# Patient Record
Sex: Female | Born: 1950
Health system: Southern US, Community
[De-identification: ages and names within clinical notes are randomized; demographics above are authoritative.]

## PROBLEM LIST (undated history)

## (undated) DIAGNOSIS — T4145XA Adverse effect of unspecified anesthetic, initial encounter: Secondary | ICD-10-CM

## (undated) DIAGNOSIS — F32A Depression, unspecified: Secondary | ICD-10-CM

## (undated) DIAGNOSIS — B3323 Viral pericarditis: Secondary | ICD-10-CM

## (undated) DIAGNOSIS — I509 Heart failure, unspecified: Secondary | ICD-10-CM

## (undated) DIAGNOSIS — K469 Unspecified abdominal hernia without obstruction or gangrene: Secondary | ICD-10-CM

## (undated) DIAGNOSIS — T8859XA Other complications of anesthesia, initial encounter: Secondary | ICD-10-CM

## (undated) DIAGNOSIS — I10 Essential (primary) hypertension: Secondary | ICD-10-CM

## (undated) DIAGNOSIS — M1990 Unspecified osteoarthritis, unspecified site: Secondary | ICD-10-CM

## (undated) DIAGNOSIS — F329 Major depressive disorder, single episode, unspecified: Secondary | ICD-10-CM

## (undated) DIAGNOSIS — G4730 Sleep apnea, unspecified: Secondary | ICD-10-CM

## (undated) DIAGNOSIS — E039 Hypothyroidism, unspecified: Secondary | ICD-10-CM

## (undated) DIAGNOSIS — F319 Bipolar disorder, unspecified: Secondary | ICD-10-CM

## (undated) DIAGNOSIS — E785 Hyperlipidemia, unspecified: Secondary | ICD-10-CM

## (undated) DIAGNOSIS — F419 Anxiety disorder, unspecified: Secondary | ICD-10-CM

## (undated) HISTORY — DX: Anxiety disorder, unspecified: F41.9

## (undated) HISTORY — DX: Depression, unspecified: F32.A

## (undated) HISTORY — DX: Heart failure, unspecified: I50.9

## (undated) HISTORY — PX: APPENDECTOMY: SHX54

## (undated) HISTORY — DX: Hyperlipidemia, unspecified: E78.5

## (undated) HISTORY — DX: Major depressive disorder, single episode, unspecified: F32.9

## (undated) HISTORY — DX: Unspecified abdominal hernia without obstruction or gangrene: K46.9

## (undated) HISTORY — PX: ABDOMINAL HYSTERECTOMY: SHX81

## (undated) HISTORY — DX: Essential (primary) hypertension: I10

## (undated) HISTORY — DX: Unspecified osteoarthritis, unspecified site: M19.90

## (undated) HISTORY — PX: FOOT SURGERY: SHX648

## (undated) HISTORY — DX: Bipolar disorder, unspecified: F31.9

## (undated) HISTORY — DX: Viral pericarditis: B33.23

---

## 1982-08-22 HISTORY — PX: CHOLECYSTECTOMY: SHX55

## 2003-07-24 ENCOUNTER — Emergency Department (HOSPITAL_COMMUNITY): Admission: EM | Admit: 2003-07-24 | Discharge: 2003-07-24 | Payer: Self-pay

## 2003-07-29 ENCOUNTER — Inpatient Hospital Stay (HOSPITAL_COMMUNITY): Admission: EM | Admit: 2003-07-29 | Discharge: 2003-08-01 | Payer: Self-pay | Admitting: Psychiatry

## 2003-08-06 ENCOUNTER — Other Ambulatory Visit (HOSPITAL_COMMUNITY): Admission: RE | Admit: 2003-08-06 | Discharge: 2003-08-12 | Payer: Self-pay | Admitting: Psychiatry

## 2003-08-25 ENCOUNTER — Emergency Department (HOSPITAL_COMMUNITY): Admission: EM | Admit: 2003-08-25 | Discharge: 2003-08-25 | Payer: Self-pay | Admitting: Emergency Medicine

## 2003-09-08 ENCOUNTER — Encounter: Admission: RE | Admit: 2003-09-08 | Discharge: 2003-09-08 | Payer: Self-pay | Admitting: Family Medicine

## 2003-09-15 ENCOUNTER — Ambulatory Visit (HOSPITAL_BASED_OUTPATIENT_CLINIC_OR_DEPARTMENT_OTHER): Admission: RE | Admit: 2003-09-15 | Discharge: 2003-09-15 | Payer: Self-pay | Admitting: Family Medicine

## 2004-10-21 ENCOUNTER — Encounter: Admission: RE | Admit: 2004-10-21 | Discharge: 2004-10-21 | Payer: Self-pay | Admitting: Family Medicine

## 2005-11-04 ENCOUNTER — Inpatient Hospital Stay (HOSPITAL_COMMUNITY): Admission: EM | Admit: 2005-11-04 | Discharge: 2005-11-05 | Payer: Self-pay | Admitting: Emergency Medicine

## 2005-11-05 ENCOUNTER — Inpatient Hospital Stay (HOSPITAL_COMMUNITY): Admission: AD | Admit: 2005-11-05 | Discharge: 2005-11-08 | Payer: Self-pay | Admitting: Psychiatry

## 2005-11-06 ENCOUNTER — Ambulatory Visit: Payer: Self-pay | Admitting: Psychiatry

## 2006-05-17 ENCOUNTER — Ambulatory Visit: Payer: Self-pay | Admitting: Internal Medicine

## 2006-05-17 ENCOUNTER — Inpatient Hospital Stay (HOSPITAL_COMMUNITY): Admission: EM | Admit: 2006-05-17 | Discharge: 2006-05-21 | Payer: Self-pay | Admitting: Emergency Medicine

## 2006-05-17 ENCOUNTER — Encounter: Payer: Self-pay | Admitting: Cardiology

## 2006-05-22 ENCOUNTER — Encounter: Payer: Self-pay | Admitting: Internal Medicine

## 2006-05-22 ENCOUNTER — Ambulatory Visit: Payer: Self-pay

## 2006-05-23 ENCOUNTER — Ambulatory Visit: Payer: Self-pay | Admitting: *Deleted

## 2006-05-25 ENCOUNTER — Inpatient Hospital Stay (HOSPITAL_COMMUNITY): Admission: EM | Admit: 2006-05-25 | Discharge: 2006-05-30 | Payer: Self-pay | Admitting: Emergency Medicine

## 2006-05-25 ENCOUNTER — Ambulatory Visit: Payer: Self-pay | Admitting: Cardiology

## 2006-05-26 ENCOUNTER — Encounter: Payer: Self-pay | Admitting: Cardiology

## 2006-05-29 ENCOUNTER — Encounter: Payer: Self-pay | Admitting: Cardiovascular Disease

## 2006-06-03 ENCOUNTER — Ambulatory Visit: Payer: Self-pay | Admitting: Internal Medicine

## 2006-06-03 ENCOUNTER — Inpatient Hospital Stay (HOSPITAL_COMMUNITY): Admission: EM | Admit: 2006-06-03 | Discharge: 2006-06-14 | Payer: Self-pay | Admitting: *Deleted

## 2006-06-04 ENCOUNTER — Encounter (INDEPENDENT_AMBULATORY_CARE_PROVIDER_SITE_OTHER): Payer: Self-pay | Admitting: *Deleted

## 2006-06-06 ENCOUNTER — Encounter (INDEPENDENT_AMBULATORY_CARE_PROVIDER_SITE_OTHER): Payer: Self-pay | Admitting: *Deleted

## 2006-06-27 ENCOUNTER — Ambulatory Visit: Payer: Self-pay | Admitting: Internal Medicine

## 2006-07-05 ENCOUNTER — Ambulatory Visit: Payer: Self-pay | Admitting: Internal Medicine

## 2006-07-05 ENCOUNTER — Encounter: Payer: Self-pay | Admitting: Internal Medicine

## 2006-07-05 ENCOUNTER — Ambulatory Visit: Payer: Self-pay

## 2006-07-12 ENCOUNTER — Ambulatory Visit: Payer: Self-pay | Admitting: Internal Medicine

## 2006-07-20 ENCOUNTER — Ambulatory Visit: Payer: Self-pay | Admitting: Internal Medicine

## 2006-07-27 ENCOUNTER — Ambulatory Visit: Payer: Self-pay | Admitting: Internal Medicine

## 2006-09-01 ENCOUNTER — Ambulatory Visit: Payer: Self-pay | Admitting: Internal Medicine

## 2006-09-01 LAB — CONVERTED CEMR LAB
ALT: 16 units/L (ref 0–40)
AST: 18 units/L (ref 0–37)
Albumin: 3.9 g/dL (ref 3.5–5.2)
Alkaline Phosphatase: 92 units/L (ref 39–117)
BUN: 16 mg/dL (ref 6–23)
Basophils Relative: 1.3 % — ABNORMAL HIGH (ref 0.0–1.0)
CO2: 30 meq/L (ref 19–32)
Calcium: 9.6 mg/dL (ref 8.4–10.5)
GFR calc non Af Amer: 70 mL/min
Glomerular Filtration Rate, Af Am: 84 mL/min/{1.73_m2}
Hemoglobin: 13.5 g/dL (ref 12.0–15.0)
MCHC: 33.3 g/dL (ref 30.0–36.0)
Monocytes Absolute: 0.3 10*3/uL (ref 0.2–0.7)
Monocytes Relative: 5.2 % (ref 3.0–11.0)
Platelets: 316 10*3/uL (ref 150–400)
Potassium: 4.5 meq/L (ref 3.5–5.1)
RBC: 5 M/uL (ref 3.87–5.11)
RDW: 17.4 % — ABNORMAL HIGH (ref 11.5–14.6)
Sed Rate: 29 mm/hr — ABNORMAL HIGH (ref 0–25)
Total Bilirubin: 0.7 mg/dL (ref 0.3–1.2)
Total Protein: 7.2 g/dL (ref 6.0–8.3)

## 2007-01-01 ENCOUNTER — Ambulatory Visit: Payer: Self-pay | Admitting: Internal Medicine

## 2007-02-05 ENCOUNTER — Ambulatory Visit: Payer: Self-pay | Admitting: Internal Medicine

## 2007-02-05 ENCOUNTER — Ambulatory Visit: Payer: Self-pay

## 2007-08-21 ENCOUNTER — Ambulatory Visit: Payer: Self-pay | Admitting: Cardiology

## 2007-09-04 ENCOUNTER — Encounter: Payer: Self-pay | Admitting: Internal Medicine

## 2007-09-04 ENCOUNTER — Ambulatory Visit: Payer: Self-pay

## 2007-09-06 ENCOUNTER — Ambulatory Visit: Payer: Self-pay | Admitting: Internal Medicine

## 2007-09-21 ENCOUNTER — Ambulatory Visit: Payer: Self-pay | Admitting: Internal Medicine

## 2008-12-01 ENCOUNTER — Telehealth: Payer: Self-pay | Admitting: Internal Medicine

## 2009-02-13 ENCOUNTER — Telehealth: Payer: Self-pay | Admitting: Internal Medicine

## 2009-02-13 ENCOUNTER — Ambulatory Visit: Payer: Self-pay | Admitting: Cardiology

## 2009-02-13 ENCOUNTER — Encounter: Payer: Self-pay | Admitting: Cardiology

## 2009-02-13 ENCOUNTER — Inpatient Hospital Stay (HOSPITAL_COMMUNITY): Admission: EM | Admit: 2009-02-13 | Discharge: 2009-02-15 | Payer: Self-pay | Admitting: Emergency Medicine

## 2009-02-13 ENCOUNTER — Ambulatory Visit: Payer: Self-pay | Admitting: Internal Medicine

## 2009-02-13 ENCOUNTER — Encounter: Payer: Self-pay | Admitting: Internal Medicine

## 2009-02-14 ENCOUNTER — Encounter: Payer: Self-pay | Admitting: Cardiovascular Disease

## 2009-02-16 ENCOUNTER — Encounter: Payer: Self-pay | Admitting: Cardiology

## 2009-03-02 ENCOUNTER — Telehealth: Payer: Self-pay | Admitting: Internal Medicine

## 2009-05-22 DIAGNOSIS — B3323 Viral pericarditis: Secondary | ICD-10-CM

## 2009-05-22 HISTORY — DX: Viral pericarditis: B33.23

## 2009-06-09 ENCOUNTER — Encounter: Admission: RE | Admit: 2009-06-09 | Discharge: 2009-06-09 | Payer: Self-pay | Admitting: Family Medicine

## 2009-09-29 ENCOUNTER — Telehealth: Payer: Self-pay | Admitting: Internal Medicine

## 2010-09-21 NOTE — Progress Notes (Signed)
Summary: refill  Phone Note Refill Request Message from:  Patient on September 29, 2009 8:50 AM  Refills Requested: Medication #1:  KLOR-CON M20 20 MEQ CR-TABS 1 once daily.   Supply Requested: 3 months [facility]   Method Requested: Fax to [facility] Initial call taken by: Migdalia Dk,  September 29, 2009 8:50 AM  Follow-up for Phone Call        pt will call PCP to get this refilled, has not seen [place] since 08/2007 and states she is not having problems so she agrees to call them. Follow-up by: Hardin Negus, RMA,  September 29, 2009 1:59 PM

## 2010-10-22 DIAGNOSIS — I4891 Unspecified atrial fibrillation: Secondary | ICD-10-CM | POA: Insufficient documentation

## 2010-10-22 DIAGNOSIS — I480 Paroxysmal atrial fibrillation: Secondary | ICD-10-CM | POA: Insufficient documentation

## 2010-10-22 DIAGNOSIS — E782 Mixed hyperlipidemia: Secondary | ICD-10-CM | POA: Insufficient documentation

## 2010-10-22 DIAGNOSIS — I10 Essential (primary) hypertension: Secondary | ICD-10-CM | POA: Insufficient documentation

## 2010-10-22 DIAGNOSIS — E785 Hyperlipidemia, unspecified: Secondary | ICD-10-CM | POA: Insufficient documentation

## 2010-10-22 DIAGNOSIS — R079 Chest pain, unspecified: Secondary | ICD-10-CM | POA: Insufficient documentation

## 2010-10-25 ENCOUNTER — Ambulatory Visit (INDEPENDENT_AMBULATORY_CARE_PROVIDER_SITE_OTHER): Payer: BC Managed Care – PPO | Admitting: Internal Medicine

## 2010-10-25 ENCOUNTER — Encounter: Payer: Self-pay | Admitting: Internal Medicine

## 2010-10-25 DIAGNOSIS — R0602 Shortness of breath: Secondary | ICD-10-CM

## 2010-11-02 ENCOUNTER — Other Ambulatory Visit (HOSPITAL_COMMUNITY): Payer: BC Managed Care – PPO

## 2010-11-02 NOTE — Assessment & Plan Note (Signed)
Summary: dyspena/ dr ehinger [phone]/bcbs/pt [phone] or [phone]/[id number]   Visit Type:  Follow-up  CC:  cardiology consult.  History of Present Illness: Ms. Lal is a 60 y/o woman with PMHx notable for:  1. Diabetes. 2. Hypertension. 3. Morbid obesity. 4. History of pleuropericarditis in 2007, status post VATS and     pericardial window. 5. Negative left heart catheterization in 2007. 6. Bipolar disorder with disability. 7. History of paroxysmal atrial fib on previous admissions. 8. Asthma. 9. Sleep apnea with CPAP. 10.Dyslipidemia. 11.Anxiety disorder. 12.History of irritable bowel syndrome.  We saw her last in January 2009. She is now referred back by Dr. Manus Gunning for possible stress test dypnea. Says after we saw her previously she felt well but over past few months has been getting more SOB.  She says this correlates with weight gain of about 30 pounds. Says she can walk about 1/2 mile before she has to stop. Denies CP. Does have LE edema and is now on scheduled diuretic. Very compliant with CPAP. No cough, fevers or chills. Not exercising regularly. BP well controlled at home.   Current Medications (verified): 1)  Furosemide 20 Mg Tabs (Furosemide) .... As Needed 2)  Klor-Con M20 20 Meq Cr-Tabs (Potassium Chloride Crys Cr) .[name].. 1 Once Daily 3)  Lisinopril 20 Mg Tabs (Lisinopril) .... Take One Tablet By Mouth Daily 4)  Cozaar 100 Mg Tabs (Losartan Potassium) .... Take 1 Tablet By Mouth Once A Day 5)  Simvastatin 40 Mg Tabs (Simvastatin) .... Take One Tablet By Mouth Daily At Bedtime 6)  Etodolac 400 Mg Tabs (Etodolac) .... Two Times A Day As Needed 7)  Ambien 10 Mg Tabs (Zolpidem Tartrate) .... As Needed 8)  Xanax 1 Mg Tabs (Alprazolam) .... As Needed 9)  Xanax Xr 1 Mg Xr24h-Tab (Alprazolam) .... Two Times A Day 10)  Aspirin 81 Mg Tbec (Aspirin) .... Take One Tablet By Mouth Daily 11)  Singulair 10 Mg Tabs (Montelukast Sodium) .... Take 1 Tablet By Mouth Once A Day 12)   Risperdal 2 Mg Tabs (Risperidone) .[name].. 1 Am and 2 Pm 13)  Seroquel 200 Mg Tabs (Quetiapine Fumarate) .... Take 1 Tab By Mouth At Bedtime 14)  Lamictal 150 Mg Tabs (Lamotrigine) .[name].. 1 1/2 Tablet Two Times A Day  Allergies (verified): No Known Drug Allergies  Past History:  Past Medical History: Last updated: 10/22/2010 1. Pleuropericarditis 2. Diabetes Type 2 3. Hypertension 4. Morbid obesity 5. Bipolar disorder 6. Atrial Fibrillation 7. Chest Pain 8. Asthma 9. Sleep apnea with CPAP 10. Anxiety 11.Dyslipidemia. 12.History of irritable bowel syndrome  Past Surgical History: Last updated: 10/22/2010 Cardiac Catheterization  Family History: Last updated: 10/22/2010 adopted  Social History: Last updated: 10/22/2010 Disabled  Married  Tobacco Use - No.  Alcohol Use - no  Risk Factors: Smoking Status: never (10/22/2010)  Family History: Reviewed history from 10/22/2010 and no changes required. adopted  Social History: Reviewed history from 10/22/2010 and no changes required. Disabled  Married  Tobacco Use - No.  Alcohol Use - no  Review of Systems       As per HPI and past medical history; otherwise all systems negative.   Vital Signs:  Patient profile:   60 year old female Height:      65 inches Weight:      286.75 pounds BMI:     47.89 Pulse rate:   84 / minute Pulse rhythm:   regular Resp:     20 per minute BP sitting:   160 /  98  (left arm) Cuff size:   large  Vitals Entered By: Vikki Ports (October 25, 2010 2:59 PM)  Physical Exam  General:  Morbidly obese no resp difficulty HEENT: normal thick supple. no JVD. Carotids 2+ bilat; no bruits. No lymphadenopathy or thryomegaly appreciated. Cor: PMI nondisplaced. Regular rate & rhythm. No rubs, gallops, murmur. Lungs: clear Abdomen: obese soft, nontender, nondistended. No hepatosplenomegaly. No bruits or masses. Good bowel sounds. Extremities: no cyanosis, clubbing, rash, tr edema L>R Neuro:  alert & orientedx3, cranial nerves grossly intact. moves all 4 extremities w/o difficulty. affect pleasant    Impression & Recommendations:  Problem # 1:  DYSPNEA (ICD-786.05) I suspect this is mostly due to weight gain but she has a strong family history of CAD. Will get Lexiscan Myvoview (unable to walk well on treadmill). Also get echo to rule out recurrent effusion and PAH. Encouraged to lose weight.   Orders: EKG w/ Interpretation (93000) Nuclear Stress Test (Nuc Stress Test) Echocardiogram (Echo)  Patient Instructions: 1)  Your physician has requested that you have an echocardiogram.  Echocardiography is a painless test that uses sound waves to create images of your heart. It provides your doctor with information about the size and shape of your heart and how well your heart's chambers and valves are working.  This procedure takes approximately one hour. There are no restrictions for this procedure. 2)  Your physician has requested that you have a Lexiscan myoview.  For further information please visit [email or web address]  Please follow instruction sheet, as given. 3)  Your physician wants you to follow-up in: 6 months.  You will receive a reminder letter in the mail two months in advance. If you don't receive a letter, please call our office to schedule the follow-up appointment.

## 2010-11-03 ENCOUNTER — Telehealth (INDEPENDENT_AMBULATORY_CARE_PROVIDER_SITE_OTHER): Payer: Self-pay | Admitting: *Deleted

## 2010-11-04 ENCOUNTER — Encounter: Payer: Self-pay | Admitting: Cardiovascular Disease

## 2010-11-04 ENCOUNTER — Ambulatory Visit (HOSPITAL_BASED_OUTPATIENT_CLINIC_OR_DEPARTMENT_OTHER): Payer: BC Managed Care – PPO

## 2010-11-04 ENCOUNTER — Ambulatory Visit (HOSPITAL_COMMUNITY): Payer: BC Managed Care – PPO | Attending: Internal Medicine

## 2010-11-04 ENCOUNTER — Encounter (INDEPENDENT_AMBULATORY_CARE_PROVIDER_SITE_OTHER): Payer: Self-pay

## 2010-11-04 DIAGNOSIS — R0609 Other forms of dyspnea: Secondary | ICD-10-CM

## 2010-11-04 DIAGNOSIS — R0989 Other specified symptoms and signs involving the circulatory and respiratory systems: Secondary | ICD-10-CM

## 2010-11-04 DIAGNOSIS — R609 Edema, unspecified: Secondary | ICD-10-CM

## 2010-11-04 DIAGNOSIS — R0602 Shortness of breath: Secondary | ICD-10-CM | POA: Insufficient documentation

## 2010-11-04 DIAGNOSIS — I4891 Unspecified atrial fibrillation: Secondary | ICD-10-CM

## 2010-11-09 ENCOUNTER — Ambulatory Visit (HOSPITAL_COMMUNITY): Payer: BC Managed Care – PPO | Attending: Internal Medicine | Admitting: Radiology

## 2010-11-09 ENCOUNTER — Encounter (HOSPITAL_COMMUNITY): Payer: BC Managed Care – PPO

## 2010-11-09 DIAGNOSIS — R0789 Other chest pain: Secondary | ICD-10-CM

## 2010-11-09 DIAGNOSIS — R0602 Shortness of breath: Secondary | ICD-10-CM

## 2010-11-09 NOTE — Progress Notes (Signed)
Summary: Nuclear Pre-Procedure  Phone Note Outgoing Call Call back at [facility] Phone [phone]   Call placed by: Stanton Kidney, EMT-P,  November 03, 2010 11:42 AM Call placed to: Patient Action Taken: Phone Call Completed Summary of Call: Reviewed information on Myoview Information Sheet (see scanned document for further details).  Spoke with the patient. Stanton Kidney, EMT-P  November 03, 2010 11:42 AM      Nuclear Med Background Indications for Stress Test: Evaluation for Ischemia   History: Asthma, Echo, Heart Catheterization  History Comments: '07 Heart Cath: (-) 6/10 Echo: EF=60-65%  Symptoms: Chest Pain, DOE, SOB    Nuclear Pre-Procedure Cardiac Risk Factors: Hypertension, Lipids, NIDDM, Obesity Height (in): 65

## 2010-11-09 NOTE — Miscellaneous (Signed)
Summary: Twitching bilateral jaw area after Definity.  Clinical Lists Changes  Allergies: Added new allergy or adverse reaction of * DEFINITY Observations: Added new observation of ALLERGY REV: Done (11/04/2010 12:23) Added new observation of NKA: F (11/04/2010 12:23)    The patient had an Echo with Definity 1.5 ml vial (Lot # [id number], Exp. 01-21-11)mixed with preservative-free NACL Infusion started at 8:20am with 36 ml infused, and finished at 8:25 am per Philomena Course, Echo, RDMS.The patient had lexiscan myoview study about one hour later. Then, after the lexiscan the patient told the Stress Tech that she had a twitching sensation of the jaw area bilateral which the patient said started after the definity but the patient did not mention it to the Echo sonographer.Of note,the patient complained of jaw, facial pain, and headache after the lexiscan was given.Vitals post myoview test were 140/90 P76 O2 Sat 97% RA and the twitching was resolving and only intermitent per patient.No twitching visually noted.Dr. Tommie Sams notified of patients symptoms and reviewed the patient myoview images.Notified [facility] safetydepartment at [phone]. The patient was discharged after symptoms subsided. Clementine Soulliere,RN.

## 2010-11-11 ENCOUNTER — Other Ambulatory Visit (HOSPITAL_COMMUNITY): Payer: BC Managed Care – PPO

## 2010-11-18 NOTE — Assessment & Plan Note (Signed)
Summary: Cardiology Nuclear Testing  Nuclear Med Background Indications for Stress Test: Evaluation for Ischemia   History: Asthma, Echo, Heart Catheterization  History Comments: '07 Cath:normal; '10 Echo:EF=60-65%; h/o PAF  Symptoms: DOE, Palpitations, Rapid HR, SOB    Nuclear Pre-Procedure Cardiac Risk Factors: Family History - CAD, Hypertension, Lipids, NIDDM, Obesity Caffeine/Decaff Intake: none NPO After: 7:00 PM Lungs: Clear.  O2 Sat 99% on RA. IV 0.9% NS with Angio Cath: 22g     IV Site: L Forearm IV Started by: Cathlyn Parsons, RN Chest Size (in) 48     Cup Size DD     Height (in): 65 Weight (lb): 290 BMI: 48.43  Nuclear Med Study 1 or 2 day study:  2 day     Stress Test Type:  Eugenie Birks Reading MD:  Charlton Haws, MD     Referring MD:  Arvilla Meres, MD Resting Radionuclide:  Technetium 39m Tetrofosmin     Resting Radionuclide Dose:  33 mCi  Stress Radionuclide:  Technetium 95m Tetrofosmin     Stress Radionuclide Dose:  33 mCi   Stress Protocol   Lexiscan: 0.4 mg   Stress Test Technologist:  Rea College, CMA-N     Nuclear Technologist:  Doyne Keel, CNMT  Rest Procedure  Myocardial perfusion imaging was performed at rest 45 minutes following the intravenous administration of Technetium 14m Tetrofosmin.  Stress Procedure  The patient received IV Lexiscan 0.4 mg over 15-seconds.  Technetium 10m Tetrofosmin injected at 30-seconds.  There were no significant changes with infusion.  Quantitative spect images were obtained after a 45 minute delay.  QPS Raw Data Images:  Normal; no motion artifact; normal heart/lung ratio. Stress Images:  Normal homogeneous uptake in all areas of the myocardium. Rest Images:  Normal homogeneous uptake in all areas of the myocardium. Subtraction (SDS):  Normal Transient Ischemic Dilatation:  0.98  (Normal <1.22)  Lung/Heart Ratio:  0.33  (Normal <0.45)  Quantitative Gated Spect Images QGS EDV:  65 ml QGS ESV:  16 ml QGS  EF:  75 % QGS cine images:  normal  Findings Normal nuclear study      Overall Impression  Exercise Capacity: Lexiscan with no exercise. BP Response: Normal blood pressure response. Clinical Symptoms: Headache ECG Impression: No significant ST segment change suggestive of ischemia. Overall Impression: Normal stress nuclear study.  Appended Document: Cardiology Nuclear Testing pt aware  Appended Document: Cardiology Nuclear Testing normal

## 2010-11-29 LAB — LIPID PANEL
Cholesterol: 159 mg/dL (ref 0–200)
LDL Cholesterol: 93 mg/dL (ref 0–99)

## 2010-11-29 LAB — COMPREHENSIVE METABOLIC PANEL
Alkaline Phosphatase: 86 U/L (ref 39–117)
BUN: 10 mg/dL (ref 6–23)
Chloride: 107 mEq/L (ref 96–112)
Creatinine, Ser: 0.96 mg/dL (ref 0.4–1.2)
Glucose, Bld: 125 mg/dL — ABNORMAL HIGH (ref 70–99)
Potassium: 4.1 mEq/L (ref 3.5–5.1)
Total Bilirubin: 0.7 mg/dL (ref 0.3–1.2)
Total Protein: 6.6 g/dL (ref 6.0–8.3)

## 2010-11-29 LAB — DIFFERENTIAL
Basophils Absolute: 0.1 10*3/uL (ref 0.0–0.1)
Basophils Relative: 1 % (ref 0–1)
Lymphocytes Relative: 25 % (ref 12–46)
Neutro Abs: 3.8 10*3/uL (ref 1.7–7.7)
Neutrophils Relative %: 67 % (ref 43–77)

## 2010-11-29 LAB — GLUCOSE, CAPILLARY
Glucose-Capillary: 124 mg/dL — ABNORMAL HIGH (ref 70–99)
Glucose-Capillary: 154 mg/dL — ABNORMAL HIGH (ref 70–99)
Glucose-Capillary: 169 mg/dL — ABNORMAL HIGH (ref 70–99)
Glucose-Capillary: 90 mg/dL (ref 70–99)

## 2010-11-29 LAB — D-DIMER, QUANTITATIVE
D-Dimer, Quant: 0.49 ug/mL-FEU — ABNORMAL HIGH (ref 0.00–0.48)
D-Dimer, Quant: 0.67 ug/mL-FEU — ABNORMAL HIGH (ref 0.00–0.48)

## 2010-11-29 LAB — CARDIAC PANEL(CRET KIN+CKTOT+MB+TROPI)
CK, MB: 1.2 ng/mL (ref 0.3–4.0)
Relative Index: INVALID (ref 0.0–2.5)
Total CK: 85 U/L (ref 7–177)
Troponin I: 0.02 ng/mL (ref 0.00–0.06)

## 2010-11-29 LAB — TROPONIN I: Troponin I: 0.01 ng/mL (ref 0.00–0.06)

## 2010-11-29 LAB — CBC
Hemoglobin: 12.5 g/dL (ref 12.0–15.0)
MCHC: 34 g/dL (ref 30.0–36.0)
Platelets: 241 10*3/uL (ref 150–400)
RDW: 14.8 % (ref 11.5–15.5)

## 2010-11-29 LAB — BASIC METABOLIC PANEL
Calcium: 9.1 mg/dL (ref 8.4–10.5)
Creatinine, Ser: 0.9 mg/dL (ref 0.4–1.2)
GFR calc Af Amer: >60 mL/min (ref >60)
GFR calc non Af Amer: >60 mL/min (ref >60)
Sodium: 142 mEq/L (ref 135–145)

## 2010-11-29 LAB — CK TOTAL AND CKMB (NOT AT ARMC)
CK, MB: 1.5 ng/mL (ref 0.3–4.0)
Total CK: 96 U/L (ref 7–177)

## 2010-11-29 LAB — TSH: TSH: 8.025 u[IU]/mL — ABNORMAL HIGH (ref 0.350–4.500)

## 2010-11-29 LAB — BRAIN NATRIURETIC PEPTIDE: Pro B Natriuretic peptide (BNP): 50 pg/mL (ref 0.0–100.0)

## 2011-01-04 NOTE — Assessment & Plan Note (Signed)
[facility]                            CARDIOLOGY OFFICE NOTE   [name], [name]                      MRN:          [id number]  DATE:08/21/2007                            DOB:          1950-11-08    The primary is Bryan Lemma. Manus Gunning, M.D.   REASON FOR PRESENTATION:  Evaluate patient with shortness of breath and  chest pain.   The patient was an add-on today for evaluation of the above complaints.  She typically sees Dr. Gala Romney.  She had pleural pericarditis  requiring therapy as described below in 2007.  Her last echocardiogram  in June 2008 demonstrated an EF of 55-60% with no evidence of recurrent  effusion.  She apparently has some chronic chest pain issues.  She had a  cardiac catheterization in 2007 that demonstrated no coronary disease.   She returns today because she has had some chest discomfort.  This is  happening sporadically.  It is not happening daily.  It seems to be at  rest.  She cannot bring it on.  Her most exerting activity is climbing  the driveway at her home.  This does not bring on any chest discomfort;  however, she has had progressive shortness of breath.  She says this is  slowly increasing.  She is not having any PND or orthopnea.  She has had  some increased lower extremity swelling.   Her chest discomfort has been substernal and left arm pain.  It has been  dull.  It goes away with Aleve.  However, it does seem to be similar to  previous complaints that she has had, though it may be slightly more  intense.   The patient is not having any fevers, chills or cough.  She has been  eating quite a bit and has gained weight.  She has been under a lot of  stress as her husband has a threat of losing her job.   PAST MEDICAL HISTORY:  1. Pleural pericarditis requiring VATS with drainage of the pleural      effusion and a pericardial window.  2. Diabetes.  3. Hypertension.  4. Sleep apnea with CPAP.  5. Bipolar  affective disorder.  6. Hyperlipidemia.  7. Asthma.  8. Morbid obesity.  9. Urinary tract infections.  10.Anxiety disorder.  11.Irritable bowel syndrome.  12.Previous paroxysmal atrial fibrillation.  13.Status post thoracentesis.  14.Status post subxiphoid pericardial window and left VATS procedure.   ALLERGIES:  None.   CURRENT MEDICATIONS:  1. Xanax.  2. Aspirin 81 mg daily.  3. Lamictal.  4. Seroquel 400 mg q.a.m. and 600 mg q.p.m.  5. Cozaar 100 mg daily.  6. Alprazolam.  7. Zolpidem.  8. K-Dur 20 mEq daily.  9. Zocor 40 mg daily.  10.Singulair 10 mg daily.   REVIEW OF SYSTEMS:  As stated in the HPI and otherwise negative for  other systems.   PHYSICAL EXAMINATION:  The patient is in no acute distress.  Blood pressure 146/87, heart rate 116 and regular, weight 310 pounds,  body mass index 54.  HEENT:  Eyelids unremarkable.  Pupils equal,  round, and reactive to  light.  Fundi not visualized.  Oral mucosa unremarkable.  NECK:  No jugular venous distention at 45 degrees.  Carotid upstroke  brisk and symmetric.  No bruits, no thyromegaly.  LYMPHATIC:  No adenopathy.  LUNGS:  Clear to auscultation bilaterally.  BACK:  No costovertebral angle tenderness.  CHEST:  Unremarkable.  HEART:  PMI not displaced or sustained, S1 and S2 within normal limits,  no S3, no S4, no clicks, no rubs, no murmurs.  ABDOMEN:  Morbidly obese, positive bowel sounds, normal in frequency and  pitch.  No bruits, no rebound, no guarding, no midline pulsatile mass,  no organomegaly.  SKIN:  No rashes, no nodules.  EXTREMITIES:  2+ pulses, no edema, no cyanosis, no clubbing.  NEUROLOGIC:  Oriented to person, place and time.  Cranial nerves II-XII  grossly intact.  Motor grossly intact.   EKG:  Sinus tachycardia, rate 16, right axis deviation, borderline low  voltage in the limb leads, low voltage in the precordial leads, the rate  is increased compared to the previous.  The voltage is slightly  less.   ASSESSMENT AND PLAN:  1. Dyspnea.  This is probably the most predominant complaint when you      begin to try to get at the reasoning for this presentation.  She      does have some tachycardia and slight change to the voltage on her      EKG.  I do not appreciate any rub or pulsus paradox.  However, I do      believe a repeat echocardiogram is in order to make sure she has      not had any recurrent pericardial effusion.  2. Chest discomfort.  This is atypical and similar to her previous      pattern.  She had no coronary disease in 2007 on catheterization.      I think it is unlikely that this is an anginal equivalent.  She      will have the evaluation of her pericardium as above.  Otherwise      she can follow up with Dr. Manus Gunning for nonanginal or noncardiac      discomfort if this continues.  3. Morbid obesity.  She is going to try to go back to losing weight      with diet as she has been off of her diet.  4. Anxiety and depression.  She has been under quite a bit of stress      and will continue medications as listed and follow with her primary      care Tashika Goodin.   FOLLOW-UP:  We will get her back to see Dr. Gala Romney the next available  appointment after the echocardiogram.     Rollene Rotunda, MD, [facility]  Electronically Signed    JH/MedQ  DD: 08/21/2007  DT: 08/22/2007  Job #: [id number]   cc:   Bryan Lemma. Manus Gunning, M.D.

## 2011-01-04 NOTE — H&P (Signed)
NAMELORAYNE, GETCHELL NO.:  [id number]   MEDICAL RECORD NO.:  [id number]          PATIENT TYPE:  INP   LOCATION:  2013                         FACILITY:  [facility]   PHYSICIAN:  Marca Ancona, MD      DATE OF BIRTH:  [birth year]   DATE OF ADMISSION:  02/13/2009  DATE OF DISCHARGE:                              HISTORY & PHYSICAL   PRIMARY CARDIOLOGIST:  Bevelyn Buckles. Bensimhon, MD   HISTORY OF PRESENT ILLNESS:  This is a 60 year old with history of  diabetes, hypertension, and morbid obesity who presented with chest pain  today.  She had an episode of pleuropericarditis in 2007 with VATS and  pericardial window.  She had a negative left heart catheterization in  2007 as well.  Other than last month, she has had occasional  nonexertional, nonpleuritic episodes of left-sided deep aching chest  pain radiating to the left shoulder.  She cannot think of anything in  particular that brings on these chest pain episodes.  Today, she had an  episode that was severe and did not resolve.  It was not associated with  exertion.  It is not pleuritic.  It lasted several hours and finally  subsided when she took two aspirin tablets.  She came to the emergency  department at that point.   PAST MEDICAL HISTORY:  1. Diabetes.  2. Hypertension.  3. Morbid obesity.  4. Bipolar disorder.  5. Pleuropericarditis.  This was found in 2007, was associated with      elevated sed rate.  She had a VATS with drainage of a large      recurrent left pleural effusion, as well as a pericardial window.  6. Left heart catheterization in 2007 with no angiographic coronary      artery disease.   SOCIAL HISTORY:  The patient lives with her husband.  She is disabled.  She does not smoke or drink any alcohol.   FAMILY HISTORY:  The patient is adopted.  We do not know her biological  family history.   REVIEW OF SYSTEMS:  All systems were reviewed and were negative except  as noted in history of present  illness.   MEDICATIONS:  Seroquel XR 800 mg a day, Lamictal, Xanax, lisinopril 40  mg daily, aspirin 81 mg daily, Zocor 40 mg daily.   EKG shows low voltage, normal sinus rhythm, is actually no change from  prior.  Echocardiogram done today in the emergency department shows no  pericardial effusion.  There is no significant valvular abnormality.  LV  systolic function appears normal.  This is a technically difficult study  due to the patient's body habitus.   LABORATORY DATA:  White count 5.7, hematocrit 36.8, potassium 4.1,  creatinine 0.96.  First set of cardiac enzymes is negative.   PHYSICAL EXAMINATION:  GENERAL:  The patient is afebrile.  This is an  obese female in no apparent distress.  VITAL SIGNS:  Pulse is 83, blood pressure 130/66, oxygen saturation 95%  on room air.  NEUROLOGIC:  Alert and oriented x3, normal affect.  HEENT:  Normal exam.  NECK:  There is no JVD.  There is no thyromegaly or thyroid nodule.  CARDIOVASCULAR:  Heart regular S1 and S2.  No S3, no S4.  There is no  murmur.  EXTREMITIES:  There is no peripheral edema.  There are 2+ posterior  tibial pulses bilaterally.  LUNGS:  Clear to auscultation bilaterally with normal respiratory  effort.  ABDOMEN:  Soft, obese, nontender.  No hepatosplenomegaly.  EXTREMITIES:  No clubbing or cyanosis.  MUSCULOSKELETAL:  Normal exam.  SKIN:  Normal exam.   ASSESSMENT/PLAN:  This is a 60 year old with history of  pleuropericarditis and a negative left heart catheterization in 2007,  who presents with atypical chest pain.  The pain is nonpleuritic and is  nonexertional.  She had negative left heart catheterization in 2007.  The differential diagnosis is recurrent pleuropericarditis or acute  coronary syndrome; however, given the atypical pain and the negative  cath in 2007, this is unlikely, possibility of pneumonia, possibility of  pulmonary embolus.  She is obese and is not very active, so this will  increase the  risk of venous thromboembolism, although she has had no  recent surgery, etc.  Echocardiogram did show normal left ventricular  function and did not show a pericardial effusion making  pleuropericarditis less likely.  A pulmonary embolism is possible as  mentioned above, so therefore we will check a D-dimer and we will rule  her out from myocardial infarction with cardiac enzymes and was started  on aspirin.  We will check a sed rate and a CRP and finally we will do a  chest x-ray to assess for any evidence of pneumonia.      Marca Ancona, MD  Electronically Signed     DM/MEDQ  D:  02/13/2009  T:  02/14/2009  Job:  [id number]

## 2011-01-04 NOTE — Assessment & Plan Note (Signed)
[facility]                            CARDIOLOGY OFFICE NOTE   [name], [name]                      MRN:          [id number]  DATE:09/06/2007                            DOB:          Jan 09, 1951    PRIMARY CARE PHYSICIAN:  Bryan Lemma. Manus Gunning, MD   PULMONOLOGIST:  [name]. [name], MD, FCCP   INTERVAL HISTORY:  Ms. Scalzo is a 60 year old woman with multiple  medical problems including diabetes, hypertension, morbid obesity, and  bipolar disorder.  In 2007, she underwent VATS drainage of a large  recurrent, left pleural effusion, as well as pericardial window for  pericardial effusion thought to be secondary to viral pericarditis.  A  cardiac catheterization preoperatively showed normal coronary arteries.   She returns to day for routine followup.  She says she continues to have  pain with deep breathing at the site of her previous chest tube, but  this really has not changed.  She recently was seen in our office about  2 weeks ago for an unscheduled visit for chest pain and dyspnea.  She  now says that she relates this to a lot of stress, as her husband may be  losing his job.  She got an echocardiogram that showed normal LV  function, just trivial pericardial effusion.  Given her normal coronary  arteries, it was not thought that her chest pain was ischemic.  She saw  her psychiatrist and her Seroquel was increased and now she feels much  better.  She has been taking her blood pressure at home and this has  been well controlled.   CURRENT MEDICATIONS:  1. Xanax, long-acting 1 mg t.i.d.  2. Aspirin 81.  3. Lamictal 225 in the morning, 150 at night.  4. Seroquel 400 in the morning, 600 at night.  5. Cozaar 100 a day.  6. Ambien 1 tablet at night.  7. K-Dur 20 a day.  8. Lasix 20 a day.  9. Flovent 110 mcg p.r.n.  10.Zocor 40 a day.  11.Singulair 10 a day.   PHYSICAL EXAMINATION:  She is an obese woman in no acute distress.  Ambulates  around the clinic without any respiratory difficulty.   Blood pressure is 120/70.  Heart rate is 95.  Rate is 312.  HEENT:  Normal.  NECK:  Thick and supple.  No obvious JVD.  Carotids are 2+ bilaterally  without any bruits. There is no lymphadenopathy or thyromegaly.  CARDIAC:  PMI is not palpable.  She has a regular rate and rhythm with a  very soft systolic ejection murmur at the left sternal border.  LUNGS:  Clear.  ABDOMEN:  Obese, nontender, nondistended.  There is no  hepatosplenomegaly.  No bruits, no masses.  Good bowel sounds.  EXTREMITIES:  Warm with no cyanosis, clubbing, or edema.  No rash.  NEURO:  She is alert and oriented x3.  Cranial nerves 2-12 are intact.  Moves all 4 extremities without difficulty.  Affect is pleasant.   EKG shows normal sinus rhythm with a rate of 95 with a left posterior  fascicular block.  There  is low voltage QRS, which is likely related to  her body habitus.   ASSESSMENT AND PLAN:  1. History of pleural and pericardial effusions, status post drainage.      These are stable.  There is no evidence of recurrence.  2. Pain at previous chest tube site.  I think this is nonspecific.      Will get a chest x-ray just to make sure there is no evidence of      residual pneumothorax or any other pathology.  3. Hypertension, that is well controlled.   DISPOSITION:  She can return to [place] on a p.r.n. basis.     Bevelyn Buckles. Bensimhon, MD  Electronically Signed    DRB/MedQ  DD: 09/06/2007  DT: 09/06/2007  Job #: [id number]   cc:   Bryan Lemma. Manus Gunning, M.D.  [name]. [name], MD, FCCP

## 2011-01-04 NOTE — Assessment & Plan Note (Signed)
[facility]                            CARDIOLOGY OFFICE NOTE   Yolanda, [name]                      MRN:          [id number]  DATE:01/01/2007                            DOB:          Nov 24, 1950    PRIMARY CARE PHYSICIAN:  Bryan Lemma. Manus Gunning, M.D.   PULMONOLOGIST:  [name]. [name], MD, FCCP.   INTERVAL HISTORY:  [name] is a 60 year old woman with multiple  medical problems including diabetes, hypertension, morbid obesity, and  bipolar disorder.  At the end of last year she underwent VATS drainage  of a large, recurrent, left pleural effusion as well as a pericardial  window for a pericardial effusion thought secondary to a viral  pericarditis.  Cardiac catheterization preoperatively showed normal  coronaries.   She returns today for routine followup.  She says she has been under a  lot of stress due to problems with her 42 year old daughter.  She denies  any shortness of breath or lower extremity edema.  She does have  occasional pain at the site of her previous chest tube.   CURRENT MEDICATIONS:  1. Aspirin 325 a day.  2. Lamictal 150 b.i.d.  3. Seroquel 400 b.i.d.  4. Xanax 0.5 t.i.d.  5. Singulair 10 a day.  6. Zocor 40 a day.  7. Potassium 20 a day.  8. Lasix p.r.n.   PHYSICAL EXAMINATION:  GENERAL:  She is in no acute distress, ambulates  around the clinic without any respiratory difficulty.  VITAL SIGNS:  Blood pressure is 128/90, heart rate is 85, weight is 286.  HEENT:  Eyes, normal.  NECK:  Supple and thick.  No obvious JVD.  Carotids are 2+ bilaterally  without any bruits.  There is no lymphadenopathy or thyromegaly.  CARDIAC:  Regular rate and rhythm.  No murmurs, rubs, or gallops.  LUNGS:  Clear with minimally decreased breath sounds at the right base.  ABDOMEN:  Obese, nontender, nondistended.  No hepatosplenomegaly.  No  bruits.  No masses.  Good bowel sounds.  EXTREMITIES:  Warm with no cyanosis, clubbing, or edema.   No rash.  NEUROLOGIC:  Alert and oriented x3.  Cranial nerves II-XII are intact.  Moves all four extremities without difficulty.  Affect is mildly  flattened.   EKG shows normal sinus rhythm with low voltage at a rate of 85 beats per  minute.  There is no electrical alternans.   ASSESSMENT/PLAN:  History of pleuropericarditis, status post VATS  drainage of the pleural effusion and a pericardial window.  She seems to  be doing quite well.  We will get a followup echocardiogram and chest x-  ray to make sure that these have resolved.  She will just follow back up  with [place] on an as needed basis.     Bevelyn Buckles. Bensimhon, MD     DRB/MedQ  DD: 01/01/2007  DT: 01/01/2007  Job #: [id number]   cc:   Bryan Lemma. Manus Gunning, M.D.

## 2011-01-07 NOTE — Cardiovascular Report (Signed)
Yolanda, [name] NO.:  [id number]   MEDICAL RECORD NO.:  [id number]          PATIENT TYPE:  INP   LOCATION:  2313                         FACILITY:  [facility]   PHYSICIAN:  Veverly Fells. Excell Seltzer, MD  DATE OF BIRTH:  1951/04/15   DATE OF PROCEDURE:  DATE OF DISCHARGE:                              CARDIAC CATHETERIZATION   PROCEDURE:  1. Left heart catheterization.  2. Right heart catheterization.  3. Simultaneous left ventricular and right heart pressure measurement.  4. Thermodilution and Fick cardiac outputs.   INDICATION:  [name] is a 60 year old woman who presented with chest  pain.  She underwent a diagnostic cardiac catheterization that demonstrated  no obstructive coronary artery disease and hyperdynamic left ventricular  function.  This morning, she developed chest pain with associated  hypotension and a finding of pulsus paradoxus.  An echocardiogram  demonstrated a small pericardial effusion.  She was subsequently referred  for a right and left heart catheterization to assessment for cardiac  tamponade hemodynamics.  Procedural details, risks and indications were  explained in detail to the patient.  Informed consent was obtained.   DESCRIPTION OF PROCEDURE:  The right groin was prepped and draped in normal  sterile fashion, anesthetized with 1% lidocaine.  Using a modified Seldinger  technique, a 7-French venous sheath was placed in the right common femoral  vein and a 6-French arterial sheath was placed in the right common femoral  artery.  A 6-French angled pigtail catheter was placed into the left  ventricle and ventricular pressures were recorded.  The pigtail catheter was  left in the left ventricle while a complete right heart catheterization was  performed for simultaneous pressure measurement.  Oxygen saturations were  drawn in the superior vena cava, pulmonary artery and left ventricle.  Thermodilution cardiac output was performed.  At the  conclusion of the case,  the patient was transferred to Recovery, where the arterial and venous  sheaths will be pulled and manual pressure used for hemostasis.   FINDINGS:  Right atrial pressure:  A wave 23, V wave 30, mean pressure 18.  Right ventricular pressure 43/13 with an end-diastolic pressure of 18.  Pulmonary artery pressure 37/23 with a mean pressure of 30.  Pulmonary capillary wedge pressure 25; V wave is 24; mean is 21.  Left ventricular pressure is 103/17 with an end-diastolic pressure of 20.  Aortic pressure is 103/65 with a mean of 82.   Oxygen saturations:  SVC SAT is 60, PA SAT 32, LV SAT is 94.   Thermodilution cardiac output is 6.5 L/min with a cardiac index of 2.5.  Fick cardiac output is 5 L/min with a cardiac index of 1.9.   ASSESSMENT:  Hemodynamics do show some features of cardiac tamponade  physiology.  However, they are not clearly diagnostic.  Assessment is made  difficult by the patient's severe obesity, as this can give some features of  cardiac tamponade.  Her diastolic pressure are clearly elevated, but there  is some variability, as her pulmonary artery diastolic pressure is higher  than her diastolic pressure in the other cardiac  chambers.  There is some  blunting of the  Y descents in both the left and right ventricles.  In reviewing her clinical  situation and hemodynamics, I think that cardiac tamponade is unlikely.  However, I certainly cannot rule it out.  We will continue to monitor the  patient, hold diuretics and follow her clinical status closely.      Veverly Fells. Excell Seltzer, MD  Electronically Signed     MDC/MEDQ  D:  05/18/2006  T:  05/20/2006  Job:  [id number]   cc:   Bevelyn Buckles. Bensimhon, MD  Bryan Lemma. Manus Gunning, M.D.

## 2011-01-07 NOTE — Assessment & Plan Note (Signed)
[facility]                             PULMONARY OFFICE NOTE   [name], [name]                      MRN:          [id number]  DATE:07/27/2006                            DOB:          [birth year]    PULMONARY/FINAL FOLLOW UP OFFICE VISIT:   HISTORY OF PRESENT ILLNESS:  A 60 year old white female with recurrent  left pleuritic pain on November 21 in a setting of having undergone a  pericardial window for fibrinous pleural pericarditis by Dr. Donata Clay  in October 2007.  She had responded to Indomethacin but stopped it.  I  recommended that she try Mobic 7.5 mg b.i.d., but she did not take it  because she did not want to cover up the pain.  The pain, in the  meantime, has improved, and she denies any significant dyspnea over  baseline.  Her rheumatologic workup suggests elevation of sed rate with  nonspecific markers, and she plans to see Dr. Corliss Skains in followup.   PHYSICAL EXAMINATION:  GENERAL:  She is a pleasant, ambulatory, white  female in no acute distress.  VITAL SIGNS:  Stable.  HEENT:  Unremarkable.  Pharynx is clear.  LUNGS:  Lung fields revealed clear bilaterally to auscultation and  percussion.  HEART:  Regular rhythm without murmurs, gallops, rubs.  ABDOMEN:  Soft, benign without palpable organomegaly, masses or  tenderness.  EXTREMITIES:  Warm without calf tenderness, cyanosis, clubbing or edema.   IMPRESSION:  Nonspecific pleuritis that appears to be inflammatory in  nature and waxes and wanes with no definite pattern in terms of response  to nonsteroidals.  I did remind her that the Mobic was not intended for  pain but rather as an antiinflammatory, so she does have recurrent  symptoms of either dyspnea or pain.  I would like her to go ahead and  start the Mobic back.   I do agree with having her see Dr. Corliss Skains for rheumatologic  evaluation to see if there is an underlying systemic illness that might  explain her  fibrinous pleuritis.  Unless she has symptoms that fail to  respond to Mobic, there is nothing else that we need to see her for here  in the Pulmonary Clinic, and I will defer all of her followup to Dr.  Manus [name] and Dr. Corliss Skains.     [name]. Yolanda Sires, MD, [facility]  Electronically Signed    MBW/MedQ  DD: 07/27/2006  DT: 07/28/2006  Job #: [id number]   cc:   [name]. [name], M.D.  [name], M.D.

## 2011-01-07 NOTE — Assessment & Plan Note (Signed)
[name]                             [name]   [name], [name]                      MRN:          [id number]  DATE:09/01/2006                            DOB:          [name]    HISTORY:  A 60 year old white female with morbid obesity and unexplained  pleuropericarditis with elevated sed rate for which she was referred to  rheumatology but has not yet made the appointment. She continues to be  bothered by pleuritic pain that seems quite Mobic dependent in that  when she remembers to take Mobic it goes away, when she forgets to take  a dose it comes back. This has been a recurrent pattern for several  months since the onset of her symptoms in October. She is status post  pericardial window with no specific diagnosis rendered.   She also complains of dyspnea just walking to her mailbox and back.  She says she uses p.r.n. Flovent for this and it helps some. I  suggested before that she use we reverse of a therapeutic trial to try  to minimize the number of medicines she takes and she found by trial and  error that every time she fails to takes Singulair, her breathing is  immediately much worse.   PHYSICAL EXAMINATION:  GENERAL:  She is an anxious and depressed  appearing, ambulatory, obese, white female that has actually a difficult  time answering any of the questions I ask her in a straight forward  manner.  VITAL SIGNS:  She is afebrile, stable vital signs.  HEENT:  Unremarkable. Oropharynx is clear.  LUNGS:  Lung fields reveal minimal decreased breath sounds at the left  base, air movement is adequate but no rub.  HEART:  There is a regular rate and rhythm with no rub appreciated.  ABDOMEN:  Obese, soft and benign.  EXTREMITIES:  Warm without calf tenderness, cyanosis, clubbing or edema.   We walked the patient around the office. Her peak heart rate was 124  with no desaturation.   Chest x-ray shows minimal pleural  thickening at the left base with  cardiomegaly but no infiltrates or effusions.   IMPRESSION:  1. Unexplained pleural pericarditis associated with elevated sed rate      and responsive to Mobic. In this setting, I believe it is      appropriate to continue the Mobic on a p.r.n. basis.  2. Her dyspnea seems disproportionate to objective findings and the      unusual history that she gets response immediately to Flovent      makes me think she has a functional component to the problem. I      also noted for the record that she is lisinopril and very well      could be having upper airway dysfunction mimicking lower airways      disease and recommended strongly that it be switched to an ARB on a      trial basis for the next 6-8 weeks. However, because I do not plan      on following the patient regularly here,  I am not going to make the      change today but rather deferred this back to Dr. Manus [name]. If she      continues to have shortness of breath after switched off of ACE      inhibitors, I would like to see her back for a CPST (cardiac      Yolanda      stress test). I suspect however that most of her problems at this      point is related to obesity deconditioning/upper airway dysfunction      and not a true cardiac or Yolanda limitations.     [name]. Yolanda Sires, MD, [facility]  Electronically Signed    MBW/MedQ  DD: 09/01/2006  DT: 09/01/2006  Job #: [id number]   cc:   [name]. [name], M.D.  Dr. Corliss [name]

## 2011-01-07 NOTE — H&P (Signed)
NAMEKAYRON, KALMAR NO.:  [id number]   MEDICAL RECORD NO.:  [id number]          PATIENT TYPE:  INP   LOCATION:  2313                         FACILITY:  [facility]   PHYSICIAN:  Bevelyn Buckles. Bensimhon, MDDATE OF BIRTH:  Jan 05, 1951   DATE OF ADMISSION:  05/17/2006  DATE OF DISCHARGE:                                HISTORY & PHYSICAL   PRIMARY CARE PHYSICIAN:  Bryan Lemma. Manus Gunning, M.D.   HISTORY OF PRESENT ILLNESS:  Ms. Heishman is a 60 year old, white female with  a past medical history significant for diabetes, hypertension and  hyperlipidemia who presents with a 1-day history of chest pain which started  at around 7 a.m. today described as 10/10 in intensity radiating towards the  neck, worsened by activity and changing position.  She took some ibuprofen  and Tylenol without significant relief.  She denies the issue of nausea,  vomiting and sweating.  There was associated shortness of breath with  exertion.  No history of recent travel, cough, fever or hemoptysis.  At 4  p.m. today, the pain became worse and the patient came to the ED.  She also  complains of throat pain.   ALLERGIES:  No known drug allergies.   PAST MEDICAL HISTORY:  1. Diabetes, diet-controlled.  2. Hypertension.  3. Obesity.  4. Asthma.  5. Sleep apnea on CPAP.  6. Bipolar disorder.  7. Hyperlipidemia.   MEDICATIONS:  1. Seroquel 100 mg in the a.m. and 500 mg p.r.n.  2. Diltiazem 240 mg p.o. b.i.d.  3. Zolpidem 10 mg p.o. once daily, limit 200 mg in a.m. and 13 mg p.m.  4. Singulair 10 mg p.o. once daily.  5. Simvastatin 40 mg p.o. once daily.  6. Furosemide 40 mg b.i.d.  7. Aspirin one tablet p.o. once daily.  8. Alprazolam 0.5 mg p.r.n.   FAMILY HISTORY:  Sister has heart disease.   SOCIAL HISTORY:  Nonsmoker, nonalcoholic, no history of drug abuse.  Lives  with husband.  She has [employer] out of [employer].   REVIEW OF SYSTEMS:  GASTROINTESTINAL:  Negative for  nausea, vomiting or  diarrhea.  CARDIOPULMONARY:  Negative for cough, fever or hemoptysis.  HEENT:  Complains of sore throat.  Denies any history of fever.   PHYSICAL EXAMINATION:  VITAL SIGNS:  Blood pressure 146/82, heart rate 88,  saturations 98% on room air.  GENERAL:  Obese lady in mild to moderate distress.  HEENT:  Oropharynx is clear.  Pupils equal round and reactive.  Extraocular  movements intact.  Atraumatic, normocephalic.  NECK:  No JVD.  Neck is supple.  CARDIAC:  S1, S2 present, regular rate and rhythm.  No murmurs, rubs or  bruits.  LUNGS:  Clear to auscultation bilaterally.  No wheeze or crackles.  ABDOMEN:  Bowel sounds present, nontender, nondistended.  NEUROLOGIC:  Grossly nonfocal examination.  EXTREMITIES:  No pedal edema, cyanosis or clubbing.   LABORATORY DATA AND X-RAY FINDINGS:  Labs pending.   EKG shows ST elevation in II, III and aVF compared to previous EKG.   ASSESSMENT/PLAN:  A 60 year old female  with a history of diabetes,  hypertension and hyperlipidemia who presents with chest pain.  1.Chest pain.The differential diagnoses are acute coronary syndrome,  pulmonary embolism, pneumothorax.  Initial EKG showed ST elevation in II,  III and aVF suggestive of an Acute inferior wall MI.  The patient received  aspirin and nitroglycerin in the emergency department.  She was emergently  taken to the catheterization lab for cardiac catheterization with or without  PCI. Will also check cardiac enzymes,d-dimer and do a chest xray.  She will  also need aggressive risk factor modification mainly control of her  diabetes, hypertension and hyperlipidemia.  She has been started on aspirin,  Zocor, beta-blockers and angiotensin-converting enzyme inhibitors.  1. Diabetes.  Will check her HBAiC and will start her on sliding scale      insulin. Will hold metformin  2. Hypertension.  Will add beta-blockers and angiotensin-converting enzyme      inhibitors.  3. Bipolar.   We will continue the Lamictal, Seroquel and Alprazolam.  4. Asthma.  Will continue with Singulair.  5. Sleep apnea.  Will continue her CPAP per home  settings.  6. Prophylaxis.  Will start on Protonix 40 mg once daily.      Ronda Fairly, M.D.  Electronically Signed      Bevelyn Buckles. Bensimhon, MD  Electronically Signed    YB/MEDQ  D:  05/17/2006  T:  05/19/2006  Job:  [id number]

## 2011-01-07 NOTE — Discharge Summary (Signed)
NAMEKAWENA, LYDAY NO.:  [id number]   MEDICAL RECORD NO.:  [id number]          PATIENT TYPE:  INP   LOCATION:  2016                         FACILITY:  [facility]   PHYSICIAN:  [name], M.D.  DATE OF BIRTH:  February 24, 1951   DATE OF ADMISSION:  06/03/2006  DATE OF DISCHARGE:  06/14/2006                                 DISCHARGE SUMMARY   PRIMARY/ADMISSION DIAGNOSIS:  Short of breath.   ADDITIONAL/DISCHARGE DIAGNOSES:  1. Fibrinous pericarditis.  2. Pleuritis.  3. Recurrent left pleural effusion.  4. Pericardial effusion.  5. History of paroxysmal atrial fibrillation on a previous admission, now      maintaining sinus rhythm.  6. Diet-controlled diabetes mellitus.  7. Hypertension.  8. Obesity.  9. Asthma.  10.Sleep apnea on continuous positive airway pressure.  11.Bipolar disorder.  12.Hyperlipidemia.  13.Urinary tract infection.  14.Anxiety disorder.  15.History of irritable bowel syndrome.   PROCEDURES PERFORMED:  1. Left thoracenteses x2.  2. Subxyphoid pericardial window.  3. Left VATS with drainage of loculated left pleural effusion and pleural      biopsy.   HISTORY:  The patient is a 60 year old female who was recently admitted with  progressive dyspnea.  She was found, at that time, to have pericardial  fusion without definite tamponade and was treated with nonsteroidal.  Her  hospital course was complicated by atrial fibrillation and hypertension.  However, she improved after diuresis after the reduction and her treatment  of her hypertension.  She was discharged home and, over the course of the  past several weeks, she has had progressively worsening dyspnea and  orthopnea.  She ultimately presented to the emergency department on the date  of this admission where she was seen by [facility].  She was  tachypneic and hypertensive and her chest x-ray showed worsening airspace  disease, which prompted admission for further workup.    HOSPITAL COURSE:  She was admitted to [facility] on June 03, 2006.  She was seen in consultation by Dr. [name] for pulmonary critical care  medicine.  She was found to have a large left pleural effusion on chest x-  ray and she underwent thoracentesis at the bedside, yielding approximately  50 mL of thick, yellow fluid, which was felt to be partially loculated.  Because she continued to remain symptomatic, she underwent a repeat  thoracentesis in radiology and approximately 380 mL of bloody,  serosanguineous fluid was removed.  The initial cytology and pathology on  the fluid showed an exudative inflammatory-type effusion of unclear  etiology.  She continued to remain symptomatic despite aggressive therapy.  Her chest x-ray continued to show evidence of loculated effusion.  Dr. Donata Clay was subsequently evaluated for cardiothoracic surgery evaluation.  He  felt that the patient's best course of action, at this point, would be to  proceed with left VATS for drainage of her effusion.  She was also noted to  have a small pericardial effusion and he felt that a concomitant subxyphoid  pericardial window would also be required.  He explained the risks,  benefits,  and alternatives of the procedures to the patient and she agreed  to proceed with surgery.  She was taken to the operating room on June 06, 2006 and underwent a left VATS and a subxyphoid pericardial window with  drainage of both her pleural and pericardial effusion.  She tolerated the  procedures well and was transferred to the SICU in stable condition.   Postoperatively, she was started on a Lasix drip as well as a steroid taper.  Pathology of the pericardial biopsy showed organizing fibrinous pericarditis  with no evidence of malignancy.  Pleural biopsy was positive for chronic  mild pleuritis with no evidence of malignancy.  The results showed no  evidence of malignancy in the pleural fluid or pericardial fluid  which was  obtained.  She was continued on IV Maxipime postoperatively to cover both  her pulmonary and for a preoperative E. Coli urinary tract infection.  Her  antibiotic coverage was expanded to cover mycoplasma and doxycycline was  subsequently added.  She remained stable and was able to be transferred to  the stepdown unit on postoperative day #4.  A PT consult was obtained and  she has been slowly increasing her activity.  Her blood sugars have been  mildly elevated postoperative secondary to her previous diet-controlled  diabetes mellitus as well as her steroid taper and she has been covered  during this admission with sliding-scale insulin and a low-dose basal  insulin.  At the time of discharge, her low sugars have been stable and it  was felt she would not require additional insulin at home.  She has  continued to remain very volume overloaded and was aggressively diuresed  with both IV and p.o. Lasix.  She is tolerating a regular diet and is having  normal bowel and bladder function.  Her incisions are all healing well and  all of her chest tubes have been removed.  She has presently been  transferred to the floor and is progressing well.  Her most recent chest x-  ray shows significant improvement.  Her most recent labs show a sodium of  140, potassium 4.4, BUN 16, creatinine 0.6.  Her most recent hematologic  studies show a hemoglobin of 10.3, hematocrit 30.9, white count 7.8,  platelets 479,000.  She will continue steroids, antibiotics, and her insulin  through postoperative day #7.  Most of these doses are completed, these  medications will be discontinued and it is felt she will not require any  further treatment at home.  She will be observed for the next 24 hours and a  followup PA and lateral chest x-ray will be obtained.  If she has remained  stable and no acute changes occur, she will be ready for discharge home on  June 14, 2006.  DISCHARGE MEDICATIONS:  1.  Enteric-coated aspirin 325 mg daily.  2. Lasix 80 mg daily x5 days.  3. Lisinopril 10 mg b.i.d.  4. K-Dur 20 mEq daily x5 days.  5. Albuterol as directed at home.  6. Lamictal 100 mg b.i.d.  7. Seroquel 400 mg b.i.d.  8. Xanax 1 mg t.i.d.  9. Singular 10 mg daily.  10.Zocor 40 mg daily.  11.Flovent 110 mcg daily.  12.Phenergan and Compazine p.r.n.  13.Darvocet-N 100 1 to 2 q.4 to 6 hours p.r.n. pain.   DISCHARGE INSTRUCTIONS:  She is asked to refrain from driving, heavy  lifting, or strenuous activity.  She may continue ambulating daily and using  her incentive spirometer.  She may  shower daily and clean her incisions with  soap and water.  She will continue a low-fat, low-sodium diet with diabetic  restrictions.   DISCHARGE FOLLOWUP:  She will see Dr. Gala Romney in the office in 2 weeks and  will have chest x-ray at this visit.  She will then see Dr. Donata Clay on  July 07, 2006 at 1:15 p.m.  She is asked to bring her chest x-ray to  this appointment for Dr. Donata Clay to review.  She will follow up with Dr.  [name] as directed.  She will call in the interim if she experiences any  problems or has questions.      Coral Ceo, P.A.      [name], M.D.  Electronically Signed    GC/MEDQ  D:  06/13/2006  T:  06/13/2006  Job:  [id number]   cc:   Bevelyn Buckles. Bensimhon, MD  [name]. [name], MD, FCCP  Bryan Lemma. Manus Gunning, M.D.

## 2011-01-07 NOTE — Consult Note (Signed)
NAMEGRAVIELA, [name] NO.:  [id number]   MEDICAL RECORD NO.:  [id number]          [name] TYPE:  INP   LOCATION:  3731                         FACILITY:  [facility]   PHYSICIAN:  Antonietta Breach, M.D.  DATE OF BIRTH:  01-Dec-1950   DATE OF CONSULTATION:  05/29/2006  DATE OF DISCHARGE:                                   CONSULTATION   REQUESTING PHYSICIAN:  Atglen Bing.   REASON FOR CONSULTATION:  Anxiety and mood condition.   HISTORY OF PRESENT ILLNESS:  [name] is a 60 year old female, admitted to  [facility], on Yolanda 4th of October, 2007, with atrial  fibrillation.  [name] has had severe feeling of on edge, progressing to  Yolanda point of panic attacks.  She has a number of stresses, including having  had a pericardial effusion recently and a diagnosis of pericarditis.  She  has been utilizing Xanax XR standing in order to control Yolanda anxiety.  It  has been increased to 1 mg t.i.d.   [name] has been having some slurred speech today, but Yolanda anxiety is  controlled.  She is still having some insomnia.  She does not have any  thoughts of harming herself or others.  She has no hallucinations or  delusions.   [name] has 2 months of decreased energy and decreased concentration.  She has had some increased thought speed, as well as mild depressed mood.  She also has excess worry.  She notes that her husband has had back surgery  recently as well.   PAST PSYCHIATRIC HISTORY:  [name] has had psychiatric admission in Yolanda  past.  Most recently, she was admitted to [facility]  in March of 2007.  She also was admitted there in November of 2006.  She  does not have any history of delirium.  She does have a history of depressed  mood; however, she has no history of suicide attempts.  She does have a  history of mania.  She has a longterm history of general anxiety and panic  symptoms.   Her previous psychotropic  trials have included Depakote, which was not  effective.  She has not been on lithium and she has not been on Zyprexa.   Most recently, she has been treated with Lamictal 100 mg daily and Seroquel  400 mg b.i.d.  Her Xanax has been increased to 1 mg XR t.i.d. for  controlling her acute anxiety.  She is also on Ambien 5 mg q.h.s. p.r.n.   FAMILY PSYCHIATRIC HISTORY:  [name]'s daughter has bipolar disorder and  is seen by Yolanda same outpatient psychiatrist.  [name] has much  depression and anxiety in Yolanda family.   SOCIAL HISTORY:  [name] has been married for 35 years.  She has 2  children.  Her education level is high school.  Occupation:  Medically  disabled.  She lives by herself.   GENERAL MEDICAL PROBLEMS:  1. Pericarditis.  2. Atrial fibrillation.  3. Diabetes mellitus.  4. Obstructive sleep apnea, treated with CPAP.  5.  Hypertension.  6. Hyperlipidemia.   MEDICATIONS:  Yolanda MAR is reviewed.  [name] is on psychotropics:  1. Lamictal 100 mg daily.  2. Seroquel 400 mg b.i.d.  3. Ambien 5 mg q.h.s. p.r.n.  4. Xanax 0.5 mg q.8 hours p.r.n.  5. Xanax XR 1 mg t.i.d.   ALLERGIES:  HER ALLERGIES INCLUDE RISPERDAL.   LABORATORY DATA:  Yolanda hemoglobin is 10.4, WBC 12.4.  INR is 1.3.  Her liver  function tests are within normal limits.  Glucose is 234, calcium is 8.2.  Her TSH is increased at 12.6.   REVIEW OF SYSTEMS:  CONSTITUTIONAL:  Afebrile.  HEAD:  No trauma.  EYES:  No  visual changes.  EARS:  No hearing impairment.  NOSE:  No rhinorrhea.  MOUTH/THROAT:  No sore throat.  NEUROLOGIC:  Unremarkable.  PSYCHIATRIC:  As  above.  CARDIOVASCULAR:  [name] has mild chest discomfort.  RESPIRATORY:  No coughing.  She does have mild shortness of breath.  GASTROINTESTINAL:  She is experiencing some nausea, no vomiting.  GENITOURINARY:  No dysuria.  SKIN:  Unremarkable.  MUSCULOSKELETAL:  No  deformities.  ENDOCRINE/METABOLIC:  As above.  HEMATOLOGIC/LYMPHATIC:   Unremarkable.   EXAMINATION:  VITAL SIGNS:  Temperature 98.5.  Pulse 89.  Respirations 21.  Blood pressure 125/81.  [name]'s CBGs have been running 241 and 218.  Her O2 saturation on 2 liters is 96%.   MENTAL STATUS EXAM:  [name] is alert.  She is oriented to person, place,  year, month, Yolanda date, as well as Yolanda circumstances.  Her psychomotor tone  is within normal limits.  She exhibits no agitation.  Her eye contact is  poor.  Her speech is slightly slurred.  She is well-groomed in appearance.  Her mood is mildly depressed.  Her affect is mildly constricted at baseline,  but there is a broad appropriate response.  Her thought content involves no  hopelessness.  She has no thoughts of harming herself, no thoughts of  harming others.  No delusions.  No hallucinations.  She does have intact  interests; however, her anxiety symptoms, her insomnia and her general  medical stresses have an associated mild depressed mood.  Her thought  process is logical, coherent and goal directed.  No looseness of  associations.  While [name] reports some racing thoughts, she does not  have any increased thought speed.  Her memory shows 3/3 immediate, 2/3 on  recall.  Her remote recall is intact.  Her insight is good.  Her judgment is  intact.  Her fun of knowledge and intelligence are within normal limits.   ASSESSMENT:  AXIS I:  293.83, mood disorder not otherwise specified  (functional in general medical factors), depressed.  Bipolar I disorder,  depressed in partial remission.  Anxiety disordered, not otherwise  specified, 293.84.  AXIS II:  Deferred.  AXIS III:  See general medical problems.  AXIS IV:  General medical.  AXIS V:  55.   [name] is not at risk to harm herself or others.  She agrees to use  emergency services for any thoughts of harming herself, thoughts of harming  others or to stress.  Informed consent, Yolanda indications, alternatives and adverse effects of her   psychotropic medications were discussed.  She wants to continue Yolanda Lamictal  and Seroquel.  She wants to adjust Yolanda Xanax as described below, as  redistribute Yolanda Seroquel as described below and she wants to continue Yolanda  Ambien 5 mg  q.h.s. p.r.n.   RECOMMENDATIONS:  1. I would change Yolanda Seroquel to 100 mg p.o. b.i.d. at 8 a.m. and 12 p.m.      and then 600 mg q.h.s. for antimanic psychosis, as well as antianxiety      acutely.  2. Would decrease [name]'s regular Xanax to 0.75 mg t.i.d. in order      to eliminate side effects while still controlling acute anxiety.  3. Would continue Yolanda Lamictal at 100 mg daily as [name]'s primary      mood stabilizer.  4. Would continue Ambien 5 mg q.h.s. p.r.n. insomnia, as well as Yolanda      [name]'s CPAP.  5. Ego supportive therapy and education  6. Regarding [name]'s mild depressed mood, will avoid a primary      antidepressant anticipating that as [name]'s sleep is stabilized      and her acute stressors are decreased that her mild depressive symptoms      will resolve.  However, her mood needs to be closely followed.  7. Preliminary discharge planning.  At this point, we will follow while      [name] is in Yol[facility] hospital and anticipate [name] returning to      her outpatient psychiatrist.      Antonietta Breach, M.D.  Electronically Signed     JW/MEDQ  D:  05/29/2006  T:  05/30/2006  Job:  [id number]

## 2011-01-07 NOTE — Discharge Summary (Signed)
NAMEBEAUTY, PLESS NO.:  [id number]   MEDICAL RECORD NO.:  [id number]          PATIENT TYPE:  INP   LOCATION:  4709                         FACILITY:  [facility]   PHYSICIAN:  Luis Abed, MD, FACCDATE OF BIRTH:  November 23, 1950   DATE OF ADMISSION:  DATE OF DISCHARGE:  05/21/2006                                 DISCHARGE SUMMARY   DISCHARGING DIAGNOSES:  1. Chest pain, probable pericarditis.  Status post cardiac catheterization      on May 17, 2006, by Dr. Riley Kill.  Patient found to have negative      cardiac catheterization, however, noted to have inferior ST elevation.      CT scan showing small to moderate pericardial effusion.  Right and left      cardiac catheterization done on May 18, 2006, by Dr. Excell Seltzer to      evaluate hemodynamic status/rule out cardiac tamponade:  Dr. Excell Seltzer      felt cardiac tamponade was unlikely.  Patient's thermodilution cardiac      output was 6.5 L per minute with a cardiac index of 2.5, cardiac output      of 5 L/minute with a cardiac index of 1.9.  2. Hypotension which has resolved.  3. Diabetes.  4. Hypertension.  5. Sleep apnea requiring continuous positive airway pressure.  6. Bipolar.  7. Hypercholesterolemia.  8. Asthma.  9. Nausea, questionable etiology, possibly nonsteroidal antiinflammatory      drug use, resolved.  10.Morbid obesity.   HOSPITAL COURSE:  Miss Groom is a 60 year old Caucasian female with medical  history as stated above who presented to [facility] complaining of chest  discomfort.  She underwent a diagnostic cardiac catheterization that  demonstrated no obstructive disease and hyperdynamic left ventricular  function.  Echocardiogram showed mild pericardial effusion.  The day,  postcardiac catheterization, the patient developed chest pain with  associated hypotension and finding of pulsus paradoxus.  The patient was  subsequently referred for a right and left heart catheterization to  assess  for cardiac tamponade, hemodynamic.  Results as stated.  The patient  tolerated the procedure without complications.  The chest discomfort  etiology was felt to be pericarditis.  The patient treated with Indocin.  Developed some nausea and mild wheezing after placed on NSAID.  The patient  started on inhalers/__________  with resolution of wheezing.  Nausea  resolved with Zofran and patient stable to be discharged home at this time.  Patient's blood cultures negative.  Urine culture negative.  Cultures done  as a result of fever.  However, at time of discharge, patient afebrile.  Blood pressure stable at 115/68.  Last blood work done on the 28th showed an  H&H of 12.1 and 35.8.  WBCs of 8.5, platelet count of 199,000.  Sodium 140,  potassium 4.2, BUN 19, creatinine 1.6, with a glucose of 158.  Patient's  cath site stable.  Seen by Dr. Myrtis Ser prior to discharge.   At time of discharge, patient has been given the postcardiac catheterization  discharge instructions.  She is to continue a heart healthy diabetic diet.  Followup  with Dr. Gala Romney on October 8 at 2:30.  Follow up with Dr.  Manus Gunning.  Patient to call for appointment.  Schedule her for an  echocardiogram October 1 at 7:30.  She also needs a BMET done at that time.   MEDICATIONS AT DISCHARGE INCLUDE:  1. Aspirin 81 mg daily.  2. Zocor, Singulair, Seroquel, Lamictal, Ambien, and Xanax as previously      prescribed.  3. New medication:  Lisinopril 10 mg daily.  4. Patient instructed to continue her diltiazem 240 mg b.i.d.  5. Indocin 25 mg p.o. b.i.d. with food x6 more days.  6. Lasix.  Patient instructed to resume her previous dose of 40 mg b.i.d.  7. I have also given patient prescriptions for her albuterol and Flovent      inhalers.   Duration of discharge encounter:  30 minutes.     ______________________________  Dorian Pod, ACNP    ______________________________  Luis Abed, MD, [facility]    MB/MEDQ   D:  05/21/2006  T:  05/21/2006  Job:  [id number]

## 2011-01-07 NOTE — Assessment & Plan Note (Signed)
[facility]                             PULMONARY OFFICE NOTE   ZAHARA, REMBERT                      MRN:          [id number]  DATE:07/20/2006                            DOB:          06-07-1951    HISTORY OF PRESENT ILLNESS:  The patient is a 60 year old white female  patient of Dr. Thurston Hole who was admitted to the hospital on October 13-24  for evaluation of progressive dyspnea felt to be due to fibrinous  pleural pericarditis requiring and recurrent pleural effusion requiring  left arthrodesis times two, left VATS with drainage of a loculated left  pleural effusion and pleural biopsy.  Biopsies showed benign fibrinous  pleuritis with no evidence of granulomatous change or effusion.  She  presents today for one week follow up.  Last visit, the patient's chest  x-ray showed a small loculated process in the left base which was not  significantly different from her postop x-ray.  Lab work revealed a  significantly elevated sed rate at 70.  CBC was essentially  unremarkable, and C-met was unremarkable except for a glucose of 178.  The patient had been taken off of Singulair last visit.  However,  reports that she had some wheezing when she stopped Singulair and has  now restarted this.  She did change her Lasix over to as needed  medicine and has not noticed any increased lower extremity edema.   PAST MEDICAL HISTORY:  Reviewed.   CURRENT MEDICATIONS:  Reviewed.   PHYSICAL EXAMINATION:  GENERAL:  The patient is an obese female in no  acute distress.  VITAL SIGNS:  She is afebrile with stable vital signs.  HEENT:  Unremarkable.  NECK:  Supple without adenopathy.  No JVD.  LUNGS:  Sounds are clear.  CARDIAC:  Regular rate.  ABDOMEN:  Obese and soft.  EXTREMITIES:  Warm without any edema.   IMPRESSION/PLAN:  1. Recurrent pleural effusion and recent hospitalization for fibrinous      pleural pericarditis with an elevated sed rate.  The patient  will      be referred to rheumatology for further evaluation and treatment      options.  2. Left pleuritic pain.  Improved with Mobic.  3. Complex medication regimen.  The patient, unfortunately, did not      bring her medication in      today for review.  I have asked that she remember to bring her      medications in on her next visit.  She will follow back up with Dr.      [name] after Rheumatology has consult in 6-8 weeks.      Rubye Oaks, NP  Electronically Signed      [name]. [name], MD, [facility]  Electronically Signed   TP/MedQ  DD: 07/20/2006  DT: 07/21/2006  Job #: [id number]

## 2011-01-07 NOTE — Assessment & Plan Note (Signed)
[facility]                              CARDIOLOGY OFFICE NOTE   [name], [name]                      MRN:          [id number]  DATE:05/23/2006                            DOB:          [birth year]    This is a 60 year old white female patient who presented to [facility]  recently and was found to have pericardial effusion but was not felt to have  cardiac tamponade. Her hypotension resolved and she was treated with Indocin  and discharged home. She had a followup echo yesterday in our office that  showed moderate size circumferential pericardial effusion. There appeared to  be some compression of the right atrium and early diastole. No RV diastolic  collapse and mild respirophasic changes in the mitral valve flow. Overall EF  was 60%. She called in this morning complaining of palpitations and is here  saying when she woke up and put her feet on the floor her heart started  racing and she became diaphoretic. This was followed by an anxiety attack  over having to bring herself here to the office. Once she came in, she is  actually feeling better. Her heart rate has slowed down. Dr. Gala Romney saw  her with me as well.   CURRENT MEDICATIONS:  1. Simvastatin 40 mg daily.  2. Lisinopril 10 mg daily.  3. Furosemide 40 mg daily.  4. Singulair 10 mg daily.  5. Lamictal 100 mg.  6. Zolpidem 10 mg daily.  7. Diltiazem 240 mg b.i.d.  8. Seroquel 100 mg in the morning, 500 mg p.r.n.  9. Alprazolam 0.5 mg p.r.n.  10.Indomethacin q.i.d.  11.Alprazolam 1 mg p.r.n.  12.Zolpidem 10 mg daily.  13.Flovent and albuterol inhalers.   PHYSICAL EXAMINATION:  GENERAL:  This is an anxious, 60 year old, white  female in no acute distress.  VITAL SIGNS:  Blood pressure 119/81, pulse 94, weight 301.  NECK:  Without JVD, __________ , bruit, or thyroid enlargement.  LUNGS:  Clear anterior, posterior and lateral.  HEART:  Regular rate and rhythm at 90 beats per  minute. Normal S1 and S2. No  rub appreciated. There was no pulseless paradox on blood pressure. No murmur  heard.  ABDOMEN:  Obese, normal active bowel sounds are heard throughout.  EXTREMITIES:  Without cyanosis, clubbing or edema. She has good distal  pulses.   EKG normal sinus rhythm, low voltage.   IMPRESSION:  1. Probably viral pericarditis with moderate pericardial effusion. Will      continue to monitor closely.  2. Hypotension, resolved.  3. Diabetes mellitus.  4. Hypertension.  5. Sleep apnea.  6. Bipolar.  7. Hypercholesterolemia.  8. History of asthma.  9. Morbid obesity.   PLAN:  Dr. Gala Romney recommended stopping her lisinopril and Lasix for now,  increasing her fluids at home. She will have a repeat 2-D echo and followup  with him on Monday.      ______________________________  Jacolyn Reedy, PA-C    ______________________________  Bevelyn Buckles. Bensimhon, MD    ML/MedQ  DD:  05/23/2006  DT:  05/24/2006  Job #:  [id number]  cc:   Bryan Lemma. Manus Gunning, M.D.

## 2011-01-07 NOTE — H&P (Signed)
[name], [name] NO.:  [id number]   MEDICAL RECORD NO.:  [id number]          PATIENT TYPE:  INP   LOCATION:  3731                         FACILITY:  [facility]   PHYSICIAN:  Gerrit Friends. Dietrich Pates, MD, FACCDATE OF BIRTH:  [birth year]   DATE OF ADMISSION:  05/25/2006  DATE OF DISCHARGE:                                HISTORY & PHYSICAL   PRIMARY CARE PHYSICIAN:  Dr. Manus Gunning.   PRIMARY CARE PHYSICIAN:  Dr. Gala Romney.   HISTORY OF PRESENT ILLNESS:  A 60 year old woman with pericardial effusion  and recently diagnosed pericarditis now returns with atrial fibrillation.  [name] was admitted approximately 1 week ago with progressive dyspnea  over a number of months and chest pain.  Echocardiography revealed and no  significant valvular abnormalities, normal left ventricular size and  function, and a moderate pericardial effusion.  A CT scan of the chest was  obtained, which verified to the presence of pericardial effusion.  There was  also mild pulmonary edema.  No specific comment was made concerning the  pericardial tissue itself.  Right and left cardiac catheterization was  equivocal for tamponade physiology.  The patient was treated with  nonsteroidals, diuretics, and ACE inhibitors and discharged.  She was  subsequently seen in the office and found to be relatively hypotensive  prompting discontinuation of furosemide and ACE inhibitor.  She noted  palpitations earlier this morning as well as increasing dyspnea with minimal  exertion and orthopnea.  She was evaluated by her primary care physician who  sent the patient to the Emergency Department where atrial fibrillation with  a rapid ventricular response was noted.  Intravenous diltiazem was started.  Subsequently, the patient has converted to sinus rhythm and feels  substantially better.   The patient is said to have diabetes but does not receive pharmacologic  therapy for this.  She has had hypertension that  has been generally well  managed as well as hyperlipidemia.  She has longstanding obesity complicated  by obstructive sleep apnea treated with a positive pressure device.  She has  asthma, which has been severe at times, and manic depressive illness.   The patient has no known allergies.  Recent medications include aspirin 81  mg daily, indomethacin, simvastatin, Singulair, Seroquel, Lamictal, Ambien,  Xanax, and diltiazem 240 mg b.i.d.  She also uses albuterol and Flovent.   SOCIAL HISTORY:  Lives alone in [place]; disabled; no tobacco nor  alcohol.   FAMILY HISTORY:  Adopted.   REVIEW OF SYSTEMS:  Generalized weakness of late, intermittent cough, near  syncope.  All other systems reviewed and are negative.   PHYSICAL EXAMINATION:  GENERAL:  On exam, pleasant overweight woman in no  acute distress.  VITAL SIGNS:  The temperature is 96.6.  Heart rate 100 and regular.  Respirations 26.  Blood pressure 140/80 with 40 mm of paradox.  The weight  is approximately 300 pounds.  O2 saturation 97% on 2 liters.  HEENT:  Anicteric sclerae; normal lids and conjunctivae; normal oral mucosa;  pupils are equal, round, react to light; EOMs full.  NECK:  Mild jugular  venous distension; normal carotid upstrokes without  bruits.  ENDOCRINE:  No thyromegaly.  HEMATOPOIETIC:  No adenopathy.  LUNGS:  Clear.  CARDIAC:  Normal first and second heart sounds; fourth heart sound present;  no rub.  ABDOMEN:  Soft and nontender; no masses; no organomegaly.  EXTREMITIES:  Mild edema.  NEUROMUSCULAR:  Symmetric strength and tone; normal cranial nerves.   Chest x-ray:  Substantial cardiomegaly; mild pulmonary edema.   EKG:  Atrial fibrillation with a rapid ventricular response; left posterior  fascicular block; borderline low voltage; slightly delayed R-wave  progression; nonspecific ST-T wave abnormality.   Other laboratory notable for normal CBC, normal chemistry profile, glucose  of 217, normal  troponin, and normal clotting studies.   IMPRESSION:  [name] returns with paroxysmal atrial fibrillation that is  resulting in substantial exacerbation of her symptoms.  It is surprising  that she is achieving rates of 180 beats per minute if she is in fact taking  diltiazem as ordered.  Intravenous diltiazem has been started.  Digoxin will  be added to her medical regime.  Beta blockers will be withheld due to her  history of significant asthma.  She has significant risk factors in terms of  hypertension and congestive heart failure for thromboembolism, but  anticoagulants will be held due to the brief duration of her arrhythmia and  the possibility of transforming her pericarditis into a hemorrhagic process.   She continues to have findings that suggest a component of pericardial  tamponade.  It would be worthwhile evacuating  her pericardium either  percutaneously or via a pericardial window to determine if hemodynamics and  symptoms will improve.   She had congestive heart failure during her last admission and appears to  have it now.  This is unusual in the setting of tamponade and may reflect an  additional process such as diastolic dysfunction.  Her echocardiogram will  be repeated both to reassess chambers involved as well as to reassess her  pericardial fluid to determine the feasibility of pericardiocentesis.  Intravenous furosemide will be given tonight, but excessive diuresis will be  avoided, as this may exacerbate tamponade.      Gerrit Friends. Dietrich Pates, MD, [facility]  Electronically Signed     RMR/MEDQ  D:  05/25/2006  T:  05/27/2006  Job:  [id number]

## 2011-01-07 NOTE — Discharge Summary (Signed)
NAMENIZHONI, PARLOW NO.:  [id number]   MEDICAL RECORD NO.:  [id number]          PATIENT TYPE:  INP   LOCATION:  2013                         FACILITY:  [facility]   PHYSICIAN:  Hillis Range, MD       DATE OF BIRTH:  11/14/1950   DATE OF ADMISSION:  02/13/2009  DATE OF DISCHARGE:  02/15/2009                               DISCHARGE SUMMARY   PRIMARY CARDIOLOGIST:  Bevelyn Buckles. Bensimhon, MD   PRIMARY CARE PHYSICIAN:  Bryan Lemma. Manus Gunning, M.D.   PULMONOLOGY:  [name]. [name], MD, [facility]   DISCHARGING DIAGNOSIS:  Chest pain.  The patient ruled out for  myocardial infarction by serial cardiac enzyme markers and 12-lead EKG.  She underwent a CT angiogram this admission that ruled out pulmonary  emboli.   Her past medical history includes:  1. Diabetes.  2. Hypertension.  3. Morbid obesity.  4. History of pleuropericarditis in 2007, status post VATS and      pericardial window.  5. Negative left heart catheterization in 2007.  6. Bipolar disorder with disability.  7. History of paroxysmal atrial fib on previous admissions.  8. Asthma.  9. Sleep apnea with CPAP.  10.Dyslipidemia.  11.Anxiety disorder.  12.History of irritable bowel syndrome.   HOSPITAL COURSE:  Ms. [name] is a 60 year old Caucasian female with past  medical history as stated above with normal cardiac catheterization in  2007, who presented this admission complaining of dyspnea with chest  discomfort.  An echocardiogram done in the emergency room showed no  pericardial effusion; however, it was a technically difficult study due  to the patient's body habitus.  Initial lab work within normal limits  including WBC, hematocrit, potassium, creatinine.  First set of cardiac  markers were negative.  It was decided to admit the patient for further  evaluation.  The patient with mildly elevated D-dimer of 0.49 with  further negative cardiac enzyme markers being within normal limits.  Following day, the patient  underwent CT angiogram, negative for  pulmonary emboli.  The patient with resolution of symptoms, stable  condition, and seen by Dr. Johney Frame on day on discharge with instructions  to continue previous medications including Singulair 10 mg daily, Xanax  1 mg at bedtime, Seroquel 800 mg at bedtime, Lamictal 150 mg at bedtime,  Lamictal 225 mg in the morning, Cozaar 100 mg daily, simvastatin 40  daily, lisinopril 20 daily, aspirin 81 daily, Klor-Con 20 mEq daily,  Ambien 10 mg p.r.n., Xanax 0.5 mg p.r.n., furosemide  20 mg p.r.n.  The patient instructed to follow up with her primary care  physician for further evaluation and with Dr. Gala Romney as previously  arranged.   Duration of discharge encounter greater than 30 minutes.      Dorian Pod, ACNP      Hillis Range, MD  Electronically Signed    MB/MEDQ  D:  02/17/2009  T:  02/18/2009  Job:  [id number]

## 2011-01-07 NOTE — H&P (Signed)
[name], Yolanda NO.:  [id number]   MEDICAL RECORD NO.:  [id number]          PATIENT TYPE:  INP   LOCATION:  2011                         FACILITY:  [facility]   PHYSICIAN:  Duke Salvia, MD, FACCDATE OF BIRTH:  Jan 11, 1951   DATE OF ADMISSION:  06/03/2006  DATE OF DISCHARGE:                                HISTORY & PHYSICAL   HISTORY:  [name] is a 60 year old obese white female who comes back to  the emergency room with progressive dyspnea.  She states that this and been  ongoing for quite a while; however, since his discharge on October 9, it has  gotten worse and worse and worse to the point that she can not even walk.  It is noted the patient is a very poor historian. In the emergency room, she  is  tachycardic and hypertensive. Chest x-ray showed worsening air space  disease and elevated D-dimer, thus her admission.   PAST MEDICAL HISTORY:  No known drug allergies.   MEDICATIONS PRIOR TO ADMISSION:  1. Aspirin 325 daily.  2. Lasix 20 daily.  3. K-Dur 20 mEq daily.  4. Digoxin 0.25 daily.  5. Zocor 40 mg nightly.  6. Singulair 10 mg daily.  7. Hectorol 100 mg daily.  8. Klonopin 10 mg daily.  9. Seroquel 100 mg daily.  10.Alprazolam p.r.n.  11.Ibuprofen 600 mg 3 times a day.   Please refer to her recent admission History and Physical examination on  May 25, 2006, for her Social, Family and Past Medical History.  Her Past  Medical History in addition to her recent HPI is notable for paroxysmal  atrial fibrillation for which she converted to sinus rhythm in the emergency  room at the time of her last admission. She is not placed on Coumadin;  however, does take an aspirin every day.   PHYSICAL EXAMINATION:  GENERAL:  Obese white female.  Poor historian, slight  respiratory distress.  VITAL SIGNS: Initial blood pressure is 181/102 now at 101/89. Pulse  initially was of 114, now 105.  NECK:  Physical exam per Dr. Graciela Husbands shows JVP below the  jaw.  LUNGS: Diminished breath sounds, particularly on the left with egophony.  HEART: Shows regular rate and rhythm without evidence of rub.  ABDOMEN: Is obese. Bowel sounds present, soft, nontender.  EXTREMITIES: She has 2-3+ edema.  Peripheral pulses are intact.   LABORATORY DATA:  Chest x-ray shows large left pleural effusion, left lung  consolidation, atelectasis, increased vascular congestion and edema,  particularly in the right lung base.  EKG shows sinus tachycardia with rate of 111, normal axis, normal intervals.  She has nonspecific ST-T wave changes, delayed R change from her admission  EKG 2004.   Hemoglobin 11.0, hematocrit 32.5, platelets 724, WBC 13.  Sodium 140,  potassium 3.9, BUN 10, creatinine 28, glucose 214. Normal LFTs. Calcium 8.4.  D-dimer was elevated 5.86.  BNP 157   IMPRESSION AND PLAN:  Dr. Graciela Husbands felt that she has multifactorial problems  with effusion and shortness of breath of uncertain etiology, elevated D-  dimer with possible pulmonary embolism.  The patient is certainly at risk.  Obesity, chronic diastolic congestive heart failure although BNP in 140  which is not significantly elevated, hypertension   DISPOSITION:  Dr. Graciela Husbands has spoken with Dr. [name].  Dr. [name] will see the  patient and make a disposition for possible thoracentesis as well as  arranging spiral CT scan for pulmonary embolism and possible heparinization.  We will admit the patient with probable transfer of service to pulmonology.  Dr. Graciela Husbands feels that she may need repeat right heart catheterization. There  is continued question of pericardial effusion contributing to her symptoms.  We will admit her to telemetry unit, continue her home medications, although  I will increase her Seroquel to 100 mg b.i.d. per Psychiatry recommendations  during her recent admission. Respiratory therapy will assist with CPAP.  Diabetes management will assist with sugar control.  Dr. Juanda Chance is to see  the  patient.     ______________________________  Joellyn Rued, PA-C    ______________________________  Duke Salvia, MD, [facility]    EW/MEDQ  D:  06/03/2006  T:  06/04/2006  Job:  [id number]   cc:   Bevelyn Buckles. Bensimhon, MD  [name]. [name], MD, FCCP  Bryan Lemma. Manus Gunning, M.D.

## 2011-01-07 NOTE — Assessment & Plan Note (Signed)
[facility]                              CARDIOLOGY OFFICE NOTE   Yolanda, [name]                      MRN:          [id number]  DATE:06/27/2006                            DOB:          20-Jul-1951    PATIENT IDENTIFICATION:  [name] is a 60 year old woman with multiple  medical problems.  She presents for post hospital followup.   PROBLEM LIST:  1. Large pericardial effusion secondary to probable viral pericarditis:      Status post pericardial window.  2. Large left pleural effusion status post chest tube drainage.  3. History of paroxysmal atrial fibrillation, single episode, now      maintaining sinus rhythm.  4. Diabetes.  5. Hypertension.  6. Morbid obesity.  7. Asthma.  8. Sleep apnea, on CPAP.  9. Bipolar disorder.  10.Hyperlipidemia.  11.Chest pain:  Cardiac catheterization showed no significant coronary      artery disease.   CURRENT MEDICATIONS:  1. Aspirin 325.  2. Lisinopril 10 b.i.d.  3. Lamictal 100 b.i.d.  4. Seroquel 400 b.i.d.  5. Xanax 1 mg t.i.d.  6. Singulair 10 a day.  7. Zocor 40 a day.  8. Flovent 110 mcg a day.   INTERVAL HISTORY:  [name] returns today for post hospital followup.  While she is in the hospital she underwent pericardial window and left VATS  for drainage of recurrent pleural effusion.  Cultures and biopsies were  negative.  We assume this is secondary to viral pericarditis.  She also  underwent aggressive diuresis and lost nearly 25 pounds of fluid.  She  returns today saying her breathing is much better.  Her weight has been  stable.  She denies any lower extremity edema.  She has not had any chest  pain, no fevers or chills.  Denies any orthopnea or PND.   PHYSICAL EXAMINATION:  GENERAL:  She is able to walk around the clinic  without any respiratory difficulty.  VITAL SIGNS:  Blood pressure is 122/84, heart rate is 112, weight is 273.  HEENT:  Sclerae anicteric, EOMI.  There are  no xanthelasmas.  Mucous  membranes are moist.  NECK:  Supple.  There is no JVD appreciated.  Carotids are 2+ bilaterally  without any bruits.  There is no lymphadenopathy or thyromegaly.  CARDIAC:  She is tachycardic and regular with no murmurs, rubs or gallops.  LUNGS:  Clear except for decreased breath sounds at the right base.  ABDOMEN:  Morbidly obese, nontender, nondistended, no hepatosplenomegaly  appreciated, no bruits, no masses.  EXTREMITIES:  Warm with no cyanosis, clubbing or edema.  No rashes, no  arthropathies.  NEUROLOGIC:  Alert and oriented x3.  Cranial nerves II-XII are intact.  She  moves all four extremities without difficulty.   EKG shows sinus tachycardia at a rate of 112 with low voltage.  Chest x-ray  shows possible mild CHF with a small left pleural effusion.   ASSESSMENT AND PLAN:  1. Pleuropericarditis secondary to probable viral pericarditis.  She is      much improved after drainage.  However, she  does remain a bit      tachycardic.  We will go ahead and check a 2-D echocardiogram.  I have      also restarted her on a little bit of Lasix for her apparent CHF on x-      ray.  2. Tachycardia.  I am unclear of the etiology of this.  We are checking      another echocardiogram.  I think it would also be reasonable to check      labs to make sure she is not anemic or has some other problem.  We will      also check her thyroid.     Bevelyn Buckles. Bensimhon, MD  Electronically Signed    DRB/MedQ  DD: 06/27/2006  DT: 06/27/2006  Job #: [id number]   cc:   [name], M.D.

## 2011-01-07 NOTE — Discharge Summary (Signed)
NAME:  Yolanda, [name]                         ACCOUNT NO.:  [id number]   MEDICAL RECORD NO.:  [id number]                   PATIENT TYPE:  IPS   LOCATION:  0508                                 FACILITY:  [facility]   PHYSICIAN:  [name] Lim, M.D.              DATE OF BIRTH:  [birth year]   DATE OF ADMISSION:  07/29/2003  DATE OF DISCHARGE:  08/01/2003                                 DISCHARGE SUMMARY   IDENTIFYING DATA:  This is a 60 year old Caucasian female, married,  voluntarily admitted with a history of bipolar disorder.  Went to the ER  with anxiety, felt like she was having a MI.  Feeling suicidal with thoughts  of overdosing on medications.  Endorsing 1-2 weeks of irritability with poor  impulse control and thought agitation.  Mood swings, feeling angry.  Referred by outpatient psychiatrist.   MEDICATIONS:  Lamictal 100 mg daily, Xanax XR 1 mg b.i.d., Seroquel 100 mg  q.h.s., Lipitor and Lasix (unclear doses), diltiazem ER 240 mg b.i.d. and  Bextra 10 mg daily.   ALLERGIES:  RISPERDAL (makes patient's legs swell up).   PHYSICAL EXAMINATION:  Essentially within normal limits.  Neurologically  nonfocal.   LABORATORY DATA:  Routine admission labs within normal limits.   MENTAL STATUS EXAM:  Fully alert, disheveled, but polite and pleasant.  Stable affect.  Speech within normal limits.  Mood depressed.  Thought  process with no flight of ideas or ideas of reference.  Positive suicidal  ideation with plan to overdose.  No agitation now but was clearly agitated  on admission.  Cognitively intact.  Questionable paranoia versus severe  anxiety and fear.   ADMISSION DIAGNOSES:   AXIS I:  1. Bipolar disorder not otherwise specified.  2. Rule out rapid cycling, acute exacerbation.   AXIS II:  None.   AXIS III:  1. Hypertension.  2. Arthritis.  3. Impaired glucose tolerance.   AXIS IV:  Moderate (problems with primary support group and other  psychosocial issues).   AXIS  V:  28/65.   HOSPITAL COURSE:  The patient was admitted and ordered p.r.n. medications  and underwent further monitoring.  Was encouraged to participate in  individual, group and milieu therapy.  The patient was optimized on Lamictal  and Seroquel to further stabilize mood and to control thought agitation.  The patient reported a decrease in mood lability and agitation.  Had no  psychotic symptoms.  Felt more calm.  Tolerated medication changes and  benefited from crisis intervention.  The patient developed healthier coping  skills and participated in developing a comprehensive aftercare plan, which  she was comfortable with.   CONDITION ON DISCHARGE:  Markedly improved.  Mood more euthymic.  Affect  brighter.  Thought processes negative for dangerous ideation or psychotic  symptoms.   DISCHARGE MEDICATIONS:  1. Lamictal 100 mg, 1-1/2 q.a.m.  2. Lexapro 20 mg q.a.m.  3. Bextra 10 mg  q.a.m.  4. Xanax XR 1 mg q.a.m. and 1 q.h.s.  5. Diltiazem 240 mg b.i.d.  6. Lipitor 10 mg at 6 p.m.  7. Lasix 40 mg q.a.m.  8. Seroquel 250 mg q.h.s. and 50-100 mg b.i.d. as needed.  9. Ambien 10 mg q.h.s. p.r.n. for insomnia.  10.      Albuterol inhaler 2 puffs q.i.d.   FOLLOW UP:  The patient was discharged to follow up on August 07, 2003  with Dr. Kathrynn Running and Patty __________  August 05, 2003 and to participate  in [facility] on August 04, 2003.   DISCHARGE DIAGNOSES:   AXIS I:  1. Bipolar disorder not otherwise specified.  2. Rule out rapid cycling, acute exacerbation.   AXIS II:  None.   AXIS III:  1. Hypertension.  2. Arthritis.  3. Impaired glucose tolerance.   AXIS IV:  Moderate (problems with primary support group and other  psychosocial issues).   AXIS V:  Global Assessment of Functioning on discharge 55.                                               [name] Lim, M.D.    JEM/MEDQ  D:  08/31/2003  T:  08/31/2003  Job:  [id number]

## 2011-01-07 NOTE — Assessment & Plan Note (Signed)
[facility]                               PULMONARY OFFICE NOTE   [name]                      MRN:          [id number]  DATE:07/12/2006                            DOB:          11/05/1950    PULMONARY/FOLLOWUP OFFICE VISIT   HISTORY:  This is a 60 year old white female, never smoker, with morbid  obesity admitted from October 13 through October 24 for evaluation of  progressive dyspnea felt to be due to fibrinous pleural pericarditis  requiring a left thoracentesis x2, left VAS with drainage of loculated left  pleural effusion and pleural biopsy. I reviewed the biopsies with her today  and only show benign fibrinous pleuritis with no evidence of granulomatous  change or effusion. I do not have the rheumatologic results of any  rheumatologic screening and apparently she has not seen a rheumatologist.  Note that she was discharged on indomethacin and states that she felt good  initially with progressive improvement in dyspnea, but comes back now with  progressively worsening pleuritic pain over the last week or so with no  associated dyspnea, cough, fever, chill, sweats, or leg swelling.   I attempted to review her medications with her, but I am not convinced that  she actually takes the medications as they are listed. They are inventoried,  however, in the column dated July 12, 2006. Note, she is using no  inhalers, but does maintain on Singulair once daily.   PHYSICAL EXAMINATION:  She is a depressed-appearing, ambulatory, obese white  female in no acute distress. She has stable vital signs.  HEENT: Is unremarkable. Oropharynx is clear.  LUNGS: Lung fields reveal minimum decrease in breath sounds at the left base  with definite pleural rub on the left.  HEART: There is a regular rate and rhythm without murmur, gallop or rub.  ABDOMEN: Soft, benign.  EXTREMITIES: Warm without calf tenderness, cyanosis, clubbing or edema.   Chest x-ray shows a small loculated process in the left base, which is not  significantly different from her post-op x-ray.   Echocardiogram was reviewed from November 14 and is essentially normal, with  no evidence of recurrent effusion and normal left ventricular function.  Chemistry profile was also normal with a normal TSH on November 14.   IMPRESSION:  The only new problem encountered today is one of recurrent left  pleuritic pain that I think has evolved since she stopped indomethacin,  which was previously effective and did not appear to have any adverse  effect. She says she cannot remember why it was stopped and did state that  she had trouble taking drugs like Advil because they bothered stomach and I  suspect this is why indomethacin was stopped as well. I am therefore going  to recommend initiating Mobic 7.5 mg b.i.d. and will do a baseline CMET, CBC  and sed rate. If there is significant elevation of sed rate, I believe,  formal rheumatology evaluation should be considered. However, in the absence  of new fever, purulent sputum, or a new localization for pain, it is very  unlikely that we  are dealing with pneumonia and even less likely that this  is related to pulmonary embolism, which would not of course go to the same  exact area where she had the previous pleuritic pain from benign fibrinous  pleuritis. At this point, she has normal left ventricular function and I am  going to also ask her stop Lasix and stop furosemide and use it p.r.n.  swelling.   I see no indication for Singulair, which I have asked her to stop and use  albuterol p.r.n. and use the amount of albuterol she needs as a barometer  as to whether or not she needs to be back on the Singulair using the  strategy the less medicine she takes, the better.     [name]. [name], MD, [facility]  Electronically Signed    MBW/MedQ  DD: 07/12/2006  DT: 07/12/2006  Job #: [id number]   cc:   [name], M.D.

## 2011-01-07 NOTE — Discharge Summary (Signed)
NAMETIEARA, FLITTON NO.:  [id number]   MEDICAL RECORD NO.:  [id number]          PATIENT TYPE:  INP   LOCATION:  5705                         FACILITY:  [facility]   PHYSICIAN:  Sherin Quarry, MD      DATE OF BIRTH:  01/01/1951   DATE OF ADMISSION:  11/04/2005  DATE OF DISCHARGE:                                 DISCHARGE SUMMARY   [name] is a 60 year old lady who was admitted to [facility]  on March 16.  At that time, she presented with profound lethargy, weakness,  and a history of recurrent falling.  She and her husband indicated that  lethargy had worsened when she was switched from Topamax and Lexapro to  Depakote by Dr. Jennelle Human in an effort to better control her symptoms of  bipolar disorder.  Her husband indicated that over the last several weeks  she had been sleeping most of the time, sometimes remaining in bed for an  entire weekend.  She had been having symptoms of fatigue and listlessness.  There was no associated headache, breathing difficulty, chest pain, nausea,  or vomiting.   PHYSICAL EXAMINATION:  GENERAL:  She was a lethargic, obese lady who was  alert and oriented but would fall asleep when not stimulated.  HEENT:  Within normal limits.  CHEST:  Clear.  CARDIOVASCULAR:  Normal S1 and S2 without murmurs, rubs, or gallops.  ABDOMEN:  Benign.  There were normal bowel sounds without masses or  tenderness.  NEUROLOGIC:  Cranial nerves, motor, sensory, and cerebellar testing were  normal.  EXTREMITIES:  No evidence of cyanosis or edema.   PSYCHIATRIC MEDICATIONS ON ADMISSION:  1.  Seroquel 400 mg b.i.d.  2.  Xanax XR 1 mg b.i.d.  3.  Xanax 0.5 mg every six hours p.r.n.  4.  Lamictal 200 mg b.i.d.  5.  Ambien p.r.n. for sleep.  6.  Depakote (dose of which is uncertain).   LABORATORY STUDIES:  On admission, laboratory studies obtained included a  valproic acid level, which was 46.6.  Her A1c hemoglobin was 6.7.  Glucose  was 140.   TSH was 3.7.  Lipid profile showed a cholesterol of 173.  The  remainder of the CMET was notable for normal renal function, electrolytes,  and liver profile.  A CT scan of the brain was performed in the emergency  room and was negative.   On the day of admission, consultation was obtained from Dr. Antonietta Breach.  Dr. Jeanie Sewer recommended admission to behavioral health for further  evaluation and treatment.  He indicated the patient was not at risk to harm  herself or others and that no sitter was required.  The patient requested  voluntary admission.  Therefore, on March 17, arrangements were made for  discharge to behavioral health.   DISCHARGE DIAGNOSES:  1.  Bipolar disorder with chronic anxiety and depression.  2.  Lethargy, confusion, and frequent falling secondary to medications.  3.  Hypertension.  4.  Morbid obesity.  5.  History of asthma.  6.  Status post cholecystectomy.   DISCHARGE MEDICATIONS:  1.  Cardizem 240 mg b.i.d.  2.  Lasix 40 mg daily.  3.  Xanax XR 1 mg t.i.d.  4.  Zocor 40 mg daily.  5.  Seroquel, the dose of which I have decreased to 100 mg in the morning      and 200 mg at bedtime.  6.  Singulair 10 mg daily.  7.  Albuterol metered-dose inhaler p.r.n. for coughing and wheezing.   Her subsequent medications will be as per psychiatric physicians.   CONDITION AT THE TIME OF DISCHARGE:  Good.           ______________________________  Sherin Quarry, MD     SY/MEDQ  D:  11/05/2005  T:  11/07/2005  Job:  [id number]   cc:   Jetty Duhamel., M.D.  Fax: [phone]   Antonietta Breach, M.D.   Bryan Lemma. Manus Gunning, M.D.  Fax: [phone]

## 2011-01-07 NOTE — Assessment & Plan Note (Signed)
[facility]                                   ON-CALL NOTE   LUETTA, PIAZZA                      MRN:          [id number]  DATE:06/04/2006                            DOB:          [birth year]    PRIMARY CARDIOLOGIST:  Dr. Gala Romney.   SUMMARY/HISTORY:  Ms. Steffek has been admitted to the hospital 2 times in  the last month for shortness of breath.  Previous evaluations have not  specifically determined etiology for her dyspnea.   Since her discharge from the hospital a few days ago, she states she  continues to be short of breath.  It has gotten so bad, to the point that  she cannot walk across the room.  She had a very difficult time speaking on  the phone while I was trying to ask her questions in regards to her symptoms  and medications.  I suggested that she call 911 to be transported to the  hospital for further evaluation of her obvious shortness of breath and  audible wheezing.   I called the emergency room to advise the ER of her pending arrival and to  please notify us if she had any active cardiac issues.   Ms. Lisa was amenable to this plan.      ______________________________  Joellyn Rued, PA-C    ______________________________  Duke Salvia, MD, [facility]     EW/MedQ  DD:  06/04/2006  DT:  06/05/2006  Job #:  [id number]

## 2011-01-07 NOTE — Discharge Summary (Signed)
[name], [name] NO.:  [id number]   MEDICAL RECORD NO.:  [id number]          PATIENT TYPE:  IPS   LOCATION:  0500                          FACILITY:  [facility]   PHYSICIAN:  [name] Lim, M.D. DATE OF BIRTH:  [birth year]   DATE OF ADMISSION:  11/05/2005  DATE OF DISCHARGE:  11/08/2005                                 DISCHARGE SUMMARY   IDENTIFYING INFORMATION:  This is a voluntarily admission for this 60-year-  old Caucasian female to the service of Dr. Kathrynn Running.  Presented to the  emergency room on March 16th complaining of being shaky and weak, stating  feeling very shaky on the inside.  Was hypertensive according to EMS, blood  glucose was elevated at 212 and known to be a non-insulin-dependent  diabetic.  Had an increase in weakness over the past several days.  Was  fearful.  Had falls.  Some confusion and felt her mood was quite unstable  and felt unsafe.  Dr. Jeanie Sewer saw her at the medical hospital and there  was a recent change in medications from Topamax and Lexapro to Depakote and  felt that she was having some kind of reaction to her medications.  Acknowledged two week history for a decrease in mood, energy, anhedonia,  hopelessness and suicidal ideation.   PAST PSYCHIATRIC HISTORY:  Admitted here in [facility] in  [facility].  Also in November of 2004.  Had been followed by Dr. Kathrynn Running and  transferred to Dr. Jennelle Human recently.   ALCOHOL/DRUG HISTORY:  No alcohol or drug history.   PRIMARY CARE PHYSICIAN:  Dr. Suzzette Righter and Dr. Jennelle Human, again, was  outpatient psychiatrist.   MEDICAL HISTORY:  Significant for obesity, non-insulin-dependent diabetes  mellitus, sleep apnea, asthma, hypertension and obesity, qualified as  morbid.   MEDICATIONS:  Diltiazem, Lasix, Xanax, Zocor, Seroquel, Singulair and  albuterol.   ALLERGIES:  No known drug allergies.   PHYSICAL EXAMINATION:  Physical and neurologic exam essentially within  normal  limits except for obesity, height 5 feet 5 inches, weight 302 pounds.   REVIEW OF SYSTEMS:  Noncontributory.   LABORATORY DATA:  Routine admission labs positive urinary tract infection  with trace amount of leukocytes.  Cipro started along with Pyridium.  Glucose 140.   MENTAL STATUS EXAM:  The patient was arousable, became alert.  Thought  processes intact.  Specifically no delusions or paranoia.  Reported  concentration and memory were intact.  Denied auditory or visual  hallucinations.  Judgment and insight were intact.  Intelligence is at least  within average range.  Speech within normal limits.  No pressure.  No  suicidal or homicidal ideation.  Again, no overt psychotic symptoms.  Cognition intact.  Judgment and insight were fair.   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  History of bipolar disorder, type 1, currently  depressed.  AXIS II:  Deferred.  AXIS III: Non-insulin-dependent diabetes mellitus, sleep apnea, using C-PAP,  asthma, hypertension, obesity.  AXIS IV:  Moderate (stressors with multiple medical issues, some agoraphobia  and isolation  and limited support system).  AXIS V:  30/55.   HOSPITAL COURSE:  The patient was admitted and ordered routine p.r.n.  medications and underwent further monitoring.  Was encouraged to participate  in individual, group and milieu therapy.  Was monitored regarding  medications.  Medications optimized.  Risk/benefit ratio and alternative  treatments regarding medications were reviewed.  Depakote was discussed  regarding being a good choice of medication.  However, it may be that other  medications need to be adjusted to be able to start this and avoid the side  effects that she had including delirium and gait instability.  The patient  rapidly improved and Dr. Alwyn Ren office was contacted regarding treatment  plan as well as family.  Husband did not have concerns regarding  discharge  plan and aftercare plan and patient was recommended to follow up very  closely in the outpatient setting and intensive outpatient to be seen daily  for close follow-up until behavioral changes could be made that would help  her sleep at night and stabilize her mood further.  The patient was educated  regarding healthy behavioral changes that need to be made with her condition  in order for her to do best and be on the least amount of medications as  possible so that she does not have side effects and is able to work in  therapy on agoraphobia, anxiety and coping skills.   CONDITION ON DISCHARGE:  The patient was discharged in improved condition  with no safety issues.  Medications education given at the time of discharge  to continue as previously prescribed.   DISCHARGE MEDICATIONS:  1.  Diltiazem.  2.  Lasix.  3.  Singulair.  4.  Nasonex.  5.  Lamictal 100 mg q.a.m. and 50 mg at 8 p.m.  6.  Xanax XR 1 mg t.i.d.  7.  Xanax 1 mg short-acting 1/2-1 every 12 hours p.r.n. panic.  8.  Ambien 10 mg q.h.s.  9.  Seroquel 100 mg, 1 q.a.m. and 500 mg at 8 p.m.   The patient was advised to minimized doses during the day since she sleeps  up to 20 hours of 24 and then she must get up at the same time and keep  herself up and reduce medications to allow her to do this and then  medications can be optimized by increasing nighttime dose if sleep becomes  an issue.  Intensive outpatient was prior authorized and patient was  discharged in improved condition with no safety issues.   DISCHARGE DIAGNOSES:  AXIS I:  Mood disorder secondary to psychotropic  changes and side effects.  History of bipolar disorder, type 1, currently  depressed.  AXIS II:  Deferred.  AXIS III: Non-insulin-dependent diabetes mellitus, sleep apnea, using C-PAP,  asthma, hypertension, obesity. AXIS IV:  Moderate (stressors with multiple medical issues, some agoraphobia  and isolation and limited support   system).  AXIS V:  GAF on discharge 55.   FOLLOW UP:  The patient is to follow up with Dr. Jennelle Human on November 23, 2005 at  1:15 p.m.      [name] Lim, M.D.  Electronically Signed     JEM/MEDQ  D:  11/22/2005  T:  11/24/2005  Job:  [id number]

## 2011-01-07 NOTE — H&P (Signed)
NAMECLODAGH, ODENTHAL NO.:  [id number]   MEDICAL RECORD NO.:  [id number]          PATIENT TYPE:  INP   LOCATION:  5705                         FACILITY:  [facility]   PHYSICIAN:  Sherin Quarry, MD      DATE OF BIRTH:  23-Oct-1950   DATE OF ADMISSION:  11/04/2005  DATE OF DISCHARGE:  11/05/2005                                HISTORY & PHYSICAL   [name] is a 60 year old lady who indicates that she was first  diagnosed with bipolar disorder when she was admitted [facility]  about 2 years ago.  However, she says that she has a lifelong history of  depression and anxiety.  She is currently under the care of Dr. Jennelle Human of  the psychiatry department.  The patient unfortunately is rather lethargic  and it is difficult to obtain a coherent history from her.  Her husband  attempts to supply details, but he is not certain about some of the details  himself.  Apparently, on Friday, i.e. one week ago, Dr. Jennelle Human made a change  in the patient's medicine, in that he advised her to stop Topamax and  Lexapro, and take Depakote.  He supplied her with samples of the Depakote to  begin this medication.  According to the patient, he told to take 1 tablet a  day for 5 days, then increase it to 2 tablets a day.  I asked to [name]  if it was possible that she was taking the Depakote in some other way and  she says that she does not remember.  For the last 3 days, her husband has  noticed that she has been slurring her speech, has been very unsteady on her  feet, and has been falling frequently.  She has bruises on her arms from  previous falls.  Apparently, today she fell and it was not possible to get  her back on her feet.  She was, therefore, brought into the emergency room  at about 10.  Since arriving in the emergency room, she has had a CT scan of  the brain which was normal.  A blood pressure obtained, on admission, was  135/76.  The patient's husband says that she  is always very sleepy and  lethargic which he attributes to her medications.  He says that it is not  uncommon for her to remain in bed for an entire weekend.  He says that she  has been told in the past by psychiatrist that she has the choice of either  sleeping all the time or else having severe anxiety.  She has not had any  recent problems with headache.  She says that her vision seems somewhat  blurry.  Her husband has noticed that she seems to have poor recall of  recent events.  She has not had any breathing trouble, coughing, chest pain,  nausea, vomiting, or abdominal pain.   PAST MEDICAL HISTORY:   MEDICATIONS:  These apparently consist of:  1.  Diltiazem 240 mg b.i.d.  2.  Singulair 10 mg daily.  3.  Seroquel 400 mg  b.i.d.  4.  Lamictal 200 mg b.i.d.  5.  Xanax XR 1 mg t.i.d. as well as Xanax 0.5 mg b.i.d. p.r.n.  6.  Ambien 5-10 mg at bedtime p.r.n. for sleep.  7.  Lasix 40 mg b.i.d.  8.  Zocor 40 mg daily.  9.  Lasix 40 mg b.i.d.   OPERATIONS:  She has had a:  1.  Previous cholecystectomy.  2.  Hysterectomy.  3.  A foot operation.   MEDICAL ILLNESSES:  1.  Hypertension.  The patient states that she has a 3-year history of      hypertension.  She denies any previous history of heart disease or      stroke.  2.  Asthma.  The patient states that when she gets a cold, she will develop      coughing and wheezing and will use an albuterol nebulizer under those      circumstances.  3.  Diabetes.  She was advised by Dr. Manus Gunning that she had evidence of      diabetes.  She does not apparently take any medicine for this problem.   FAMILY HISTORY:  The patient states that she is adopted.  She says that she  knows a little that about her family history and knows that several of her  blood relatives have diabetes.   SOCIAL HISTORY:  She indicates that she does not smoke or abuse alcohol.  She lives with her husband who expresses a lot of concern about her chronic   sleepiness.   REVIEW OF SYSTEMS:  Otherwise negative.   PHYSICAL EXAMINATION:  HEENT:  Within normal limits.  CHEST:  Clear.  BACK:  Reveals no CVA or point tenderness.  CARDIOVASCULAR:  Reveals normal S1-S2.  There are no rubs, murmurs, or  gallops.  ABDOMEN:  Is very obese.  There are normal bowel sounds.  There are no  masses or tenderness.  No guarding or rebound.  NEUROLOGIC:  Testing, the patient is a very lethargic woman who has slurred  speech.  She will fall asleep if not stimulated.  When she answers  questions, she will initially seem appropriate but then will tend to either  drift off or drift on to a tangential subject.  Cranial nerves, motor,  sensory, and cerebellar testing are normal.   LABORATORY STUDIES:  Sodium 142, potassium 3.6, glucose 195.  Hemoglobin  14.3.  As mentioned, a CT scan of the brain was normal.   IMPRESSION:  I suspect the patient's symptoms are probably related to her  psychiatric medications.   1.  We will admit her for observation.  2.  Administer intravenous fluids.  3.  Monitor her blood sugar and blood pressure to minimize sedative      medications.  4.  Then follow her and see how she does.  5.  I have asked Dr. Jeanie Sewer to see the patient.           ______________________________  Sherin Quarry, MD     SY/MEDQ  D:  11/04/2005  T:  11/05/2005  Job:  [id number]   cc:   Bryan Lemma. Manus Gunning, M.D.  Fax: [phone]   Jetty Duhamel., M.D.  Fax: [phone]

## 2011-01-07 NOTE — Discharge Summary (Signed)
[name], BIESER NO.:  [id number]   MEDICAL RECORD NO.:  [id number]          PATIENT TYPE:  INP   LOCATION:  3731                         FACILITY:  [facility]   PHYSICIAN:  Joellyn Rued, PA-C     DATE OF BIRTH:  [birth year]   DATE OF ADMISSION:  05/25/2006  DATE OF DISCHARGE:  05/30/2006                           DISCHARGE SUMMARY - REFERRING   DATE OF ADMISSION:  05/25/2006, [name]. Yolanda Pates, MD, [facility]   DATE OF DISCHARGE:  05/30/2006, [name]. Bensimhon, MD   BRIEF HISTORY:  [name] is a 60 year old white female who was discharged  home after being hospitalized for chest discomfort, underwent a cardiac  catheterization, was diagnosed with pericarditis.  This past Monday she  developed weakness, shortness of breath and chest discomfort associated  lightheadedness and dizziness and has felt bad all week.  On the day of  presentation she felt much worse, particularly short of breath and  orthopneic.  She went to her primary care physician who checked her blood  pressure and sent her to the emergency room.  In the emergency room she is  found to be in atrial fibrillation and received a Cardizem bolus.   PAST MEDICAL HISTORY:  Notable for:  1. Diabetes.  2. Hypertension.  3. Obesity.  4. Hyperlipidemia.  5. Recent diagnosis of pericarditis.  6. Obstructive sleep apnea for which she uses CPAP.  7. Bipolar disorder.  8. Asthma.   LABORATORY DATA:  Admission TSH was 5.63, free T4 was 0.98 with a free T3 of  2.0.  BNP on admission was 129, on 05/27/2006 was 132, on 05/29/2006 was 94.  Admission sodium was 135, potassium 8, BUN 14, creatinine 1.3, glucose 217.  Subsequent chemistry did show potassium at 3.3 on 05/26/2006, however prior  to discharge on 05/29/2006, it was 4.0.  Normal LFTs on 05/26/2006.  Admission  PT was 16.6, PTT 31.  Admission H&H was 12.4 and 37.2, normal indices,  platelets 549,000.  WBC 14.5.  Prior to discharge H&H was 10.4 and 30.8,  normal indices, platelets 479,000, WBC 12.4.  Admission chest x-ray showed  cardiomegaly, vascular congestion with left lower lobe atelectasis and  consolidation, right density in the left lower lobe may have slightly  increased in degree.  Initial EKG was suspicious for atrial fibrillation  with a ventricular rate of 152.  Subsequent EKGs continued to show atrial  fibrillation, however prior to discharge on 05/27/2006, she was in normal  sinus rhythm, right axis deviation, delayed R-wave.   HOSPITAL COURSE:  [name] admitted to [facility] with new  onset atrial fibrillation, pericardial effusion with significant paradox and  equivocal cath findings.  Dr. Dietrich [name] felt that she needed a drainage  procedure.  Overnight she continued to have some shortness of breath and  wheezing but her lungs were clear. Echocardiogram was obtained, this  revealed an EF of 65%, mild LVH, left atrial enlargement, moderate  pericardial effusion in the posterior lateral direction, poor quality  Doppler and image quality.  RV was small but could not assess for tamponade,  suggest right  heart cath.  Tamponade is serious clinical question,  appearance of similar echo.  She did receive some Lasix for additional  diuresis and continued on her home medications.  With ambulation, nursing  noted that her sats would drop to the 80s.  They also noted respiratory  therapy noted that patient refused CPAP secondary to panic attacks.  Dr.  Gala [name] on 05/29/2006, noted that he felt that much of her dyspnea was  chronic, no overt signs of tamponade.  Psych was asked to see in regards to  her panic attacks.  Dr. Gala [name] felt that she may need home O2.  Diabetes  team assisted with management.  Evaluation with psych was performed on  05/29/2006.  Psychiatry made some recommendations.  By 05/30/2006, Dr.  Gala [name] felt that she could be discharged home with outpatient follow up.   DISCHARGE DIAGNOSES:  1.  Moderate pericardial effusion, no clear evidence of tamponade.  2. Dyspnea.  3. Bipolar disorder/anxiety.  4. Hyperlipidemia.  5. Hyperglycemia with a history of diabetes.  6. Morbid obesity.  7. Paroxysmal atrial fibrillation converting to normal sinus rhythm, does      not need anticoagulation at this time.  History is noted below.   DISPOSITION:  The patient was discharged home to maintain a low salt, fat,  cholesterol ADA diet.  Activities were not restricted.  The patient is asked  to bring all medications to all appointments.   DISCHARGE MEDICATIONS:  1. Aspirin 325 mg daily.  2. Lasix 20 mg daily.  3. K-Dur 20 mEq daily.  4. Digoxin 0.125 daily.  5. Simvastatin 40 mg each bedtime.  6. Singular 10 mg daily.  7. Lamictal 100 mg daily.  8. Zolpidem 10 mg each bedtime.  9. Seroquel 100 mg daily.  10.Alprenolol 1 mg p.r.n.  11.Ibuprofen 600 mg three times daily.   FOLLOWUP:  She was asked to follow up with Dr. Gala [name] on 06/12/2006, at  10:45, and to arrange outpatient follow up with her primary care physician.  She was advised not to take lisinopril.  She will have an echocardiogram on  06/06/2006, at 7:30.  She was not to continue taking her Cortisone CD 240.           ______________________________  Joellyn Rued, PA-C     EW/MEDQ  D:  06/03/2006  T:  06/03/2006  Job:  [id number]   cc:   [name]. Bensimhon, MD  Bryan Lemma. Manus Gunning, M.D.

## 2011-01-07 NOTE — H&P (Signed)
NAMEMADISON, [name] NO.:  [id number]   MEDICAL RECORD NO.:  [id number]          PATIENT TYPE:  IPS   LOCATION:  0500                          FACILITY:  [facility]   PHYSICIAN:  [name] Lim, M.D. DATE OF BIRTH:  May 08, 1951   DATE OF ADMISSION:  11/05/2005  DATE OF DISCHARGE:                         PSYCHIATRIC ADMISSION ASSESSMENT   IDENTIFYING INFORMATION:  This is a voluntary admission to the services of  Dr. Aleatha Borer.  This is a 60 year old married white female.  Apparently,  she presented to the emergency room on November 04, 2005 around 10:00 in the  morning complaining that she was shaky and weak.  Specifically stating that  she was shaky inside.  She was hypertensive according to the EMS.  Her blood  glucose was elevated at 212.  She is known to be a non-insulin-dependent  diabetic.  She stated that she has been having an increase in weakness over  the past several days.  She has had falls at home.  She denies any trauma  from the falls.  She was ambulatory to the bathroom.  Dr. Jeanie Sewer saw her  in consult and apparently she is known to have bipolar illness.  She  reported a recent change in her medications from Topamax and Lexapro to  Depakote and she feels that she is having some kind of reaction to her  medications.  She acknowledged a two-week history for a decrease in mood,  energy, anhedonia, hopelessness and suicidal ideation.   PAST PSYCHIATRIC HISTORY:  The patient reports that she was admitted here to  the [facility] in 1984, also in November of 2004.  She is  currently followed on an outpatient basis by Dr. Jennelle Human.   SOCIAL HISTORY:  She graduated high school in 1972.  She has been on  disability since August of 2006 for her bipolar illness.  She has been  married once.  She has a 9 year old son, a 33 year old daughter.   FAMILY HISTORY:  She states everybody is bipolar.  The patient was adopted  but was able to find her  biological family in 76.   ALCOHOL/DRUG HISTORY:  She denies alcohol, drugs or smoking.   PRIMARY CARE PHYSICIAN:  Dr. Suzzette Righter and Dr. Jennelle Human is her outpatient  physician.   MEDICAL HISTORY:  She has non-insulin-dependent diabetes mellitus, sleep  apnea, asthma, hypertension, and morbid obesity.   MEDICATIONS:  Her currently prescribed medications are:  1.  Diltiazem 240 mg p.o. b.i.d.  2.  Lasix 40 mg p.o. q.d.  3.  Xanax 1 mg t.i.d.  4.  Zocor 40 mg p.o. at 1800.  5.  Seroquel 100 mg q.a.m. and 200 mg q.h.s.  6.  Singulair 10 mg p.o. q.d.  7.  Albuterol inhaler q.i.d. p.r.n.   ALLERGIES:  She has no known drug allergies.   POSITIVE PHYSICAL FINDINGS:  She is morbidly obese.  She had a C-PAP machine  that she was using at the time I saw her.  Her admission vital signs show  her to be 5 feet 5 inches, weight 302 pounds, temperature 98.6,  blood  pressure 164/76 to 106/58, pulse ranged from 66-84, respirations are 20.   LABORATORY DATA:  She has a UTI.  She had a trace amount of leukocytes.  We  will go ahead and start some Cipro along with Pyridium.  She was reporting  burning.  Her glucose was 140.  The patient states that she is starting to  gain weight again due to the Seroquel and is very interested in having that  medication changed.   MENTAL STATUS EXAM:  She was arousable.  She became alert.  Her thought  processes were intact.  Specifically, there was no delusions, no paranoia.  Her concentration and memory are intact.  She denies auditory or visual  hallucinations.  Judgment and insight were intact.  Intelligence was at  least average.  Her speech was normal rate and rhythm.  There was no  pressure.   DIAGNOSES:  AXIS I:  Mood disorder secondary to psychotropic changes with  side effects.  Historically, she has been diagnosed as bipolar, currently  depressed.  AXIS II:  Deferred.  AXIS III:  Non-insulin-dependent diabetes, sleep apnea for which she uses C-   PAP, asthma, hypertension and obesity.  AXIS IV:  General medical issues and primary support.  AXIS V:  30.   PLAN:  As the patient has had several days for decrease in motor,  specifically being neurovegetative and being depressed and having passive  suicidal ideation, not responding to outpatient treatment and adverse  effects from her latest medication changes, it was felt that admission was  indicated.  She will be admitted for safety and stabilization.  We will  treat her UTI.  We will get her current medication history from Dr. Jennelle Human  and make appropriate changes.      Mickie Leonarda Salon, P.A.-C.      [name] Lim, M.D.  Electronically Signed    MD/MEDQ  D:  11/06/2005  T:  11/07/2005  Job:  [id number]

## 2011-01-07 NOTE — Op Note (Signed)
Yolanda, [name] NO.:  [id number]   MEDICAL RECORD NO.:  [id number]          PATIENT TYPE:  INP   LOCATION:  2303                         FACILITY:  [facility]   PHYSICIAN:  [name], M.D.  DATE OF BIRTH:  [birth year]   DATE OF PROCEDURE:  06/06/2006  DATE OF DISCHARGE:                                 OPERATIVE REPORT   OPERATION:  1. Subxiphoid pericardial window.  2. Left video-assisted thoracoscopy with drainage of loculated effusion      and pleural biopsy.   PREOPERATIVE DIAGNOSIS:  Pericarditis, pleuritis with recurrent effusion.   POSTOPERATIVE DIAGNOSIS:  Pericarditis, pleuritis with recurrent effusion.   SURGEON:  [name], M.D.   ASSISTANT:  Lujean Rave, P.A.-C.   ANESTHESIA:  General.   INDICATIONS:  The patient is a 60 year old obese diabetic female who  presented originally with chest pain and underwent a normal left and right  heart catheterization.  A 2-D echo showed a small pericardial effusion.  She  returned with increasing discomfort and shortness of breath and was found to  have a moderate to large left pleural effusion and a moderate pericardial  effusion.  Thoracentesis of the left pleural effusion was performed twice  with recurrence.  The pericardial effusion was measured at over 2 cm in  width.  She was felt to be candidate for pericardial window with drainage of  the effusion and biopsy the pericardium and left VATS and drainage of the  pleural effusion with pleural biopsy.   Prior to surgery, I discussed the procedure of subxiphoid pericardial window  and left VATS including the use of general anesthesia, the location of the  incisions, the plan to use postoperative chest tube drainage systems for  both the pericardial and pleural spaces, and the expected postoperative  recovery.  I reviewed the risks to her of this procedure including the risks  of MI, CVA, bleeding, infection, pulmonary problems due to her  obesity and  ventilator dependence.  After reviewing these issues, she demonstrated her  understanding and agreed to proceed with operation as planned under what I  felt was an informed consent.   OPERATIVE FINDINGS:  The pericardial window demonstrated evidence of acute  pericarditis with a fibrinous exudate over the epicardium of the heart and a  thickened inflamed pericardium.  Two hundred and fifty mL of fluid was  drained.  The left pleural space was drained at 1.1 liters.  Specimens were  sent of both the pericardium, pleura, pericardial fluid and pleural fluid  for culture and pathology.   PROCEDURE:  The patient was brought to operative room and placed supine on  the operating table.  General anesthesia was induced via a double-lumen  endotracheal tube which was positioned and confirmed by the  anesthesiologist.  The chest and abdomen were prepped and draped as a  sterile field.  A small incision was made and centered on the xiphoid  process and taken down to the deep abdominal wall fat layer.  The xiphoid  was excised.  The lower part of the sternum was removed with a rongeur.  The  sternum was elevated with the Rultract retractor.  The subxiphoid space was  dissected, and the anterior pericardium identified.  The pericardium was  incised with a 15 scalpel, and bloody fluid was drained.  A 2-cm window was  created in the anterior lower pericardium, and this tissue was removed after  excision and submitted for pathology.  The fluid was submitted for culture.  A 32-French Blake drain was passed through a separate stab wound into the  posterior pericardial space and secured to the skin.  The subxiphoid  incision was then closed in layers after hemostasis was confirmed.  A  sterile dressing was applied.   The patient was then turned in the left lateral position.  The left chest  was prepped and draped as a sterile field.  A left VATS procedure was then  performed.  A small  incision was made in the mid scapular line with a trocar  insertion into the pleural space.  Immediately, 1 liter of fluid was  drained.  The camera was inserted and the pleural space examined.  There was  no evidence of pleural nodularity or masses.  The lung appeared to be  inflamed, and the parietal pleura appeared to be mildly inflamed.  A second  VATS portal incision was made in the anterior axillary line at approximately  the sixth interspace.  Through this, a suction port was placed to open all  the loculated collections of fluid for complete drainage of the pleural  space.  Through this space, a 36-French chest tube was then placed  posteriorly along the paraspinal gutter to the apex of the left chest.  The  chest tube was secured to the skin with silk sutures.  The lung was then  reinflated under direct vision.  The original VATS portal incision was  closed in layers using Vicryl.  Sterile dressings were applied.  The patient  was then turned, and because of persistent sedation and a high pCO2, she was  not extubated, but rather the double-lumen tube was exchanged for single-  lumen tube, and the patient was transported to the recovery room in stable  condition.      [name], M.D.  Electronically Signed     PV/MEDQ  D:  06/06/2006  T:  06/07/2006  Job:  [id number]   cc:   CVTS Office  Casimiro Needle B. [name], MD, [facility]  [facility]

## 2011-01-07 NOTE — Consult Note (Signed)
[name], [name] NO.:  [id number]   MEDICAL RECORD NO.:  [id number]          PATIENT TYPE:  INP   LOCATION:  2011                         FACILITY:  [facility]   PHYSICIAN:  [name]. [name], MD, FCCPDATE OF BIRTH:  11-Oct-1950   DATE OF CONSULTATION:  06/03/2006  DATE OF DISCHARGE:                                   CONSULTATION   REFERRING PHYSICIAN:  Duke Salvia, MD, [facility].   CHIEF COMPLAINT:  Dyspnea and chest pain.   HISTORY:  This is a 60 year old white female with a history of bipolar  disorder.  She was seen initially on September 26th-27th by [facility]  [facility] for evaluation of new onset chest pain.  She was found to have a  pericardial effusion.  Workup did not show definite tamponade.  She was  treated with nonsteroidals.  Her course was complicated by atrial  fibrillation and hypotension while undergoing diuresis, afterload reduction,  and treatment of hypertension with ACE inhibitors.  She comes back to the  hospital now, acutely worse over the last several days with orthopnea and  dyspnea with minimal activity and was found have a massive left pleural  effusion and I was asked to see her.  She denies any new pleuritic pain,  significant sputum production, fevers, chills, sweats.  She does have mild  leg swelling.   PAST MEDICAL HISTORY:  1. Obesity.  2. Bipolar disorder.  3. Hypertension.  4. Anxiety.  5. Chronic rhinitis.  6. Hyperlipidemia.   MEDICATIONS:  Aspirin, indomethacin Zocor, Singulair, Seroquel, Lamictal,  Ambien, Xanax, diltiazem, albuterol and Flovent.   SOCIAL HISTORY:  She is disabled.  She denies any significant alcohol or  tobacco history.   FAMILY HISTORY:  She is adopted and does not know anything about her  biologic parents.   REVIEW OF SYSTEMS:  Taken in detail and essentially negative, except as  outlined above.   PHYSICAL EXAMINATION:  GENERAL:  This is an obese white female, who failed  to answer a single  question I asked her in a straightforward fashion.  VITAL SIGNS:  She is afebrile with normal vital signs.  HEENT:  Unremarkable.  Oropharynx clear.  Dentition intact.  NECK:  Supple without cervical adenopathy or tenderness.  LUNGS:  There are decreased breath sounds on the left versus the right.  There were no bronchial changes.  HEART:  There is a regular rhythm without murmur, gallop, or rub.  S1 S2  were diminished.  ABDOMEN:  Obese, soft, benign.  EXTREMITIES:  Warm without calf tenderness, cyanosis, clubbing.  There was  trace edema.   LABORATORY DATA:  Chest x-ray showed a massive left pleural effusion.  D-  dimer 5.86.  A C-MET showing an albumin level of 2.5.  A BNP of 157.  A CBC  revealing no significant eosinophilia.  The most recent thyroid function  tests were normal on October 7.   IMPRESSION:  Massive left pleural effusion that has occurred in the setting  of an undiagnosed pericardial effusion while taking nonsteroidals.  The  differential diagnoses include occult rheumatologic disease which would  cause both pericardial and pleural processes such as rheumatoid arthritis or  lupus and also malignancy.  Idiopathic pericarditis may cause enough  adjacent inflammation to cause pleural fluid secondary to the pericardial  process but I would be surprised if this were the case having it reported  that she has been on indomethacin since the onset of the problem.   At this point, I recommend that she undergo a left thoracentesis and tap  this dry and send this for the usual studies plus cytology and cell count  differential, and after the thoracentesis obtain a CT scan to take a better  look at the underlying lung parenchyma and rule out pulmonary embolism (I  think it is very unlikely that the D-dimer represents pulmonary embolism.  However, given the fact that the original CT scan, dated September 26,  showed no evidence of PE and was a good study then. Note, also the  absence  of adenopathy on that study).  Ruling against malignancy.           ______________________________  [name] [name], MD, [facility]     MBW/MEDQ  D:  06/03/2006  T:  06/04/2006  Job:  [id number]   cc:   Bryan Lemma. Manus Gunning, M.D.

## 2011-01-07 NOTE — Cardiovascular Report (Signed)
[name], [name] NO.:  [id number]   MEDICAL RECORD NO.:  [id number]          PATIENT TYPE:  INP   LOCATION:  4709                         FACILITY:  [facility]   PHYSICIAN:  Arturo Morton. Riley Kill, MD, FACCDATE OF BIRTH:  [birth year]   DATE OF PROCEDURE:  05/17/2006  DATE OF DISCHARGE:                              CARDIAC CATHETERIZATION   INDICATIONS:  The patient is a 60 year old woman who presents with chest  pain and an abnormal EKG.  There is ST elevation in the inferior leads with  upward concave ST segments.  She was brought to the lab emergently following  a code STEMI in the emergency room and direction by the emergency room  physician to proceed to the cath lab.  She was also seen by Dr. Gala Romney.  We did an I stat at the beginning of the case with a creatinine 1.0,  hematocrit 42.   PROCEDURE:  1. Left heart catheterization.  2. Selective coronary arteriography.  3. Selective left ventriculography.  4. Aortic root aortography in biplane.   DESCRIPTION OF PROCEDURE:  The patient was brought to the catheterization  laboratory and prepped and draped in usual fashion.  Through an anterior  puncture the femoral artery was easily entered.  A 6-French sheath was  placed.  Views of the left and right coronaries were then obtained in  multiple angiographic projections.  Ventriculography was also performed as  was aortography as was aortography.  The aortogram was done in biplane to  rule out a dissection.  The patient continued to have some inspiratory chest  pain.  ACT was 207 at the completion of the case and she was taken to the  holding area in satisfactory clinical condition. The case was difficult in  large part due to the patient's large abdominal pannus which overhung the  femoral area.  The sheath was nearly exited at the time of removal.  We held  for greater than 30 minutes with what appeared to be excellent hemostasis.   HEMODYNAMIC DATA.:  1. The  initial central aortic pressure 127/82, mean 102.  2. Left ventricular pressure 131/88.  3. No definite gradient on pullback across aortic valve.   ANGIOGRAPHIC DATA:  1. The left main coronary artery appears free of critical disease.  2. Left anterior descending artery courses to the apex.  There is minor      luminal irregularity.  There are three major diagonal branches all of      which have a little bit of pruning at the distal end but the appearance      of diabetic arteries.  No areas of critical stenoses are noted.  The      circumflex provides what appears to be an AV circumflex on a marginal      branch which bifurcates.  These course posteriorly, and no definite      abnormality is seen.  3. The right coronary artery has an anterior takeoff.  With the anterior      takeoff no definite stenosis is visualized.  There is a posterior  descending and posterolateral system.  Ventriculography demonstrates      what appears to be vigorous global systolic function with an ejection      fraction in excess of 70%.  There is hyperdynamic function and no      definite wall motion abnormality.  4. Proximal aortic root in the LAO projection shows no definite evidence      of dissection.  There is no significant aortic regurgitation.  5. In the RAO projection, likewise, the double density anteriorly seen on      ventriculography appears to be related to the anterior take off of the      right coronary artery.   CONCLUSION:  1. Hyperdynamic LV function.  2. No obvious evidence of aortic dissection.  3. No evidence of definite coronary obstruction.   The patient continues to have chest pain of uncertain etiology.  We will try  to proceed with a chest x-ray as well as the 2-D echo at the bedside.  Dr.  Gala Romney will see the patient and continue to follow her.      Arturo Morton. Riley Kill, MD, [facility]  Electronically Signed     TDS/MEDQ  D:  05/17/2006  T:  05/19/2006  Job:  [id number]    cc:   Bevelyn Buckles. Bensimhon, MD

## 2011-07-19 ENCOUNTER — Encounter (INDEPENDENT_AMBULATORY_CARE_PROVIDER_SITE_OTHER): Payer: Self-pay

## 2011-07-19 ENCOUNTER — Encounter (INDEPENDENT_AMBULATORY_CARE_PROVIDER_SITE_OTHER): Payer: Self-pay | Admitting: General Surgery

## 2011-07-19 ENCOUNTER — Ambulatory Visit (INDEPENDENT_AMBULATORY_CARE_PROVIDER_SITE_OTHER): Payer: BC Managed Care – PPO | Admitting: General Surgery

## 2011-07-19 VITALS — BP 122/78 | HR 64 | Temp 97.3°F | Resp 18 | Ht 65.0 in | Wt 238.2 lb

## 2011-07-19 DIAGNOSIS — K432 Incisional hernia without obstruction or gangrene: Secondary | ICD-10-CM

## 2011-07-19 DIAGNOSIS — K469 Unspecified abdominal hernia without obstruction or gangrene: Secondary | ICD-10-CM

## 2011-07-19 LAB — COMPREHENSIVE METABOLIC PANEL
ALT: 10 U/L (ref 0–35)
AST: 16 U/L (ref 0–37)
Albumin: 4.2 g/dL (ref 3.5–5.2)
Alkaline Phosphatase: 78 U/L (ref 39–117)
BUN: 24 mg/dL — ABNORMAL HIGH (ref 6–23)
Potassium: 4.3 mEq/L (ref 3.5–5.3)
Sodium: 143 mEq/L (ref 135–145)

## 2011-07-19 NOTE — Progress Notes (Signed)
Patient ID: [name], female   DOB: 1951-06-18, 60 y.o.   MRN: [id number]  Chief Complaint  Patient presents with  . New Evaluation    eval of hernia and discuss sx    HPI [name] is a 60 y.o. female.   HPI 18 yof previously seen by Dr. Zachery Dakins in our practice for incisional hernia at site of open chole in remote past.  He asked her to lose weight which she has lost quite a bit and is now below 240 pounds.  She has some vague abdominal pain that may be related to her hernia.  She reports that over the last two years this hernia has gotten much bigger and is causing her some more discomfort over that time.  It is always relieved by lying down.  She comes in today to discuss repair as she thinks she has lost enough weight and it is becoming more symptomatic.   She does have a signficant medical history including CHF (I do not have any other information on this today), asthma, HTN, DM and what sounds like a prior sternotomy for drainage of pericardial and pleural effusion in October of 2010.  She states that she is doing well overall now.  She does use CPAP nightly for her OSA.  Past Medical History  Diagnosis Date  . Arthritis   . Asthma   . Diabetes mellitus   . CHF (congestive heart failure)   . Hypertension   . Hyperlipidemia   . Pericarditis, viral 2010 October  . Depression   . Anxiety   . Bipolar 1 disorder     Past Surgical History  Procedure Date  . Abdominal hysterectomy   . Foot surgery     bone spurs   . Cholecystectomy 1984    open    History reviewed. No pertinent family history.  Social History History  Substance Use Topics  . Smoking status: Never Smoker   . Smokeless tobacco: Never Used  . Alcohol Use: No    Allergies  Allergen Reactions  . Lithium     Shakes and delusional pt started seeing things      Current Outpatient Prescriptions  Medication Sig Dispense Refill  . ALPRAZolam (XANAX XR) 1 MG 24 hr tablet Take 1 mg by mouth  every morning.        [name] ALPRAZolam (XANAX) 0.5 MG tablet Take 0.5 mg by mouth as needed.        [name] aspirin 81 MG tablet Take 81 mg by mouth daily.        [name] etodolac (LODINE) 400 MG tablet Take 400 mg by mouth daily as needed.        . furosemide (LASIX) 20 MG tablet Take 20 mg by mouth daily as needed.        . lamoTRIgine (LAMICTAL) 150 MG tablet Take 150 mg by mouth 2 (two) times daily. 1 and 1/2 tablet BID       . lisinopril (PRINIVIL,ZESTRIL) 20 MG tablet Take 20 mg by mouth daily.        [name] losartan (COZAAR) 100 MG tablet Take 100 mg by mouth daily.        . potassium chloride (KLOR-CON) 20 MEQ packet Take 20 mEq by mouth 2 (two) times daily.        . QUEtiapine (SEROQUEL XR) 300 MG 24 hr tablet Take 300 mg by mouth at bedtime.        . risperiDONE (RISPERDAL) 2 MG tablet  Take 2 mg by mouth daily.        . simvastatin (ZOCOR) 40 MG tablet Take 40 mg by mouth at bedtime.        [name] zolpidem (AMBIEN) 10 MG tablet Take 10 mg by mouth at bedtime as needed.          Review of Systems Review of Systems  Constitutional: Negative for fever, chills and unexpected weight change.  HENT: Positive for congestion. Negative for hearing loss, sore throat, trouble swallowing and voice change.   Eyes: Negative for visual disturbance.  Respiratory: Negative for cough and wheezing.   Cardiovascular: Positive for chest pain and leg swelling. Negative for palpitations.  Gastrointestinal: Negative for nausea, vomiting, abdominal pain, diarrhea, constipation, blood in stool, abdominal distention and anal bleeding.  Genitourinary: Negative for hematuria, vaginal bleeding and difficulty urinating.  Musculoskeletal: Negative for arthralgias.  Skin: Negative for rash and wound.  Neurological: Negative for seizures, syncope and headaches.  Hematological: Negative for adenopathy. Does not bruise/bleed easily.  Psychiatric/Behavioral: Negative for confusion.    Blood pressure 122/78, pulse 64, temperature 97.3 F  (36.3 C), temperature source Temporal, resp. rate 18, height 5\' 5"  (1.651 m), weight 238 lb 4 oz (108.069 kg).  Physical Exam Physical Exam  Constitutional: She appears well-developed and well-nourished.  Eyes: No scleral icterus.  Neck: Neck supple.  Cardiovascular: Normal rate, regular rhythm and normal heart sounds.   Pulmonary/Chest: Effort normal and breath sounds normal. She has no wheezes. She has no rales.  Abdominal: Soft. Bowel sounds are normal. She exhibits no distension. There is no hepatosplenomegaly. There is no tenderness. There is no rebound and no CVA tenderness. A hernia is present. Hernia confirmed positive in the ventral area.    Lymphadenopathy:    She has no cervical adenopathy.    Data Reviewed CT from 2010 reviewed as well as Dr. [name] prior notes  Assessment    Incisional hernia    Plan   This hernia is clearly bigger and more symptomatic right now.  She has a number of cormorbidities that need to be fully elucidated before proceeding with a hernia repair. We discussed laparoscopic and open hernia repair both of which may have some significant risk given her general medical state.  I will obtain preoperative evaluation from cardiology and primary care prior to beginning.  I will also do another ct scan as I think this is considerably bigger now so she and I can discuss how to approach this hernia and what will be involved both perioperatively and postop. I will see her back after this.       Avyon Herendeen 07/19/2011, 3:34 PM

## 2011-07-20 ENCOUNTER — Telehealth: Payer: Self-pay

## 2011-07-20 NOTE — Telephone Encounter (Signed)
[name] needs to be scheduled for an abdominal wall hernia repair, possible laprascopic under general anesthetic.  Dr Emelia Loron, is requesting a cardiac clearance be faxed to him at [phone] if she is cleared for this surgery.  Surgery date has not yet been set pending cardiac clearance.

## 2011-07-21 NOTE — Telephone Encounter (Signed)
She had a negative stress test in 3/12. Ok to proceed. Felt to be low risk for peri-op cardiac complications.

## 2011-07-21 NOTE — Telephone Encounter (Signed)
Thanks Dan

## 2011-07-22 ENCOUNTER — Ambulatory Visit
Admission: RE | Admit: 2011-07-22 | Discharge: 2011-07-22 | Disposition: A | Discharge status: Home / Self Care | Payer: Medicare Other | Source: Ambulatory Visit | Attending: General Surgery | Admitting: General Surgery

## 2011-07-22 DIAGNOSIS — K469 Unspecified abdominal hernia without obstruction or gangrene: Secondary | ICD-10-CM

## 2011-07-22 MED ORDER — IOHEXOL 300 MG/ML  SOLN
125.0000 mL | Freq: Once | INTRAMUSCULAR | Status: AC | PRN
  Administered 2011-07-22: 125 mL via INTRAVENOUS

## 2011-08-04 NOTE — Telephone Encounter (Signed)
I don't remember if i sent this to you already

## 2011-08-09 ENCOUNTER — Ambulatory Visit (INDEPENDENT_AMBULATORY_CARE_PROVIDER_SITE_OTHER): Payer: BC Managed Care – PPO | Admitting: General Surgery

## 2011-08-09 ENCOUNTER — Encounter (INDEPENDENT_AMBULATORY_CARE_PROVIDER_SITE_OTHER): Payer: Self-pay | Admitting: General Surgery

## 2011-08-09 VITALS — BP 138/88 | HR 72 | Temp 98.1°F | Resp 18 | Ht 65.0 in | Wt 232.4 lb

## 2011-08-09 DIAGNOSIS — K432 Incisional hernia without obstruction or gangrene: Secondary | ICD-10-CM

## 2011-08-09 NOTE — Progress Notes (Signed)
Patient ID: [name], female   DOB: 1951-07-20, 60 y.o.   MRN: [id number]  Chief Complaint  Patient presents with  . Routine Post Op    Recheck hernia    HPI [name] is a 60 y.o. female.   HPI 79 yof I saw recently for RUQ incisional hernia.  CT scan has now been done with no significant changes.  I have heard from cardiology and primary care about perioperative recommendations.  Her symptoms have not changed.  She comes in today to discuss findings as well as possible surgery.  Past Medical History  Diagnosis Date  . Arthritis   . Asthma   . Diabetes mellitus   . CHF (congestive heart failure)   . Hypertension   . Hyperlipidemia   . Pericarditis, viral 2010 October  . Depression   . Anxiety   . Bipolar 1 disorder   . Hernia     Past Surgical History  Procedure Date  . Abdominal hysterectomy   . Foot surgery     bone spurs   . Cholecystectomy 1984    open    History reviewed. No pertinent family history.  Social History History  Substance Use Topics  . Smoking status: Never Smoker   . Smokeless tobacco: Never Used  . Alcohol Use: No    Allergies  Allergen Reactions  . Lithium     Shakes and delusional pt started seeing things      Current Outpatient Prescriptions  Medication Sig Dispense Refill  . ALPRAZolam (XANAX XR) 1 MG 24 hr tablet Take 1 mg by mouth as needed.       . ALPRAZolam (XANAX) 0.5 MG tablet Take 0.5 mg by mouth daily.       [name] aspirin 81 MG tablet Take 81 mg by mouth daily.        [name] etodolac (LODINE) 400 MG tablet Take 400 mg by mouth daily.       . furosemide (LASIX) 20 MG tablet Take 20 mg by mouth as needed.       . lamoTRIgine (LAMICTAL) 150 MG tablet Take 150 mg by mouth 2 (two) times daily. 1 and 1/2 tablet BID       . lisinopril (PRINIVIL,ZESTRIL) 20 MG tablet Take 20 mg by mouth daily.        [name] losartan (COZAAR) 100 MG tablet Take 100 mg by mouth daily.        . potassium chloride (KLOR-CON) 20 MEQ packet Take 20 mEq  by mouth 2 (two) times daily.        . QUEtiapine (SEROQUEL XR) 300 MG 24 hr tablet Take 300 mg by mouth at bedtime.        . risperiDONE (RISPERDAL) 2 MG tablet Take 2 mg by mouth daily.       . simvastatin (ZOCOR) 40 MG tablet Take 40 mg by mouth at bedtime.        [name] zolpidem (AMBIEN) 10 MG tablet Take 10 mg by mouth at bedtime as needed.          Review of Systems Review of Systems  Blood pressure 138/88, pulse 72, temperature 98.1 F (36.7 C), temperature source Temporal, resp. rate 18, height 5\' 5"  (1.651 m), weight 232 lb 6.4 oz (105.416 kg).  Physical Exam Physical Exam  Abdominal: Soft.      Data Reviewed CT ABDOMEN AND PELVIS WITH CONTRAST  Technique: Multidetector CT imaging of the abdomen and pelvis was  performed following the  standard protocol during bolus  administration of intravenous contrast.  Contrast: OMNIPAQUE IOHEXOL 300 MG/ML IV SOLN  Comparison: 06/09/2009.  Findings: Lung bases show minimal dependent atelectasis. Heart is  mildly enlarged. No pericardial or pleural effusion. There are  small juxta diaphragmatic lymph nodes.  Liver is unremarkable. Cholecystectomy. Adrenal glands, kidneys,  spleen, pancreas, stomach and small bowel are unremarkable. A fair  amount of stool is seen in the colon.  Hysterectomy. Ovaries are visualized. There is a fat-containing  right lateral ventral abdominal wall hernia (image 45), as on  06/09/2009. No pathologically enlarged lymph nodes. No free  fluid. Retroaortic left renal vein.  IMPRESSION:  1. Right lateral ventral abdominal wall hernia, stable.  2. Constipation.    Assessment    Incisional hernia     Plan    We discussed for about thirty minutes her findings on CT scan.  We discussed laparoscopic possible open hernia repair with mesh.  We went over at length possible risks including, but not limited to, bleeding, infection, recurrence especially given her weight although she has lost over 60  pounds, inability to repair hernia if there is bowel injury, as well as DVT/PE, pna, prolonged ventilation, MI, arrythmia etc.  We discussed hospital [name] as well as convalescence.  I told her this would be months before she was back to baseline.  I am concerned over her expectations and understanding of this.  I have asked her to bring her husband in with her so we can discuss this again with him present. We also discussed losing more weight prior to surgery.        Denni France 08/09/2011, 11:48 AM

## 2011-08-10 ENCOUNTER — Encounter (INDEPENDENT_AMBULATORY_CARE_PROVIDER_SITE_OTHER): Payer: Self-pay | Admitting: General Surgery

## 2011-09-08 ENCOUNTER — Ambulatory Visit (INDEPENDENT_AMBULATORY_CARE_PROVIDER_SITE_OTHER): Payer: BC Managed Care – PPO | Admitting: General Surgery

## 2011-09-08 ENCOUNTER — Encounter (INDEPENDENT_AMBULATORY_CARE_PROVIDER_SITE_OTHER): Payer: Self-pay | Admitting: General Surgery

## 2011-09-08 VITALS — BP 144/82 | HR 82 | Resp 16 | Ht 65.0 in | Wt 241.0 lb

## 2011-09-08 DIAGNOSIS — K432 Incisional hernia without obstruction or gangrene: Secondary | ICD-10-CM | POA: Diagnosis not present

## 2011-09-08 NOTE — Progress Notes (Signed)
Subjective:     Patient ID: [name], female   DOB: [birth year], 61 y.o.   MRN: [id number]  HPI 61 yof I have seen a couple of times for ventral hernia.  She would like this repaired but she has recently been gaining weight.  I also asked her to bring her husband today to discuss what hernia repair would entail.  Review of Systems     Objective:   Physical Exam     Assessment:     Incisional hernia    Plan:     This visit was a 30 minute visit discussing with she and her husband again indications for surgery as well as risks involved. She has been gaining weight and I told her that ideally she should lose more weight.  We discussed laparoscopic, possible open, ventral hernia repair with mesh.  Risks would include, but not limited to, bleeding, infection, mesh infection, recurrence which would be high given her habitus, ileus, bowel injury, dvt, pe, pna, MI, arrythmia etc.  They both understand this. They are going to think about this and I think we should wait until she loses some more weight.  I gave them some warning signs but otherwise will see again in 6 months.

## 2011-09-21 ENCOUNTER — Encounter (INDEPENDENT_AMBULATORY_CARE_PROVIDER_SITE_OTHER): Payer: Self-pay

## 2011-10-24 DIAGNOSIS — F319 Bipolar disorder, unspecified: Secondary | ICD-10-CM | POA: Diagnosis not present

## 2012-01-17 DIAGNOSIS — F319 Bipolar disorder, unspecified: Secondary | ICD-10-CM | POA: Diagnosis not present

## 2012-03-06 DIAGNOSIS — E119 Type 2 diabetes mellitus without complications: Secondary | ICD-10-CM | POA: Diagnosis not present

## 2012-03-06 DIAGNOSIS — R0609 Other forms of dyspnea: Secondary | ICD-10-CM | POA: Diagnosis not present

## 2012-03-06 DIAGNOSIS — R635 Abnormal weight gain: Secondary | ICD-10-CM | POA: Diagnosis not present

## 2012-03-07 DIAGNOSIS — F319 Bipolar disorder, unspecified: Secondary | ICD-10-CM | POA: Diagnosis not present

## 2012-04-04 DIAGNOSIS — F319 Bipolar disorder, unspecified: Secondary | ICD-10-CM | POA: Diagnosis not present

## 2012-04-18 DIAGNOSIS — F319 Bipolar disorder, unspecified: Secondary | ICD-10-CM | POA: Diagnosis not present

## 2012-05-08 DIAGNOSIS — F319 Bipolar disorder, unspecified: Secondary | ICD-10-CM | POA: Diagnosis not present

## 2012-05-24 DIAGNOSIS — F319 Bipolar disorder, unspecified: Secondary | ICD-10-CM | POA: Diagnosis not present

## 2012-06-27 DIAGNOSIS — T148XXA Other injury of unspecified body region, initial encounter: Secondary | ICD-10-CM | POA: Diagnosis not present

## 2012-06-27 DIAGNOSIS — Z23 Encounter for immunization: Secondary | ICD-10-CM | POA: Diagnosis not present

## 2012-07-05 DIAGNOSIS — F319 Bipolar disorder, unspecified: Secondary | ICD-10-CM | POA: Diagnosis not present

## 2012-07-05 DIAGNOSIS — M79609 Pain in unspecified limb: Secondary | ICD-10-CM | POA: Diagnosis not present

## 2012-07-13 DIAGNOSIS — M25569 Pain in unspecified knee: Secondary | ICD-10-CM | POA: Diagnosis not present

## 2012-07-31 DIAGNOSIS — M25569 Pain in unspecified knee: Secondary | ICD-10-CM | POA: Diagnosis not present

## 2012-08-03 DIAGNOSIS — M1710 Unilateral primary osteoarthritis, unspecified knee: Secondary | ICD-10-CM | POA: Diagnosis not present

## 2012-08-03 DIAGNOSIS — M23329 Other meniscus derangements, posterior horn of medial meniscus, unspecified knee: Secondary | ICD-10-CM | POA: Diagnosis not present

## 2012-08-09 ENCOUNTER — Other Ambulatory Visit: Payer: Self-pay | Admitting: Orthopedic Surgery

## 2012-08-09 DIAGNOSIS — R609 Edema, unspecified: Secondary | ICD-10-CM

## 2012-08-09 DIAGNOSIS — M25562 Pain in left knee: Secondary | ICD-10-CM

## 2012-08-10 DIAGNOSIS — M1710 Unilateral primary osteoarthritis, unspecified knee: Secondary | ICD-10-CM | POA: Diagnosis not present

## 2012-08-10 DIAGNOSIS — M23329 Other meniscus derangements, posterior horn of medial meniscus, unspecified knee: Secondary | ICD-10-CM | POA: Diagnosis not present

## 2012-08-18 ENCOUNTER — Ambulatory Visit
Admission: RE | Admit: 2012-08-18 | Discharge: 2012-08-18 | Disposition: A | Discharge status: Home / Self Care | Payer: BC Managed Care – PPO | Source: Ambulatory Visit | Attending: Orthopedic Surgery | Admitting: Orthopedic Surgery

## 2012-08-18 DIAGNOSIS — M25562 Pain in left knee: Secondary | ICD-10-CM

## 2012-08-18 DIAGNOSIS — R609 Edema, unspecified: Secondary | ICD-10-CM

## 2012-08-20 ENCOUNTER — Encounter (INDEPENDENT_AMBULATORY_CARE_PROVIDER_SITE_OTHER): Payer: Self-pay | Admitting: General Surgery

## 2012-08-28 DIAGNOSIS — F319 Bipolar disorder, unspecified: Secondary | ICD-10-CM | POA: Diagnosis not present

## 2012-09-04 ENCOUNTER — Other Ambulatory Visit (HOSPITAL_COMMUNITY): Payer: Self-pay | Admitting: Orthopedic Surgery

## 2012-09-06 ENCOUNTER — Encounter (HOSPITAL_COMMUNITY)
Admission: RE | Admit: 2012-09-06 | Discharge: 2012-09-06 | Disposition: A | Discharge status: Home / Self Care | Payer: BC Managed Care – PPO | Source: Ambulatory Visit | Attending: Orthopedic Surgery | Admitting: Orthopedic Surgery

## 2012-09-06 ENCOUNTER — Encounter (HOSPITAL_COMMUNITY): Payer: Self-pay

## 2012-09-06 ENCOUNTER — Encounter (HOSPITAL_COMMUNITY): Payer: Self-pay | Admitting: Pharmacy Technician

## 2012-09-06 DIAGNOSIS — I517 Cardiomegaly: Secondary | ICD-10-CM | POA: Diagnosis not present

## 2012-09-06 DIAGNOSIS — Z01818 Encounter for other preprocedural examination: Secondary | ICD-10-CM | POA: Diagnosis not present

## 2012-09-06 HISTORY — DX: Sleep apnea, unspecified: G47.30

## 2012-09-06 LAB — CBC
Hemoglobin: 14 g/dL (ref 12.0–15.0)
MCH: 31.3 pg (ref 26.0–34.0)
RBC: 4.47 MIL/uL (ref 3.87–5.11)

## 2012-09-06 LAB — COMPREHENSIVE METABOLIC PANEL
ALT: 31 U/L (ref 0–35)
Alkaline Phosphatase: 116 U/L (ref 39–117)
CO2: 25 mEq/L (ref 19–32)
Calcium: 9.9 mg/dL (ref 8.4–10.5)
GFR calc Af Amer: 68 mL/min — ABNORMAL LOW (ref >90)
GFR calc non Af Amer: 58 mL/min — ABNORMAL LOW (ref >90)
Glucose, Bld: 143 mg/dL — ABNORMAL HIGH (ref 70–99)
Sodium: 139 mEq/L (ref 135–145)
Total Bilirubin: 0.5 mg/dL (ref 0.3–1.2)

## 2012-09-06 MED ORDER — DEXTROSE 5 % IV SOLN
3.0000 g | INTRAVENOUS | Status: AC
  Administered 2012-09-07: 3 g via INTRAVENOUS
  Filled 2012-09-06: qty 3000

## 2012-09-06 NOTE — Pre-Procedure Instructions (Signed)
[name]  09/06/2012   Your procedure is scheduled on:  Friday September 08, 2011  Report to [facility] at 1130 AM.  Call this number if you have problems the morning of surgery: [phone]   Remember:   Do not eat food or drink liquids after midnight.   Take these medicines the morning of surgery with A SIP OF WATER: Xanax XR, and  Levothyroxine   Do not wear jewelry, make-up or nail polish.  Do not wear lotions, powders, or perfumes. You may wear deodorant.  Do not shave 48 hours prior to surgery.   Do not bring valuables to the hospital.  Contacts, dentures or bridgework may not be worn into surgery.  Leave suitcase in the car. After surgery it may be brought to your room.  For patients admitted to the hospital, checkout time is 11:00 AM the day of  discharge.   Patients discharged the day of surgery will not be allowed to drive  home.    Special Instructions: Incentive Spirometry - Practice and bring it with you on the day of surgery. Shower using CHG 2 nights before surgery and the night before surgery.  If you shower the day of surgery use CHG.  Use special wash - you have one bottle of CHG for all showers.  You should use approximately 1/3 of the bottle for each shower.   Please read over the following fact sheets that you were given: Pain Booklet, Coughing and Deep Breathing, Blood Transfusion Information, MRSA Information and Surgical Site Infection Prevention

## 2012-09-06 NOTE — Pre-Procedure Instructions (Signed)
[name]  09/06/2012   Your procedure is scheduled on:  Friday, January 17th  Report to [facility] at 1130 AM.  Call this number if you have problems the morning of surgery: [phone]   Remember:   Do not eat food or drink liquids after midnight.    Take these medicines the morning of surgery with A SIP OF WATER: xanax, risperdal   Do not wear jewelry, make-up or nail polish.  Do not wear lotions, powders, or perfumes. You may not wear deodorant.  Do not shave 48 hours prior to surgery. Men may shave face and neck.  Do not bring valuables to the hospital.  Contacts, dentures or bridgework may not be worn into surgery.  Leave suitcase in the car. After surgery it may be brought to your room.  For patients admitted to the hospital, checkout time is 11:00 AM the day of discharge.   Patients discharged the day of surgery will not be allowed to drive home.   Special Instructions: Shower using CHG 2 nights before surgery and the night before surgery.  If you shower the day of surgery use CHG.  Use special wash - you have one bottle of CHG for all showers.  You should use approximately 1/3 of the bottle for each shower.   Please read over the following fact sheets that you were given: Pain Booklet, Coughing and Deep Breathing, MRSA Information and Surgical Site Infection Prevention

## 2012-09-07 ENCOUNTER — Encounter (HOSPITAL_COMMUNITY): Payer: Self-pay | Admitting: General Practice

## 2012-09-07 ENCOUNTER — Ambulatory Visit (HOSPITAL_COMMUNITY)
Admission: RE | Admit: 2012-09-07 | Discharge: 2012-09-08 | Disposition: A | Discharge status: Home / Self Care | Payer: BC Managed Care – PPO | Source: Ambulatory Visit | Attending: Orthopedic Surgery | Admitting: Orthopedic Surgery

## 2012-09-07 ENCOUNTER — Encounter (HOSPITAL_COMMUNITY)
Admission: RE | Disposition: A | Discharge status: Home / Self Care | Payer: Self-pay | Source: Ambulatory Visit | Attending: Orthopedic Surgery

## 2012-09-07 ENCOUNTER — Encounter (HOSPITAL_COMMUNITY): Payer: Self-pay | Admitting: Anesthesiology

## 2012-09-07 ENCOUNTER — Encounter (HOSPITAL_COMMUNITY): Payer: Self-pay | Admitting: Surgery

## 2012-09-07 ENCOUNTER — Ambulatory Visit (HOSPITAL_COMMUNITY): Payer: BC Managed Care – PPO | Admitting: Anesthesiology

## 2012-09-07 DIAGNOSIS — M23302 Other meniscus derangements, unspecified lateral meniscus, unspecified knee: Secondary | ICD-10-CM | POA: Insufficient documentation

## 2012-09-07 DIAGNOSIS — M23329 Other meniscus derangements, posterior horn of medial meniscus, unspecified knee: Secondary | ICD-10-CM | POA: Diagnosis not present

## 2012-09-07 DIAGNOSIS — I509 Heart failure, unspecified: Secondary | ICD-10-CM | POA: Diagnosis not present

## 2012-09-07 DIAGNOSIS — Z01812 Encounter for preprocedural laboratory examination: Secondary | ICD-10-CM | POA: Insufficient documentation

## 2012-09-07 DIAGNOSIS — M23305 Other meniscus derangements, unspecified medial meniscus, unspecified knee: Secondary | ICD-10-CM | POA: Insufficient documentation

## 2012-09-07 DIAGNOSIS — Z01810 Encounter for preprocedural cardiovascular examination: Secondary | ICD-10-CM | POA: Insufficient documentation

## 2012-09-07 DIAGNOSIS — I10 Essential (primary) hypertension: Secondary | ICD-10-CM | POA: Insufficient documentation

## 2012-09-07 DIAGNOSIS — M1712 Unilateral primary osteoarthritis, left knee: Secondary | ICD-10-CM

## 2012-09-07 DIAGNOSIS — M1710 Unilateral primary osteoarthritis, unspecified knee: Secondary | ICD-10-CM | POA: Diagnosis not present

## 2012-09-07 DIAGNOSIS — M23359 Other meniscus derangements, posterior horn of lateral meniscus, unspecified knee: Secondary | ICD-10-CM | POA: Diagnosis not present

## 2012-09-07 DIAGNOSIS — E119 Type 2 diabetes mellitus without complications: Secondary | ICD-10-CM | POA: Diagnosis not present

## 2012-09-07 DIAGNOSIS — M959 Acquired deformity of musculoskeletal system, unspecified: Secondary | ICD-10-CM | POA: Insufficient documentation

## 2012-09-07 DIAGNOSIS — Z01818 Encounter for other preprocedural examination: Secondary | ICD-10-CM | POA: Insufficient documentation

## 2012-09-07 DIAGNOSIS — I517 Cardiomegaly: Secondary | ICD-10-CM | POA: Insufficient documentation

## 2012-09-07 DIAGNOSIS — G4730 Sleep apnea, unspecified: Secondary | ICD-10-CM | POA: Insufficient documentation

## 2012-09-07 HISTORY — PX: KNEE ARTHROSCOPY: SHX127

## 2012-09-07 LAB — CBC
MCH: 30.4 pg (ref 26.0–34.0)
Platelets: 203 10*3/uL (ref 150–400)
RBC: 4.04 MIL/uL (ref 3.87–5.11)
RDW: 14.3 % (ref 11.5–15.5)
WBC: 6.3 10*3/uL (ref 4.0–10.5)

## 2012-09-07 LAB — GLUCOSE, CAPILLARY
Glucose-Capillary: 173 mg/dL — ABNORMAL HIGH (ref 70–99)
Glucose-Capillary: 162 mg/dL — ABNORMAL HIGH (ref 70–99)

## 2012-09-07 LAB — CREATININE, SERUM
Creatinine, Ser: 0.98 mg/dL (ref 0.50–1.10)
GFR calc Af Amer: 72 mL/min — ABNORMAL LOW (ref >90)

## 2012-09-07 SURGERY — ARTHROSCOPY, KNEE
Anesthesia: General | Site: Knee | Laterality: Left | Wound class: Clean

## 2012-09-07 MED ORDER — CEFAZOLIN SODIUM-DEXTROSE 2-3 GM-% IV SOLR
2.0000 g | Freq: Four times a day (QID) | INTRAVENOUS | Status: AC
  Administered 2012-09-07 – 2012-09-08 (×3): 2 g via INTRAVENOUS
  Filled 2012-09-07 (×3): qty 50

## 2012-09-07 MED ORDER — BUPIVACAINE-EPINEPHRINE PF 0.25-1:200000 % IJ SOLN
INTRAMUSCULAR | Status: AC
  Filled 2012-09-07: qty 30

## 2012-09-07 MED ORDER — ARTIFICIAL TEARS OP OINT
TOPICAL_OINTMENT | OPHTHALMIC | Status: DC | PRN
  Administered 2012-09-07: 1 via OPHTHALMIC

## 2012-09-07 MED ORDER — HYDROMORPHONE HCL PF 1 MG/ML IJ SOLN: 0.2500 mg | INTRAMUSCULAR | Status: DC | PRN

## 2012-09-07 MED ORDER — HYDROMORPHONE HCL PF 1 MG/ML IJ SOLN: 0.5000 mg | INTRAMUSCULAR | Status: DC | PRN

## 2012-09-07 MED ORDER — OXYCODONE-ACETAMINOPHEN 5-325 MG PO TABS: 1.0000 | ORAL_TABLET | ORAL | Status: DC | PRN

## 2012-09-07 MED ORDER — PROPOFOL 10 MG/ML IV BOLUS
INTRAVENOUS | Status: DC | PRN
  Administered 2012-09-07: 250 mg via INTRAVENOUS
  Administered 2012-09-07: 50 mg via INTRAVENOUS

## 2012-09-07 MED ORDER — LACTATED RINGERS IV SOLN
INTRAVENOUS | Status: DC
  Administered 2012-09-07: 13:00:00 via INTRAVENOUS

## 2012-09-07 MED ORDER — HYDROCODONE-ACETAMINOPHEN 5-325 MG PO TABS: 1.0000 | ORAL_TABLET | ORAL | Status: DC | PRN

## 2012-09-07 MED ORDER — LEVOCETIRIZINE DIHYDROCHLORIDE 5 MG PO TABS: 5.0000 mg | ORAL_TABLET | Freq: Every evening | ORAL | Status: DC

## 2012-09-07 MED ORDER — LORATADINE 10 MG PO TABS
10.0000 mg | ORAL_TABLET | Freq: Every day | ORAL | Status: DC
  Administered 2012-09-07: 10 mg via ORAL
  Filled 2012-09-07 (×2): qty 1

## 2012-09-07 MED ORDER — ASPIRIN EC 81 MG PO TBEC
81.0000 mg | DELAYED_RELEASE_TABLET | Freq: Every day | ORAL | Status: DC
  Filled 2012-09-07: qty 1

## 2012-09-07 MED ORDER — ONDANSETRON HCL 4 MG/2ML IJ SOLN: 4.0000 mg | Freq: Four times a day (QID) | INTRAMUSCULAR | Status: DC | PRN

## 2012-09-07 MED ORDER — BUPIVACAINE HCL (PF) 0.5 % IJ SOLN
INTRAMUSCULAR | Status: DC | PRN
  Administered 2012-09-07: 30 mL

## 2012-09-07 MED ORDER — ASPIRIN 81 MG PO TABS: 81.0000 mg | ORAL_TABLET | Freq: Every day | ORAL | Status: DC

## 2012-09-07 MED ORDER — METOCLOPRAMIDE HCL 5 MG/ML IJ SOLN
5.0000 mg | Freq: Three times a day (TID) | INTRAMUSCULAR | Status: DC | PRN
Start: 2012-09-07 — End: 2012-09-07

## 2012-09-07 MED ORDER — LACTATED RINGERS IV SOLN
INTRAVENOUS | Status: DC | PRN
  Administered 2012-09-07: 13:00:00 via INTRAVENOUS

## 2012-09-07 MED ORDER — PAROXETINE HCL 30 MG PO TABS
30.0000 mg | ORAL_TABLET | Freq: Every day | ORAL | Status: DC
  Administered 2012-09-07: 30 mg via ORAL
  Filled 2012-09-07 (×2): qty 1

## 2012-09-07 MED ORDER — LOSARTAN POTASSIUM 50 MG PO TABS
100.0000 mg | ORAL_TABLET | Freq: Every day | ORAL | Status: DC
  Filled 2012-09-07 (×2): qty 2

## 2012-09-07 MED ORDER — SODIUM CHLORIDE 0.9 % IV SOLN
INTRAVENOUS | Status: DC
  Administered 2012-09-07: 15:00:00 via INTRAVENOUS

## 2012-09-07 MED ORDER — METOCLOPRAMIDE HCL 10 MG PO TABS: 5.0000 mg | ORAL_TABLET | Freq: Three times a day (TID) | ORAL | Status: DC | PRN

## 2012-09-07 MED ORDER — ALPRAZOLAM ER 1 MG PO TB24: 1.0000 mg | ORAL_TABLET | Freq: Two times a day (BID) | ORAL | Status: DC

## 2012-09-07 MED ORDER — DEXTROSE 5 % IV SOLN
INTRAVENOUS | Status: DC | PRN
  Administered 2012-09-07: 13:00:00 via INTRAVENOUS

## 2012-09-07 MED ORDER — ETODOLAC 400 MG PO TABS
400.0000 mg | ORAL_TABLET | Freq: Every day | ORAL | Status: DC
  Administered 2012-09-07: 400 mg via ORAL
  Filled 2012-09-07 (×2): qty 1

## 2012-09-07 MED ORDER — LOSARTAN POTASSIUM 50 MG PO TABS: 100.0000 mg | ORAL_TABLET | Freq: Every day | ORAL | Status: DC

## 2012-09-07 MED ORDER — FUROSEMIDE 20 MG PO TABS
20.0000 mg | ORAL_TABLET | Freq: Every day | ORAL | Status: DC
  Filled 2012-09-07 (×2): qty 1

## 2012-09-07 MED ORDER — HYDROCODONE-ACETAMINOPHEN 7.5-325 MG PO TABS: 1.0000 | ORAL_TABLET | ORAL | Status: DC | PRN

## 2012-09-07 MED ORDER — SIMVASTATIN 40 MG PO TABS
40.0000 mg | ORAL_TABLET | Freq: Every day | ORAL | Status: DC
  Administered 2012-09-07: 40 mg via ORAL
  Filled 2012-09-07 (×2): qty 1

## 2012-09-07 MED ORDER — LIDOCAINE HCL (CARDIAC) 20 MG/ML IV SOLN
INTRAVENOUS | Status: DC | PRN
  Administered 2012-09-07: 100 mg via INTRAVENOUS

## 2012-09-07 MED ORDER — SODIUM CHLORIDE 0.9 % IR SOLN
Status: DC | PRN
  Administered 2012-09-07: 6000 mL

## 2012-09-07 MED ORDER — MIDAZOLAM HCL 5 MG/5ML IJ SOLN
INTRAMUSCULAR | Status: DC | PRN
  Administered 2012-09-07: 1.5 mg via INTRAVENOUS

## 2012-09-07 MED ORDER — POTASSIUM CHLORIDE CRYS ER 20 MEQ PO TBCR
20.0000 meq | EXTENDED_RELEASE_TABLET | Freq: Every day | ORAL | Status: DC
  Filled 2012-09-07: qty 1

## 2012-09-07 MED ORDER — PROPOFOL 10 MG/ML IV BOLUS: INTRAVENOUS | Status: DC | PRN

## 2012-09-07 MED ORDER — ONDANSETRON HCL 4 MG/2ML IJ SOLN
INTRAMUSCULAR | Status: DC | PRN
  Administered 2012-09-07: 4 mg via INTRAVENOUS

## 2012-09-07 MED ORDER — ALPRAZOLAM ER 1 MG PO TB24: 1.0000 mg | ORAL_TABLET | Freq: Three times a day (TID) | ORAL | Status: DC | PRN

## 2012-09-07 MED ORDER — ALPRAZOLAM 0.5 MG PO TABS
0.5000 mg | ORAL_TABLET | Freq: Every day | ORAL | Status: DC
  Administered 2012-09-07: 0.5 mg via ORAL
  Filled 2012-09-07: qty 1

## 2012-09-07 MED ORDER — LAMOTRIGINE 25 MG PO TABS
225.0000 mg | ORAL_TABLET | Freq: Two times a day (BID) | ORAL | Status: DC
  Administered 2012-09-07: 225 mg via ORAL
  Filled 2012-09-07 (×3): qty 1

## 2012-09-07 MED ORDER — BUPIVACAINE HCL (PF) 0.5 % IJ SOLN
INTRAMUSCULAR | Status: AC
  Filled 2012-09-07: qty 30

## 2012-09-07 MED ORDER — ONDANSETRON HCL 4 MG PO TABS: 4.0000 mg | ORAL_TABLET | Freq: Four times a day (QID) | ORAL | Status: DC | PRN

## 2012-09-07 MED ORDER — LISINOPRIL 20 MG PO TABS
20.0000 mg | ORAL_TABLET | Freq: Every day | ORAL | Status: DC
  Filled 2012-09-07 (×2): qty 1

## 2012-09-07 MED ORDER — LEVOTHYROXINE SODIUM 75 MCG PO TABS
75.0000 ug | ORAL_TABLET | Freq: Every day | ORAL | Status: DC
  Administered 2012-09-08: 75 ug via ORAL
  Filled 2012-09-07 (×2): qty 1

## 2012-09-07 MED ORDER — ENOXAPARIN SODIUM 40 MG/0.4ML ~~LOC~~ SOLN
40.0000 mg | SUBCUTANEOUS | Status: DC
  Administered 2012-09-08: 40 mg via SUBCUTANEOUS
  Filled 2012-09-07 (×2): qty 0.4

## 2012-09-07 MED ORDER — QUETIAPINE FUMARATE ER 400 MG PO TB24
800.0000 mg | ORAL_TABLET | Freq: Every day | ORAL | Status: DC
  Administered 2012-09-07: 800 mg via ORAL
  Filled 2012-09-07 (×2): qty 2

## 2012-09-07 MED ORDER — ZOLPIDEM TARTRATE 5 MG PO TABS
10.0000 mg | ORAL_TABLET | Freq: Every evening | ORAL | Status: DC | PRN
  Administered 2012-09-08: 10 mg via ORAL
  Filled 2012-09-07: qty 2

## 2012-09-07 SURGICAL SUPPLY — 36 items
BLADE CUDA 5.5 (BLADE) IMPLANT
BLADE GREAT WHITE 4.2 (BLADE) ×1 IMPLANT
BNDG COHESIVE 6X5 TAN STRL LF (GAUZE/BANDAGES/DRESSINGS) ×2 IMPLANT
BUR OVAL 6.0 (BURR) IMPLANT
CLOTH BEACON ORANGE TIMEOUT ST (SAFETY) ×2 IMPLANT
COVER SURGICAL LIGHT HANDLE (MISCELLANEOUS) ×2 IMPLANT
CUFF TOURNIQUET SINGLE 34IN LL (TOURNIQUET CUFF) IMPLANT
CUFF TOURNIQUET SINGLE 44IN (TOURNIQUET CUFF) IMPLANT
DRAPE ARTHROSCOPY W/POUCH 114 (DRAPES) ×2 IMPLANT
DRAPE U-SHAPE 47X51 STRL (DRAPES) ×2 IMPLANT
DRSG EMULSION OIL 3X3 NADH (GAUZE/BANDAGES/DRESSINGS) ×2 IMPLANT
DRSG PAD ABDOMINAL 8X10 ST (GAUZE/BANDAGES/DRESSINGS) ×2 IMPLANT
DURAPREP 26ML APPLICATOR (WOUND CARE) ×2 IMPLANT
GLOVE SURG ORTHO 9.0 STRL STRW (GLOVE) ×2 IMPLANT
GOWN PREVENTION PLUS XLARGE (GOWN DISPOSABLE) ×1 IMPLANT
GOWN SRG XL XLNG 56XLVL 4 (GOWN DISPOSABLE) ×2 IMPLANT
GOWN STRL NON-REIN XL XLG LVL4 (GOWN DISPOSABLE) ×4
KIT BASIN OR (CUSTOM PROCEDURE TRAY) ×2 IMPLANT
KIT ROOM TURNOVER OR (KITS) ×2 IMPLANT
MANIFOLD NEPTUNE II (INSTRUMENTS) ×2 IMPLANT
NDL 18GX1X1/2 (RX/OR ONLY) (NEEDLE) ×1 IMPLANT
NEEDLE 18GX1X1/2 (RX/OR ONLY) (NEEDLE) ×2 IMPLANT
PACK ARTHROSCOPY DSU (CUSTOM PROCEDURE TRAY) ×2 IMPLANT
PAD ARMBOARD 7.5X6 YLW CONV (MISCELLANEOUS) ×4 IMPLANT
PAD CAST 4YDX4 CTTN HI CHSV (CAST SUPPLIES) IMPLANT
PADDING CAST COTTON 4X4 STRL (CAST SUPPLIES) ×2
PADDING CAST COTTON 6X4 STRL (CAST SUPPLIES) ×2 IMPLANT
SET ARTHROSCOPY TUBING (MISCELLANEOUS) ×2
SET ARTHROSCOPY TUBING LN (MISCELLANEOUS) ×1 IMPLANT
SPONGE GAUZE 4X4 12PLY (GAUZE/BANDAGES/DRESSINGS) ×2 IMPLANT
SUT ETHILON 4 0 PS 2 18 (SUTURE) ×2 IMPLANT
SYR 20CC LL (SYRINGE) ×2 IMPLANT
TOWEL OR 17X24 6PK STRL BLUE (TOWEL DISPOSABLE) ×3 IMPLANT
TUBE CONNECTING 12X1/4 (SUCTIONS) ×2 IMPLANT
WAND 90 DEG TURBOVAC W/CORD (SURGICAL WAND) IMPLANT
WATER STERILE IRR 1000ML POUR (IV SOLUTION) ×2 IMPLANT

## 2012-09-07 NOTE — Anesthesia Preprocedure Evaluation (Signed)
Anesthesia Evaluation  Patient identified by MRN, date of birth, ID band Patient awake    Reviewed: Allergy & Precautions, H&P , NPO status , Patient's Chart, lab work & pertinent test results  Airway Mallampati: II      Dental   Pulmonary shortness of breath, asthma , sleep apnea ,  breath sounds clear to auscultation        Cardiovascular hypertension, +CHF Rhythm:Regular Rate:Normal     Neuro/Psych    GI/Hepatic   Endo/Other  diabetes  Renal/GU      Musculoskeletal   Abdominal   Peds  Hematology   Anesthesia Other Findings   Reproductive/Obstetrics                           Anesthesia Physical Anesthesia Plan  ASA: IV  Anesthesia Plan: General   Post-op Pain Management:    Induction: Intravenous  Airway Management Planned: LMA  Additional Equipment:   Intra-op Plan:   Post-operative Plan: Extubation in OR  Informed Consent:   Dental advisory given  Plan Discussed with: CRNA  Anesthesia Plan Comments:         Anesthesia Quick Evaluation

## 2012-09-07 NOTE — Transfer of Care (Signed)
Immediate Anesthesia Transfer of Care Note  Patient: [name]  Procedure(s) Performed: Procedure(s) (LRB) with comments: ARTHROSCOPY KNEE (Left) - Left Knee Arthroscopy  Patient Location: PACU  Anesthesia Type:General  Level of Consciousness: oriented, sedated, patient cooperative and responds to stimulation  Airway & Oxygen Therapy: Patient Spontanous Breathing and Patient connected to face mask oxygen  Post-op Assessment: Report given to PACU RN, Post -op Vital signs reviewed and stable, Patient moving all extremities and Patient moving all extremities X 4  Post vital signs: Reviewed and stable  Complications: No apparent anesthesia complications

## 2012-09-07 NOTE — Anesthesia Procedure Notes (Signed)
Procedure Name: LMA Insertion Date/Time: 09/07/2012 1:39 PM Performed by: Wray Kearns A Pre-anesthesia Checklist: Patient identified, Timeout performed, Emergency Drugs available, Suction available and Patient being monitored Patient Re-evaluated:Patient Re-evaluated prior to inductionOxygen Delivery Method: Circle system utilized Preoxygenation: Pre-oxygenation with 100% oxygen Intubation Type: IV induction Ventilation: Mask ventilation without difficulty LMA: LMA inserted and LMA with gastric port inserted LMA Size: 4.0 Tube type: Oral Number of attempts: 1 Airway Equipment and Method: Patient positioned with wedge pillow Tube secured with: Tape Dental Injury: Teeth and Oropharynx as per pre-operative assessment

## 2012-09-07 NOTE — Progress Notes (Signed)
Plan for discharge to home Saturday morning. Patient kept overnight for her CPAP machine. Prescriptions on the chart for Vicodin. Discharge orders completed.

## 2012-09-07 NOTE — Anesthesia Postprocedure Evaluation (Signed)
  Anesthesia Post-op Note  Patient: [name]  Procedure(s) Performed: Procedure(s) (LRB) with comments: ARTHROSCOPY KNEE (Left) - Left Knee Arthroscopy  Patient Location: PACU  Anesthesia Type:General  Level of Consciousness: awake  Airway and Oxygen Therapy: Patient Spontanous Breathing  Post-op Pain: mild  Post-op Assessment: Post-op Vital signs reviewed  Post-op Vital Signs: Reviewed  Complications: No apparent anesthesia complications

## 2012-09-07 NOTE — H&P (Signed)
[name] is an 62 y.o. female.   Chief Complaint: Osteomyelitis left knee with meniscal pathology mechanical symptoms. HPI: Patient is a 62 year old woman who presents complaining of mechanical catching locking giving way of the left knee. She has failed conservative treatment and presents at this time for arthroscopic debridement.  Past Medical History  Diagnosis Date  . Arthritis   . Asthma   . Diabetes mellitus   . CHF (congestive heart failure)   . Hypertension   . Hyperlipidemia   . Pericarditis, viral 2010 October  . Depression   . Anxiety   . Bipolar 1 disorder   . Hernia   . Sleep apnea     Past Surgical History  Procedure Date  . Abdominal hysterectomy   . Foot surgery     bone spurs   . Cholecystectomy 1984    open  . Appendectomy     No family history on file. Social History:  reports that she has never smoked. She has never used smokeless tobacco. She reports that she does not drink alcohol or use illicit drugs.  Allergies:  Allergies  Allergen Reactions  . Celexa (Citalopram Hydrobromide) Nausea Only  . Lithium     Shakes and delusional pt started seeing things    . Maxzide (Triamterene-Hctz)     Hurt patients kidneys  . Reglan (Metoclopramide) Nausea Only  . Risperdal (Risperidone) Nausea Only    Also causes hallucinations  . Trazodone And Nefazodone Nausea Only  . Zestril (Lisinopril) Nausea Only    No prescriptions prior to admission    Results for orders placed during the hospital encounter of 09/06/12 (from the past 48 hour(s))  SURGICAL PCR SCREEN     Status: Normal   Collection Time   09/06/12  1:58 PM      Component Value Range Comment   MRSA, PCR NEGATIVE  NEGATIVE    Staphylococcus aureus NEGATIVE  NEGATIVE   APTT     Status: Normal   Collection Time   09/06/12  2:00 PM      Component Value Range Comment   aPTT 32  24 - 37 seconds   CBC     Status: Normal   Collection Time   09/06/12  2:00 PM      Component Value Range  Comment   WBC 9.4  4.0 - 10.5 K/uL    RBC 4.47  3.87 - 5.11 MIL/uL    Hemoglobin 14.0  12.0 - 15.0 g/dL    HCT 09.8  11.9 - 14.7 %    MCV 91.1  78.0 - 100.0 fL    MCH 31.3  26.0 - 34.0 pg    MCHC 34.4  30.0 - 36.0 g/dL    RDW 82.9  56.2 - 13.0 %    Platelets 252  150 - 400 K/uL   COMPREHENSIVE METABOLIC PANEL     Status: Abnormal   Collection Time   09/06/12  2:00 PM      Component Value Range Comment   Sodium 139  135 - 145 mEq/L    Potassium 4.6  3.5 - 5.1 mEq/L    Chloride 100  96 - 112 mEq/L    CO2 25  19 - 32 mEq/L    Glucose, Bld 143 (*) 70 - 99 mg/dL    BUN 27 (*) 6 - 23 mg/dL    Creatinine, Ser 8.65  0.50 - 1.10 mg/dL    Calcium 9.9  8.4 - 78.4 mg/dL    Total  Protein 7.6  6.0 - 8.3 g/dL    Albumin 3.7  3.5 - 5.2 g/dL    AST 33  0 - 37 U/L    ALT 31  0 - 35 U/L    Alkaline Phosphatase 116  39 - 117 U/L    Total Bilirubin 0.5  0.3 - 1.2 mg/dL    GFR calc non Af Amer 58 (*) >90 mL/min    GFR calc Af Amer 68 (*) >90 mL/min   PROTIME-INR     Status: Normal   Collection Time   09/06/12  2:00 PM      Component Value Range Comment   Prothrombin Time 13.2  11.6 - 15.2 seconds    INR 1.01  0.00 - 1.49   TYPE AND SCREEN     Status: Normal   Collection Time   09/06/12  2:20 PM      Component Value Range Comment   ABO/RH(D) O POS      Antibody Screen NEG      Sample Expiration 09/20/2012      Dg Chest 2 View  09/06/2012  *RADIOLOGY REPORT*  Clinical Data: Preop for left knee surgery.  Sleep apnea.  Right abdominal hernia.  Nonsmoker.  CHEST - 2 VIEW  Comparison: 02/14/2009 CT.  02/13/2009 plain film.  Findings: Motion degraded lateral view.  Also degraded by patient arm position.  The patient is mildly rotated left on the frontal view.  Mild cardiomegaly, accentuated by low lung volumes on the frontal. No pleural effusion or pneumothorax.  No congestive failure.  IMPRESSION: Cardiomegaly and low lung volumes. No acute findings.   Original Report Authenticated By: Jeronimo Greaves,  M.D.     Review of Systems  All other systems reviewed and are negative.    There were no vitals taken for this visit. Physical Exam  On examination patient has tenderness to palpation medial and lateral joint lines as well as patellofemoral joint. Flexion and internal and external rotation is painful. Assessment/Plan Assessment: Internal derangement left knee with mechanical symptoms.  Plan: Will plan for arthroscopic intervention for debridement of the left knee. Risks and benefits were discussed including infection neurovascular injury persistent pain risk of DVT potential for additional surgery. Patient states she understands and wished to proceed at this time.  [name] V 09/07/2012, 6:31 AM

## 2012-09-07 NOTE — Preoperative (Signed)
Beta Blockers   Reason not to administer Beta Blockers:Not Applicable 

## 2012-09-07 NOTE — Op Note (Signed)
OPERATIVE REPORT  DATE OF SURGERY: 09/07/2012  PATIENT:  [name],  62 y.o. female  PRE-OPERATIVE DIAGNOSIS:  Medial Meniscal Tear Left Knee  POST-OPERATIVE DIAGNOSIS:  Medial Meniscal Tear Left Knee Medial and lateral meniscal tear left knee. Osteochondral defect medial femoral condyle patella and trochlea.  PROCEDURE:  Procedure(s): ARTHROSCOPY KNEE Partial medial and lateral meniscectomy. Abrasion chondroplasty medial femoral condyle patella and trochlea.  SURGEON:  Surgeon(s): Nadara Mustard, MD  ANESTHESIA:   general  EBL:  Minimal ML  SPECIMEN:  No Specimen  TOURNIQUET:  * No tourniquets in log *  PROCEDURE DETAILS: Patient is a 62 year old woman with arthritic pain with mechanical symptoms of her left knee. She has failed conservative care has pain with activities of daily living including catching locking and giving way and presents at this time for arthroscopic intervention. Risks and benefits were discussed including infection neurovascular injury persistent pain need for additional surgery. Patient states she understands was pursued this time. Description of procedure patient was brought to the operating room and underwent a general anesthetic. After adequate levels of anesthesia were obtained patient's left lower extremity was prepped using DuraPrep and draped into a sterile field. The scope was inserted to the anterior lateral portal anterior medial working portal was established. Visualization showed degenerative tearing of the medial and lateral meniscal tears. These were debrided with the shaver. Patient had an osteotome defect with delamination of the cartilage off the medial femoral condyle this was also debrided back with a shaver. Examination of patellofemoral joint show there to be a medial plica which was excised she also had osteochondral defect of the trochlea and patella and this was also debrided with a shaver. The isthmus removed the portals were closed  using 2-0 nylon the joint was infused with a total of 25 cc of half percent Marcaine plain. The wound was covered with Adaptic orthopedic sponges AB dressing Kerlix and Coban. Patient was extubated taken to the PACU in stable condition.  PLAN OF CARE: Admit for overnight observation  PATIENT DISPOSITION:  PACU - hemodynamically stable.   Nadara Mustard, MD 09/07/2012 3:34 PM

## 2012-09-08 LAB — GLUCOSE, CAPILLARY

## 2012-09-08 NOTE — Progress Notes (Signed)
Pt stable Pain controlled Dressing ok Dc today

## 2012-09-09 NOTE — Discharge Summary (Signed)
Physician Discharge Summary  Patient ID: ALISYN LEQUIRE MRN: [id number] DOB/AGE: 22-Jan-1951 62 y.o.  Admit date: 09/07/2012 Discharge date: 09/09/2012  Admission Diagnoses: Osteoarthritis knee with meniscal tear.  Discharge Diagnoses: Osteoarthritis knee with meniscal tear. Active Problems:  * No active hospital problems. *    Discharged Condition: stable  Hospital Course: Patient's hospital course was essentially remarkable she was admitted placed on CPAP machine for sleep apnea. Postoperatively she was stable and discharged to home in stable condition.  Consults: None  Significant Diagnostic Studies: labs: Routine labs  Treatments: surgery: See operative note  Discharge Exam: Blood pressure 120/68, pulse 79, temperature 98.1 F (36.7 C), temperature source Oral, resp. rate 16, height 5\' 5"  (1.651 m), weight 145.151 kg (320 lb), SpO2 95.00%. Incision/Wound: dressing clean dry and intact  Disposition: 01-Home or Self Care  Discharge Orders    Future Orders Please Complete By Expires   Diet - low sodium heart healthy      Diet - low sodium heart healthy      Call MD / Call 911      Comments:   If you experience chest pain or shortness of breath, CALL 911 and be transported to the hospital emergency room.  If you develope a fever above 101 F, pus (white drainage) or increased drainage or redness at the wound, or calf pain, call your surgeon's office.   Constipation Prevention      Comments:   Drink plenty of fluids.  Prune juice may be helpful.  You may use a stool softener, such as Colace (over the counter) 100 mg twice a day.  Use MiraLax (over the counter) for constipation as needed.   Increase activity slowly as tolerated      Call MD / Call 911      Comments:   If you experience chest pain or shortness of breath, CALL 911 and be transported to the hospital emergency room.  If you develope a fever above 101 F, pus (white drainage) or increased drainage or redness at the  wound, or calf pain, call your surgeon's office.   Constipation Prevention      Comments:   Drink plenty of fluids.  Prune juice may be helpful.  You may use a stool softener, such as Colace (over the counter) 100 mg twice a day.  Use MiraLax (over the counter) for constipation as needed.   Increase activity slowly as tolerated          Medication List     As of 09/09/2012 12:27 PM    TAKE these medications         ALPRAZolam 0.5 MG tablet   Commonly known as: XANAX   Take 0.5 mg by mouth daily.      ALPRAZolam 1 MG 24 hr tablet   Commonly known as: XANAX XR   Take 1 mg by mouth 2 (two) times daily.      aspirin 81 MG tablet   Take 81 mg by mouth daily.      etodolac 400 MG tablet   Commonly known as: LODINE   Take 400 mg by mouth daily.      furosemide 20 MG tablet   Commonly known as: LASIX   Take 20 mg by mouth daily.      HYDROcodone-acetaminophen 7.5-325 MG per tablet   Commonly known as: NORCO   Take 1 tablet by mouth every 4 (four) hours as needed for pain.      lamoTRIgine 150 MG tablet  Commonly known as: LAMICTAL   Take 225 mg by mouth 2 (two) times daily. 1 and 1/2 tablet BID      levocetirizine 5 MG tablet   Commonly known as: XYZAL   Take 5 mg by mouth every evening.      levothyroxine 75 MCG tablet   Commonly known as: SYNTHROID, LEVOTHROID   Take 75 mcg by mouth daily.      lisinopril 20 MG tablet   Commonly known as: PRINIVIL,ZESTRIL   Take 20 mg by mouth daily.      losartan 100 MG tablet   Commonly known as: COZAAR   Take 100 mg by mouth daily.      PARoxetine 30 MG tablet   Commonly known as: PAXIL   Take 30 mg by mouth daily.      potassium chloride SA 20 MEQ tablet   Commonly known as: K-DUR,KLOR-CON   Take 20 mEq by mouth daily.      QUEtiapine 400 MG 24 hr tablet   Commonly known as: SEROQUEL XR   Take 800 mg by mouth at bedtime.      simvastatin 40 MG tablet   Commonly known as: ZOCOR   Take 40 mg by mouth at bedtime.        zolpidem 10 MG tablet   Commonly known as: AMBIEN   Take 10 mg by mouth at bedtime as needed.           Follow-up Information    Follow up with Aliea Bobe V, MD. In 1 week.   Contact information:   [address] [place] [address] [phone]          Signed: Nadara Mustard 09/09/2012, 12:27 PM

## 2012-09-10 ENCOUNTER — Encounter (HOSPITAL_COMMUNITY): Payer: Self-pay | Admitting: Orthopedic Surgery

## 2012-10-08 DIAGNOSIS — F319 Bipolar disorder, unspecified: Secondary | ICD-10-CM | POA: Diagnosis not present

## 2012-10-10 DIAGNOSIS — E119 Type 2 diabetes mellitus without complications: Secondary | ICD-10-CM | POA: Diagnosis not present

## 2012-10-31 DIAGNOSIS — M79609 Pain in unspecified limb: Secondary | ICD-10-CM | POA: Diagnosis not present

## 2012-11-15 ENCOUNTER — Emergency Department (HOSPITAL_COMMUNITY): Payer: BC Managed Care – PPO

## 2012-11-15 ENCOUNTER — Observation Stay (HOSPITAL_COMMUNITY): Payer: BC Managed Care – PPO

## 2012-11-15 ENCOUNTER — Encounter (HOSPITAL_COMMUNITY): Payer: Self-pay | Admitting: Emergency Medicine

## 2012-11-15 ENCOUNTER — Observation Stay (HOSPITAL_COMMUNITY)
Admission: EM | Admit: 2012-11-15 | Discharge: 2012-11-16 | Disposition: A | Discharge status: Home / Self Care | Payer: BC Managed Care – PPO | Attending: Internal Medicine | Admitting: Internal Medicine

## 2012-11-15 DIAGNOSIS — K7689 Other specified diseases of liver: Secondary | ICD-10-CM | POA: Insufficient documentation

## 2012-11-15 DIAGNOSIS — F319 Bipolar disorder, unspecified: Secondary | ICD-10-CM | POA: Insufficient documentation

## 2012-11-15 DIAGNOSIS — G4733 Obstructive sleep apnea (adult) (pediatric): Secondary | ICD-10-CM | POA: Insufficient documentation

## 2012-11-15 DIAGNOSIS — R1013 Epigastric pain: Secondary | ICD-10-CM | POA: Insufficient documentation

## 2012-11-15 DIAGNOSIS — R079 Chest pain, unspecified: Secondary | ICD-10-CM

## 2012-11-15 DIAGNOSIS — I509 Heart failure, unspecified: Secondary | ICD-10-CM | POA: Insufficient documentation

## 2012-11-15 DIAGNOSIS — R197 Diarrhea, unspecified: Secondary | ICD-10-CM | POA: Insufficient documentation

## 2012-11-15 DIAGNOSIS — I4891 Unspecified atrial fibrillation: Secondary | ICD-10-CM

## 2012-11-15 DIAGNOSIS — K432 Incisional hernia without obstruction or gangrene: Secondary | ICD-10-CM

## 2012-11-15 DIAGNOSIS — E119 Type 2 diabetes mellitus without complications: Secondary | ICD-10-CM | POA: Insufficient documentation

## 2012-11-15 DIAGNOSIS — R1115 Cyclical vomiting syndrome unrelated to migraine: Secondary | ICD-10-CM | POA: Diagnosis not present

## 2012-11-15 DIAGNOSIS — R112 Nausea with vomiting, unspecified: Secondary | ICD-10-CM | POA: Insufficient documentation

## 2012-11-15 DIAGNOSIS — E785 Hyperlipidemia, unspecified: Secondary | ICD-10-CM

## 2012-11-15 DIAGNOSIS — K5730 Diverticulosis of large intestine without perforation or abscess without bleeding: Secondary | ICD-10-CM | POA: Insufficient documentation

## 2012-11-15 DIAGNOSIS — R0602 Shortness of breath: Principal | ICD-10-CM | POA: Insufficient documentation

## 2012-11-15 DIAGNOSIS — Z9989 Dependence on other enabling machines and devices: Secondary | ICD-10-CM | POA: Diagnosis present

## 2012-11-15 DIAGNOSIS — E669 Obesity, unspecified: Secondary | ICD-10-CM | POA: Insufficient documentation

## 2012-11-15 DIAGNOSIS — I10 Essential (primary) hypertension: Secondary | ICD-10-CM | POA: Insufficient documentation

## 2012-11-15 DIAGNOSIS — E782 Mixed hyperlipidemia: Secondary | ICD-10-CM | POA: Diagnosis present

## 2012-11-15 DIAGNOSIS — F3130 Bipolar disorder, current episode depressed, mild or moderate severity, unspecified: Secondary | ICD-10-CM

## 2012-11-15 DIAGNOSIS — I517 Cardiomegaly: Secondary | ICD-10-CM | POA: Insufficient documentation

## 2012-11-15 HISTORY — DX: Adverse effect of unspecified anesthetic, initial encounter: T41.45XA

## 2012-11-15 HISTORY — DX: Other complications of anesthesia, initial encounter: T88.59XA

## 2012-11-15 LAB — CBC
HCT: 38.3 % (ref 36.0–46.0)
MCHC: 34.5 g/dL (ref 30.0–36.0)
MCV: 87.2 fL (ref 78.0–100.0)
RDW: 14.1 % (ref 11.5–15.5)

## 2012-11-15 LAB — POCT I-STAT, CHEM 8
Calcium, Ion: 1.15 mmol/L (ref 1.13–1.30)
Glucose, Bld: 179 mg/dL — ABNORMAL HIGH (ref 70–99)
HCT: 40 % (ref 36.0–46.0)
Hemoglobin: 13.6 g/dL (ref 12.0–15.0)
TCO2: 28 mmol/L (ref 0–100)

## 2012-11-15 LAB — GLUCOSE, CAPILLARY
Glucose-Capillary: 154 mg/dL — ABNORMAL HIGH (ref 70–99)
Glucose-Capillary: 110 mg/dL — ABNORMAL HIGH (ref 70–99)

## 2012-11-15 LAB — HEMOGLOBIN A1C: Hgb A1c MFr Bld: 7 % — ABNORMAL HIGH (ref <5.7)

## 2012-11-15 MED ORDER — LEVOTHYROXINE SODIUM 75 MCG PO TABS
75.0000 ug | ORAL_TABLET | Freq: Every day | ORAL | Status: DC
  Administered 2012-11-16: 75 ug via ORAL
  Filled 2012-11-15 (×2): qty 1

## 2012-11-15 MED ORDER — ONDANSETRON HCL 4 MG PO TABS: 4.0000 mg | ORAL_TABLET | Freq: Four times a day (QID) | ORAL | Status: DC | PRN

## 2012-11-15 MED ORDER — SODIUM CHLORIDE 0.9 % IV SOLN
INTRAVENOUS | Status: DC
  Administered 2012-11-15 (×2): via INTRAVENOUS

## 2012-11-15 MED ORDER — ALPRAZOLAM 0.5 MG PO TABS
0.5000 mg | ORAL_TABLET | Freq: Three times a day (TID) | ORAL | Status: DC | PRN
  Filled 2012-11-15: qty 2

## 2012-11-15 MED ORDER — ACETAMINOPHEN 325 MG PO TABS
650.0000 mg | ORAL_TABLET | Freq: Four times a day (QID) | ORAL | Status: DC | PRN
  Administered 2012-11-16: 650 mg via ORAL
  Filled 2012-11-15: qty 2

## 2012-11-15 MED ORDER — LORAZEPAM 1 MG PO TABS
1.0000 mg | ORAL_TABLET | Freq: Once | ORAL | Status: AC
  Administered 2012-11-15: 1 mg via ORAL
  Filled 2012-11-15: qty 1

## 2012-11-15 MED ORDER — HYDROMORPHONE HCL PF 1 MG/ML IJ SOLN: 1.0000 mg | INTRAMUSCULAR | Status: DC | PRN

## 2012-11-15 MED ORDER — ASPIRIN 81 MG PO TABS: 81.0000 mg | ORAL_TABLET | Freq: Every day | ORAL | Status: DC

## 2012-11-15 MED ORDER — ZOLPIDEM TARTRATE 5 MG PO TABS: 5.0000 mg | ORAL_TABLET | Freq: Every evening | ORAL | Status: DC | PRN

## 2012-11-15 MED ORDER — LAMOTRIGINE 25 MG PO TABS
225.0000 mg | ORAL_TABLET | Freq: Two times a day (BID) | ORAL | Status: DC
  Administered 2012-11-15 – 2012-11-16 (×3): 225 mg via ORAL
  Filled 2012-11-15 (×4): qty 1

## 2012-11-15 MED ORDER — ONDANSETRON HCL 4 MG/2ML IJ SOLN
4.0000 mg | Freq: Once | INTRAMUSCULAR | Status: AC
  Administered 2012-11-15: 4 mg via INTRAVENOUS
  Filled 2012-11-15: qty 2

## 2012-11-15 MED ORDER — LEVOCETIRIZINE DIHYDROCHLORIDE 5 MG PO TABS: 5.0000 mg | ORAL_TABLET | Freq: Every evening | ORAL | Status: DC

## 2012-11-15 MED ORDER — LOSARTAN POTASSIUM 50 MG PO TABS
100.0000 mg | ORAL_TABLET | Freq: Every day | ORAL | Status: DC
  Administered 2012-11-15 – 2012-11-16 (×2): 100 mg via ORAL
  Filled 2012-11-15 (×2): qty 2

## 2012-11-15 MED ORDER — ACETAMINOPHEN 650 MG RE SUPP: 650.0000 mg | Freq: Four times a day (QID) | RECTAL | Status: DC | PRN

## 2012-11-15 MED ORDER — IOHEXOL 300 MG/ML  SOLN
100.0000 mL | Freq: Once | INTRAMUSCULAR | Status: AC | PRN
  Administered 2012-11-15: 100 mL via INTRAVENOUS

## 2012-11-15 MED ORDER — SODIUM CHLORIDE 0.9 % IV BOLUS (SEPSIS)
1000.0000 mL | Freq: Once | INTRAVENOUS | Status: AC
  Administered 2012-11-15: 1000 mL via INTRAVENOUS

## 2012-11-15 MED ORDER — ALUM & MAG HYDROXIDE-SIMETH 200-200-20 MG/5ML PO SUSP: 30.0000 mL | Freq: Four times a day (QID) | ORAL | Status: DC | PRN

## 2012-11-15 MED ORDER — LOSARTAN POTASSIUM 50 MG PO TABS: 100.0000 mg | ORAL_TABLET | Freq: Every day | ORAL | Status: DC

## 2012-11-15 MED ORDER — QUETIAPINE FUMARATE 400 MG PO TABS
400.0000 mg | ORAL_TABLET | Freq: Two times a day (BID) | ORAL | Status: DC
  Administered 2012-11-15 – 2012-11-16 (×3): 400 mg via ORAL
  Filled 2012-11-15 (×4): qty 1

## 2012-11-15 MED ORDER — ALPRAZOLAM ER 1 MG PO TB24: 1.0000 mg | ORAL_TABLET | Freq: Two times a day (BID) | ORAL | Status: DC

## 2012-11-15 MED ORDER — LORATADINE 10 MG PO TABS
10.0000 mg | ORAL_TABLET | Freq: Every day | ORAL | Status: DC
  Administered 2012-11-15 – 2012-11-16 (×2): 10 mg via ORAL
  Filled 2012-11-15 (×2): qty 1

## 2012-11-15 MED ORDER — INSULIN ASPART 100 UNIT/ML ~~LOC~~ SOLN: 0.0000 [IU] | Freq: Every day | SUBCUTANEOUS | Status: DC

## 2012-11-15 MED ORDER — PROMETHAZINE HCL 25 MG/ML IJ SOLN
25.0000 mg | Freq: Once | INTRAMUSCULAR | Status: AC
  Administered 2012-11-15: 25 mg via INTRAVENOUS
  Filled 2012-11-15: qty 1

## 2012-11-15 MED ORDER — INSULIN ASPART 100 UNIT/ML ~~LOC~~ SOLN
0.0000 [IU] | Freq: Three times a day (TID) | SUBCUTANEOUS | Status: DC
  Administered 2012-11-15: 2 [IU] via SUBCUTANEOUS
  Administered 2012-11-16 (×2): 1 [IU] via SUBCUTANEOUS

## 2012-11-15 MED ORDER — ALPRAZOLAM 0.5 MG PO TABS
1.0000 mg | ORAL_TABLET | Freq: Two times a day (BID) | ORAL | Status: DC
  Administered 2012-11-15 – 2012-11-16 (×3): 1 mg via ORAL
  Filled 2012-11-15 (×2): qty 2

## 2012-11-15 MED ORDER — ONDANSETRON 4 MG PO TBDP
4.0000 mg | ORAL_TABLET | Freq: Once | ORAL | Status: AC
  Administered 2012-11-15: 4 mg via ORAL
  Filled 2012-11-15: qty 1

## 2012-11-15 MED ORDER — SIMVASTATIN 40 MG PO TABS
40.0000 mg | ORAL_TABLET | Freq: Every day | ORAL | Status: DC
  Administered 2012-11-15: 40 mg via ORAL
  Filled 2012-11-15 (×2): qty 1

## 2012-11-15 MED ORDER — ONDANSETRON HCL 4 MG/2ML IJ SOLN: 4.0000 mg | Freq: Four times a day (QID) | INTRAMUSCULAR | Status: DC | PRN

## 2012-11-15 MED ORDER — HYDROCODONE-ACETAMINOPHEN 5-325 MG PO TABS: 1.0000 | ORAL_TABLET | ORAL | Status: DC | PRN

## 2012-11-15 NOTE — ED Notes (Signed)
Patient transported to CT 

## 2012-11-15 NOTE — Progress Notes (Signed)
UR completed 

## 2012-11-15 NOTE — ED Notes (Signed)
Pt c/o n/v for last 2 days. Wooried she has not getting Medication due to emesis

## 2012-11-15 NOTE — ED Notes (Signed)
MD at bedside. 

## 2012-11-15 NOTE — ED Provider Notes (Signed)
History     CSN: [id number]  Arrival date & time 11/15/12  0507   First MD Initiated Contact with Patient 11/15/12 [phone]      Chief Complaint  Patient presents with  . Emesis  . Nausea    (Consider location/radiation/quality/duration/timing/severity/associated sxs/prior treatment) HPI History provided by patient. Nausea vomiting diarrhea last 2 days. She thought she was feeling better last night and this morning unable to hold anything down. no bloody or bilious emesis. No diarrhea today. No recent antibiotics. Patient is very anxious and worried about her home medications she is unable to hold down. Mild epigastric pain. No recent travel. No known sick contacts. Symptoms moderate in severity. No known alleviating factors.  New Prescriptions   No medications on file    Past Medical History  Diagnosis Date  . Arthritis   . Asthma   . Diabetes mellitus   . CHF (congestive heart failure)   . Hypertension   . Hyperlipidemia   . Pericarditis, viral 2010 October  . Depression   . Anxiety   . Bipolar 1 disorder   . Hernia   . Sleep apnea     Past Surgical History  Procedure Laterality Date  . Abdominal hysterectomy    . Foot surgery      bone spurs   . Cholecystectomy  1984    open  . Appendectomy    . Knee arthroscopy  09/07/2012    Procedure: ARTHROSCOPY KNEE;  Surgeon: Nadara Mustard, MD;  Location: [facility] OR;  Service: Orthopedics;  Laterality: Left;  Left Knee Arthroscopy    History reviewed. No pertinent family history.  History  Substance Use Topics  . Smoking status: Never Smoker   . Smokeless tobacco: Never Used  . Alcohol Use: No    OB History   Grav Para Term Preterm Abortions TAB SAB Ect Mult Living                  Review of Systems  Constitutional: Negative for fever and chills.  HENT: Negative for neck pain and neck stiffness.   Eyes: Negative for visual disturbance.  Respiratory: Negative for shortness of breath.   Cardiovascular: Negative for  chest pain.  Gastrointestinal: Positive for nausea, vomiting and diarrhea. Negative for abdominal distention.  Genitourinary: Negative for dysuria.  Musculoskeletal: Negative for back pain.  Skin: Negative for rash.  Neurological: Negative for headaches.  All other systems reviewed and are negative.    Allergies  Celexa; Lithium; Maxzide; Reglan; Risperdal; Trazodone and nefazodone; and Zestril  Home Medications   Current Outpatient Rx  Name  Route  Sig  Dispense  Refill  . ALPRAZolam (XANAX XR) 1 MG 24 hr tablet   Oral   Take 1 mg by mouth 2 (two) times daily.         [name] ALPRAZolam (XANAX) 0.5 MG tablet   Oral   Take 0.5 mg by mouth daily.          [name] aspirin 81 MG tablet   Oral   Take 81 mg by mouth daily.           [name] etodolac (LODINE) 400 MG tablet   Oral   Take 400 mg by mouth daily.          . furosemide (LASIX) 20 MG tablet   Oral   Take 20 mg by mouth daily.          [name] HYDROcodone-acetaminophen (NORCO) 7.5-325 MG per tablet   Oral  Take 1 tablet by mouth every 4 (four) hours as needed for pain.   60 tablet   0   . lamoTRIgine (LAMICTAL) 150 MG tablet   Oral   Take 225 mg by mouth 2 (two) times daily. 1 and 1/2 tablet BID         . levocetirizine (XYZAL) 5 MG tablet   Oral   Take 5 mg by mouth every evening.         [name] levothyroxine (SYNTHROID, LEVOTHROID) 75 MCG tablet   Oral   Take 75 mcg by mouth daily.         [name] lisinopril (PRINIVIL,ZESTRIL) 20 MG tablet   Oral   Take 20 mg by mouth daily.           [name] losartan (COZAAR) 100 MG tablet   Oral   Take 100 mg by mouth daily.           [name] PARoxetine (PAXIL) 30 MG tablet   Oral   Take 30 mg by mouth daily.         . potassium chloride SA (K-DUR,KLOR-CON) 20 MEQ tablet   Oral   Take 20 mEq by mouth daily.         . QUEtiapine (SEROQUEL XR) 400 MG 24 hr tablet   Oral   Take 800 mg by mouth at bedtime.         . simvastatin (ZOCOR) 40 MG tablet   Oral   Take 40 mg by  mouth at bedtime.           [name] zolpidem (AMBIEN) 10 MG tablet   Oral   Take 10 mg by mouth at bedtime as needed.             BP 146/128  Pulse 84  Temp(Src) 98.1 F (36.7 C) (Oral)  Resp 14  SpO2 98%  Physical Exam  Constitutional: She is oriented to person, place, and time. She appears well-developed and well-nourished.  HENT:  Head: Normocephalic and atraumatic.  Dry mucous membranes  Eyes: EOM are normal. Pupils are equal, round, and reactive to light. No scleral icterus.  Neck: Neck supple.  Cardiovascular: Normal rate, regular rhythm and intact distal pulses.   Pulmonary/Chest: Effort normal and breath sounds normal. No respiratory distress.  Abdominal: Soft. Bowel sounds are normal. She exhibits no distension. There is no tenderness. There is no rebound and no guarding.  Musculoskeletal: Normal range of motion. She exhibits no edema and no tenderness.  Neurological: She is alert and oriented to person, place, and time.  Skin: Skin is warm and dry.    ED Course  Procedures (including critical care time)  Results for orders placed during the hospital encounter of 11/15/12  CBC      Result Value Range   WBC 8.7  4.0 - 10.5 K/uL   RBC 4.39  3.87 - 5.11 MIL/uL   Hemoglobin 13.2  12.0 - 15.0 g/dL   HCT 16.1  09.6 - 04.5 %   MCV 87.2  78.0 - 100.0 fL   MCH 30.1  26.0 - 34.0 pg   MCHC 34.5  30.0 - 36.0 g/dL   RDW 40.9  81.1 - 91.4 %   Platelets 252  150 - 400 K/uL  POCT I-STAT, CHEM 8      Result Value Range   Sodium 142  135 - 145 mEq/L   Potassium 3.7  3.5 - 5.1 mEq/L   Chloride 105  96 - 112 mEq/L  BUN 17  6 - 23 mg/dL   Creatinine, Ser 1.61  0.50 - 1.10 mg/dL   Glucose, Bld 096 (*) 70 - 99 mg/dL   Calcium, Ion 0.45  4.09 - 1.30 mmol/L   TCO2 28  0 - 100 mmol/L   Hemoglobin 13.6  12.0 - 15.0 g/dL   HCT 81.1  91.4 - 78.2 %  POCT I-STAT TROPONIN I      Result Value Range   Troponin i, poc 0.00  0.00 - 0.08 ng/mL   Comment 3            Dg Chest Portable  1 View  11/15/2012  *RADIOLOGY REPORT*  Clinical Data: Short of breath.  PORTABLE CHEST - 1 VIEW  Comparison: 09/06/2012.  Findings: Low volume chest.  Head is draped over the upper chest. Under penetrated exam due to body habitus.  No focal consolidation allowing for technical issues.  Basilar atelectasis.  IMPRESSION: Low volume chest.  No gross acute cardiopulmonary disease or convincing evidence of interval change compared to prior.  Consider follow-up departmental PA and lateral chest.   Original Report Authenticated By: Andreas Newport, M.D.     IV fluids. IV Zofran. IV Phenergan.  Ativan provided   Date: 11/15/2012  Rate: 80  Rhythm: normal sinus rhythm  QRS Axis: normal  Intervals: normal  ST/T Wave abnormalities: nonspecific ST changes  Conduction Disutrbances:none  Narrative Interpretation:   Old EKG Reviewed: unchanged  Persistent vomiting despite multiple antiemetics and IV fluids. Medicine consult for admission  MDM  EKG, imaging, labs obtained and reviewed as above  IV fluids and multiple medications provided  Symptoms unchanged   Medicine consult for admission        Sunnie Nielsen, MD 11/15/12 [phone]

## 2012-11-15 NOTE — H&P (Signed)
History and Physical       Hospital Admission Note Date: 11/15/2012  Patient name: [name] Medical record number: [id number] Date of birth: 1951/04/12 Age: 62 y.o. Gender: female PCP: Thora Lance, MD    Chief Complaint:  Nausea vomiting and diarrhea for last 3-4 days  HPI: Patient is a 62 year old female with history of bipolar disorder,obesity, hypertension, hyperlipidemia, obstructive sleep apnea on CPAP presented to ED with nausea vomiting and diarrhea for last 3 days. Patient states that she regularly goes to SNF to see her mother and daycare to pick up her grand-child. On Sunday night, 4 days ago, she developed intractable nausea vomiting and diarrhea. She also had diffuse crampy abdominal pain which has improved, now 4/10.  She denied any fevers or chills or any recent travels. She denies any hematemesis, hematochezia or melena. She states that diarrhea has been improving. But she has not been able to hold any food down in the last 3 days due to intractable nausea vomiting.She denied being on antibiotics in the last 3 months.  Review of Systems:  Constitutional: Denies fever, chills, diaphoresis,+Poor appetite and cannot hold anything down and fatigue.  HEENT: Denies photophobia, eye pain, redness, hearing loss, ear pain, congestion, sore throat, rhinorrhea, sneezing, mouth sores, trouble swallowing, neck pain, neck stiffness and tinnitus.   Respiratory: Denies SOB, DOE, cough, chest tightness,  and wheezing.   Cardiovascular: Denies chest pain, palpitations and leg swelling.  Gastrointestinal: please see history of present illness Genitourinary: Denies dysuria, urgency, frequency, hematuria, flank pain and difficulty urinating.  Musculoskeletal: Denies myalgias, back pain, joint swelling, arthralgias and gait problem.  Skin: Denies pallor, rash and wound.  Neurological: Denies seizures, syncope, numbness and headaches.  + endorses having dizziness, lightheadedness and generalized weakness Hematological: Denies adenopathy. Easy bruising, personal or family bleeding history  Psychiatric/Behavioral:patient has a history of bipolar disorder and short-term memory loss and significant anxiety, becoming tearful during the encounter  Past Medical History: Past Medical History  Diagnosis Date  . Arthritis   . Asthma   . Diabetes mellitus   . CHF (congestive heart failure)   . Hypertension   . Hyperlipidemia   . Pericarditis, viral 2010 October  . Depression   . Anxiety   . Bipolar 1 disorder   . Hernia   . Sleep apnea    Past Surgical History  Procedure Laterality Date  . Abdominal hysterectomy    . Foot surgery      bone spurs   . Cholecystectomy  1984    open  . Appendectomy    . Knee arthroscopy  09/07/2012    Procedure: ARTHROSCOPY KNEE;  Surgeon: Nadara Mustard, MD;  Location: [facility] OR;  Service: Orthopedics;  Laterality: Left;  Left Knee Arthroscopy    Medications: Prior to Admission medications   Medication Sig Start Date End Date Taking? Authorizing Provider  ALPRAZolam (XANAX XR) 1 MG 24 hr tablet Take 1 mg by mouth 2 (two) times daily.   Yes Historical Provider, MD  ALPRAZolam Prudy Feeler) 0.5 MG tablet Take 0.5 mg by mouth 3 (three) times daily as needed for anxiety.    Yes Historical Provider, MD  aspirin 81 MG tablet Take 81 mg by mouth daily.     Yes Historical Provider, MD  etodolac (LODINE) 400 MG tablet Take 400 mg by mouth daily.    Yes Historical Provider, MD  furosemide (LASIX) 20 MG tablet Take 20 mg by mouth daily as needed (for severe swelling).    Yes  Historical Provider, MD  lamoTRIgine (LAMICTAL) 150 MG tablet Take 225 mg by mouth 2 (two) times daily. 1 and 1/2 tablet BID   Yes Historical Provider, MD  levocetirizine (XYZAL) 5 MG tablet Take 5 mg by mouth every evening.   Yes Historical Provider, MD  levothyroxine (SYNTHROID, LEVOTHROID) 75 MCG tablet Take 75 mcg by mouth daily.    Yes Historical Provider, MD  losartan (COZAAR) 100 MG tablet Take 100 mg by mouth daily.     Yes Historical Provider, MD  metformin (FORTAMET) 500 MG (OSM) 24 hr tablet Take 500 mg by mouth at bedtime.   Yes Historical Provider, MD  potassium chloride SA (K-DUR,KLOR-CON) 20 MEQ tablet Take 20 mEq by mouth daily.   Yes Historical Provider, MD  QUEtiapine (SEROQUEL) 400 MG tablet Take 400 mg by mouth 2 (two) times daily.   Yes Historical Provider, MD  simvastatin (ZOCOR) 40 MG tablet Take 40 mg by mouth at bedtime.     Yes Historical Provider, MD  zolpidem (AMBIEN) 10 MG tablet Take 10 mg by mouth at bedtime as needed.     Yes Historical Provider, MD    Allergies:   Allergies  Allergen Reactions  . Celexa (Citalopram Hydrobromide) Nausea Only  . Maxzide (Triamterene-Hctz)     Hurt patients kidneys  . Reglan (Metoclopramide) Nausea Only  . Risperdal (Risperidone) Nausea Only    Also causes hallucinations  . Trazodone And Nefazodone Nausea Only  . Zestril (Lisinopril) Nausea Only  . Lithium Palpitations and Other (See Comments)    Shakes and delusional pt started seeing things      Social History:  reports that she has never smoked. She has never used smokeless tobacco. She reports that she does not drink alcohol or use illicit drugs.she lives at home with her husband and is functional with her ADLs  Family History: History reviewed. No pertinent family history.  Physical Exam: Blood pressure 126/100, pulse 72, temperature 98.1 F (36.7 C), temperature source Oral, resp. rate 16, SpO2 97.00%. General: Alert, awake, oriented x3, in no acute distress. HEENT: normocephalic, atraumatic, anicteric sclera, pink conjunctiva, pupils equal and reactive to light and accomodation, oropharynx clear Neck: supple, no masses or lymphadenopathy, no goiter, no bruits  Heart: Regular rate and rhythm, without murmurs, rubs or gallops. Lungs: Clear to auscultation bilaterally, no wheezing, rales or  rhonchi. Abdomen: Soft, nontender, nondistended, positive bowel sounds, no masses. Extremities: No clubbing, cyanosis or edema with positive pedal pulses. Neuro: Grossly intact, no focal neurological deficits, strength 5/5 upper and lower extremities bilaterally Psych: alert and oriented x 3, very anxious and tearful during the encounter Skin: no rashes or lesions, warm and dry   LABS on Admission:  Basic Metabolic Panel:  Recent Labs Lab 11/15/12 0612  NA 142  K 3.7  CL 105  GLUCOSE 179*  BUN 17  CREATININE 0.70  CBC:  Recent Labs Lab 11/15/12 0538 11/15/12 0612  WBC 8.7  --   HGB 13.2 13.6  HCT 38.3 40.0  MCV 87.2  --   PLT 252  --      Radiological Exams on Admission: Dg Chest Portable 1 View  11/15/2012  *RADIOLOGY REPORT*  Clinical Data: Short of breath.  PORTABLE CHEST - 1 VIEW  Comparison: 09/06/2012.  Findings: Low volume chest.  Head is draped over the upper chest. Under penetrated exam due to body habitus.  No focal consolidation allowing for technical issues.  Basilar atelectasis.  IMPRESSION: Low volume chest.  No gross acute  cardiopulmonary disease or convincing evidence of interval change compared to prior.  Consider follow-up departmental PA and lateral chest.   Original Report Authenticated By: Andreas Newport, M.D.     Assessment/Plan Principal Problem:   Nausea and vomiting with diarrhea: likely viral gastroenteritis,  - Admit to MedSurg for observation, obtain CT abdomen and pelvis to rule out any colitis which will require antibiotics - obtain stool cultures, O&P, GI viral pathogen - place on antiemetics, IV fluids, clear liquid diet and supportive treatment.  - I did not appreciate any significant abdominal tenderness on exam.   Active Problems:  Diabetes mellitus: - Obtain hemoglobin A1c, place on sliding scale insulin    HYPERLIPIDEMIA-MIXED: continue statins    OBESITY-MORBID  - Patient was counseled for diet control and weight control     HYPERTENSION, UNSPECIFIED - Hold Lasix for now, continue losartan    OSA on CPAP - Patient needs CPAP at night, place per RT orders    Bipolar 1 disorder, depressed - She has significant anxiety and psychiatric issues, we'll continue Xanax, Lamictal, Seroquel  DVT prophylaxis: SCDs  CODE STATUS: Full code  Further plan will depend as patient's clinical course evolves and further radiologic and laboratory data become available.   Time Spent on Admission: 1 hour  RAI,RIPUDEEP M.D. [facility] 11/15/2012, 8:15 AM Pager: [phone]  If 7PM-7AM, please contact night-coverage [email or web address] Password [email or web address]

## 2012-11-16 DIAGNOSIS — F3130 Bipolar disorder, current episode depressed, mild or moderate severity, unspecified: Secondary | ICD-10-CM | POA: Diagnosis not present

## 2012-11-16 DIAGNOSIS — R079 Chest pain, unspecified: Secondary | ICD-10-CM | POA: Diagnosis not present

## 2012-11-16 DIAGNOSIS — R1115 Cyclical vomiting syndrome unrelated to migraine: Secondary | ICD-10-CM | POA: Diagnosis not present

## 2012-11-16 DIAGNOSIS — R197 Diarrhea, unspecified: Secondary | ICD-10-CM | POA: Diagnosis not present

## 2012-11-16 LAB — BASIC METABOLIC PANEL
BUN: 12 mg/dL (ref 6–23)
Creatinine, Ser: 0.77 mg/dL (ref 0.50–1.10)
GFR calc Af Amer: >90 mL/min (ref >90)
GFR calc non Af Amer: 89 mL/min — ABNORMAL LOW (ref >90)
Potassium: 4.2 mEq/L (ref 3.5–5.1)

## 2012-11-16 LAB — CBC
Hemoglobin: 12.1 g/dL (ref 12.0–15.0)
MCHC: 33 g/dL (ref 30.0–36.0)
RDW: 14.3 % (ref 11.5–15.5)

## 2012-11-16 LAB — URINE CULTURE

## 2012-11-16 LAB — GLUCOSE, CAPILLARY: Glucose-Capillary: 142 mg/dL — ABNORMAL HIGH (ref 70–99)

## 2012-11-16 MED ORDER — ONDANSETRON 4 MG PO TBDP: 4.0000 mg | ORAL_TABLET | Freq: Three times a day (TID) | ORAL | Status: DC | PRN

## 2012-11-16 NOTE — Progress Notes (Signed)
Patient consumed 100% of lunch without difficulty.  Denies nausea, vomiting or diarrhea.  Resident states she feels "good" and ready to go home.  Alert and oriented x3.  Denies discomfort.  Will review discharge instructions.   Kelli Hope M

## 2012-11-16 NOTE — Discharge Summary (Signed)
Physician Discharge Summary  Patient ID: [name] MRN: [id number] DOB/AGE: 1950/10/29 62 y.o.  Admit date: 11/15/2012 Discharge date: 11/16/2012  Primary Care Physician:  Thora Lance, MD  Discharge Diagnoses:    . Nausea and vomiting, Diarrhea likely viral gastroenteritis- RESOLVED . HYPERLIPIDEMIA-MIXED . OBESITY-MORBID (>100') . HYPERTENSION, UNSPECIFIED . OSA on CPAP . Bipolar 1 disorder, depressed  Consults:  None   Discharge Medications:   Medication List    STOP taking these medications       lisinopril 20 MG tablet  Commonly known as:  PRINIVIL,ZESTRIL      TAKE these medications       ALPRAZolam 0.5 MG tablet  Commonly known as:  XANAX  Take 0.5 mg by mouth 3 (three) times daily as needed for anxiety.     ALPRAZolam 1 MG 24 hr tablet  Commonly known as:  XANAX XR  Take 1 mg by mouth 2 (two) times daily.     aspirin 81 MG tablet  Take 81 mg by mouth daily.     etodolac 400 MG tablet  Commonly known as:  LODINE  Take 400 mg by mouth daily.     furosemide 20 MG tablet  Commonly known as:  LASIX  Take 20 mg by mouth daily as needed (for severe swelling).     lamoTRIgine 150 MG tablet  Commonly known as:  LAMICTAL  Take 225 mg by mouth 2 (two) times daily. 1 and 1/2 tablet BID     levocetirizine 5 MG tablet  Commonly known as:  XYZAL  Take 5 mg by mouth every evening.     levothyroxine 75 MCG tablet  Commonly known as:  SYNTHROID, LEVOTHROID  Take 75 mcg by mouth daily.     losartan 100 MG tablet  Commonly known as:  COZAAR  Take 100 mg by mouth daily.     metformin 500 MG (OSM) 24 hr tablet  Commonly known as:  FORTAMET  Take 500 mg by mouth at bedtime.     ondansetron 4 MG disintegrating tablet  Commonly known as:  ZOFRAN ODT  Take 1 tablet (4 mg total) by mouth every 8 (eight) hours as needed for nausea.     potassium chloride SA 20 MEQ tablet  Commonly known as:  K-DUR,KLOR-CON  Take 20 mEq by mouth daily.     QUEtiapine  400 MG tablet  Commonly known as:  SEROQUEL  Take 400 mg by mouth 2 (two) times daily.     simvastatin 40 MG tablet  Commonly known as:  ZOCOR  Take 40 mg by mouth at bedtime.     zolpidem 10 MG tablet  Commonly known as:  AMBIEN  Take 10 mg by mouth at bedtime as needed.         Brief H and P: For complete details please refer to admission H and P, but in brief Patient is a 61 year old female with history of bipolar disorder,obesity, hypertension, hyperlipidemia, obstructive sleep apnea on CPAP presented to ED with nausea vomiting and diarrhea for last 3 days. Patient stated that she regularly goes to SNF to see her mother and daycare to pick up her grand-child. 4 days ago, she developed intractable nausea vomiting and diarrhea. She also had diffuse crampy abdominal pain which has improved, now 4/10. She denied any fevers or chills or any recent travels. She denies any hematemesis, hematochezia or melena. She as reported that her diarrhea was improving but she was not able to hold any food down  in the last 3 days due to intractable nausea vomiting.She denied being on antibiotics in the last 3 months.   Hospital Course:  Nausea, vomiting or diarrhea likely secondary to viral gastroenteritis. CT abdomen and pelvis was obtained which showed no colitis or enteritis. Stool cultures, O&P, GI viral pathogen were ordered however patient had no bowel movement during the hospitalization. She was placed on antiemetics, IV fluids and initially clear liquid diet for supportive treatment. Patient's symptoms quickly resolved and at the time of discharge, she was tolerating solids without any difficulty.   Day of Discharge BP 130/64  Pulse 79  Temp(Src) 97.5 F (36.4 C) (Oral)  Resp 20  Ht 5\' 5"  (1.651 m)  Wt 149.9 kg (330 lb 7.5 oz)  BMI 54.99 kg/m2  SpO2 95%  Physical Exam: General: Alert and awake oriented x3 not in any acute distress. CVS: S1-S2 clear no murmur rubs or gallops Chest:  clear to auscultation bilaterally, no wheezing rales or rhonchi Abdomen: obese,soft nontender, nondistended, normal bowel sounds Extremities: no cyanosis, clubbing or edema noted bilaterally    The results of significant diagnostics from this hospitalization (including imaging, microbiology, ancillary and laboratory) are listed below for reference.    LAB RESULTS: Basic Metabolic Panel:  Recent Labs Lab 11/15/12 0612 11/16/12 0630  NA 142 140  K 3.7 4.2  CL 105 103  CO2  --  24  GLUCOSE 179* 125*  BUN 17 12  CREATININE 0.70 0.77  CALCIUM  --  8.7  CBC:  Recent Labs Lab 11/15/12 0538 11/15/12 0612 11/16/12 0630  WBC 8.7  --  5.7  HGB 13.2 13.6 12.1  HCT 38.3 40.0 36.7  MCV 87.2  --  91.3  PLT 252  --  219   CBG:  Recent Labs Lab 11/16/12 0806 11/16/12 1211  GLUCAP 124* 142*    Significant Diagnostic Studies:  Ct Abdomen Pelvis W Contrast  11/15/2012  *RADIOLOGY REPORT*  Clinical Data: Nausea, vomiting and diarrhea for 3 days.  CT ABDOMEN AND PELVIS WITH CONTRAST  Technique:  Multidetector CT imaging of the abdomen and pelvis was performed following the standard protocol during bolus administration of intravenous contrast.  Contrast: OMNIPAQUE IOHEXOL 300 MG/ML  SOLN  Comparison: CT abdomen and pelvis 07/22/2011.  Findings: There is cardiomegaly.  No pleural or pericardial effusion.  Dependent atelectasis is seen in the lung bases.  The liver is diffusely low attenuating consistent with fatty infiltration.  No focal liver lesion is identified.  The patient is status post cholecystectomy.  The spleen, kidneys, adrenal glands, pancreas and biliary tree are unremarkable.  The patient is status post hysterectomy.  Tiny fat containing umbilical hernia is noted. Small fat containing right lateral wall hernia is also unchanged. There is no lymphadenopathy or fluid.  Scattered colonic diverticula without diverticulitis are noted.  The stomach and small and large bowel are  unremarkable.  There is no lymphadenopathy or fluid.  Multilevel degenerative disc disease is seen.  No lytic or sclerotic bony lesion is identified.  IMPRESSION:  1.  No acute finding. 2.  Diffuse fatty infiltration of the liver. 3.  Mild cardiomegaly. 4.  Status post cholecystectomy and hysterectomy. 5.  Scattered colonic diverticula without diverticulitis. 6.  No change in a mall right lateral abdominal hernia.   Original Report Authenticated By: Holley Dexter, M.D.    Dg Chest Portable 1 View  11/15/2012  *RADIOLOGY REPORT*  Clinical Data: Short of breath.  PORTABLE CHEST - 1 VIEW  Comparison: 09/06/2012.  Findings: Low volume chest.  Head is draped over the upper chest. Under penetrated exam due to body habitus.  No focal consolidation allowing for technical issues.  Basilar atelectasis.  IMPRESSION: Low volume chest.  No gross acute cardiopulmonary disease or convincing evidence of interval change compared to prior.  Consider follow-up departmental PA and lateral chest.   Original Report Authenticated By: Andreas Newport, M.D.        Disposition and Follow-up:     Discharge Orders   Future Orders Complete By Expires     Diet Carb Modified  As directed     Discharge instructions  As directed     Comments:      Please avoid spicy or fatty foods for a few days    Increase activity slowly  As directed         DISPOSITION:home DIET:carb modified ACTIVITY: as tolerated   DISCHARGE FOLLOW-UP Follow-up Information   Follow up with Thora Lance, MD. Schedule an appointment as soon as possible for a visit in 10 days. (for hospital follow-up)    Contact information:   [address] [place] [address] [phone]       Time spent on Discharge: 30 mins  Signed:   Graden Hoshino M.D. [facility] 11/16/2012, 2:08 PM Pager: [phone]

## 2012-11-16 NOTE — Progress Notes (Signed)
Discharge instructions reviewed with patient.  All questions answered.  Hard script given to patient for Zofran as ordered.  Patient is alert and oriented.  Ambulating around room without difficulty.  Belongings packed by spouse.  Resident ambulated out of facility without difficulty at this time.    Kelli Hope M

## 2013-01-08 DIAGNOSIS — F319 Bipolar disorder, unspecified: Secondary | ICD-10-CM | POA: Diagnosis not present

## 2013-02-05 DIAGNOSIS — F319 Bipolar disorder, unspecified: Secondary | ICD-10-CM | POA: Diagnosis not present

## 2013-02-27 DIAGNOSIS — F319 Bipolar disorder, unspecified: Secondary | ICD-10-CM | POA: Diagnosis not present

## 2013-05-20 DIAGNOSIS — F319 Bipolar disorder, unspecified: Secondary | ICD-10-CM | POA: Diagnosis not present

## 2013-07-04 ENCOUNTER — Encounter: Payer: Self-pay | Admitting: Gynecology

## 2013-07-04 ENCOUNTER — Ambulatory Visit (INDEPENDENT_AMBULATORY_CARE_PROVIDER_SITE_OTHER): Payer: BC Managed Care – PPO | Admitting: Gynecology

## 2013-07-04 VITALS — BP 138/80 | Ht 65.0 in | Wt 303.0 lb

## 2013-07-04 DIAGNOSIS — N951 Menopausal and female climacteric states: Secondary | ICD-10-CM

## 2013-07-04 DIAGNOSIS — Z01419 Encounter for gynecological examination (general) (routine) without abnormal findings: Secondary | ICD-10-CM

## 2013-07-04 DIAGNOSIS — E663 Overweight: Secondary | ICD-10-CM

## 2013-07-04 NOTE — Progress Notes (Signed)
[name] [birth year] [id number]   History:    61 y.o.  for annual gyn exam who has not seen a gynecologist in over 5 years. Patient many years ago had a transvaginal hysterectomy with ovarian conservation. Patient's mammogram has been over 5 years. Patient refuses to have colonoscopy. Patient with no prior history of ever having had a bone density study. Patient is morbidly obese. Her PCP is Dr. Blair Heys. Patient requesting flu vaccine today.  Past medical history,surgical history, family history and social history were all reviewed and documented in the EPIC chart.  Gynecologic History No LMP recorded. Patient has had a hysterectomy. Contraception: post menopausal status Last Pap: over 5 years ago. Results were: normal Last mammogram: over 5 years ago. Results were: normal  Obstetric History OB History  Gravida Para Term Preterm AB SAB TAB Ectopic Multiple Living  2 2        2     # Outcome Date GA Lbr Len/2nd Weight Sex Delivery Anes PTL Lv  2 PAR           1 PAR                ROS: A ROS was performed and pertinent positives and negatives are included in the history.  GENERAL: No fevers or chills. HEENT: No change in vision, no earache, sore throat or sinus congestion. NECK: No pain or stiffness. CARDIOVASCULAR: No chest pain or pressure. No palpitations. PULMONARY: No shortness of breath, cough or wheeze. GASTROINTESTINAL: No abdominal pain, nausea, vomiting or diarrhea, melena or bright red blood per rectum. GENITOURINARY: No urinary frequency, urgency, hesitancy or dysuria. MUSCULOSKELETAL: No joint or muscle pain, no back pain, no recent trauma. DERMATOLOGIC: No rash, no itching, no lesions. ENDOCRINE: No polyuria, polydipsia, no heat or cold intolerance. No recent change in weight. HEMATOLOGICAL: No anemia or easy bruising or bleeding. NEUROLOGIC: No headache, seizures, numbness, tingling or weakness. PSYCHIATRIC: No depression, no loss of interest in normal activity or  change in sleep pattern.     Exam: chaperone present  BP 138/80  Ht 5\' 5"  (1.651 m)  Wt 303 lb (137.44 kg)  BMI 50.42 kg/m2  Body mass index is 50.42 kg/(m^2).  General appearance : Well developed well nourished female. No acute distress HEENT: Neck supple, trachea midline, no carotid bruits, no thyroidmegaly Lungs: Clear to auscultation, no rhonchi or wheezes, or rib retractions  Heart: Regular rate and rhythm, no murmurs or gallops Breast:Examined in sitting and supine position were symmetrical in appearance, no palpable masses or tenderness,  no skin retraction, no nipple inversion, no nipple discharge, no skin discoloration, no axillary or supraclavicular lymphadenopathy Abdomen: no palpable masses or tenderness, no rebound or guarding Extremities: no edema or skin discoloration or tenderness  Pelvic:  Bartholin, Urethra, Skene Glands: Within normal limits             Vagina: No gross lesions or discharge  Cervix: absent  Uterus  absent  Adnexa  Without masses or tenderness  Anus and perineum  normal   Rectovaginal  normal sphincter tone without palpated masses or tenderness             Hemoccult card provided     Assessment/Plan:  62 y.o. female for annual exam who is not received any gynecological care over 5 years. Patient is morbidly obese making it difficult to evaluate her adnexa. For this reason the patient will return back to the office in the next several weeks for an ultrasound for  better assessment of right adnexa. She was given a requisition is scheduled for mammogram. She was given Hemoccult card to submit to the office for testing. We discussed importance of calcium and vitamin D in regular exercise for osteoporosis prevention. We'll schedule a bone density study sometime in January.  Note: This dictation was prepared with  Dragon/digital dictation along withSmart phrase technology. Any transcriptional errors that result from this process are  unintentional.   Ok Edwards MD, 3:06 PM 07/04/2013

## 2013-07-04 NOTE — Patient Instructions (Signed)
Influenza Vaccine (Flu Vaccine, Inactivated) 2013 2014 What You Need to Know WHY GET VACCINATED?  Influenza ("flu") is a contagious disease that spreads around the [place] every winter, usually between October and May.  Flu is caused by the influenza virus, and can be spread by coughing, sneezing, and close contact.  Anyone can get flu, but the risk of getting flu is highest among children. Symptoms come on suddenly and may last several days. They can include:  Fever or chills.  Sore throat.  Muscle aches.  Fatigue.  Cough.  Headache.  Runny or stuffy nose. Flu can make some people much sicker than others. These people include young children, people 65 and older, pregnant women, and people with certain health conditions such as heart, lung or kidney disease, or a weakened immune system. Flu vaccine is especially important for these people, and anyone in close contact with them. Flu can also lead to pneumonia, and make existing medical conditions worse. It can cause diarrhea and seizures in children. Each year thousands of people in the [place] die from flu, and many more are hospitalized. Flu vaccine is the best protection we have from flu and its complications. Flu vaccine also helps prevent spreading flu from person to person. INACTIVATED FLU VACCINE There are 2 types of influenza vaccine:  You are getting an inactivated flu vaccine, which does not contain any live influenza virus. It is given by injection with a needle, and often called the "flu shot."  A different live, attenuated (weakened) influenza vaccine is sprayed into the nostrils. This vaccine is described in a separate Vaccine Information Statement. Flu vaccine is recommended every year. Children 6 months through 8 years of age should get 2 doses the first year they get vaccinated. Flu viruses are always changing. Each year's flu vaccine is made to protect from viruses that are most likely to cause disease  that year. While flu vaccine cannot prevent all cases of flu, it is our best defense against the disease. Inactivated flu vaccine protects against 3 or 4 different influenza viruses. It takes about 2 weeks for protection to develop after the vaccination, and protection lasts several months to a year. Some illnesses that are not caused by influenza virus are often mistaken for flu. Flu vaccine will not prevent these illnesses. It can only prevent influenza. A "high-dose" flu vaccine is available for people 65 years of age and older. The person giving you the vaccine can tell you more about it. Some inactivated flu vaccine contains a very small amount of a mercury-based preservative called thimerosal. Studies have shown that thimerosal in vaccines is not harmful, but flu vaccines that do not contain a preservative are available. SOME PEOPLE SHOULD NOT GET THIS VACCINE Tell the person who gives you the vaccine:  If you have any severe (life-threatening) allergies. If you ever had a life-threatening allergic reaction after a dose of flu vaccine, or have a severe allergy to any part of this vaccine, you may be advised not to get a dose. Most, but not all, types of flu vaccine contain a small amount of egg.  If you ever had Guillain Barr Syndrome (a severe paralyzing illness, also called GBS). Some people with a history of GBS should not get this vaccine. This should be discussed with your doctor.  If you are not feeling well. They might suggest waiting until you feel better. But you should come back. RISKS OF A VACCINE REACTION With a vaccine, like any medicine, there   If you are not feeling well. They might suggest waiting until you feel better. But you should come back.  RISKS OF A VACCINE REACTION  With a vaccine, like any medicine, there is a chance of side effects. These are usually mild and go away on their own.  Serious side effects are also possible, but are very rare. Inactivated flu vaccine does not contain live flu virus, sogetting flu from this vaccine is not possible.  Brief fainting spells and related symptoms (such as jerking movements) can happen after any medical  procedure, including vaccination. Sitting or lying down for about 15 minutes after a vaccination can help prevent fainting and injuries caused by falls. Tell your doctor if you feel dizzy or lightheaded, or have vision changes or ringing in the ears.  Mild problems following inactivated flu vaccine:  · Soreness, redness, or swelling where the shot was given.  · Hoarseness; sore, red or itchy eyes; or cough.  · Fever.  · Aches.  · Headache.  · Itching.  · Fatigue.  If these problems occur, they usually begin soon after the shot and last 1 or 2 days.  Moderate problems following inactivated flu vaccine:  · Young children who get inactivated flu vaccine and pneumococcal vaccine (PCV13) at the same time may be at increased risk for seizures caused by fever. Ask your doctor for more information. Tell your doctor if a child who is getting flu vaccine has ever had a seizure.  Severe problems following inactivated flu vaccine:  · A severe allergic reaction could occur after any vaccine (estimated less than 1 in a million doses).  · There is a small possibility that inactivated flu vaccine could be associated with Guillan Barré Syndrome (GBS), no more than 1 or 2 cases per million people vaccinated. This is much lower than the risk of severe complications from flu, which can be prevented by flu vaccine.  The safety of vaccines is always being monitored. For more information, visit: www.cdc.gov/vaccinesafety/  WHAT IF THERE IS A SERIOUS REACTION?  What should I look for?  · Look for anything that concerns you, such as signs of a severe allergic reaction, very high fever, or behavior changes.  Signs of a severe allergic reaction can include hives, swelling of the face and throat, difficulty breathing, a fast heartbeat, dizziness, and weakness. These would start a few minutes to a few hours after the vaccination.  What should I do?  · If you think it is a severe allergic reaction or other emergency that cannot wait, call 9 1 1  or get the person to the nearest hospital. Otherwise, call your doctor.  · Afterward, the reaction should be reported to the Vaccine Adverse Event Reporting System (VAERS). Your doctor might file this report, or you can do it yourself through the VAERS website at www.vaers.hhs.gov, or by calling 1-800-822-7967.  VAERS is only for reporting reactions. They do not give medical advice.  THE NATIONAL VACCINE INJURY COMPENSATION PROGRAM  The National Vaccine Injury Compensation Program (VICP) is a federal program that was created to compensate people who may have been injured by certain vaccines.  Persons who believe they may have been injured by a vaccine can learn about the program and about filing a claim by calling 1-800-338-2382 or visiting the VICP website at www.hrsa.gov/vaccinecompensation  HOW CAN I LEARN MORE?  · Ask your doctor.  · Call your local or state health department.  · Contact the Centers for Disease Control and Prevention (CDC):  ·

## 2013-07-24 ENCOUNTER — Ambulatory Visit (INDEPENDENT_AMBULATORY_CARE_PROVIDER_SITE_OTHER): Payer: BC Managed Care – PPO

## 2013-07-24 ENCOUNTER — Encounter: Payer: Self-pay | Admitting: Gynecology

## 2013-07-24 ENCOUNTER — Ambulatory Visit (INDEPENDENT_AMBULATORY_CARE_PROVIDER_SITE_OTHER): Payer: BC Managed Care – PPO | Admitting: Gynecology

## 2013-07-24 ENCOUNTER — Other Ambulatory Visit: Payer: Self-pay | Admitting: Gynecology

## 2013-07-24 DIAGNOSIS — N83339 Acquired atrophy of ovary and fallopian tube, unspecified side: Secondary | ICD-10-CM

## 2013-07-24 DIAGNOSIS — L909 Atrophic disorder of skin, unspecified: Secondary | ICD-10-CM

## 2013-07-24 DIAGNOSIS — N942 Vaginismus: Secondary | ICD-10-CM

## 2013-07-24 DIAGNOSIS — E663 Overweight: Secondary | ICD-10-CM

## 2013-07-24 DIAGNOSIS — E65 Localized adiposity: Secondary | ICD-10-CM

## 2013-07-24 NOTE — Progress Notes (Signed)
  Patient is a 62 year old who was seen in the office for her annual exam 07/04/2013. See previous note for detail. Patient was asked to return to the office for an ultrasound for better assessment of her adnexa because she is morbidly obese who currently weighs 303 pounds (BMI 50.42 KG per meter square). The patient many years ago had a transvaginal hysterectomy for benign pathology.  Ultrasound report: Absent uterus. Right ovary and left ovary not seen. No fluid in the cul-de-sac. No masses on either right or left adnexa.  Assessment/plan: Transvaginal ultrasound for assessment of ovaries was normal. This was done due to the fact that patient is morbidly obese and has pendulous abdomen. Patient had otherwise been asymptomatic. Patient was reminded to schedule her bone density study as well as for colonoscopy.we'll see her back in one year or when necessary.

## 2013-07-24 NOTE — Patient Instructions (Signed)
Shingles Vaccine  What You Need to Know  WHAT IS SHINGLES?  · Shingles is a painful skin rash, often with blisters. It is also called Herpes Zoster or just Zoster.  · A shingles rash usually appears on one side of the face or body and lasts from 2 to 4 weeks. Its main symptom is pain, which can be quite severe. Other symptoms of shingles can include fever, headache, chills, and upset stomach. Very rarely, a shingles infection can lead to pneumonia, hearing problems, blindness, brain inflammation (encephalitis), or death.  · For about 1 person in 5, severe pain can continue even after the rash clears up. This is called post-herpetic neuralgia.  · Shingles is caused by the Varicella Zoster virus. This is the same virus that causes chickenpox. Only someone who has had a case of chickenpox or rarely, has gotten chickenpox vaccine, can get shingles. The virus stays in your body. It can reappear many years later to cause a case of shingles.  · You cannot catch shingles from another person with shingles. However, a person who has never had chickenpox (or chickenpox vaccine) could get chickenpox from someone with shingles. This is not very common.  · Shingles is far more common in people 50 and older than in younger people. It is also more common in people whose immune systems are weakened because of a disease such as cancer or drugs such as steroids or chemotherapy.  · At least 1 million people get shingles per year in the [place].  SHINGLES VACCINE  · A vaccine for shingles was licensed in 2006. In clinical trials, the vaccine reduced the risk of shingles by 50%. It can also reduce the pain in people who still get shingles after being vaccinated.  · A single dose of shingles vaccine is recommended for adults 60 years of age and older.  SOME PEOPLE SHOULD NOT GET SHINGLES VACCINE OR SHOULD WAIT  A person should not get shingles vaccine if he or she:  · Has ever had a life-threatening allergic reaction to gelatin, the  antibiotic neomycin, or any other component of shingles vaccine. Tell your caregiver if you have any severe allergies.  · Has a weakened immune system because of current:  · AIDS or another disease that affects the immune system.  · Treatment with drugs that affect the immune system, such as prolonged use of high-dose steroids.  · Cancer treatment, such as radiation or chemotherapy.  · Cancer affecting the bone marrow or lymphatic system, such as leukemia or lymphoma.  · Is pregnant, or might be pregnant. Women should not become pregnant until at least 4 weeks after getting shingles vaccine.  Someone with a minor illness, such as a cold, may be vaccinated. Anyone with a moderate or severe acute illness should usually wait until he or she recovers before getting the vaccine. This includes anyone with a temperature of 101.3° F (38° C) or higher.  WHAT ARE THE RISKS FROM SHINGLES VACCINE?  · A vaccine, like any medicine, could possibly cause serious problems, such as severe allergic reactions. However, the risk of a vaccine causing serious harm, or death, is extremely small.  · No serious problems have been identified with shingles vaccine.  Mild Problems  · Redness, soreness, swelling, or itching at the site of the injection (about 1 person in 3).  · Headache (about 1 person in 70).  Like all vaccines, shingles vaccine is being closely monitored for unusual or severe problems.  WHAT IF   THERE IS A MODERATE OR SEVERE REACTION?  What should I look for?  Any unusual condition, such as a severe allergic reaction or a high fever. If a severe allergic reaction occurred, it would be within a few minutes to an hour after the shot. Signs of a serious allergic reaction can include difficulty breathing, weakness, hoarseness or wheezing, a fast heartbeat, hives, dizziness, paleness, or swelling of the throat.  What should I do?  · Call your caregiver, or get the person to a caregiver right away.  · Tell the caregiver what  happened, the date and time it happened, and when the vaccination was given.  · Ask the caregiver to report the reaction by filing a Vaccine Adverse Event Reporting System (VAERS) form. Or, you can file this report through the VAERS web site at www.vaers.hhs.gov or by calling 1-800-822-7967.  VAERS does not provide medical advice.  HOW CAN I LEARN MORE?  · Ask your caregiver. He or she can give you the vaccine package insert or suggest other sources of information.  · Contact the Centers for Disease Control and Prevention (CDC):  · Call 1-800-232-4636 (1-800-CDC-INFO).  · Visit the CDC website at www.cdc.gov/vaccines  CDC Shingles Vaccine VIS (05/27/08)  Document Released: 06/05/2006 Document Revised: 10/31/2011 Document Reviewed: 11/28/2012  Exit[employer]® Patient Information ©2014 [employer].

## 2013-08-12 DIAGNOSIS — F319 Bipolar disorder, unspecified: Secondary | ICD-10-CM | POA: Diagnosis not present

## 2013-08-19 ENCOUNTER — Telehealth: Payer: Self-pay | Admitting: *Deleted

## 2013-08-19 MED ORDER — FLUCONAZOLE 150 MG PO TABS: ORAL_TABLET | ORAL | Status: DC

## 2013-08-19 NOTE — Telephone Encounter (Signed)
(  [name]) pt called c/o yeast infection x 3 days now, tired OTC monistat no relief. Pt is diabetic, she is requesting Rx sent, last OV 07/24/13. Please advise

## 2013-08-19 NOTE — Telephone Encounter (Signed)
Diflucan 150, repeat in 3 days. If no relief office visit.

## 2013-08-19 NOTE — Telephone Encounter (Signed)
Pt informed, rx sent 

## 2013-08-21 ENCOUNTER — Ambulatory Visit (INDEPENDENT_AMBULATORY_CARE_PROVIDER_SITE_OTHER): Payer: BC Managed Care – PPO | Admitting: Gynecology

## 2013-08-21 ENCOUNTER — Encounter: Payer: Self-pay | Admitting: Gynecology

## 2013-08-21 DIAGNOSIS — L738 Other specified follicular disorders: Secondary | ICD-10-CM

## 2013-08-21 DIAGNOSIS — L739 Follicular disorder, unspecified: Secondary | ICD-10-CM

## 2013-08-21 NOTE — Progress Notes (Signed)
   Patient is a 62 year old who presented to the office today stating that this morning when she was bathing she noticed a little nodular area and wanted to be checked. She is not sexually active. She does have history of hyperlipidemia, as well as hypertension.  Exam: Bartholin urethra Skene was within normal limits External genitalia inferior portion of the labia majora near the perineum there appeared to be a sebaceous cyst/folliculitis that had a white head to act that on minimum pressure began to extrude the sebum. The rest of the perineum and perirectal region was normal.  Assessment/plan: Sebaceous cyst of external genitalia patient to washed thoroughly with antibiotic sure also daily and apply Neosporin twice a day. Patient was reassured.

## 2013-08-21 NOTE — Patient Instructions (Signed)
Folliculitis  Folliculitis is redness, soreness, and swelling (inflammation) of the hair follicles. This condition can occur anywhere on the body. People with weakened immune systems, diabetes, or obesity have a greater risk of getting folliculitis. CAUSES  Bacterial infection. This is the most common cause.  Fungal infection.  Viral infection.  Contact with certain chemicals, especially oils and tars. Long-term folliculitis can result from bacteria that live in the nostrils. The bacteria may trigger multiple outbreaks of folliculitis over time. SYMPTOMS Folliculitis most commonly occurs on the scalp, thighs, legs, back, buttocks, and areas where hair is shaved frequently. An early sign of folliculitis is a small, white or yellow, pus-filled, itchy lesion (pustule). These lesions appear on a red, inflamed follicle. They are usually less than 0.2 inches (5 mm) wide. When there is an infection of the follicle that goes deeper, it becomes a boil or furuncle. A group of closely packed boils creates a larger lesion (carbuncle). Carbuncles tend to occur in hairy, sweaty areas of the body. DIAGNOSIS  Your caregiver can usually tell what is wrong by doing a physical exam. A sample may be taken from one of the lesions and tested in a lab. This can help determine what is causing your folliculitis. TREATMENT  Treatment may include:  Applying warm compresses to the affected areas.  Taking antibiotic medicines orally or applying them to the skin.  Draining the lesions if they contain a large amount of pus or fluid.  Laser hair removal for cases of long-lasting folliculitis. This helps to prevent regrowth of the hair. HOME CARE INSTRUCTIONS  Apply warm compresses to the affected areas as directed by your caregiver.  If antibiotics are prescribed, take them as directed. Finish them even if you start to feel better.  You may take over-the-counter medicines to relieve itching.  Do not shave  irritated skin.  Follow up with your caregiver as directed. SEEK IMMEDIATE MEDICAL CARE IF:   You have increasing redness, swelling, or pain in the affected area.  You have a fever. MAKE SURE YOU:  Understand these instructions.  Will watch your condition.  Will get help right away if you are not doing well or get worse. Document Released: 10/17/2001 Document Revised: 02/07/2012 Document Reviewed: 11/08/2011 ExitCare Patient Information 2014 [employer].  

## 2013-09-10 ENCOUNTER — Ambulatory Visit (INDEPENDENT_AMBULATORY_CARE_PROVIDER_SITE_OTHER): Payer: BC Managed Care – PPO | Admitting: General Surgery

## 2013-09-10 ENCOUNTER — Encounter (INDEPENDENT_AMBULATORY_CARE_PROVIDER_SITE_OTHER): Payer: Self-pay | Admitting: General Surgery

## 2013-09-10 VITALS — BP 134/86 | HR 68 | Temp 98.0°F | Resp 18 | Ht 65.0 in | Wt 296.0 lb

## 2013-09-10 DIAGNOSIS — K432 Incisional hernia without obstruction or gangrene: Secondary | ICD-10-CM

## 2013-09-10 NOTE — Patient Instructions (Signed)
Ventral Hernia A ventral hernia (also called an incisional hernia) is a hernia that occurs at the site of a previous surgical cut (incision) in the abdomen. The abdominal wall spans from your lower chest down to your pelvis. If the abdominal wall is weakened from a surgical incision, a hernia can occur. A hernia is a bulge of bowel or muscle tissue pushing out on the weakened part of the abdominal wall. Ventral hernias can get bigger from straining or lifting. Obese and older people are at higher risk for a ventral hernia. People who develop infections after surgery or require repeat incisions at the same site on the abdomen are also at increased risk. CAUSES  A ventral hernia occurs because of weakness in the abdominal wall at an incision site.  SYMPTOMS  Common symptoms include:  A visible bulge or lump on the abdominal wall.  Pain or tenderness around the lump.  Increased discomfort if you cough or make a sudden movement. If the hernia has blocked part of the intestine, a serious complication can occur (incarcerated or strangulated hernia). This can become a problem that requires emergency surgery because the blood flow to the blocked intestine may be cut off. Symptoms may include:  Feeling sick to your stomach (nauseous).  Throwing up (vomiting).  Stomach swelling (distention) or bloating.  Fever.  Rapid heartbeat. DIAGNOSIS  Your caregiver will take a medical history and perform a physical exam. Various tests may be ordered, such as:  Blood tests.  Urine tests.  Ultrasonography.  X-rays.  Computed tomography (CT). TREATMENT  Watchful waiting may be all that is needed for a smaller hernia that does not cause symptoms. Your caregiver may recommend the use of a supportive belt (truss) that helps to keep the abdominal wall intact. For larger hernias or those that cause pain, surgery to repair the hernia is usually recommended. If a hernia becomes strangulated, emergency surgery  needs to be done right away. HOME CARE INSTRUCTIONS  Avoid putting pressure or strain on the abdominal area.  Avoid heavy lifting.  Use good body positioning for physical tasks. Ask your caregiver about proper body positioning.  Use a supportive belt as directed by your caregiver.  Maintain a healthy weight.  Eat foods that are high in fiber, such as whole grains, fruits, and vegetables. Fiber helps prevent difficult bowel movements (constipation).  Drink enough fluids to keep your urine clear or pale yellow.  Follow up with your caregiver as directed. SEEK MEDICAL CARE IF:   Your hernia seems to be getting larger or more painful. SEEK IMMEDIATE MEDICAL CARE IF:   You have abdominal pain that is sudden and sharp.  Your pain becomes severe.  You have repeated vomiting.  You are sweating a lot.  You notice a rapid heartbeat.  You develop a fever. MAKE SURE YOU:   Understand these instructions.  Will watch your condition.  Will get help right away if you are not doing well or get worse. Document Released: 07/25/2012 Document Reviewed: 07/25/2012 Exit[employer] Patient Information 2014 [employer].  

## 2013-09-10 NOTE — Progress Notes (Signed)
Subjective:     Patient ID: [name], female   DOB: 1951-06-23, 63 y.o.   MRN: [id number]  HPI 82 yof previously seen by Dr. Rise Patience in our practice for incisional hernia at site of open chole in remote past. She was below 240 pounds when I saw her in 2012. She has some vague abdominal pain that may be related to her hernia. She is having some more back pain now and thinks it may be hernia related.  Does not notice any difference in hernia anteriorly.  No change in bms.  Has had right knee asc since I last saw her also.  She has also gained over 50 pounds and is not exercising. Pain is always relieved either by lying down or with tylenol She does have a signficant medical history including CHF , asthma, HTN, DM that she is now on medication for. She states that she is doing well overall now. She does use CPAP nightly for her OSA.   Review of Systems     Objective:   Physical Exam ruq incision well healed, soft nontender, obese difficult to identify hernia due to obesity    Assessment:     Incisional hernia     Plan:     I'm not sure that her symptoms are related to her hernia right now. She has gained over 50 pounds since I last saw her. I think the best plan for her would be to resume an exercise program and we discussed a water program. We discussed the risks of surgery now as well as a recurrence. I think it would be better to wait and have her lose some weight prior to considering a repair. I think her chances of a recurrence or morbidity during repair is much greater than her chance of needing urgent operation which she understands.

## 2013-09-12 ENCOUNTER — Ambulatory Visit (INDEPENDENT_AMBULATORY_CARE_PROVIDER_SITE_OTHER): Payer: BC Managed Care – PPO

## 2013-09-12 ENCOUNTER — Other Ambulatory Visit: Payer: Self-pay | Admitting: Gynecology

## 2013-09-12 DIAGNOSIS — Z780 Asymptomatic menopausal state: Secondary | ICD-10-CM

## 2013-09-12 DIAGNOSIS — N951 Menopausal and female climacteric states: Secondary | ICD-10-CM

## 2013-09-20 ENCOUNTER — Ambulatory Visit (INDEPENDENT_AMBULATORY_CARE_PROVIDER_SITE_OTHER): Payer: BC Managed Care – PPO | Admitting: Gynecology

## 2013-09-20 ENCOUNTER — Encounter: Payer: Self-pay | Admitting: Gynecology

## 2013-09-20 DIAGNOSIS — B3731 Acute candidiasis of vulva and vagina: Secondary | ICD-10-CM

## 2013-09-20 DIAGNOSIS — L293 Anogenital pruritus, unspecified: Secondary | ICD-10-CM | POA: Diagnosis not present

## 2013-09-20 DIAGNOSIS — B373 Candidiasis of vulva and vagina: Secondary | ICD-10-CM

## 2013-09-20 DIAGNOSIS — B9689 Other specified bacterial agents as the cause of diseases classified elsewhere: Secondary | ICD-10-CM

## 2013-09-20 DIAGNOSIS — N898 Other specified noninflammatory disorders of vagina: Secondary | ICD-10-CM

## 2013-09-20 DIAGNOSIS — A499 Bacterial infection, unspecified: Secondary | ICD-10-CM

## 2013-09-20 DIAGNOSIS — N760 Acute vaginitis: Secondary | ICD-10-CM

## 2013-09-20 LAB — WET PREP FOR TRICH, YEAST, CLUE: TRICH WET PREP: NONE SEEN

## 2013-09-20 MED ORDER — FLUCONAZOLE 150 MG PO TABS: ORAL_TABLET | ORAL | Status: DC

## 2013-09-20 MED ORDER — METRONIDAZOLE 500 MG PO TABS: 500.0000 mg | ORAL_TABLET | Freq: Two times a day (BID) | ORAL | Status: DC

## 2013-09-20 NOTE — Patient Instructions (Signed)
Metronidazole tablets or capsules What is this medicine? METRONIDAZOLE (me troe NI da zole) is an antiinfective. It is used to treat certain kinds of bacterial and protozoal infections. It will not work for colds, flu, or other viral infections. This medicine may be used for other purposes; ask your health care provider or pharmacist if you have questions. COMMON BRAND NAME(S): Flagyl What should I tell my health care provider before I take this medicine? They need to know if you have any of these conditions: -anemia or other blood disorders -disease of the nervous system -fungal or yeast infection -if you drink alcohol containing drinks -liver disease -seizures -an unusual or allergic reaction to metronidazole, or other medicines, foods, dyes, or preservatives -pregnant or trying to get pregnant -breast-feeding How should I use this medicine? Take this medicine by mouth with a full glass of water. Follow the directions on the prescription label. Take your medicine at regular intervals. Do not take your medicine more often than directed. Take all of your medicine as directed even if you think you are better. Do not skip doses or stop your medicine early. Talk to your pediatrician regarding the use of this medicine in children. Special care may be needed. Overdosage: If you think you have taken too much of this medicine contact a poison control center or emergency room at once. NOTE: This medicine is only for you. Do not share this medicine with others. What if I miss a dose? If you miss a dose, take it as soon as you can. If it is almost time for your next dose, take only that dose. Do not take double or extra doses. What may interact with this medicine? Do not take this medicine with any of the following medications: -alcohol or any product that contains alcohol -amprenavir oral solution -cisapride -disulfiram -dofetilide -dronedarone -paclitaxel injection -pimozide -ritonavir oral  solution -sertraline oral solution -sulfamethoxazole-trimethoprim injection -thioridazine -ziprasidone This medicine may also interact with the following medications: -cimetidine -lithium -other medicines that prolong the QT interval (cause an abnormal heart rhythm) -phenobarbital -phenytoin -warfarin This list may not describe all possible interactions. Give your health care provider a list of all the medicines, herbs, non-prescription drugs, or dietary supplements you use. Also tell them if you smoke, drink alcohol, or use illegal drugs. Some items may interact with your medicine. What should I watch for while using this medicine? Tell your doctor or health care professional if your symptoms do not improve or if they get worse. You may get drowsy or dizzy. Do not drive, use machinery, or do anything that needs mental alertness until you know how this medicine affects you. Do not stand or sit up quickly, especially if you are an older patient. This reduces the risk of dizzy or fainting spells. Avoid alcoholic drinks while you are taking this medicine and for three days afterward. Alcohol may make you feel dizzy, sick, or flushed. If you are being treated for a sexually transmitted disease, avoid sexual contact until you have finished your treatment. Your sexual partner may also need treatment. What side effects may I notice from receiving this medicine? Side effects that you should report to your doctor or health care professional as soon as possible: -allergic reactions like skin rash or hives, swelling of the face, lips, or tongue -confusion, clumsiness -difficulty speaking -discolored or sore mouth -dizziness -fever, infection -numbness, tingling, pain or weakness in the hands or feet -trouble passing urine or change in the amount of urine -redness, blistering,  peeling or loosening of the skin, including inside the mouth -seizures -unusually weak or tired -vaginal irritation,  dryness, or discharge Side effects that usually do not require medical attention (report to your doctor or health care professional if they continue or are bothersome): -diarrhea -headache -irritability -metallic taste -nausea -stomach pain or cramps -trouble sleeping This list may not describe all possible side effects. Call your doctor for medical advice about side effects. You may report side effects to FDA at 1-800-FDA-1088. Where should I keep my medicine? Keep out of the reach of children. Store at room temperature below 25 degrees C (77 degrees F). Protect from light. Keep container tightly closed. Throw away any unused medicine after the expiration date. NOTE: This sheet is a summary. It may not cover all possible information. If you have questions about this medicine, talk to your doctor, pharmacist, or health care provider.  2014, Elsevier/Gold Standard. (2013-02-05 14:08:26) Fluconazole tablets What is this medicine? FLUCONAZOLE (floo KON na zole) is an antifungal medicine. It is used to treat certain kinds of fungal or yeast infections. This medicine may be used for other purposes; ask your health care provider or pharmacist if you have questions. COMMON BRAND NAME(S): Diflucan What should I tell my health care provider before I take this medicine? They need to know if you have any of these conditions: -electrolyte abnormalities -history of irregular heart beat -kidney disease -an unusual or allergic reaction to fluconazole, other azole antifungals, medicines, foods, dyes, or preservatives -pregnant or trying to get pregnant -breast-feeding How should I use this medicine? Take this medicine by mouth. Follow the directions on the prescription label. Do not take your medicine more often than directed. Talk to your pediatrician regarding the use of this medicine in children. Special care may be needed. This medicine has been used in children as young as 6 months of  age. Overdosage: If you think you have taken too much of this medicine contact a poison control center or emergency room at once. NOTE: This medicine is only for you. Do not share this medicine with others. What if I miss a dose? If you miss a dose, take it as soon as you can. If it is almost time for your next dose, take only that dose. Do not take double or extra doses. What may interact with this medicine? Do not take this medicine with any of the following medications: -astemizole -certain medicines for irregular heart beat like dofetilide, dronedarone, quinidine -cisapride -erythromycin -lomitapide -other medicines that prolong the QT interval (cause an abnormal heart rhythm) -pimozide -terfenadine -thioridazine -tolvaptan -ziprasidone  This medicine may also interact with the following medications: -antiviral medicines for HIV or AIDS -birth control pills -certain antibiotics like rifabutin, rifampin -certain medicines for blood pressure like amlodipine, isradipine, felodipine, hydrochlorothiazide, losartan, nifedipine -certain medicines for cancer like cyclophosphamide, vinblastine, vincristine -certain medicines for cholesterol like atorvastatin, lovastatin, fluvastatin, simvastatin -certain medicines for depression, anxiety, or psychotic disturbances like amitriptyline, midazolam, nortriptyline, triazolam -certain medicines for diabetes like glipizide, glyburide, tolbutamide -certain medicines for pain like alfentanil, fentanyl, methadone -certain medicines for seizures like carbamazepine, phenytoin -certain medicines that treat or prevent blood clots like warfarin -halofantrine -medicines that lower your chance of fighting infection like cyclosporine, prednisone, tacrolimus -NSAIDS, medicines for pain and inflammation, like celecoxib, diclofenac, flurbiprofen, ibuprofen, meloxicam, naproxen -other medicines for fungal  infections -sirolimus -theophylline -tofacitinib This list may not describe all possible interactions. Give your health care provider a list of all the medicines, herbs, non-prescription drugs,  or dietary supplements you use. Also tell them if you smoke, drink alcohol, or use illegal drugs. Some items may interact with your medicine. What should I watch for while using this medicine? Visit your doctor or health care professional for regular checkups. If you are taking this medicine for a long time you may need blood work. Tell your doctor if your symptoms do not improve. Some fungal infections need many weeks or months of treatment to cure. Alcohol can increase possible damage to your liver. Avoid alcoholic drinks. If you have a vaginal infection, do not have sex until you have finished your treatment. You can wear a sanitary napkin. Do not use tampons. Wear freshly washed cotton, not synthetic, panties. What side effects may I notice from receiving this medicine? Side effects that you should report to your doctor or health care professional as soon as possible: -allergic reactions like skin rash or itching, hives, swelling of the lips, mouth, tongue, or throat -dark urine -feeling dizzy or faint -irregular heartbeat or chest pain -redness, blistering, peeling or loosening of the skin, including inside the mouth -trouble breathing -unusual bruising or bleeding -vomiting -yellowing of the eyes or skin  Side effects that usually do not require medical attention (report to your doctor or health care professional if they continue or are bothersome): -changes in how food tastes -diarrhea -headache -stomach upset or nausea This list may not describe all possible side effects. Call your doctor for medical advice about side effects. You may report side effects to FDA at 1-800-FDA-1088. Where should I keep my medicine? Keep out of the reach of children. Store at room temperature below 30 degrees C  (86 degrees F). Throw away any medicine after the expiration date. NOTE: This sheet is a summary. It may not cover all possible information. If you have questions about this medicine, talk to your doctor, pharmacist, or health care provider.  2014, Elsevier/Gold Standard. (2013-03-16 16:13:04) Bacterial Vaginosis Bacterial vaginosis is a vaginal infection that occurs when the normal balance of bacteria in the vagina is disrupted. It results from an overgrowth of certain bacteria. This is the most common vaginal infection in women of childbearing age. Treatment is important to prevent complications, especially in pregnant women, as it can cause a premature delivery. CAUSES  Bacterial vaginosis is caused by an increase in harmful bacteria that are normally present in smaller amounts in the vagina. Several different kinds of bacteria can cause bacterial vaginosis. However, the reason that the condition develops is not fully understood. RISK FACTORS Certain activities or behaviors can put you at an increased risk of developing bacterial vaginosis, including:  Having a new sex partner or multiple sex partners.  Douching.  Using an intrauterine device (IUD) for contraception. Women do not get bacterial vaginosis from toilet seats, bedding, swimming pools, or contact with objects around them. SIGNS AND SYMPTOMS  Some women with bacterial vaginosis have no signs or symptoms. Common symptoms include:  Grey vaginal discharge.  A fishlike odor with discharge, especially after sexual intercourse.  Itching or burning of the vagina and vulva.  Burning or pain with urination. DIAGNOSIS  Your health care provider will take a medical history and examine the vagina for signs of bacterial vaginosis. A sample of vaginal fluid may be taken. Your health care provider will look at this sample under a microscope to check for bacteria and abnormal cells. A vaginal pH test may also be done.  TREATMENT  Bacterial  vaginosis may be treated with antibiotic  medicines. These may be given in the form of a pill or a vaginal cream. A second round of antibiotics may be prescribed if the condition comes back after treatment.  HOME CARE INSTRUCTIONS   Only take over-the-counter or prescription medicines as directed by your health care provider.  If antibiotic medicine was prescribed, take it as directed. Make sure you finish it even if you start to feel better.  Do not have sex until treatment is completed.  Tell all sexual partners that you have a vaginal infection. They should see their health care provider and be treated if they have problems, such as a mild rash or itching.  Practice safe sex by using condoms and only having one sex partner. SEEK MEDICAL CARE IF:   Your symptoms are not improving after 3 days of treatment.  You have increased discharge or pain.  You have a fever. MAKE SURE YOU:   Understand these instructions.  Will watch your condition.  Will get help right away if you are not doing well or get worse. FOR MORE INFORMATION  Centers for Disease Control and Prevention, Division of STD Prevention: [employer]raiser[employer]raud[employer] American Sexual Health Association (ASHA): www.ashastd.org  Document Released: 08/08/2005 Document Revised: 05/29/2013 Document Reviewed: 03/20/2013 [facility] Patient Information 2014 [place]. Monilial Vaginitis Vaginitis in a soreness, swelling and redness (inflammation) of the vagina and vulva. Monilial vaginitis is not a sexually transmitted infection. CAUSES  Yeast vaginitis is caused by yeast (candida) that is normally found in your vagina. With a yeast infection, the candida has overgrown in number to a point that upsets the chemical balance. SYMPTOMS   White, thick vaginal discharge.  Swelling, itching, redness and irritation of the vagina and possibly the lips of the vagina (vulva).  Burning or painful urination.  Painful intercourse. DIAGNOSIS   Things that may contribute to monilial vaginitis are:  Postmenopausal and virginal states.  Pregnancy.  Infections.  Being tired, sick or stressed, especially if you had monilial vaginitis in the past.  Diabetes. Good control will help lower the chance.  Birth control pills.  Tight fitting garments.  Using bubble bath, feminine sprays, douches or deodorant tampons.  Taking certain medications that kill germs (antibiotics).  Sporadic recurrence can occur if you become ill. TREATMENT  Your caregiver will give you medication.  There are several kinds of anti monilial vaginal creams and suppositories specific for monilial vaginitis. For recurrent yeast infections, use a suppository or cream in the vagina 2 times a week, or as directed.  Anti-monilial or steroid cream for the itching or irritation of the vulva may also be used. Get your caregiver's permission.  Painting the vagina with methylene blue solution may help if the monilial cream does not work.  Eating yogurt may help prevent monilial vaginitis. HOME CARE INSTRUCTIONS   Finish all medication as prescribed.  Do not have sex until treatment is completed or after your caregiver tells you it is okay.  Take warm sitz baths.  Do not douche.  Do not use tampons, especially scented ones.  Wear cotton underwear.  Avoid tight pants and panty hose.  Tell your sexual partner that you have a yeast infection. They should go to their caregiver if they have symptoms such as mild rash or itching.  Your sexual partner should be treated as well if your infection is difficult to eliminate.  Practice safer sex. Use condoms.  Some vaginal medications cause latex condoms to fail. Vaginal medications that harm condoms are:  Cleocin cream.  Butoconazole (  Femstat).  Terconazole (Terazol) vaginal suppository.  Miconazole (Monistat) (may be purchased over the counter). SEEK MEDICAL CARE IF:   You have a temperature by  mouth above 102 F (38.9 C).  The infection is getting worse after 2 days of treatment.  The infection is not getting better after 3 days of treatment.  You develop blisters in or around your vagina.  You develop vaginal bleeding, and it is not your menstrual period.  You have pain when you urinate.  You develop intestinal problems.  You have pain with sexual intercourse. Document Released: 05/18/2005 Document Revised: 10/31/2011 Document Reviewed: 01/30/2009 [facility] Patient Information 2014 [place], [place].

## 2013-09-20 NOTE — Progress Notes (Signed)
   Patient presented to the office today with a complaint of severe vulvar pruritus for the past 2 days. Patient is morbidly obese who is being treated by her PCP for hyperlipidemia as well as hypertension. Patient tried over-the-counter Monistat for 2 days with minimal improvement.  Exam: Bartholin urethra Skene glands evidence of vulvar erythema irritation noted and Monistat cream was noted on the external genitalia. Vaginal exam: Thick white discharge was noted Cervix: Same Bimanual exam not done Rectal exam: Not done  Wet prep few yeast, Pos Amine , few clue cells moderate WBC in many bacteria  Assessment/plan: #1 vaginal moniliasis will be treated with Diflucan 150 mg 1 by mouth daily. #2 bacterial vaginosis: Will be treated with Flagyl 500 mg one by mouth twice a day for 7 days.

## 2013-10-22 DIAGNOSIS — F39 Unspecified mood [affective] disorder: Secondary | ICD-10-CM | POA: Diagnosis not present

## 2013-10-22 DIAGNOSIS — IMO0001 Reserved for inherently not codable concepts without codable children: Secondary | ICD-10-CM | POA: Diagnosis not present

## 2013-10-22 DIAGNOSIS — E7800 Pure hypercholesterolemia, unspecified: Secondary | ICD-10-CM | POA: Diagnosis not present

## 2013-10-22 DIAGNOSIS — I10 Essential (primary) hypertension: Secondary | ICD-10-CM | POA: Diagnosis not present

## 2013-10-22 DIAGNOSIS — E039 Hypothyroidism, unspecified: Secondary | ICD-10-CM | POA: Diagnosis not present

## 2013-11-04 DIAGNOSIS — F319 Bipolar disorder, unspecified: Secondary | ICD-10-CM | POA: Diagnosis not present

## 2013-11-08 DIAGNOSIS — R3915 Urgency of urination: Secondary | ICD-10-CM | POA: Diagnosis not present

## 2013-11-12 ENCOUNTER — Encounter: Payer: Self-pay | Admitting: Gynecology

## 2013-11-12 ENCOUNTER — Ambulatory Visit (INDEPENDENT_AMBULATORY_CARE_PROVIDER_SITE_OTHER): Payer: BC Managed Care – PPO | Admitting: Gynecology

## 2013-11-12 DIAGNOSIS — B373 Candidiasis of vulva and vagina: Secondary | ICD-10-CM

## 2013-11-12 DIAGNOSIS — N390 Urinary tract infection, site not specified: Secondary | ICD-10-CM

## 2013-11-12 DIAGNOSIS — B3731 Acute candidiasis of vulva and vagina: Secondary | ICD-10-CM | POA: Diagnosis not present

## 2013-11-12 LAB — URINALYSIS W MICROSCOPIC + REFLEX CULTURE
Hgb urine dipstick: NEGATIVE
Ketones, ur: NEGATIVE mg/dL
LEUKOCYTES UA: NEGATIVE
Nitrite: NEGATIVE
PH: 5 (ref 5.0–8.0)
PROTEIN: NEGATIVE mg/dL
SPECIFIC GRAVITY, URINE: 1.015 (ref 1.005–1.030)
Urobilinogen, UA: 0.2 mg/dL (ref 0.0–1.0)

## 2013-11-12 LAB — WET PREP FOR TRICH, YEAST, CLUE
Clue Cells Wet Prep HPF POC: NONE SEEN
TRICH WET PREP: NONE SEEN

## 2013-11-12 MED ORDER — FLUCONAZOLE 150 MG PO TABS: ORAL_TABLET | ORAL | Status: DC

## 2013-11-12 NOTE — Patient Instructions (Signed)
Monilial Vaginitis  Vaginitis in a soreness, swelling and redness (inflammation) of the vagina and vulva. Monilial vaginitis is not a sexually transmitted infection.  CAUSES   Yeast vaginitis is caused by yeast (candida) that is normally found in your vagina. With a yeast infection, the candida has overgrown in number to a point that upsets the chemical balance.  SYMPTOMS   · White, thick vaginal discharge.  · Swelling, itching, redness and irritation of the vagina and possibly the lips of the vagina (vulva).  · Burning or painful urination.  · Painful intercourse.  DIAGNOSIS   Things that may contribute to monilial vaginitis are:  · Postmenopausal and virginal states.  · Pregnancy.  · Infections.  · Being tired, sick or stressed, especially if you had monilial vaginitis in the past.  · Diabetes. Good control will help lower the chance.  · Birth control pills.  · Tight fitting garments.  · Using bubble bath, feminine sprays, douches or deodorant tampons.  · Taking certain medications that kill germs (antibiotics).  · Sporadic recurrence can occur if you become ill.  TREATMENT   Your caregiver will give you medication.  · There are several kinds of anti monilial vaginal creams and suppositories specific for monilial vaginitis. For recurrent yeast infections, use a suppository or cream in the vagina 2 times a week, or as directed.  · Anti-monilial or steroid cream for the itching or irritation of the vulva may also be used. Get your caregiver's permission.  · Painting the vagina with methylene blue solution may help if the monilial cream does not work.  · Eating yogurt may help prevent monilial vaginitis.  HOME CARE INSTRUCTIONS   · Finish all medication as prescribed.  · Do not have sex until treatment is completed or after your caregiver tells you it is okay.  · Take warm sitz baths.  · Do not douche.  · Do not use tampons, especially scented ones.  · Wear cotton underwear.  · Avoid tight pants and panty  hose.  · Tell your sexual partner that you have a yeast infection. They should go to their caregiver if they have symptoms such as mild rash or itching.  · Your sexual partner should be treated as well if your infection is difficult to eliminate.  · Practice safer sex. Use condoms.  · Some vaginal medications cause latex condoms to fail. Vaginal medications that harm condoms are:  · Cleocin cream.  · Butoconazole (Femstat®).  · Terconazole (Terazol®) vaginal suppository.  · Miconazole (Monistat®) (may be purchased over the counter).  SEEK MEDICAL CARE IF:   · You have a temperature by mouth above 102° F (38.9° C).  · The infection is getting worse after 2 days of treatment.  · The infection is not getting better after 3 days of treatment.  · You develop blisters in or around your vagina.  · You develop vaginal bleeding, and it is not your menstrual period.  · You have pain when you urinate.  · You develop intestinal problems.  · You have pain with sexual intercourse.  Document Released: 05/18/2005 Document Revised: 10/31/2011 Document Reviewed: 01/30/2009  [employer]® Patient Information ©2014 [employer].

## 2013-11-12 NOTE — Addendum Note (Signed)
Addended by: Su Grand A on: 11/12/2013 04:51 PM   Modules accepted: Orders

## 2013-11-12 NOTE — Progress Notes (Signed)
   Patient presented to the office today complaining of slight vaginal irritation. She was treated for urinary tract infection at urgent care last week and finish treatment this past Sunday (approximately 2 days ago. She feels sometimes that she doesn't empty completely. She is a type II diabetic.  Exam: Bartholin urethra Skene was within normal limits vagina slight white discharge was noted The Assessment/plan: Vaginal yeast infection secondary to recent antibiotic for UTI. Urinalysis today completely negative but we will run a culture on it as well. Patient with no fever, chills, nausea, or vomiting. Patient with no back pain reported either. Patient stated followup in one year for her annual exam or when necessary.

## 2013-11-13 LAB — URINE CULTURE
COLONY COUNT: NO GROWTH
Organism ID, Bacteria: NO GROWTH

## 2013-11-19 DIAGNOSIS — I10 Essential (primary) hypertension: Secondary | ICD-10-CM | POA: Diagnosis not present

## 2013-11-21 DIAGNOSIS — F319 Bipolar disorder, unspecified: Secondary | ICD-10-CM | POA: Diagnosis not present

## 2013-11-29 ENCOUNTER — Ambulatory Visit: Payer: BC Managed Care – PPO | Admitting: Women's Health

## 2013-12-05 DIAGNOSIS — F319 Bipolar disorder, unspecified: Secondary | ICD-10-CM | POA: Diagnosis not present

## 2013-12-10 DIAGNOSIS — N949 Unspecified condition associated with female genital organs and menstrual cycle: Secondary | ICD-10-CM | POA: Diagnosis not present

## 2013-12-10 DIAGNOSIS — E119 Type 2 diabetes mellitus without complications: Secondary | ICD-10-CM | POA: Diagnosis not present

## 2013-12-10 DIAGNOSIS — R300 Dysuria: Secondary | ICD-10-CM | POA: Diagnosis not present

## 2013-12-18 ENCOUNTER — Encounter: Payer: Self-pay | Admitting: Women's Health

## 2013-12-18 ENCOUNTER — Ambulatory Visit (INDEPENDENT_AMBULATORY_CARE_PROVIDER_SITE_OTHER): Payer: BC Managed Care – PPO | Admitting: Women's Health

## 2013-12-18 DIAGNOSIS — B373 Candidiasis of vulva and vagina: Secondary | ICD-10-CM | POA: Diagnosis not present

## 2013-12-18 DIAGNOSIS — N898 Other specified noninflammatory disorders of vagina: Secondary | ICD-10-CM

## 2013-12-18 DIAGNOSIS — B3731 Acute candidiasis of vulva and vagina: Secondary | ICD-10-CM

## 2013-12-18 DIAGNOSIS — N899 Noninflammatory disorder of vagina, unspecified: Secondary | ICD-10-CM | POA: Diagnosis not present

## 2013-12-18 LAB — WET PREP FOR TRICH, YEAST, CLUE
Clue Cells Wet Prep HPF POC: NONE SEEN
Trich, Wet Prep: NONE SEEN

## 2013-12-18 MED ORDER — TERCONAZOLE 0.4 % VA CREA: 1.0000 | TOPICAL_CREAM | Freq: Every day | VAGINAL | Status: DC

## 2013-12-18 NOTE — Patient Instructions (Signed)
Monilial Vaginitis  Vaginitis in a soreness, swelling and redness (inflammation) of the vagina and vulva. Monilial vaginitis is not a sexually transmitted infection.  CAUSES   Yeast vaginitis is caused by yeast (candida) that is normally found in your vagina. With a yeast infection, the candida has overgrown in number to a point that upsets the chemical balance.  SYMPTOMS   · White, thick vaginal discharge.  · Swelling, itching, redness and irritation of the vagina and possibly the lips of the vagina (vulva).  · Burning or painful urination.  · Painful intercourse.  DIAGNOSIS   Things that may contribute to monilial vaginitis are:  · Postmenopausal and virginal states.  · Pregnancy.  · Infections.  · Being tired, sick or stressed, especially if you had monilial vaginitis in the past.  · Diabetes. Good control will help lower the chance.  · Birth control pills.  · Tight fitting garments.  · Using bubble bath, feminine sprays, douches or deodorant tampons.  · Taking certain medications that kill germs (antibiotics).  · Sporadic recurrence can occur if you become ill.  TREATMENT   Your caregiver will give you medication.  · There are several kinds of anti monilial vaginal creams and suppositories specific for monilial vaginitis. For recurrent yeast infections, use a suppository or cream in the vagina 2 times a week, or as directed.  · Anti-monilial or steroid cream for the itching or irritation of the vulva may also be used. Get your caregiver's permission.  · Painting the vagina with methylene blue solution may help if the monilial cream does not work.  · Eating yogurt may help prevent monilial vaginitis.  HOME CARE INSTRUCTIONS   · Finish all medication as prescribed.  · Do not have sex until treatment is completed or after your caregiver tells you it is okay.  · Take warm sitz baths.  · Do not douche.  · Do not use tampons, especially scented ones.  · Wear cotton underwear.  · Avoid tight pants and panty  hose.  · Tell your sexual partner that you have a yeast infection. They should go to their caregiver if they have symptoms such as mild rash or itching.  · Your sexual partner should be treated as well if your infection is difficult to eliminate.  · Practice safer sex. Use condoms.  · Some vaginal medications cause latex condoms to fail. Vaginal medications that harm condoms are:  · Cleocin cream.  · Butoconazole (Femstat®).  · Terconazole (Terazol®) vaginal suppository.  · Miconazole (Monistat®) (may be purchased over the counter).  SEEK MEDICAL CARE IF:   · You have a temperature by mouth above 102° F (38.9° C).  · The infection is getting worse after 2 days of treatment.  · The infection is not getting better after 3 days of treatment.  · You develop blisters in or around your vagina.  · You develop vaginal bleeding, and it is not your menstrual period.  · You have pain when you urinate.  · You develop intestinal problems.  · You have pain with sexual intercourse.  Document Released: 05/18/2005 Document Revised: 10/31/2011 Document Reviewed: 01/30/2009  [employer]® Patient Information ©2014 [employer].

## 2013-12-18 NOTE — Progress Notes (Signed)
Patient ID: [name], female   DOB: 1950-12-16, 63 y.o.   MRN: [id number] Presents with vaginal irritation and itching. Used over-the-counter Monistat with some relief. Has had problems with recurrent yeast infections. Denies vaginal discharge, urinary symptoms, abdominal pain, fever. Type II diabetic, hypertensive, hypercholesteremia, CHF, on numerous medications.  Exam: Obese. External genitalia erythematous, +2 cystocele, wet prep with Q-tip, positive for yeast.  Yeast vaginitis Uncontrolled diabetes Obesity  Plan: Reviewed importance of better control of diabetes in relationship to blood sugars and yeast infections. Terazol 7 one applicator at bedtime x7 and apply externally as needed, instructed to call if no relief of symptoms.

## 2013-12-19 DIAGNOSIS — F319 Bipolar disorder, unspecified: Secondary | ICD-10-CM | POA: Diagnosis not present

## 2013-12-20 ENCOUNTER — Telehealth: Payer: Self-pay | Admitting: *Deleted

## 2013-12-20 NOTE — Telephone Encounter (Signed)
Pt called asking if s/s of yeast would improve , taking Terazol 7 day cream nightly x 7. I explained to pt to continue taking medication, she will follow up as needed.

## 2013-12-27 DIAGNOSIS — K439 Ventral hernia without obstruction or gangrene: Secondary | ICD-10-CM | POA: Diagnosis not present

## 2013-12-27 DIAGNOSIS — L259 Unspecified contact dermatitis, unspecified cause: Secondary | ICD-10-CM | POA: Diagnosis not present

## 2014-01-01 ENCOUNTER — Other Ambulatory Visit: Payer: Self-pay | Admitting: Family Medicine

## 2014-01-01 DIAGNOSIS — R109 Unspecified abdominal pain: Secondary | ICD-10-CM

## 2014-01-01 DIAGNOSIS — R1011 Right upper quadrant pain: Secondary | ICD-10-CM | POA: Diagnosis not present

## 2014-01-03 ENCOUNTER — Ambulatory Visit
Admission: RE | Admit: 2014-01-03 | Discharge: 2014-01-03 | Disposition: A | Discharge status: Home / Self Care | Payer: Medicare Other | Source: Ambulatory Visit | Attending: Family Medicine | Admitting: Family Medicine

## 2014-01-03 DIAGNOSIS — R109 Unspecified abdominal pain: Secondary | ICD-10-CM

## 2014-01-08 ENCOUNTER — Encounter (HOSPITAL_COMMUNITY): Payer: Self-pay | Admitting: Emergency Medicine

## 2014-01-08 ENCOUNTER — Emergency Department (HOSPITAL_COMMUNITY)
Admission: EM | Admit: 2014-01-08 | Discharge: 2014-01-08 | Disposition: A | Discharge status: Home / Self Care | Payer: BC Managed Care – PPO | Attending: Emergency Medicine | Admitting: Emergency Medicine

## 2014-01-08 ENCOUNTER — Emergency Department (HOSPITAL_COMMUNITY): Payer: BC Managed Care – PPO

## 2014-01-08 DIAGNOSIS — G4730 Sleep apnea, unspecified: Secondary | ICD-10-CM | POA: Diagnosis not present

## 2014-01-08 DIAGNOSIS — I509 Heart failure, unspecified: Secondary | ICD-10-CM | POA: Diagnosis not present

## 2014-01-08 DIAGNOSIS — F411 Generalized anxiety disorder: Secondary | ICD-10-CM | POA: Diagnosis not present

## 2014-01-08 DIAGNOSIS — J45909 Unspecified asthma, uncomplicated: Secondary | ICD-10-CM | POA: Insufficient documentation

## 2014-01-08 DIAGNOSIS — E119 Type 2 diabetes mellitus without complications: Secondary | ICD-10-CM | POA: Diagnosis not present

## 2014-01-08 DIAGNOSIS — Z7982 Long term (current) use of aspirin: Secondary | ICD-10-CM | POA: Diagnosis not present

## 2014-01-08 DIAGNOSIS — E785 Hyperlipidemia, unspecified: Secondary | ICD-10-CM | POA: Insufficient documentation

## 2014-01-08 DIAGNOSIS — F319 Bipolar disorder, unspecified: Secondary | ICD-10-CM | POA: Insufficient documentation

## 2014-01-08 DIAGNOSIS — K469 Unspecified abdominal hernia without obstruction or gangrene: Secondary | ICD-10-CM | POA: Diagnosis not present

## 2014-01-08 DIAGNOSIS — I10 Essential (primary) hypertension: Secondary | ICD-10-CM | POA: Diagnosis not present

## 2014-01-08 DIAGNOSIS — K5900 Constipation, unspecified: Secondary | ICD-10-CM | POA: Diagnosis not present

## 2014-01-08 DIAGNOSIS — Z90710 Acquired absence of both cervix and uterus: Secondary | ICD-10-CM | POA: Insufficient documentation

## 2014-01-08 DIAGNOSIS — Z9089 Acquired absence of other organs: Secondary | ICD-10-CM | POA: Diagnosis not present

## 2014-01-08 DIAGNOSIS — Z79899 Other long term (current) drug therapy: Secondary | ICD-10-CM | POA: Diagnosis not present

## 2014-01-08 LAB — COMPREHENSIVE METABOLIC PANEL
ALBUMIN: 3.5 g/dL (ref 3.5–5.2)
ALT: 12 U/L (ref 0–35)
AST: 13 U/L (ref 0–37)
Alkaline Phosphatase: 114 U/L (ref 39–117)
BUN: 16 mg/dL (ref 6–23)
CALCIUM: 10.2 mg/dL (ref 8.4–10.5)
CO2: 27 meq/L (ref 19–32)
Chloride: 104 mEq/L (ref 96–112)
Creatinine, Ser: 0.78 mg/dL (ref 0.50–1.10)
GFR calc Af Amer: >90 mL/min (ref >90)
GFR calc non Af Amer: 88 mL/min — ABNORMAL LOW (ref >90)
Glucose, Bld: 191 mg/dL — ABNORMAL HIGH (ref 70–99)
Potassium: 4 mEq/L (ref 3.7–5.3)
SODIUM: 142 meq/L (ref 137–147)
TOTAL PROTEIN: 6.8 g/dL (ref 6.0–8.3)
Total Bilirubin: 0.4 mg/dL (ref 0.3–1.2)

## 2014-01-08 LAB — CBC WITH DIFFERENTIAL/PLATELET
Basophils Absolute: 0.1 10*3/uL (ref 0.0–0.1)
Basophils Relative: 1 % (ref 0–1)
EOS ABS: 0.2 10*3/uL (ref 0.0–0.7)
EOS PCT: 3 % (ref 0–5)
HCT: 37.1 % (ref 36.0–46.0)
HEMOGLOBIN: 12.5 g/dL (ref 12.0–15.0)
LYMPHS ABS: 1.8 10*3/uL (ref 0.7–4.0)
Lymphocytes Relative: 27 % (ref 12–46)
MCH: 30.4 pg (ref 26.0–34.0)
MCHC: 33.7 g/dL (ref 30.0–36.0)
MCV: 90.3 fL (ref 78.0–100.0)
MONOS PCT: 5 % (ref 3–12)
Monocytes Absolute: 0.4 10*3/uL (ref 0.1–1.0)
Neutro Abs: 4.3 10*3/uL (ref 1.7–7.7)
Neutrophils Relative %: 64 % (ref 43–77)
Platelets: 219 10*3/uL (ref 150–400)
RBC: 4.11 MIL/uL (ref 3.87–5.11)
RDW: 13.9 % (ref 11.5–15.5)
WBC: 6.7 10*3/uL (ref 4.0–10.5)

## 2014-01-08 LAB — URINALYSIS, ROUTINE W REFLEX MICROSCOPIC
Bilirubin Urine: NEGATIVE
GLUCOSE, UA: NEGATIVE mg/dL
Hgb urine dipstick: NEGATIVE
Ketones, ur: NEGATIVE mg/dL
Nitrite: NEGATIVE
PROTEIN: NEGATIVE mg/dL
Specific Gravity, Urine: 1.013 (ref 1.005–1.030)
UROBILINOGEN UA: 0.2 mg/dL (ref 0.0–1.0)
pH: 5.5 (ref 5.0–8.0)

## 2014-01-08 LAB — URINE MICROSCOPIC-ADD ON

## 2014-01-08 LAB — LIPASE, BLOOD: Lipase: 28 U/L (ref 11–59)

## 2014-01-08 MED ORDER — HYDROCODONE-ACETAMINOPHEN 5-325 MG PO TABS
1.0000 | ORAL_TABLET | Freq: Once | ORAL | Status: AC
  Administered 2014-01-08: 1 via ORAL
  Filled 2014-01-08: qty 1

## 2014-01-08 MED ORDER — ONDANSETRON HCL 4 MG PO TABS: 4.0000 mg | ORAL_TABLET | Freq: Four times a day (QID) | ORAL | Status: DC

## 2014-01-08 MED ORDER — FENTANYL CITRATE 0.05 MG/ML IJ SOLN: 50.0000 ug | Freq: Once | INTRAMUSCULAR | Status: DC

## 2014-01-08 MED ORDER — HYDROCODONE-ACETAMINOPHEN 5-325 MG PO TABS: 1.0000 | ORAL_TABLET | ORAL | Status: DC | PRN

## 2014-01-08 MED ORDER — SODIUM CHLORIDE 0.9 % IV SOLN
INTRAVENOUS | Status: DC
Start: 2014-01-08 — End: 2014-01-08

## 2014-01-08 MED ORDER — ONDANSETRON HCL 4 MG/2ML IJ SOLN: 4.0000 mg | Freq: Once | INTRAMUSCULAR | Status: DC

## 2014-01-08 NOTE — ED Notes (Signed)
Per PA pt is not wanting any IV medication at this time.

## 2014-01-08 NOTE — ED Provider Notes (Signed)
CSN: [id number]     Arrival date & time 01/08/14  0715 History   First MD Initiated Contact with Patient 01/08/14 [id number]     Chief Complaint  Patient presents with  . Flank Pain    right worse than left     (Consider location/radiation/quality/duration/timing/severity/associated sxs/prior Treatment) HPI  [name] is a 63 y.o.female with a significant PMH of asthma, diabetes, CHF, pericarditis, short term memory loss (per husband), anxiety, bipolar, morbid obesity, sleep apnea, depression, hypertension presents to the ER with complaints of abdominal pains that have been going on for weeks "maybe months". She see's her primary care doctor for this who feels that the pain may be coming from her hernia that she has in the RUQ. She had a cholecystectomy done years ago and developed hernia in this site. She is not a surgical candidate at this time for repair due to her obesity. She had a CT scan done last week to evaluate the hernia which showed no concerning abnormalities within the abdomen. Nothing this morning has changed with her symptoms. She tells me, "I'm always scared, im scared of everything and I've been looking on the Internet". She reports having Vicodin at home but does not want to take it because "i'm scare of pain medication". She has not had the quality or severity of her pain change. She has no associated symptoms of nausea, vomiting or diarrhea. No weakness, fevers, vaginal complaints, back pain, lower extremity swelling, SOB, CP, orthopnea.    Past Medical History  Diagnosis Date  . Arthritis   . Asthma   . Diabetes mellitus   . CHF (congestive heart failure)   . Hypertension   . Hyperlipidemia   . Pericarditis, viral 2010 October  . Depression   . Anxiety   . Bipolar 1 disorder   . Hernia   . Sleep apnea   . Complication of anesthesia     left a bad taste in my mouth it has lasted since january    Past Surgical History  Procedure Laterality Date  . Abdominal  hysterectomy    . Foot surgery      bone spurs   . Cholecystectomy  1984    open  . Appendectomy    . Knee arthroscopy  09/07/2012    Procedure: ARTHROSCOPY KNEE;  Surgeon: Newt Minion, MD;  Location: [facility];  Service: Orthopedics;  Laterality: Left;  Left Knee Arthroscopy   Family History  Problem Relation Age of Onset  . Diabetes Mother   . Hypertension Mother   . Hyperlipidemia Mother   . Diabetes Father   . Hypertension Father   . Hyperlipidemia Father   . Hypertension Sister    History  Substance Use Topics  . Smoking status: Never Smoker   . Smokeless tobacco: Never Used  . Alcohol Use: No   OB History   Grav Para Term Preterm Abortions TAB SAB Ect Mult Living   2 2        2      Review of Systems   Review of Systems  Gen: no weight loss, fevers, chills, night sweats  Eyes: no discharge or drainage, no occular pain or visual changes  Nose: no epistaxis or rhinorrhea  Mouth: no dental pain, no sore throat  Neck: no neck pain  Lungs:No wheezing, coughing or hemoptysis CV: no chest pain, palpitations, dependent edema or orthopnea  Abd: + abdominal pain, No nausea, vomiting, diarrhea GU: no dysuria or gross hematuria  MSK:  No muscle weakness or pain Neuro: no headache, no focal neurologic deficits  Skin: no rash or wounds Psyche: no complaints    Allergies  Celexa; Maxzide; Reglan; Risperdal; Trazodone and nefazodone; Zestril; and Lithium  Home Medications   Prior to Admission medications   Medication Sig Start Date End Date Taking? Authorizing Provider  ALPRAZolam (XANAX XR) 1 MG 24 hr tablet Take 1 mg by mouth 2 (two) times daily.   Yes Historical Provider, MD  aspirin 81 MG tablet Take 81 mg by mouth daily.     Yes Historical Provider, MD  Canagliflozin (INVOKANA) 100 MG TABS Take 100 mg by mouth daily.    Yes Historical Provider, MD  etodolac (LODINE) 400 MG tablet Take 400 mg by mouth daily.    Yes Historical Provider, MD  furosemide (LASIX) 20 MG  tablet Take 20 mg by mouth daily as needed (for severe swelling).    Yes Historical Provider, MD  lamoTRIgine (LAMICTAL) 150 MG tablet Take 225 mg by mouth 2 (two) times daily. 1 and 1/2 tablet BID   Yes Historical Provider, MD  levocetirizine (XYZAL) 5 MG tablet Take 5 mg by mouth every evening.   Yes Historical Provider, MD  levothyroxine (SYNTHROID, LEVOTHROID) 75 MCG tablet Take 75 mcg by mouth daily.   Yes Historical Provider, MD  lisinopril (PRINIVIL,ZESTRIL) 20 MG tablet Take 20 mg by mouth daily.  08/18/13  Yes Historical Provider, MD  QUEtiapine (SEROQUEL) 400 MG tablet Take 400 mg by mouth 2 (two) times daily.   Yes Historical Provider, MD  simvastatin (ZOCOR) 40 MG tablet Take 40 mg by mouth at bedtime.     Yes Historical Provider, MD  zolpidem (AMBIEN) 10 MG tablet Take 10 mg by mouth at bedtime as needed.     Yes Historical Provider, MD  potassium chloride SA (K-DUR,KLOR-CON) 20 MEQ tablet Take 20 mEq by mouth daily.    Historical Provider, MD   BP 119/68  Pulse 85  Temp(Src) 98.3 F (36.8 C) (Oral)  Resp 18  Ht 5\' 5"  (1.651 m)  Wt 282 lb (127.914 kg)  BMI 46.93 kg/m2  SpO2 97% Physical Exam  Nursing note and vitals reviewed. Constitutional: She appears well-developed and well-nourished. No distress.  Morbidly obese  HENT:  Head: Normocephalic and atraumatic.  Eyes: Pupils are equal, round, and reactive to light.  Neck: Normal range of motion. Neck supple.  Cardiovascular: Normal rate and regular rhythm.   Pulmonary/Chest: Effort normal.  Abdominal: Soft.  Exam limited to body habitus. Mild diffuse tenderness to entire abdomen.  Neurological: She is alert.  Skin: Skin is warm and dry.    ED Course  Procedures (including critical care time) Labs Review Labs Reviewed  URINALYSIS, ROUTINE W REFLEX MICROSCOPIC - Abnormal; Notable for the following:    Leukocytes, UA SMALL (*)    All other components within normal limits  COMPREHENSIVE METABOLIC PANEL - Abnormal;  Notable for the following:    Glucose, Bld 191 (*)    GFR calc non Af Amer 88 (*)    All other components within normal limits  LIPASE, BLOOD  CBC WITH DIFFERENTIAL  URINE MICROSCOPIC-ADD ON    Imaging Review Dg Abd Acute W/chest  01/08/2014   CLINICAL DATA:  Right flank pain  EXAM: ACUTE ABDOMEN SERIES (ABDOMEN 2 VIEW & CHEST 1 VIEW)  COMPARISON:  CT ABD/PELV WO CM dated 01/03/2014  FINDINGS: The chest is clear. There is stable cardiomegaly. . The osseous structures are unremarkable.  There is no  bowel dilatation to suggest obstruction. There is a gastric air-fluid level. There is a moderate amount of stool in the ascending colon. There is oral contrast material from recent CT of the abdomen in the descending colon and sigmoid colon. There is no evidence of pneumoperitoneum, portal venous gas, or pneumatosis.  Osseous structures are unremarkable.  IMPRESSION: 1. No acute cardiopulmonary disease.  Stable cardiomegaly. 2. Gastric air-fluid level which may reflect gastroenteritis versus recently ingested fluid. There is no evidence of bowel obstruction. 3. Moderate amount of stool in the ascending colon.   Electronically Signed   By: Kathreen Devoid   On: 01/08/2014 08:50     EKG Interpretation None      MDM   Final diagnoses:  Abdominal hernia    Patient has been having chronic abdominal pains determined to be caused by her RUQ hernia. Her surgeon has said they won't repair it until she looses enough weight to fix it. She saw her PCP last week for her discomfort and he got a CT scan of the abdomen which showed that the hernia was not incarcerated, no significant findings. She says that they gave her Vicodin which she has not been taking because she is scared. She came today because of the pain but says this is the same pain she has been having and nothing is different. Her husband had her come because "he was tired of listening to me complain". As well as urinalysis, CMP, lipase, CBC. Acute abd  w/chest ordered    Since the patient's pain has not changed, her vital signs are all within normal limits and her blood work is unremarkable I do not feel that it would be prudent to perform another CT scan of the abdomen and pelvis since she just had a normal one last week. I did give her a dose of by mouth Vicodin in the ER which she said did help her pain.  She has been given a referral to a gastroenterologist that she can followup with. Give her a prescription for couple more Vicodin and Zofran for her to take at home. Asked her to call surgeon and let him know that she still hurting. I encouraged patient to watch her diet and start trying to exercise to lose weight so that she can have the surgery.  62 y.o.[name]'s evaluation in the Emergency Department is complete. It has been determined that no acute conditions requiring further emergency intervention are present at this time. The patient/guardian have been advised of the diagnosis and plan. We have discussed signs and symptoms that warrant return to the ED, such as changes or worsening in symptoms.  Vital signs are stable at discharge. Filed Vitals:   01/08/14 1013  BP: 119/68  Pulse: 85  Temp: 98.3 F (36.8 C)  Resp: 18    Patient/guardian has voiced understanding and agreed to follow-up with the PCP or specialist.     Linus Mako, PA-C 01/08/14 1413

## 2014-01-08 NOTE — Discharge Instructions (Signed)
Hernia A hernia occurs when an internal organ pushes out through a weak spot in the abdominal wall. Hernias most commonly occur in the groin and around the navel. Hernias often can be pushed back into place (reduced). Most hernias tend to get worse over time. Some abdominal hernias can get stuck in the opening (irreducible or incarcerated hernia) and cannot be reduced. An irreducible abdominal hernia which is tightly squeezed into the opening is at risk for impaired blood supply (strangulated hernia). A strangulated hernia is a medical emergency. Because of the risk for an irreducible or strangulated hernia, surgery may be recommended to repair a hernia. CAUSES   Heavy lifting.  Prolonged coughing.  Straining to have a bowel movement.  A cut (incision) made during an abdominal surgery. HOME CARE INSTRUCTIONS   Bed rest is not required. You may continue your normal activities.  Avoid lifting more than 10 pounds (4.5 kg) or straining.  Cough gently. If you are a smoker it is best to stop. Even the best hernia repair can break down with the continual strain of coughing. Even if you do not have your hernia repaired, a cough will continue to aggravate the problem.  Do not wear anything tight over your hernia. Do not try to keep it in with an outside bandage or truss. These can damage abdominal contents if they are trapped within the hernia sac.  Eat a normal diet.  Avoid constipation. Straining over long periods of time will increase hernia size and encourage breakdown of repairs. If you cannot do this with diet alone, stool softeners may be used. SEEK IMMEDIATE MEDICAL CARE IF:   You have a fever.  You develop increasing abdominal pain.  You feel nauseous or vomit.  Your hernia is stuck outside the abdomen, looks discolored, feels hard, or is tender.  You have any changes in your bowel habits or in the hernia that are unusual for you.  You have increased pain or swelling around the  hernia.  You cannot push the hernia back in place by applying gentle pressure while lying down. MAKE SURE YOU:  Understand these instructions.Abdominal Pain, Women Abdominal (stomach, pelvic, or belly) pain can be caused by many things. It is important to tell your doctor: The location of the pain. Does it come and go or is it present all the time? Are there things that start the pain (eating certain foods, exercise)? Are there other symptoms associated with the pain (fever, nausea, vomiting, diarrhea)? All of this is helpful to know when trying to find the cause of the pain. CAUSES  Stomach: virus or bacteria infection, or ulcer. Intestine: appendicitis (inflamed appendix), regional ileitis (Crohn's disease), ulcerative colitis (inflamed colon), irritable bowel syndrome, diverticulitis (inflamed diverticulum of the colon), or cancer of the stomach or intestine. Gallbladder disease or stones in the gallbladder. Kidney disease, kidney stones, or infection. Pancreas infection or cancer. Fibromyalgia (pain disorder). Diseases of the female organs: Uterus: fibroid (non-cancerous) tumors or infection. Fallopian tubes: infection or tubal pregnancy. Ovary: cysts or tumors. Pelvic adhesions (scar tissue). Endometriosis (uterus lining tissue growing in the pelvis and on the pelvic organs). Pelvic congestion syndrome (female organs filling up with blood just before the menstrual period). Pain with the menstrual period. Pain with ovulation (producing an egg). Pain with an IUD (intrauterine device, birth control) in the uterus. Cancer of the female organs. Functional pain (pain not caused by a disease, may improve without treatment). Psychological pain. Depression. DIAGNOSIS  Your doctor will decide the seriousness  of your pain by doing an examination. Blood tests. X-rays. Ultrasound. CT scan (computed tomography, special type of X-ray). MRI (magnetic resonance imaging). Cultures, for  infection. Barium enema (dye inserted in the large intestine, to better view it with X-rays). Colonoscopy (looking in intestine with a lighted tube). Laparoscopy (minor surgery, looking in abdomen with a lighted tube). Major abdominal exploratory surgery (looking in abdomen with a large incision). TREATMENT  The treatment will depend on the cause of the pain.  Many cases can be observed and treated at home. Over-the-counter medicines recommended by your caregiver. Prescription medicine. Antibiotics, for infection. Birth control pills, for painful periods or for ovulation pain. Hormone treatment, for endometriosis. Nerve blocking injections. Physical therapy. Antidepressants. Counseling with a psychologist or psychiatrist. Minor or major surgery. HOME CARE INSTRUCTIONS  Do not take laxatives, unless directed by your caregiver. Take over-the-counter pain medicine only if ordered by your caregiver. Do not take aspirin because it can cause an upset stomach or bleeding. Try a clear liquid diet (broth or water) as ordered by your caregiver. Slowly move to a bland diet, as tolerated, if the pain is related to the stomach or intestine. Have a thermometer and take your temperature several times a day, and record it. Bed rest and sleep, if it helps the pain. Avoid sexual intercourse, if it causes pain. Avoid stressful situations. Keep your follow-up appointments and tests, as your caregiver orders. If the pain does not go away with medicine or surgery, you may try: Acupuncture. Relaxation exercises (yoga, meditation). Group therapy. Counseling. SEEK MEDICAL CARE IF:  You notice certain foods cause stomach pain. Your home care treatment is not helping your pain. You need stronger pain medicine. You want your IUD removed. You feel faint or lightheaded. You develop nausea and vomiting. You develop a rash. You are having side effects or an allergy to your medicine. SEEK IMMEDIATE MEDICAL  CARE IF:  Your pain does not go away or gets worse. You have a fever. Your pain is felt only in portions of the abdomen. The right side could possibly be appendicitis. The left lower portion of the abdomen could be colitis or diverticulitis. You are passing blood in your stools (bright red or black tarry stools, with or without vomiting). You have blood in your urine. You develop chills, with or without a fever. You pass out. MAKE SURE YOU:  Understand these instructions. Will watch your condition. Will get help right away if you are not doing well or get worse. Document Released: 06/05/2007 Document Revised: 10/31/2011 Document Reviewed: 06/25/2009 [facility] Patient Information 2014 [place], [place].    Will watch your condition.  Will get help right away if you are not doing well or get worse. Document Released: 08/08/2005 Document Revised: 10/31/2011 Document Reviewed: 03/27/2008 [facility] Patient Information 2014 [place].

## 2014-01-08 NOTE — ED Notes (Signed)
63 yo with c/o right and left sided flank pain for over a weeks time. Reports that pain gets worse and worse and is not relieved by tylenol and ibuprophen. Last week pt had CT scan for hernia on the RLQ. Denies N/V/D. Reports frequent yeast infections. HX of DM, CHF.

## 2014-01-10 DIAGNOSIS — R1011 Right upper quadrant pain: Secondary | ICD-10-CM | POA: Diagnosis not present

## 2014-01-11 NOTE — ED Provider Notes (Signed)
Medical screening examination/treatment/procedure(s) were performed by non-physician practitioner and as supervising physician I was immediately available for consultation/collaboration.   EKG Interpretation None        Fredia Sorrow, MD 01/11/14 1807

## 2014-01-24 DIAGNOSIS — F319 Bipolar disorder, unspecified: Secondary | ICD-10-CM | POA: Diagnosis not present

## 2014-01-28 ENCOUNTER — Ambulatory Visit (INDEPENDENT_AMBULATORY_CARE_PROVIDER_SITE_OTHER): Payer: BC Managed Care – PPO | Admitting: General Surgery

## 2014-01-28 ENCOUNTER — Encounter (INDEPENDENT_AMBULATORY_CARE_PROVIDER_SITE_OTHER): Payer: Self-pay | Admitting: General Surgery

## 2014-01-28 VITALS — BP 140/80 | HR 82 | Resp 18 | Ht 65.0 in | Wt 288.0 lb

## 2014-01-28 DIAGNOSIS — K432 Incisional hernia without obstruction or gangrene: Secondary | ICD-10-CM

## 2014-01-28 NOTE — Progress Notes (Signed)
Subjective:     Patient ID: [name], female   DOB: 18-Nov-1950, 63 y.o.   MRN: [id number]  HPI 47 yof previously seen by Dr. Rise Patience in our practice for incisional hernia at site of open chole in remote past. Her weight is still elevated and is down 8 lbs since last visit at 288 lbs.  She has recently been seen in the er for some right sided, mid abdominal pain but also with back pain.  This is better lying down.  It is worse standing up.  She does think there is a bulge present on her right side.  She is eating well and without complaint today.    Review of Systems EXAM:  CT ABDOMEN AND PELVIS WITHOUT CONTRAST  TECHNIQUE:  Multidetector CT imaging of the abdomen and pelvis was performed  following the standard protocol without IV contrast.  COMPARISON: 11/15/2012  FINDINGS:  Linear subsegmental atelectasis or scarring in the left lung base.  Right lung bases clear. Heart is normal size.  Prior cholecystectomy. Spleen, pancreas, adrenals and kidneys have  an unremarkable unenhanced appearance. Diffuse low density  throughout the liver compatible with fatty infiltration. No visible  focal abnormality.  Descending colonic and sigmoid diverticulosis. No active  diverticulitis. Small bowel is decompressed. No evidence of  diverticulitis.  No abdominal wall of defects noted.  Prior hysterectomy. No adnexal masses. No acute bony abnormality or  focal bone lesion. Degenerative changes in the lower lumbar spine.  IMPRESSION:  No evidence of a abdominal wall hernia.  Mild diffuse fatty infiltration of the liver.  Prior cholecystectomy.  Left colonic diverticulosis.     Objective:   Physical Exam  Constitutional: She appears well-developed and well-nourished.  Abdominal: Bowel sounds are normal. She exhibits no distension. There is tenderness in the right upper quadrant. A hernia is present. Hernia confirmed positive in the ventral area.         Assessment:     Likely  incisional hernia     Plan:     I do think clinically she may very well have a hernia although her last couple of CT scans both differ. I can't exactly see one on this last CT scan either. She does appear to have a hernia present clinically though although she is a difficult exam due to habitus. I'm not sure that this is the source of all of her pain quite honestly. I think due to the fact that her weight does remain elevated even considering a diagnostic laparoscopy with possible repair is out of the question right now also. I think recurrence at this weight is too high and I would rather manage this conservatively still. She understands this. I recommended an abdominal binder again her prescription for that today. I asked her to call me back.

## 2014-02-12 ENCOUNTER — Telehealth (INDEPENDENT_AMBULATORY_CARE_PROVIDER_SITE_OTHER): Payer: Self-pay

## 2014-02-12 NOTE — Telephone Encounter (Signed)
Melissa with Dr Kathline Magic office was calling to inform Dr Donne Hazel that pt has canceled her colonoscopy that was scheduled with them on 03/11/14. Informed Melissa that I would send Dr Donne Hazel a message to have him aware of this.

## 2014-02-15 ENCOUNTER — Encounter (HOSPITAL_COMMUNITY): Payer: Self-pay | Admitting: Emergency Medicine

## 2014-02-15 DIAGNOSIS — Z8719 Personal history of other diseases of the digestive system: Secondary | ICD-10-CM | POA: Diagnosis not present

## 2014-02-15 DIAGNOSIS — I509 Heart failure, unspecified: Secondary | ICD-10-CM | POA: Diagnosis not present

## 2014-02-15 DIAGNOSIS — J45909 Unspecified asthma, uncomplicated: Secondary | ICD-10-CM | POA: Insufficient documentation

## 2014-02-15 DIAGNOSIS — I10 Essential (primary) hypertension: Secondary | ICD-10-CM | POA: Diagnosis not present

## 2014-02-15 DIAGNOSIS — F411 Generalized anxiety disorder: Secondary | ICD-10-CM | POA: Diagnosis not present

## 2014-02-15 DIAGNOSIS — Z9089 Acquired absence of other organs: Secondary | ICD-10-CM | POA: Insufficient documentation

## 2014-02-15 DIAGNOSIS — E782 Mixed hyperlipidemia: Secondary | ICD-10-CM | POA: Diagnosis not present

## 2014-02-15 DIAGNOSIS — R109 Unspecified abdominal pain: Secondary | ICD-10-CM | POA: Diagnosis present

## 2014-02-15 DIAGNOSIS — F319 Bipolar disorder, unspecified: Secondary | ICD-10-CM | POA: Insufficient documentation

## 2014-02-15 DIAGNOSIS — Z90710 Acquired absence of both cervix and uterus: Secondary | ICD-10-CM | POA: Insufficient documentation

## 2014-02-15 DIAGNOSIS — E669 Obesity, unspecified: Secondary | ICD-10-CM | POA: Insufficient documentation

## 2014-02-15 DIAGNOSIS — E119 Type 2 diabetes mellitus without complications: Secondary | ICD-10-CM | POA: Diagnosis not present

## 2014-02-15 DIAGNOSIS — Z79899 Other long term (current) drug therapy: Secondary | ICD-10-CM | POA: Insufficient documentation

## 2014-02-15 DIAGNOSIS — M129 Arthropathy, unspecified: Secondary | ICD-10-CM | POA: Insufficient documentation

## 2014-02-15 DIAGNOSIS — R1013 Epigastric pain: Secondary | ICD-10-CM | POA: Insufficient documentation

## 2014-02-15 LAB — CBC WITH DIFFERENTIAL/PLATELET
BASOS ABS: 0.1 10*3/uL (ref 0.0–0.1)
Basophils Relative: 1 % (ref 0–1)
EOS ABS: 0.2 10*3/uL (ref 0.0–0.7)
EOS PCT: 3 % (ref 0–5)
HCT: 37.7 % (ref 36.0–46.0)
Hemoglobin: 12.6 g/dL (ref 12.0–15.0)
LYMPHS ABS: 2.4 10*3/uL (ref 0.7–4.0)
Lymphocytes Relative: 30 % (ref 12–46)
MCH: 29.9 pg (ref 26.0–34.0)
MCHC: 33.4 g/dL (ref 30.0–36.0)
MCV: 89.5 fL (ref 78.0–100.0)
Monocytes Absolute: 0.5 10*3/uL (ref 0.1–1.0)
Monocytes Relative: 7 % (ref 3–12)
NEUTROS PCT: 59 % (ref 43–77)
Neutro Abs: 4.9 10*3/uL (ref 1.7–7.7)
PLATELETS: 302 10*3/uL (ref 150–400)
RBC: 4.21 MIL/uL (ref 3.87–5.11)
RDW: 13.7 % (ref 11.5–15.5)
WBC: 8.2 10*3/uL (ref 4.0–10.5)

## 2014-02-15 LAB — COMPREHENSIVE METABOLIC PANEL
ALT: 17 U/L (ref 0–35)
AST: 22 U/L (ref 0–37)
Albumin: 3.7 g/dL (ref 3.5–5.2)
Alkaline Phosphatase: 102 U/L (ref 39–117)
BUN: 16 mg/dL (ref 6–23)
CO2: 25 meq/L (ref 19–32)
CREATININE: 0.72 mg/dL (ref 0.50–1.10)
Calcium: 10.2 mg/dL (ref 8.4–10.5)
Chloride: 101 mEq/L (ref 96–112)
GFR calc Af Amer: >90 mL/min (ref >90)
Glucose, Bld: 161 mg/dL — ABNORMAL HIGH (ref 70–99)
POTASSIUM: 4.3 meq/L (ref 3.7–5.3)
Sodium: 142 mEq/L (ref 137–147)
Total Bilirubin: 0.4 mg/dL (ref 0.3–1.2)
Total Protein: 7.5 g/dL (ref 6.0–8.3)

## 2014-02-15 LAB — LIPASE, BLOOD: Lipase: 29 U/L (ref 11–59)

## 2014-02-15 NOTE — ED Notes (Signed)
C/o rt upper abd. Hernia pain. The surgeon will not operate r/t ? Pt. Weight. Pt. Taking hydrocodone but not helping. Suppose to see gi dr. Delilah [name] thurs. For further evaluation.

## 2014-02-16 ENCOUNTER — Emergency Department (HOSPITAL_COMMUNITY)
Admission: EM | Admit: 2014-02-16 | Discharge: 2014-02-16 | Disposition: A | Discharge status: Home / Self Care | Payer: BC Managed Care – PPO | Attending: Emergency Medicine | Admitting: Emergency Medicine

## 2014-02-16 ENCOUNTER — Encounter (HOSPITAL_COMMUNITY): Payer: Self-pay | Admitting: Emergency Medicine

## 2014-02-16 DIAGNOSIS — M129 Arthropathy, unspecified: Secondary | ICD-10-CM | POA: Insufficient documentation

## 2014-02-16 DIAGNOSIS — Z791 Long term (current) use of non-steroidal anti-inflammatories (NSAID): Secondary | ICD-10-CM | POA: Insufficient documentation

## 2014-02-16 DIAGNOSIS — G8929 Other chronic pain: Secondary | ICD-10-CM | POA: Diagnosis not present

## 2014-02-16 DIAGNOSIS — F3289 Other specified depressive episodes: Secondary | ICD-10-CM | POA: Insufficient documentation

## 2014-02-16 DIAGNOSIS — E119 Type 2 diabetes mellitus without complications: Secondary | ICD-10-CM | POA: Insufficient documentation

## 2014-02-16 DIAGNOSIS — F411 Generalized anxiety disorder: Secondary | ICD-10-CM | POA: Insufficient documentation

## 2014-02-16 DIAGNOSIS — E785 Hyperlipidemia, unspecified: Secondary | ICD-10-CM | POA: Insufficient documentation

## 2014-02-16 DIAGNOSIS — R112 Nausea with vomiting, unspecified: Secondary | ICD-10-CM | POA: Diagnosis not present

## 2014-02-16 DIAGNOSIS — R109 Unspecified abdominal pain: Secondary | ICD-10-CM

## 2014-02-16 DIAGNOSIS — I10 Essential (primary) hypertension: Secondary | ICD-10-CM | POA: Diagnosis not present

## 2014-02-16 DIAGNOSIS — R1013 Epigastric pain: Secondary | ICD-10-CM

## 2014-02-16 DIAGNOSIS — R1011 Right upper quadrant pain: Secondary | ICD-10-CM | POA: Diagnosis not present

## 2014-02-16 DIAGNOSIS — F329 Major depressive disorder, single episode, unspecified: Secondary | ICD-10-CM | POA: Insufficient documentation

## 2014-02-16 DIAGNOSIS — F319 Bipolar disorder, unspecified: Secondary | ICD-10-CM | POA: Insufficient documentation

## 2014-02-16 DIAGNOSIS — R1110 Vomiting, unspecified: Secondary | ICD-10-CM | POA: Diagnosis present

## 2014-02-16 DIAGNOSIS — J45909 Unspecified asthma, uncomplicated: Secondary | ICD-10-CM | POA: Insufficient documentation

## 2014-02-16 DIAGNOSIS — Z79899 Other long term (current) drug therapy: Secondary | ICD-10-CM | POA: Insufficient documentation

## 2014-02-16 DIAGNOSIS — E669 Obesity, unspecified: Secondary | ICD-10-CM

## 2014-02-16 DIAGNOSIS — I509 Heart failure, unspecified: Secondary | ICD-10-CM | POA: Insufficient documentation

## 2014-02-16 DIAGNOSIS — E138 Other specified diabetes mellitus with unspecified complications: Secondary | ICD-10-CM

## 2014-02-16 LAB — BASIC METABOLIC PANEL
BUN: 15 mg/dL (ref 6–23)
CALCIUM: 10.2 mg/dL (ref 8.4–10.5)
CO2: 23 mEq/L (ref 19–32)
CREATININE: 0.65 mg/dL (ref 0.50–1.10)
Chloride: 101 mEq/L (ref 96–112)
Glucose, Bld: 202 mg/dL — ABNORMAL HIGH (ref 70–99)
Potassium: 4.4 mEq/L (ref 3.7–5.3)
SODIUM: 141 meq/L (ref 137–147)

## 2014-02-16 MED ORDER — SODIUM CHLORIDE 0.9 % IV BOLUS (SEPSIS)
500.0000 mL | Freq: Once | INTRAVENOUS | Status: AC
  Administered 2014-02-16: 500 mL via INTRAVENOUS

## 2014-02-16 MED ORDER — ONDANSETRON 4 MG PO TBDP
8.0000 mg | ORAL_TABLET | Freq: Once | ORAL | Status: AC
  Administered 2014-02-16: 8 mg via ORAL
  Filled 2014-02-16: qty 2

## 2014-02-16 MED ORDER — PROMETHAZINE HCL 25 MG/ML IJ SOLN
12.5000 mg | Freq: Once | INTRAMUSCULAR | Status: DC
  Filled 2014-02-16: qty 1

## 2014-02-16 MED ORDER — DOCUSATE SODIUM 100 MG PO CAPS: 100.0000 mg | ORAL_CAPSULE | Freq: Two times a day (BID) | ORAL | Status: DC

## 2014-02-16 MED ORDER — HYDROMORPHONE HCL PF 1 MG/ML IJ SOLN
0.5000 mg | Freq: Once | INTRAMUSCULAR | Status: AC
  Administered 2014-02-16: 0.5 mg via INTRAVENOUS
  Filled 2014-02-16: qty 1

## 2014-02-16 MED ORDER — ONDANSETRON 8 MG/NS 50 ML IVPB
8.0000 mg | Freq: Once | INTRAVENOUS | Status: AC
  Administered 2014-02-16: 8 mg via INTRAVENOUS
  Filled 2014-02-16: qty 8

## 2014-02-16 MED ORDER — ESOMEPRAZOLE MAGNESIUM 40 MG PO CPDR: 40.0000 mg | DELAYED_RELEASE_CAPSULE | Freq: Every day | ORAL | Status: DC

## 2014-02-16 MED ORDER — PROMETHAZINE HCL 25 MG PO TABS: 25.0000 mg | ORAL_TABLET | Freq: Three times a day (TID) | ORAL | Status: DC | PRN

## 2014-02-16 MED ORDER — PROMETHAZINE HCL 25 MG/ML IJ SOLN
12.5000 mg | Freq: Once | INTRAMUSCULAR | Status: AC
  Administered 2014-02-16: 12.5 mg via INTRAVENOUS
  Filled 2014-02-16 (×2): qty 1

## 2014-02-16 NOTE — ED Provider Notes (Addendum)
CSN: [id number]     Arrival date & time 02/15/14  2155 History   First MD Initiated Contact with Patient 02/16/14 0109     Chief Complaint  Patient presents with  . Hernia  . Abdominal Pain     (Consider location/radiation/quality/duration/timing/severity/associated sxs/prior Treatment) HPI This patient is an obese 63 -year-old diabetic woman who is obese, has hypertension, hyperlipidemia, chronic depression and anxiety. She also has a ventral hernia which is then evaluated by Dr. Donne Hazel for surgical repair. He has declined to operate in light of the patient's morbid obesity.  The patient says that hernia causes her pain. She has been taking Vicodin for the pain. However, she says the Vicodin is no longer working. Thus, she comes back to the emergency department asking for another surgery consultation regarding the hernia.   The patient says her pain is worse after po intake. However, she had been having normal BM's - most recently this morning. No vomiting. NO fever. She has an appt with GI in 4 days.   Pain is aching, cramping, currently she is pain free. Pain comes on after po intake and is moderately severe at its worst and radiates from the midline to the RUQ.    Past Medical History  Diagnosis Date  . Arthritis   . Asthma   . Diabetes mellitus   . CHF (congestive heart failure)   . Hypertension   . Hyperlipidemia   . Pericarditis, viral 2010 October  . Depression   . Anxiety   . Bipolar 1 disorder   . Hernia   . Sleep apnea   . Complication of anesthesia     left a bad taste in my mouth it has lasted since january    Past Surgical History  Procedure Laterality Date  . Abdominal hysterectomy    . Foot surgery      bone spurs   . Cholecystectomy  1984    open  . Appendectomy    . Knee arthroscopy  09/07/2012    Procedure: ARTHROSCOPY KNEE;  Surgeon: Newt Minion, MD;  Location: [place];  Service: Orthopedics;  Laterality: Left;  Left Knee Arthroscopy   Family  History  Problem Relation Age of Onset  . Diabetes Mother   . Hypertension Mother   . Hyperlipidemia Mother   . Diabetes Father   . Hypertension Father   . Hyperlipidemia Father   . Hypertension Sister    History  Substance Use Topics  . Smoking status: Never Smoker   . Smokeless tobacco: Never Used  . Alcohol Use: No   OB History   Grav Para Term Preterm Abortions TAB SAB Ect Mult Living   2 2        2      Review of Systems Ten point review of symptoms performed and is negative with the exception of symptoms noted above.     Allergies  Celexa; Maxzide; Reglan; Risperdal; Trazodone and nefazodone; Zestril; and Lithium  Home Medications   Prior to Admission medications   Medication Sig Start Date End Date Taking? Authorizing Kianna Billet  ALPRAZolam (XANAX XR) 1 MG 24 hr tablet Take 1 mg by mouth 2 (two) times daily.   Yes Historical Keyah Blizard, MD  aspirin 81 MG tablet Take 81 mg by mouth daily.     Yes Historical Kellianne Ek, MD  furosemide (LASIX) 20 MG tablet Take 20 mg by mouth daily as needed (for severe swelling).    Yes Historical Mickala Laton, MD  HYDROcodone-acetaminophen (NORCO/VICODIN) 5-325 MG per  tablet Take 1-2 tablets by mouth every 4 (four) hours as needed. 01/08/14  Yes Tiffany Marilu Favre, PA-C  lamoTRIgine (LAMICTAL) 150 MG tablet Take 225 mg by mouth 2 (two) times daily. 1 and 1/2 tablet BID   Yes Historical Eveny Anastas, MD  levocetirizine (XYZAL) 5 MG tablet Take 5 mg by mouth every evening.   Yes Historical Markea Ruzich, MD  levothyroxine (SYNTHROID, LEVOTHROID) 75 MCG tablet Take 75 mcg by mouth daily.   Yes Historical Brittyn Salaz, MD  lisinopril (PRINIVIL,ZESTRIL) 20 MG tablet Take 20 mg by mouth daily.  08/18/13  Yes Historical Morena Mckissack, MD  potassium chloride SA (K-DUR,KLOR-CON) 20 MEQ tablet Take 20 mEq by mouth daily.   Yes Historical Seichi Kaufhold, MD  QUEtiapine (SEROQUEL) 400 MG tablet Take 400 mg by mouth 2 (two) times daily.   Yes Historical Mayerly Kaman, MD  zolpidem (AMBIEN)  10 MG tablet Take 10 mg by mouth at bedtime as needed.     Yes Historical Zaley Talley, MD  simvastatin (ZOCOR) 40 MG tablet Take 40 mg by mouth at bedtime.      Historical Norrin Shreffler, MD   BP 149/81  Pulse 95  Temp(Src) 97.5 F (36.4 C) (Oral)  Resp 20  Wt 278 lb (126.1 kg)  SpO2 95% Physical Exam Gen: well developed and well nourished appearing Head: NCAT Eyes: PERL, EOMI Nose: no epistaixis or rhinorrhea Mouth/throat: mucosa is moist and pink Neck: supple, no stridor Lungs: CTA B, no wheezing, rhonchi or rales CV: RRR, no murmur, extremities appear well perfused.  Abd: soft,obese, no ventral hernia is appreciate,  notender, nondistended Back: no ttp, no cva ttp Skin: warm and dry Ext: normal to inspection, no dependent edema Neuro: CN ii-xii grossly intact, no focal deficits Psyche; normal affect,  calm and cooperative.   ED Course  Procedures (including critical care time) Labs Revie  Results for orders placed during the hospital encounter of 02/16/14 (from the past 24 hour(s))  COMPREHENSIVE METABOLIC PANEL     Status: Abnormal   Collection Time    02/15/14 10:06 PM      Result Value Ref Range   Sodium 142  137 - 147 mEq/L   Potassium 4.3  3.7 - 5.3 mEq/L   Chloride 101  96 - 112 mEq/L   CO2 25  19 - 32 mEq/L   Glucose, Bld 161 (*) 70 - 99 mg/dL   BUN 16  6 - 23 mg/dL   Creatinine, Ser 0.72  0.50 - 1.10 mg/dL   Calcium 10.2  8.4 - 10.5 mg/dL   Total Protein 7.5  6.0 - 8.3 g/dL   Albumin 3.7  3.5 - 5.2 g/dL   AST 22  0 - 37 U/L   ALT 17  0 - 35 U/L   Alkaline Phosphatase 102  39 - 117 U/L   Total Bilirubin 0.4  0.3 - 1.2 mg/dL   GFR calc non Af Amer >90  >90 mL/min   GFR calc Af Amer >90  >90 mL/min  CBC WITH DIFFERENTIAL     Status: None   Collection Time    02/15/14 10:06 PM      Result Value Ref Range   WBC 8.2  4.0 - 10.5 K/uL   RBC 4.21  3.87 - 5.11 MIL/uL   Hemoglobin 12.6  12.0 - 15.0 g/dL   HCT 37.7  36.0 - 46.0 %   MCV 89.5  78.0 - 100.0 fL   MCH  29.9  26.0 - 34.0 pg   MCHC 33.4  30.0 - 36.0 g/dL   RDW 13.7  11.5 - 15.5 %   Platelets 302  150 - 400 K/uL   Neutrophils Relative % 59  43 - 77 %   Neutro Abs 4.9  1.7 - 7.7 K/uL   Lymphocytes Relative 30  12 - 46 %   Lymphs Abs 2.4  0.7 - 4.0 K/uL   Monocytes Relative 7  3 - 12 %   Monocytes Absolute 0.5  0.1 - 1.0 K/uL   Eosinophils Relative 3  0 - 5 %   Eosinophils Absolute 0.2  0.0 - 0.7 K/uL   Basophils Relative 1  0 - 1 %   Basophils Absolute 0.1  0.0 - 0.1 K/uL  LIPASE, BLOOD     Status: None   Collection Time    02/15/14 10:06 PM      Result Value Ref Range   Lipase 29  11 - 59 U/L     MDM   Patient with chronic epigastric pain which is likely not secondary to chronic ventral or incisional hernia. She is currently prescribed Vicodin 7.5 325. I have suggested that she take 2 tabs rather than 1 every 4 hrs for more aggressive pain management and f/u with GI, as scheduled, this week. No indication for ED surgical consultation. Patient will f/u with [facility].     Elyn Peers, MD 02/16/14 [phone]  We will add PPI for treatment of pre and post pradial epigastric pain which may be secondary to PUD, gastritis or GERD.   Elyn Peers, MD 02/16/14 [phone]

## 2014-02-16 NOTE — ED Provider Notes (Signed)
CSN: [id number]     Arrival date & time 02/16/14  1437 History   First MD Initiated Contact with Patient 02/16/14 1631     Chief Complaint  Patient presents with  . Emesis   HPI Comments: Patient is a 63 y.o. Female who returns to the [facility] ED with continued nausea and vomiting.  Patient was seen this morning for abdominal pain by Dr. Cheri Guppy.  She was discharged home and was told to take 2 of her hydrocodone for pain management, take a PPI and to follow-up with her appointment with GI in 4 days with Dr. Cari Caraway. Patient is convinced that this pain is due to her ventral hernia from a cholecystectomy.  She is followed by Dr. Donne Hazel who will not operate and does not seem convinced that this abdominal pain is actually coming from her hernia.  She was prescribed an abdominal binder which she states that she could not tolerate today.  Patient has been seen multiple times here in the ED for similar abdominal pain.  Patient recently had a CT scan of the abdomen on 01/08/14 which showed mild diffuse fatty infiltration of the liver, prior cholecystectomy, and some diverticulitis.  There was no appreciable hernia on the scan at that time.     Patient is a 63 y.o. female presenting with vomiting. The history is provided by the patient. No language interpreter was used.  Emesis Associated symptoms: abdominal pain   Associated symptoms: no chills and no diarrhea     Past Medical History  Diagnosis Date  . Arthritis   . Asthma   . Diabetes mellitus   . CHF (congestive heart failure)   . Hypertension   . Hyperlipidemia   . Pericarditis, viral 2010 October  . Depression   . Anxiety   . Bipolar 1 disorder   . Hernia   . Sleep apnea   . Complication of anesthesia     left a bad taste in my mouth it has lasted since january    Past Surgical History  Procedure Laterality Date  . Abdominal hysterectomy    . Foot surgery      bone spurs   . Cholecystectomy  1984    open  . Appendectomy    . Knee  arthroscopy  09/07/2012    Procedure: ARTHROSCOPY KNEE;  Surgeon: Newt Minion, MD;  Location: [place];  Service: Orthopedics;  Laterality: Left;  Left Knee Arthroscopy   Family History  Problem Relation Age of Onset  . Diabetes Mother   . Hypertension Mother   . Hyperlipidemia Mother   . Diabetes Father   . Hypertension Father   . Hyperlipidemia Father   . Hypertension Sister    History  Substance Use Topics  . Smoking status: Never Smoker   . Smokeless tobacco: Never Used  . Alcohol Use: No   OB History   Grav Para Term Preterm Abortions TAB SAB Ect Mult Living   2 2        2      Review of Systems  Constitutional: Negative for fever, chills and fatigue.  Respiratory: Negative for chest tightness, shortness of breath and wheezing.   Cardiovascular: Negative for chest pain and palpitations.  Gastrointestinal: Positive for nausea, vomiting and abdominal pain. Negative for diarrhea, constipation and blood in stool.       RUQ abdominal pain  Genitourinary: Negative for dysuria, urgency, frequency, hematuria and difficulty urinating.  Musculoskeletal: Negative for back pain and neck pain.  All other systems  reviewed and are negative.     Allergies  Celexa; Maxzide; Reglan; Risperdal; Trazodone and nefazodone; Zestril; and Lithium  Home Medications   Prior to Admission medications   Medication Sig Start Date End Date Taking? Authorizing Provider  ALPRAZolam (XANAX XR) 1 MG 24 hr tablet Take 1 mg by mouth daily at 6 PM.    Yes Historical Provider, MD  aspirin 81 MG tablet Take 81 mg by mouth daily.     Yes Historical Provider, MD  esomeprazole (NEXIUM) 40 MG capsule Take 1 capsule (40 mg total) by mouth daily. 02/16/14  Yes Elyn Peers, MD  etodolac (LODINE) 400 MG tablet Take 400 mg by mouth 2 (two) times daily. 12/01/13  Yes Historical Provider, MD  furosemide (LASIX) 20 MG tablet Take 20 mg by mouth daily as needed (for severe swelling).    Yes Historical Provider, MD    HYDROcodone-acetaminophen (NORCO) 7.5-325 MG per tablet Take 1 tablet by mouth every 6 (six) hours as needed for moderate pain.   Yes Historical Provider, MD  lamoTRIgine (LAMICTAL) 150 MG tablet Take 75-150 mg by mouth 2 (two) times daily. Take 75mg  in the morning and take 150mg  at night   Yes Historical Provider, MD  levocetirizine (XYZAL) 5 MG tablet Take 5 mg by mouth every evening.   Yes Historical Provider, MD  levothyroxine (SYNTHROID, LEVOTHROID) 75 MCG tablet Take 75 mcg by mouth daily.   Yes Historical Provider, MD  lisinopril (PRINIVIL,ZESTRIL) 20 MG tablet Take 20 mg by mouth daily.  08/18/13  Yes Historical Provider, MD  ondansetron (ZOFRAN) 4 MG tablet Take 4 mg by mouth daily. 01/08/14  Yes Historical Provider, MD  ONE TOUCH ULTRA TEST test strip 1 each by Other route 3 (three) times daily. For blood sugar 11/18/13  Yes Historical Provider, MD  polyethylene glycol (MIRALAX / GLYCOLAX) packet Take 17 g by mouth 2 (two) times daily as needed for mild constipation.   Yes Historical Provider, MD  potassium chloride SA (K-DUR,KLOR-CON) 20 MEQ tablet Take 20 mEq by mouth daily.   Yes Historical Provider, MD  QUEtiapine (SEROQUEL) 400 MG tablet Take 800 mg by mouth daily at 6 PM.    Yes Historical Provider, MD  simvastatin (ZOCOR) 40 MG tablet Take 40 mg by mouth at bedtime.     Yes Historical Provider, MD  zolpidem (AMBIEN) 10 MG tablet Take 10 mg by mouth at bedtime as needed.     Yes Historical Provider, MD   BP 167/82  Pulse 81  Temp(Src) 97.9 F (36.6 C) (Oral)  Resp 20  SpO2 96% Physical Exam  Nursing note and vitals reviewed. Constitutional: She is oriented to person, place, and time. She appears well-developed and well-nourished. No distress.  Morbidly obese  HENT:  Head: Normocephalic and atraumatic.  Mouth/Throat: No oropharyngeal exudate.  Eyes: Conjunctivae and EOM are normal. Pupils are equal, round, and reactive to light.  Neck: Normal range of motion. Neck supple. No  JVD present. No thyromegaly present.  Cardiovascular: Normal rate, regular rhythm, normal heart sounds and intact distal pulses.  Exam reveals no gallop and no friction rub.   No murmur heard. Pulmonary/Chest: Effort normal and breath sounds normal.  Abdominal: Soft. Bowel sounds are normal. She exhibits no distension and no mass. There is generalized tenderness. There is no rigidity, no rebound, no guarding, no CVA tenderness, no tenderness at McBurney's point and negative Murphy's sign. No hernia.    Patient complains of mild generalized tenderness to palpation, but does not  wince.  There is no appreciable hernia or hernial sack.    Lymphadenopathy:    She has no cervical adenopathy.  Neurological: She is alert and oriented to person, place, and time.  Skin: Skin is warm and dry. No rash noted. She is not diaphoretic. No erythema. No pallor.  Psychiatric: She has a normal mood and affect. Her behavior is normal. Judgment and thought content normal.    ED Course  Procedures (including critical care time) Labs Review Labs Reviewed  BASIC METABOLIC PANEL - Abnormal; Notable for the following:    Glucose, Bld 202 (*)    All other components within normal limits    Imaging Review No results found.   EKG Interpretation None      MDM   Final diagnoses:  Non-intractable vomiting with nausea, vomiting of unspecified type  Chronic abdominal pain   Patient returns to the ED with nausea and vomiting.  Patient was seen by Dr. Cheri Guppy this morning and had CBC, CMP, lipase at that time which did not show any acute abnormalities.  I have repeated a BMP here with no evidence of electrolyte derangement.  Patient was treated with fluid bolus and with IV zofran and phenergan with relief of nausea and emesis.  I have given her 0.5 mg of dilaudid here for pain relief.  Nausea may be due to increase in hydrocodone use.  Zofran seemed to be ineffective at this time at home.  Will discontinue and try  oral phenergan.  There was no hernia appreciable on exam to me.  I have reassured the patient that this pain is likely not due to her hernia.  Patient was told to follow-up with her scheduled appointment with GI on Thursday.  She states understanding at this time.  I see no need to perform any further imaging at this time as she recently had a CT scan in 01/03/14. I have told her to continue taking the PPI that Dr. Cheri Guppy prescribed this morning.  Patient is tolerating PO here in the ED.  She was told to return for fever, blood in her stool, dark tarry stools, or intractable abdominal pain.  I have discussed this patient with Dr. Wilson Singer and he agrees with the above plan.    Kenard Gower, PA-C 02/16/14 2005  Kenard Gower, PA-C 02/16/14 2039

## 2014-02-16 NOTE — ED Notes (Addendum)
Pt was here this am for same. Reports having abd pain, n/v, hot flashes/chills, dizziness. Pt was dc home and told to take meds and 2 vicodin rather than just 1, pt followed directions and is now pain free but unable to keep any fluids down. Hx of hernia that has not yet been repaired. cbg 150 pta.

## 2014-02-16 NOTE — ED Notes (Signed)
PA Courtney at the bedside.

## 2014-02-16 NOTE — Discharge Instructions (Signed)
Discontinue your zofran and take Phenergan as you need it for nausea and vomiting.  Try to take only one hydrocodone for your pain.   You can take up to two but this may increase your nausea.  Follow-up with Dr. Nelida Meuse for your scheduled appointment.  Make sure you take your daily pantoprazole.    Abdominal Pain Many things can cause abdominal pain. Usually, abdominal pain is not caused by a disease and will improve without treatment. It can often be observed and treated at home. Your health care provider will do a physical exam and possibly order blood tests and X-rays to help determine the seriousness of your pain. However, in many cases, more time must pass before a clear cause of the pain can be found. Before that point, your health care provider may not know if you need more testing or further treatment. HOME CARE INSTRUCTIONS  Monitor your abdominal pain for any changes. The following actions may help to alleviate any discomfort you are experiencing:  Only take over-the-counter or prescription medicines as directed by your health care provider.  Do not take laxatives unless directed to do so by your health care provider.  Try a clear liquid diet (broth, tea, or water) as directed by your health care provider. Slowly move to a bland diet as tolerated. SEEK MEDICAL CARE IF:  You have unexplained abdominal pain.  You have abdominal pain associated with nausea or diarrhea.  You have pain when you urinate or have a bowel movement.  You experience abdominal pain that wakes you in the night.  You have abdominal pain that is worsened or improved by eating food.  You have abdominal pain that is worsened with eating fatty foods.  You have a fever. SEEK IMMEDIATE MEDICAL CARE IF:   Your pain does not go away within 2 hours.  You keep throwing up (vomiting).  Your pain is felt only in portions of the abdomen, such as the right side or the left lower portion of the abdomen.  You pass  bloody or black tarry stools. MAKE SURE YOU:  Understand these instructions.   Will watch your condition.   Will get help right away if you are not doing well or get worse.  Document Released: 05/18/2005 Document Revised: 08/13/2013 Document Reviewed: 04/17/2013 [facility] Patient Information 2015 [place], [place]. This information is not intended to replace advice given to you by your health care provider. Make sure you discuss any questions you have with your health care provider.

## 2014-02-16 NOTE — ED Notes (Signed)
Patient sts nausea has returned and feeling clammy. This RN gave pt alcohol pad to smell for temporary relief. Pt has emesis bag and is sitting on edge of bed. Pt in NAD, no dry heaving noted.

## 2014-02-16 NOTE — ED Notes (Signed)
PA at the bedside.

## 2014-02-16 NOTE — ED Notes (Signed)
Pt states she is followed by GI, has a hernia and abd pain from constipation.

## 2014-02-16 NOTE — Discharge Instructions (Signed)
Abdominal Pain, Women °Abdominal (stomach, pelvic, or belly) pain can be caused by many things. It is important to tell your doctor: °· The location of the pain. °· Does it come and go or is it present all the time? °· Are there things that start the pain (eating certain foods, exercise)? °· Are there other symptoms associated with the pain (fever, nausea, vomiting, diarrhea)? °All of this is helpful to know when trying to find the cause of the pain. °CAUSES  °· Stomach: virus or bacteria infection, or ulcer. °· Intestine: appendicitis (inflamed appendix), regional ileitis (Crohn's disease), ulcerative colitis (inflamed colon), irritable bowel syndrome, diverticulitis (inflamed diverticulum of the colon), or cancer of the stomach or intestine. °· Gallbladder disease or stones in the gallbladder. °· Kidney disease, kidney stones, or infection. °· Pancreas infection or cancer. °· Fibromyalgia (pain disorder). °· Diseases of the female organs: °¨ Uterus: fibroid (non-cancerous) tumors or infection. °¨ Fallopian tubes: infection or tubal pregnancy. °¨ Ovary: cysts or tumors. °¨ Pelvic adhesions (scar tissue). °¨ Endometriosis (uterus lining tissue growing in the pelvis and on the pelvic organs). °¨ Pelvic congestion syndrome (female organs filling up with blood just before the menstrual period). °¨ Pain with the menstrual period. °¨ Pain with ovulation (producing an egg). °¨ Pain with an IUD (intrauterine device, birth control) in the uterus. °¨ Cancer of the female organs. °· Functional pain (pain not caused by a disease, may improve without treatment). °· Psychological pain. °· Depression. °DIAGNOSIS  °Your doctor will decide the seriousness of your pain by doing an examination. °· Blood tests. °· X-rays. °· Ultrasound. °· CT scan (computed tomography, special type of X-ray). °· MRI (magnetic resonance imaging). °· Cultures, for infection. °· Barium enema (dye inserted in the large intestine, to better view it with  X-rays). °· Colonoscopy (looking in intestine with a lighted tube). °· Laparoscopy (minor surgery, looking in abdomen with a lighted tube). °· Major abdominal exploratory surgery (looking in abdomen with a large incision). °TREATMENT  °The treatment will depend on the cause of the pain.  °· Many cases can be observed and treated at home. °· Over-the-counter medicines recommended by your caregiver. °· Prescription medicine. °· Antibiotics, for infection. °· Birth control pills, for painful periods or for ovulation pain. °· Hormone treatment, for endometriosis. °· Nerve blocking injections. °· Physical therapy. °· Antidepressants. °· Counseling with a psychologist or psychiatrist. °· Minor or major surgery. °HOME CARE INSTRUCTIONS  °· Do not take laxatives, unless directed by your caregiver. °· Take over-the-counter pain medicine only if ordered by your caregiver. Do not take aspirin because it can cause an upset stomach or bleeding. °· Try a clear liquid diet (broth or water) as ordered by your caregiver. Slowly move to a bland diet, as tolerated, if the pain is related to the stomach or intestine. °· Have a thermometer and take your temperature several times a day, and record it. °· Bed rest and sleep, if it helps the pain. °· Avoid sexual intercourse, if it causes pain. °· Avoid stressful situations. °· Keep your follow-up appointments and tests, as your caregiver orders. °· If the pain does not go away with medicine or surgery, you may try: °¨ Acupuncture. °¨ Relaxation exercises (yoga, meditation). °¨ Group therapy. °¨ Counseling. °SEEK MEDICAL CARE IF:  °· You notice certain foods cause stomach pain. °· Your home care treatment is not helping your pain. °· You need stronger pain medicine. °· You want your IUD removed. °· You feel faint or   lightheaded. °· You develop nausea and vomiting. °· You develop a rash. °· You are having side effects or an allergy to your medicine. °SEEK IMMEDIATE MEDICAL CARE IF:  °· Your  pain does not go away or gets worse. °· You have a fever. °· Your pain is felt only in portions of the abdomen. The right side could possibly be appendicitis. The left lower portion of the abdomen could be colitis or diverticulitis. °· You are passing blood in your stools (bright red or black tarry stools, with or without vomiting). °· You have blood in your urine. °· You develop chills, with or without a fever. °· You pass out. °MAKE SURE YOU:  °· Understand these instructions. °· Will watch your condition. °· Will get help right away if you are not doing well or get worse. °Document Released: 06/05/2007 Document Revised: 10/31/2011 Document Reviewed: 06/25/2009 °[employer]® Patient Information ©2015 [employer]. This information is not intended to replace advice given to you by your health care provider. Make sure you discuss any questions you have with your health care provider. ° °

## 2014-02-18 NOTE — ED Provider Notes (Signed)
Medical screening examination/treatment/procedure(s) were performed by non-physician practitioner and as supervising physician I was immediately available for consultation/collaboration.   EKG Interpretation None       Virgel Manifold, MD 02/18/14 [phone]

## 2014-02-20 DIAGNOSIS — R1084 Generalized abdominal pain: Secondary | ICD-10-CM | POA: Diagnosis not present

## 2014-02-20 DIAGNOSIS — R112 Nausea with vomiting, unspecified: Secondary | ICD-10-CM | POA: Diagnosis not present

## 2014-02-20 DIAGNOSIS — K3189 Other diseases of stomach and duodenum: Secondary | ICD-10-CM | POA: Diagnosis not present

## 2014-03-11 ENCOUNTER — Ambulatory Visit (HOSPITAL_COMMUNITY)
Admission: RE | Admit: 2014-03-11 | Payer: BC Managed Care – PPO | Source: Ambulatory Visit | Admitting: Gastroenterology

## 2014-03-11 ENCOUNTER — Encounter (HOSPITAL_COMMUNITY): Admission: RE | Payer: Self-pay | Source: Ambulatory Visit

## 2014-03-11 SURGERY — COLONOSCOPY WITH PROPOFOL
Anesthesia: Monitor Anesthesia Care

## 2014-03-18 DIAGNOSIS — F319 Bipolar disorder, unspecified: Secondary | ICD-10-CM | POA: Diagnosis not present

## 2014-05-02 DIAGNOSIS — I129 Hypertensive chronic kidney disease with stage 1 through stage 4 chronic kidney disease, or unspecified chronic kidney disease: Secondary | ICD-10-CM | POA: Diagnosis not present

## 2014-05-02 DIAGNOSIS — F39 Unspecified mood [affective] disorder: Secondary | ICD-10-CM | POA: Diagnosis not present

## 2014-05-02 DIAGNOSIS — E7800 Pure hypercholesterolemia, unspecified: Secondary | ICD-10-CM | POA: Diagnosis not present

## 2014-05-02 DIAGNOSIS — Z1239 Encounter for other screening for malignant neoplasm of breast: Secondary | ICD-10-CM | POA: Diagnosis not present

## 2014-05-02 DIAGNOSIS — E1129 Type 2 diabetes mellitus with other diabetic kidney complication: Secondary | ICD-10-CM | POA: Diagnosis not present

## 2014-05-02 DIAGNOSIS — N183 Chronic kidney disease, stage 3 unspecified: Secondary | ICD-10-CM | POA: Diagnosis not present

## 2014-05-02 DIAGNOSIS — E039 Hypothyroidism, unspecified: Secondary | ICD-10-CM | POA: Diagnosis not present

## 2014-05-05 ENCOUNTER — Encounter (HOSPITAL_COMMUNITY): Payer: Self-pay | Admitting: Pharmacy Technician

## 2014-05-05 DIAGNOSIS — Z1231 Encounter for screening mammogram for malignant neoplasm of breast: Secondary | ICD-10-CM | POA: Diagnosis not present

## 2014-05-07 ENCOUNTER — Encounter: Payer: Self-pay | Admitting: Gynecology

## 2014-05-12 ENCOUNTER — Encounter (HOSPITAL_COMMUNITY): Payer: Self-pay | Admitting: *Deleted

## 2014-05-16 ENCOUNTER — Other Ambulatory Visit: Payer: Self-pay | Admitting: Gastroenterology

## 2014-05-16 NOTE — Addendum Note (Signed)
Addended by: Wilford Corner on: 05/16/2014 05:49 PM   Modules accepted: Orders

## 2014-05-20 ENCOUNTER — Encounter (HOSPITAL_COMMUNITY)
Admission: RE | Disposition: A | Discharge status: Home / Self Care | Payer: Self-pay | Source: Ambulatory Visit | Attending: Gastroenterology

## 2014-05-20 ENCOUNTER — Encounter (HOSPITAL_COMMUNITY): Payer: Medicare Other | Admitting: Registered Nurse

## 2014-05-20 ENCOUNTER — Ambulatory Visit (HOSPITAL_COMMUNITY)
Admission: RE | Admit: 2014-05-20 | Discharge: 2014-05-20 | Disposition: A | Discharge status: Home / Self Care | Payer: Medicare Other | Source: Ambulatory Visit | Attending: Gastroenterology | Admitting: Gastroenterology

## 2014-05-20 ENCOUNTER — Encounter (HOSPITAL_COMMUNITY): Payer: Self-pay | Admitting: *Deleted

## 2014-05-20 ENCOUNTER — Ambulatory Visit (HOSPITAL_COMMUNITY): Payer: Medicare Other | Admitting: Registered Nurse

## 2014-05-20 DIAGNOSIS — K5730 Diverticulosis of large intestine without perforation or abscess without bleeding: Secondary | ICD-10-CM | POA: Diagnosis not present

## 2014-05-20 DIAGNOSIS — R1013 Epigastric pain: Secondary | ICD-10-CM | POA: Diagnosis not present

## 2014-05-20 DIAGNOSIS — R1084 Generalized abdominal pain: Secondary | ICD-10-CM | POA: Insufficient documentation

## 2014-05-20 DIAGNOSIS — D131 Benign neoplasm of stomach: Secondary | ICD-10-CM | POA: Diagnosis not present

## 2014-05-20 DIAGNOSIS — K219 Gastro-esophageal reflux disease without esophagitis: Secondary | ICD-10-CM | POA: Diagnosis not present

## 2014-05-20 DIAGNOSIS — K315 Obstruction of duodenum: Secondary | ICD-10-CM | POA: Diagnosis not present

## 2014-05-20 DIAGNOSIS — K648 Other hemorrhoids: Secondary | ICD-10-CM | POA: Insufficient documentation

## 2014-05-20 DIAGNOSIS — K2940 Chronic atrophic gastritis without bleeding: Secondary | ICD-10-CM | POA: Diagnosis not present

## 2014-05-20 DIAGNOSIS — K260 Acute duodenal ulcer with hemorrhage: Secondary | ICD-10-CM | POA: Diagnosis not present

## 2014-05-20 DIAGNOSIS — K269 Duodenal ulcer, unspecified as acute or chronic, without hemorrhage or perforation: Secondary | ICD-10-CM | POA: Insufficient documentation

## 2014-05-20 DIAGNOSIS — K3189 Other diseases of stomach and duodenum: Secondary | ICD-10-CM | POA: Diagnosis not present

## 2014-05-20 DIAGNOSIS — R109 Unspecified abdominal pain: Secondary | ICD-10-CM | POA: Diagnosis not present

## 2014-05-20 HISTORY — PX: ESOPHAGOGASTRODUODENOSCOPY (EGD) WITH PROPOFOL: SHX5813

## 2014-05-20 HISTORY — PX: COLONOSCOPY WITH PROPOFOL: SHX5780

## 2014-05-20 LAB — GLUCOSE, CAPILLARY: GLUCOSE-CAPILLARY: 127 mg/dL — AB (ref 70–99)

## 2014-05-20 SURGERY — ESOPHAGOGASTRODUODENOSCOPY (EGD) WITH PROPOFOL
Anesthesia: Monitor Anesthesia Care

## 2014-05-20 MED ORDER — LIDOCAINE HCL (CARDIAC) 20 MG/ML IV SOLN
INTRAVENOUS | Status: AC
  Filled 2014-05-20: qty 5

## 2014-05-20 MED ORDER — MIDAZOLAM HCL 2 MG/2ML IJ SOLN
INTRAMUSCULAR | Status: AC
  Filled 2014-05-20: qty 2

## 2014-05-20 MED ORDER — PROPOFOL 10 MG/ML IV BOLUS
INTRAVENOUS | Status: AC
Start: 2014-05-20 — End: 2014-05-20
  Filled 2014-05-20: qty 20

## 2014-05-20 MED ORDER — PROPOFOL 10 MG/ML IV BOLUS
INTRAVENOUS | Status: AC
  Filled 2014-05-20: qty 20

## 2014-05-20 MED ORDER — LACTATED RINGERS IV SOLN
INTRAVENOUS | Status: DC
  Administered 2014-05-20: 1000 mL via INTRAVENOUS

## 2014-05-20 MED ORDER — LABETALOL HCL 5 MG/ML IV SOLN
INTRAVENOUS | Status: AC
  Filled 2014-05-20: qty 4

## 2014-05-20 MED ORDER — SODIUM CHLORIDE 0.9 % IV SOLN: INTRAVENOUS | Status: DC

## 2014-05-20 SURGICAL SUPPLY — 25 items

## 2014-05-20 NOTE — Discharge Instructions (Addendum)
No aspirin products for 2 weeks. Will call when pathology results are complete.

## 2014-05-20 NOTE — Interval H&P Note (Signed)
History and Physical Interval Note:  05/20/2014 9:52 AM  [name]  has presented today for surgery, with the diagnosis of abd.pain/dyspepsia  The various methods of treatment have been discussed with the patient and family. After consideration of risks, benefits and other options for treatment, the patient has consented to  Procedure(s): ESOPHAGOGASTRODUODENOSCOPY (EGD) WITH PROPOFOL (N/A) COLONOSCOPY WITH PROPOFOL (N/A) as a surgical intervention .  The patient's history has been reviewed, patient examined, no change in status, stable for surgery.  I have reviewed the patient's chart and labs.  Questions were answered to the patient's satisfaction.     [place] C.

## 2014-05-20 NOTE — Op Note (Addendum)
Colonoscopy Report  Indication: Generalized abdominal pain  Meds: MAC; Propofol per anesthesia  Findings: DRE normal; Pediatric colonoscope inserted into a fair-prepped colon and advanced to the cecum where the appendiceal orifice and ileocecal valve was visualized. In order to reach the cecum, repeated loop reduction and abdominal pressure was necessary due to excessive looping. During withdrawal scattered sigmoid diverticulosis was noted. No other mucosal abnormalities were seen. Retroflexion revealed small internal hemorrhoids. Photodocumentation of landmarks not possible due to technical difficulties.  Impression: 1. Sigmoid Diverticulosis   2. Internal hemorrhoids  Plan: Repeat colonoscopy in 10 years; High fiber diet

## 2014-05-20 NOTE — Anesthesia Preprocedure Evaluation (Addendum)
Anesthesia Evaluation  Patient identified by MRN, date of birth, ID band Patient awake    Reviewed: Allergy & Precautions, H&P , NPO status , Patient's Chart, lab work & pertinent test results  Airway Mallampati: II TM Distance: >3 FB Neck ROM: Full    Dental no notable dental hx.    Pulmonary shortness of breath, asthma , sleep apnea and Continuous Positive Airway Pressure Ventilation ,  breath sounds clear to auscultation  Pulmonary exam normal       Cardiovascular hypertension, Pt. on medications +CHF Rhythm:Regular Rate:Normal     Neuro/Psych negative neurological ROS  negative psych ROS   GI/Hepatic negative GI ROS, Neg liver ROS,   Endo/Other  diabetesMorbid obesity  Renal/GU negative Renal ROS  negative genitourinary   Musculoskeletal negative musculoskeletal ROS (+)   Abdominal   Peds negative pediatric ROS (+)  Hematology negative hematology ROS (+)   Anesthesia Other Findings   Reproductive/Obstetrics negative OB ROS                         Anesthesia Physical  Anesthesia Plan  ASA: III  Anesthesia Plan: MAC   Post-op Pain Management:    Induction:   Airway Management Planned: Nasal Cannula  Additional Equipment:   Intra-op Plan:   Post-operative Plan:   Informed Consent: I have reviewed the patients History and Physical, chart, labs and discussed the procedure including the risks, benefits and alternatives for the proposed anesthesia with the patient or authorized representative who has indicated his/her understanding and acceptance.   Dental advisory given  Plan Discussed with: CRNA  Anesthesia Plan Comments:        Anesthesia Quick Evaluation

## 2014-05-20 NOTE — H&P (Signed)
  Date of Initial H&P: 05/13/14  History reviewed, patient examined, no change in status, stable for surgery.

## 2014-05-20 NOTE — Op Note (Addendum)
EGD Report  Indication: GERD; Abdominal pain  Meds: MAC; Propofol per anesthesia  Findings: Endoscope was inserted into the oropharynx and the esophagus was intubated. The gastroesophageal junction was 42 cm from the incisors. The esophagus was normal in its entirety. The endoscope was advanced into the stomach and there were longitudinal markings of erythematous streaks consistent with mild antral gastritis. The endoscope was advanced into the duodenal bulb that was edematous in its distal aspect. At the 2nd portion of the duodenum the mucosa was severely edematous and the lumen was narrowed and mildly ulcerated and the the endoscope could not traverse this part of the duodenum. Biopsies were taken for histology. Biopsies of the stomach were taken. Upon return to the stomach retroflexion revealed a 1 cm sessile polyp in the proximal stomach that was removed with snare cautery. A small amount of bleeding at the polypectomy site resolved spontaneously. The endoscope was then withdrawn to confirm the above findings.  Impression:  1. Ulcerated and stenotic duodenal mucosa - s/p biopsies (see above for details)   2. Mild antral gastritis - s/p biopsies   3. Proximal gastric polyp - s/p snare cautery  Plan: F/U on path; No aspirin products for 2 weeks; Avoid other NSAIDs; Take PPI BID; Consider an upper GI series in the near future to see if an obstruction is present

## 2014-05-21 ENCOUNTER — Encounter (HOSPITAL_COMMUNITY): Payer: Self-pay | Admitting: Gastroenterology

## 2014-05-23 DIAGNOSIS — F319 Bipolar disorder, unspecified: Secondary | ICD-10-CM | POA: Diagnosis not present

## 2014-05-27 ENCOUNTER — Other Ambulatory Visit: Payer: Self-pay | Admitting: Gastroenterology

## 2014-05-27 DIAGNOSIS — R1084 Generalized abdominal pain: Secondary | ICD-10-CM

## 2014-05-27 DIAGNOSIS — K219 Gastro-esophageal reflux disease without esophagitis: Secondary | ICD-10-CM

## 2014-05-29 ENCOUNTER — Other Ambulatory Visit: Payer: Medicare Other

## 2014-06-12 ENCOUNTER — Ambulatory Visit
Admission: RE | Admit: 2014-06-12 | Discharge: 2014-06-12 | Disposition: A | Discharge status: Home / Self Care | Payer: Medicare Other | Source: Ambulatory Visit | Attending: Gastroenterology | Admitting: Gastroenterology

## 2014-06-12 DIAGNOSIS — K219 Gastro-esophageal reflux disease without esophagitis: Secondary | ICD-10-CM | POA: Diagnosis not present

## 2014-06-12 DIAGNOSIS — K449 Diaphragmatic hernia without obstruction or gangrene: Secondary | ICD-10-CM | POA: Diagnosis not present

## 2014-06-12 DIAGNOSIS — R1084 Generalized abdominal pain: Secondary | ICD-10-CM

## 2014-06-23 ENCOUNTER — Encounter (HOSPITAL_COMMUNITY): Payer: Self-pay | Admitting: Gastroenterology

## 2014-07-04 ENCOUNTER — Encounter: Payer: Self-pay | Admitting: Internal Medicine

## 2014-07-15 DIAGNOSIS — F319 Bipolar disorder, unspecified: Secondary | ICD-10-CM | POA: Diagnosis not present

## 2014-09-15 DIAGNOSIS — F319 Bipolar disorder, unspecified: Secondary | ICD-10-CM | POA: Diagnosis not present

## 2015-01-21 ENCOUNTER — Encounter (HOSPITAL_COMMUNITY): Payer: Self-pay | Admitting: Emergency Medicine

## 2015-01-21 ENCOUNTER — Emergency Department (HOSPITAL_COMMUNITY)
Admission: EM | Admit: 2015-01-21 | Discharge: 2015-01-21 | Discharge status: Against Medical Advice | Payer: Medicare Other | Attending: Emergency Medicine | Admitting: Emergency Medicine

## 2015-01-21 DIAGNOSIS — I10 Essential (primary) hypertension: Secondary | ICD-10-CM | POA: Insufficient documentation

## 2015-01-21 DIAGNOSIS — J45909 Unspecified asthma, uncomplicated: Secondary | ICD-10-CM | POA: Diagnosis not present

## 2015-01-21 DIAGNOSIS — E119 Type 2 diabetes mellitus without complications: Secondary | ICD-10-CM | POA: Insufficient documentation

## 2015-01-21 DIAGNOSIS — I509 Heart failure, unspecified: Secondary | ICD-10-CM | POA: Diagnosis not present

## 2015-01-21 LAB — CBC WITH DIFFERENTIAL/PLATELET
Basophils Absolute: 0.1 10*3/uL (ref 0.0–0.1)
Basophils Relative: 1 % (ref 0–1)
EOS ABS: 0.2 10*3/uL (ref 0.0–0.7)
Eosinophils Relative: 3 % (ref 0–5)
HEMATOCRIT: 38.9 % (ref 36.0–46.0)
Hemoglobin: 13.1 g/dL (ref 12.0–15.0)
LYMPHS ABS: 2.5 10*3/uL (ref 0.7–4.0)
LYMPHS PCT: 41 % (ref 12–46)
MCH: 30.2 pg (ref 26.0–34.0)
MCHC: 33.7 g/dL (ref 30.0–36.0)
MCV: 89.6 fL (ref 78.0–100.0)
MONOS PCT: 6 % (ref 3–12)
Monocytes Absolute: 0.4 10*3/uL (ref 0.1–1.0)
NEUTROS PCT: 49 % (ref 43–77)
Neutro Abs: 3 10*3/uL (ref 1.7–7.7)
Platelets: 269 10*3/uL (ref 150–400)
RBC: 4.34 MIL/uL (ref 3.87–5.11)
RDW: 13.5 % (ref 11.5–15.5)
WBC: 6.1 10*3/uL (ref 4.0–10.5)

## 2015-01-21 LAB — COMPREHENSIVE METABOLIC PANEL
ALT: 25 U/L (ref 14–54)
AST: 26 U/L (ref 15–41)
Albumin: 3.8 g/dL (ref 3.5–5.0)
Alkaline Phosphatase: 100 U/L (ref 38–126)
Anion gap: 10 (ref 5–15)
BUN: 22 mg/dL — ABNORMAL HIGH (ref 6–20)
CO2: 26 mmol/L (ref 22–32)
Calcium: 9.6 mg/dL (ref 8.9–10.3)
Chloride: 104 mmol/L (ref 101–111)
Creatinine, Ser: 1.17 mg/dL — ABNORMAL HIGH (ref 0.44–1.00)
GFR calc Af Amer: 57 mL/min — ABNORMAL LOW (ref >60)
GFR calc non Af Amer: 49 mL/min — ABNORMAL LOW (ref >60)
GLUCOSE: 160 mg/dL — AB (ref 65–99)
Potassium: 4.1 mmol/L (ref 3.5–5.1)
Sodium: 140 mmol/L (ref 135–145)
Total Bilirubin: 0.6 mg/dL (ref 0.3–1.2)
Total Protein: 6.8 g/dL (ref 6.5–8.1)

## 2015-01-21 NOTE — ED Notes (Signed)
Pt at desk requesting to have BP rechecked and states she would like to leave if it is improved, explained delay to patient, encouraged her to stay, pt states she will call her PCP in the morning

## 2015-01-21 NOTE — ED Notes (Signed)
Pt. reports elevated blood pressure at home this evening 275/135 , denies pain or discomfort , no headache or nausea.

## 2015-12-13 ENCOUNTER — Emergency Department (HOSPITAL_COMMUNITY)
Admission: EM | Admit: 2015-12-13 | Discharge: 2015-12-13 | Disposition: A | Discharge status: Home / Self Care | Payer: Medicare Other | Attending: Emergency Medicine | Admitting: Emergency Medicine

## 2015-12-13 ENCOUNTER — Encounter (HOSPITAL_COMMUNITY): Payer: Self-pay

## 2015-12-13 DIAGNOSIS — G4730 Sleep apnea, unspecified: Secondary | ICD-10-CM | POA: Insufficient documentation

## 2015-12-13 DIAGNOSIS — I509 Heart failure, unspecified: Secondary | ICD-10-CM | POA: Diagnosis not present

## 2015-12-13 DIAGNOSIS — Z791 Long term (current) use of non-steroidal anti-inflammatories (NSAID): Secondary | ICD-10-CM | POA: Diagnosis not present

## 2015-12-13 DIAGNOSIS — E119 Type 2 diabetes mellitus without complications: Secondary | ICD-10-CM | POA: Insufficient documentation

## 2015-12-13 DIAGNOSIS — Z7982 Long term (current) use of aspirin: Secondary | ICD-10-CM | POA: Diagnosis not present

## 2015-12-13 DIAGNOSIS — R319 Hematuria, unspecified: Secondary | ICD-10-CM

## 2015-12-13 DIAGNOSIS — M1990 Unspecified osteoarthritis, unspecified site: Secondary | ICD-10-CM | POA: Insufficient documentation

## 2015-12-13 DIAGNOSIS — F319 Bipolar disorder, unspecified: Secondary | ICD-10-CM | POA: Diagnosis not present

## 2015-12-13 DIAGNOSIS — Z9981 Dependence on supplemental oxygen: Secondary | ICD-10-CM | POA: Diagnosis not present

## 2015-12-13 DIAGNOSIS — Z79899 Other long term (current) drug therapy: Secondary | ICD-10-CM | POA: Diagnosis not present

## 2015-12-13 DIAGNOSIS — R300 Dysuria: Secondary | ICD-10-CM | POA: Diagnosis present

## 2015-12-13 DIAGNOSIS — J45909 Unspecified asthma, uncomplicated: Secondary | ICD-10-CM | POA: Insufficient documentation

## 2015-12-13 DIAGNOSIS — F419 Anxiety disorder, unspecified: Secondary | ICD-10-CM | POA: Insufficient documentation

## 2015-12-13 DIAGNOSIS — I10 Essential (primary) hypertension: Secondary | ICD-10-CM | POA: Insufficient documentation

## 2015-12-13 DIAGNOSIS — Z8719 Personal history of other diseases of the digestive system: Secondary | ICD-10-CM | POA: Insufficient documentation

## 2015-12-13 DIAGNOSIS — E785 Hyperlipidemia, unspecified: Secondary | ICD-10-CM | POA: Insufficient documentation

## 2015-12-13 DIAGNOSIS — E039 Hypothyroidism, unspecified: Secondary | ICD-10-CM | POA: Diagnosis not present

## 2015-12-13 DIAGNOSIS — N390 Urinary tract infection, site not specified: Secondary | ICD-10-CM | POA: Diagnosis not present

## 2015-12-13 HISTORY — DX: Hypothyroidism, unspecified: E03.9

## 2015-12-13 LAB — URINALYSIS, ROUTINE W REFLEX MICROSCOPIC
GLUCOSE, UA: NEGATIVE mg/dL
Ketones, ur: NEGATIVE mg/dL
Nitrite: POSITIVE — AB
PROTEIN: 30 mg/dL — AB
SPECIFIC GRAVITY, URINE: 1.023 (ref 1.005–1.030)
pH: 5 (ref 5.0–8.0)

## 2015-12-13 LAB — URINE MICROSCOPIC-ADD ON

## 2015-12-13 MED ORDER — CEPHALEXIN 500 MG PO CAPS: 500.0000 mg | ORAL_CAPSULE | Freq: Four times a day (QID) | ORAL | Status: DC

## 2015-12-13 MED ORDER — DEXTROSE 5 % IV SOLN
1.0000 g | Freq: Once | INTRAVENOUS | Status: AC
  Administered 2015-12-13: 1 g via INTRAVENOUS
  Filled 2015-12-13: qty 10

## 2015-12-13 NOTE — ED Provider Notes (Signed)
CSN: [id number]     Arrival date & time 12/13/15  1456 History   First MD Initiated Contact with Patient 12/13/15 1604     Chief Complaint  Patient presents with  . Bladder Pain    . Dysuria  . Hematuria     (Consider location/radiation/quality/duration/timing/severity/associated sxs/prior Treatment) The history is provided by the patient and medical records. No language interpreter was used.     [name] is a 65 y.o. female  with a hx of arthritis, asthma, NIDDM, CHF, HTN, hypothyroidism presents to the Emergency Department complaining of gradual, persistent, progressively worsening bladder pain/spasm with associated dysuria onset 11/03/15.  She was seen by her primary care shortly after this and given a prescription for Pyridium but no antibiotics. She reports today she developed hematuria.  She has been taking pyridium TID without relief.  Patient denies fever, chills, nausea, vomiting. She does endorse some suprapubic abdominal pain that has been dull and persistent for several weeks. No worsening of her abdominal pain. She also reports associated pain at her urethra and a feeling of her urethra being "swollen." He denies vaginal discharge or vaginal bleeding. Patient denies a history of kidney infection.    Past Medical History  Diagnosis Date  . Arthritis   . Asthma   . Diabetes mellitus   . CHF (congestive heart failure) ([place])   . Hypertension   . Hyperlipidemia   . Pericarditis, viral 2010 October  . Depression   . Anxiety   . Bipolar 1 disorder ([place])   . Hernia     umbilical  . Sleep apnea     uses cpap setting of 15  . Complication of anesthesia     left a bad taste in my mouth it has lasted since january   . Hypothyroidism    Past Surgical History  Procedure Laterality Date  . Abdominal hysterectomy    . Foot surgery      bone spurs   . Cholecystectomy  1984    open  . Appendectomy    . Knee arthroscopy  09/07/2012    Procedure: ARTHROSCOPY KNEE;   Surgeon: Newt Minion, MD;  Location: [facility];  Service: Orthopedics;  Laterality: Left;  Left Knee Arthroscopy  . Esophagogastroduodenoscopy (egd) with propofol N/A 05/20/2014    Procedure: ESOPHAGOGASTRODUODENOSCOPY (EGD) WITH PROPOFOL;  Surgeon: Lear Ng, MD;  Location: [facility];  Service: Endoscopy;  Laterality: N/A;  . Colonoscopy with propofol N/A 05/20/2014    Procedure: COLONOSCOPY WITH PROPOFOL;  Surgeon: Lear Ng, MD;  Location: [facility];  Service: Endoscopy;  Laterality: N/A;   Family History  Problem Relation Age of Onset  . Diabetes Mother   . Hypertension Mother   . Hyperlipidemia Mother   . Diabetes Father   . Hypertension Father   . Hyperlipidemia Father   . Hypertension Sister    Social History  Substance Use Topics  . Smoking status: Never Smoker   . Smokeless tobacco: Never Used  . Alcohol Use: No   OB History    Gravida Para Term Preterm AB TAB SAB Ectopic Multiple Living   2 2        2      Review of Systems  Constitutional: Negative for fever, diaphoresis, appetite change, fatigue and unexpected weight change.  HENT: Negative for mouth sores.   Eyes: Negative for visual disturbance.  Respiratory: Negative for cough, chest tightness, shortness of breath and wheezing.   Cardiovascular: Negative for chest pain.  Gastrointestinal:  Negative for nausea, vomiting, abdominal pain, diarrhea and constipation.  Endocrine: Negative for polydipsia, polyphagia and polyuria.  Genitourinary: Positive for dysuria and hematuria. Negative for urgency and frequency.  Musculoskeletal: Negative for back pain and neck stiffness.  Skin: Negative for rash.  Allergic/Immunologic: Negative for immunocompromised state.  Neurological: Negative for syncope, light-headedness and headaches.  Hematological: Does not bruise/bleed easily.  Psychiatric/Behavioral: Negative for sleep disturbance. The patient is not nervous/anxious.       Allergies  Celexa;  Maxzide; Metformin and related; Reglan; Risperdal; Trazodone and nefazodone; Zestril; and Lithium  Home Medications   Prior to Admission medications   Medication Sig Start Date End Date Taking? Authorizing Provider  ALPRAZolam (XANAX XR) 0.5 MG 24 hr tablet Take 0.5 mg by mouth 3 (three) times daily as needed for anxiety.   Yes Historical Provider, MD  aspirin 81 MG tablet Take 81 mg by mouth daily.     Yes Historical Provider, MD  busPIRone (BUSPAR) 30 MG tablet Take 30 mg by mouth 2 (two) times daily.   Yes Historical Provider, MD  esomeprazole (NEXIUM) 40 MG capsule Take 1 capsule (40 mg total) by mouth daily. 02/16/14  Yes Elyn Peers, MD  etodolac (LODINE) 400 MG tablet Take 400 mg by mouth 2 (two) times daily. 12/01/13  Yes Historical Provider, MD  furosemide (LASIX) 20 MG tablet Take 20 mg by mouth daily as needed (for severe swelling).    Yes Historical Provider, MD  lamoTRIgine (LAMICTAL) 150 MG tablet Take 150 mg by mouth 2 (two) times daily.    Yes Historical Provider, MD  levocetirizine (XYZAL) 5 MG tablet Take 5 mg by mouth every evening.   Yes Historical Provider, MD  levothyroxine (SYNTHROID, LEVOTHROID) 75 MCG tablet Take 75 mcg by mouth daily.   Yes Historical Provider, MD  lisinopril (PRINIVIL,ZESTRIL) 20 MG tablet Take 20 mg by mouth daily.   Yes Historical Provider, MD  metoprolol succinate (TOPROL-XL) 50 MG 24 hr tablet Take 50 mg by mouth daily. Take with or immediately following a meal.   Yes Historical Provider, MD  phenazopyridine (PYRIDIUM) 200 MG tablet Take 200 mg by mouth 3 (three) times daily as needed for pain.  12/10/15  Yes Historical Provider, MD  polyethylene glycol (MIRALAX / GLYCOLAX) packet Take 17 g by mouth 2 (two) times daily as needed for mild constipation.   Yes Historical Provider, MD  potassium chloride SA (K-DUR,KLOR-CON) 20 MEQ tablet Take 20 mEq by mouth daily.   Yes Historical Provider, MD  QUEtiapine (SEROQUEL XR) 400 MG 24 hr tablet Take 800 mg by  mouth at bedtime.   Yes Historical Provider, MD  QUEtiapine (SEROQUEL) 200 MG tablet Take 200 mg by mouth at bedtime as needed (sleep).   Yes Historical Provider, MD  simvastatin (ZOCOR) 40 MG tablet Take 40 mg by mouth daily.    Yes Historical Provider, MD  zolpidem (AMBIEN) 10 MG tablet Take 10 mg by mouth at bedtime as needed for sleep.    Yes Historical Provider, MD  cephALEXin (KEFLEX) 500 MG capsule Take 1 capsule (500 mg total) by mouth 4 (four) times daily. 12/13/15   Macarena Langseth, PA-C   BP 144/118 mmHg  Pulse 93  Temp(Src) 97.8 F (36.6 C) (Oral)  Resp 16  SpO2 97% Physical Exam  Constitutional: She appears well-developed and well-nourished. No distress.  Awake, alert, nontoxic appearance  HENT:  Head: Normocephalic and atraumatic.  Mouth/Throat: Oropharynx is clear and moist. No oropharyngeal exudate.  Eyes: Conjunctivae are normal.  No scleral icterus.  Neck: Normal range of motion. Neck supple.  Cardiovascular: Normal rate, regular rhythm, normal heart sounds and intact distal pulses.   Pulmonary/Chest: Effort normal and breath sounds normal. No respiratory distress. She has no wheezes.  Equal chest expansion  Abdominal: Soft. Bowel sounds are normal. She exhibits no mass. There is tenderness in the suprapubic area. There is no rebound and no guarding.  Large panus Mild suprapubic abd tenderness  Genitourinary: There is no rash, tenderness, lesion or injury on the right labia. There is no rash, tenderness, lesion or injury on the left labia. No vaginal discharge found.  Introitus erythematous and raw appearing; no vaginal discharge noted; no vaginal bleeding noted on external exam  Musculoskeletal: Normal range of motion. She exhibits no edema.  Neurological: She is alert.  Speech is clear and goal oriented Moves extremities without ataxia  Skin: Skin is warm and dry. She is not diaphoretic.  Psychiatric: She has a normal mood and affect.  Nursing note and vitals  reviewed.   ED Course  Procedures (including critical care time) Labs Review Labs Reviewed  URINALYSIS, ROUTINE W REFLEX MICROSCOPIC (NOT AT [facility]) - Abnormal; Notable for the following:    Color, Urine RED (*)    APPearance TURBID (*)    Hgb urine dipstick LARGE (*)    Bilirubin Urine LARGE (*)    Protein, ur 30 (*)    Nitrite POSITIVE (*)    Leukocytes, UA LARGE (*)    All other components within normal limits  URINE MICROSCOPIC-ADD ON - Abnormal; Notable for the following:    Squamous Epithelial / LPF 6-30 (*)    Bacteria, UA MANY (*)    All other components within normal limits     MDM   Final diagnoses:  UTI (lower urinary tract infection)  Dysuria  Hematuria   Coty H Titzer presents with UTI symptoms.  Pt has been diagnosed with a UTI. Pt is afebrile, no CVA tenderness, normotensive, and denies N/V. Pt given Rocephin here in the ED.  Pt to be dc home with antibiotics and instructions to follow up with [facility] and PCP if symptoms persist.  BP 135/66 mmHg  Pulse 73  Temp(Src) 97.8 F (36.6 C) (Oral)  Resp 18  SpO2 94%    Abigail Butts, PA-C 12/13/15 1806  Harvel Quale, MD 12/16/15 [phone]

## 2015-12-13 NOTE — ED Notes (Signed)
Pt c/o bladder pain/spasms and dysuria since 3/14 and hematuria starting this morning.  Pain score 10/10.  Pt report being seen by PCP for same and placed Pyridium x 3 times for day.  Pt reports that medication is not helping.

## 2015-12-13 NOTE — Discharge Instructions (Signed)
1. Medications: Keflex, pyridium, usual home medications 2. Treatment: rest, drink plenty of fluids, take medications as prescribed 3. Follow Up: Please followup with your primary doctor in 3 days for discussion of your diagnoses and further evaluation after today's visit; if you do not have a primary care doctor use the resource guide provided to find one; return to the ER for fevers, persistent vomiting, worsening abdominal pain or other concerning symptoms.  

## 2016-03-21 ENCOUNTER — Encounter (HOSPITAL_COMMUNITY): Payer: Self-pay | Admitting: Emergency Medicine

## 2016-03-21 ENCOUNTER — Emergency Department (HOSPITAL_COMMUNITY)
Admission: EM | Admit: 2016-03-21 | Discharge: 2016-03-21 | Disposition: A | Discharge status: Home / Self Care | Payer: Medicare Other | Attending: Dermatology | Admitting: Dermatology

## 2016-03-21 DIAGNOSIS — Z5321 Procedure and treatment not carried out due to patient leaving prior to being seen by health care provider: Secondary | ICD-10-CM | POA: Diagnosis not present

## 2016-03-21 DIAGNOSIS — R109 Unspecified abdominal pain: Secondary | ICD-10-CM | POA: Diagnosis present

## 2016-03-21 LAB — URINALYSIS, ROUTINE W REFLEX MICROSCOPIC
Glucose, UA: NEGATIVE mg/dL
Ketones, ur: 15 mg/dL — AB
Nitrite: POSITIVE — AB
PROTEIN: 100 mg/dL — AB
SPECIFIC GRAVITY, URINE: 1.026 (ref 1.005–1.030)
pH: 5 (ref 5.0–8.0)

## 2016-03-21 LAB — URINE MICROSCOPIC-ADD ON

## 2016-03-21 NOTE — ED Triage Notes (Signed)
Called twice for reassess triage vitals. No response

## 2016-03-21 NOTE — ED Triage Notes (Signed)
Pt reports R flank pain for the past few days that got worse this afternoon. Pt diagnosed with a kidney stone in April and has appointment with [facility] tomorrow, but could not wait. Also having dysuria and hematuria.

## 2016-03-23 ENCOUNTER — Encounter (HOSPITAL_COMMUNITY): Payer: Self-pay

## 2016-03-23 ENCOUNTER — Emergency Department (HOSPITAL_COMMUNITY)
Admission: EM | Admit: 2016-03-23 | Discharge: 2016-03-23 | Disposition: A | Discharge status: Home / Self Care | Payer: Medicare Other | Attending: Emergency Medicine | Admitting: Emergency Medicine

## 2016-03-23 ENCOUNTER — Emergency Department (HOSPITAL_COMMUNITY): Payer: Medicare Other

## 2016-03-23 DIAGNOSIS — I110 Hypertensive heart disease with heart failure: Secondary | ICD-10-CM | POA: Diagnosis not present

## 2016-03-23 DIAGNOSIS — J45909 Unspecified asthma, uncomplicated: Secondary | ICD-10-CM | POA: Diagnosis not present

## 2016-03-23 DIAGNOSIS — I509 Heart failure, unspecified: Secondary | ICD-10-CM | POA: Diagnosis not present

## 2016-03-23 DIAGNOSIS — N390 Urinary tract infection, site not specified: Secondary | ICD-10-CM | POA: Diagnosis not present

## 2016-03-23 DIAGNOSIS — Z79899 Other long term (current) drug therapy: Secondary | ICD-10-CM | POA: Insufficient documentation

## 2016-03-23 DIAGNOSIS — E039 Hypothyroidism, unspecified: Secondary | ICD-10-CM | POA: Diagnosis not present

## 2016-03-23 DIAGNOSIS — E119 Type 2 diabetes mellitus without complications: Secondary | ICD-10-CM | POA: Insufficient documentation

## 2016-03-23 DIAGNOSIS — R109 Unspecified abdominal pain: Secondary | ICD-10-CM | POA: Diagnosis present

## 2016-03-23 DIAGNOSIS — Z7982 Long term (current) use of aspirin: Secondary | ICD-10-CM | POA: Insufficient documentation

## 2016-03-23 LAB — URINALYSIS, ROUTINE W REFLEX MICROSCOPIC
GLUCOSE, UA: NEGATIVE mg/dL
KETONES UR: 15 mg/dL — AB
Nitrite: POSITIVE — AB
PROTEIN: 30 mg/dL — AB
Specific Gravity, Urine: 1.028 (ref 1.005–1.030)
pH: 5 (ref 5.0–8.0)

## 2016-03-23 LAB — URINE MICROSCOPIC-ADD ON

## 2016-03-23 LAB — CBC
HEMATOCRIT: 40.8 % (ref 36.0–46.0)
HEMOGLOBIN: 13.5 g/dL (ref 12.0–15.0)
MCH: 30 pg (ref 26.0–34.0)
MCHC: 33.1 g/dL (ref 30.0–36.0)
MCV: 90.7 fL (ref 78.0–100.0)
Platelets: 266 10*3/uL (ref 150–400)
RBC: 4.5 MIL/uL (ref 3.87–5.11)
RDW: 13.6 % (ref 11.5–15.5)
WBC: 7.6 10*3/uL (ref 4.0–10.5)

## 2016-03-23 LAB — BASIC METABOLIC PANEL
Anion gap: 7 (ref 5–15)
BUN: 23 mg/dL — AB (ref 6–20)
CALCIUM: 9.8 mg/dL (ref 8.9–10.3)
CHLORIDE: 106 mmol/L (ref 101–111)
CO2: 27 mmol/L (ref 22–32)
CREATININE: 0.98 mg/dL (ref 0.44–1.00)
GFR calc non Af Amer: >60 mL/min (ref >60)
GLUCOSE: 143 mg/dL — AB (ref 65–99)
Potassium: 4.4 mmol/L (ref 3.5–5.1)
Sodium: 140 mmol/L (ref 135–145)

## 2016-03-23 MED ORDER — ONDANSETRON HCL 4 MG/2ML IJ SOLN
4.0000 mg | Freq: Once | INTRAMUSCULAR | Status: AC
  Administered 2016-03-23: 4 mg via INTRAVENOUS
  Filled 2016-03-23: qty 2

## 2016-03-23 MED ORDER — MORPHINE SULFATE (PF) 4 MG/ML IV SOLN
4.0000 mg | Freq: Once | INTRAVENOUS | Status: AC
  Administered 2016-03-23: 4 mg via INTRAVENOUS
  Filled 2016-03-23: qty 1

## 2016-03-23 MED ORDER — SODIUM CHLORIDE 0.9 % IV BOLUS (SEPSIS)
1000.0000 mL | Freq: Once | INTRAVENOUS | Status: AC
  Administered 2016-03-23: 1000 mL via INTRAVENOUS

## 2016-03-23 MED ORDER — DEXTROSE 5 % IV SOLN
1.0000 g | Freq: Once | INTRAVENOUS | Status: AC
  Administered 2016-03-23: 1 g via INTRAVENOUS
  Filled 2016-03-23: qty 10

## 2016-03-23 MED ORDER — CEPHALEXIN 500 MG PO CAPS: 500.0000 mg | ORAL_CAPSULE | Freq: Four times a day (QID) | ORAL | 0 refills | Status: DC

## 2016-03-23 NOTE — ED Notes (Signed)
PA at bedside.

## 2016-03-23 NOTE — Discharge Instructions (Signed)
Please read and follow all provided instructions.  Your diagnoses today include:  1. UTI (lower urinary tract infection)    Tests performed today include: Urine test - suggests that you have an infection in your bladder Vital signs. See below for your results today.   Medications prescribed:  Take as prescribed   Home care instructions:  Follow any educational materials contained in this packet.  Follow-up instructions: Please follow-up with your Urologist at your scheduled appointment  Return instructions:  Please return to the Emergency Department if you experience worsening symptoms.  Return with fever, worsening pain, persistent vomiting, worsening pain in your back.  Please return if you have any other emergent concerns.  Additional Information:  Your vital signs today were: BP 161/91 (BP Location: Right Arm)    Pulse 74    Temp 98 F (36.7 C) (Oral)    Resp 17    Ht 5\' 5"  (1.651 m)    Wt 125.2 kg    SpO2 96%    BMI 45.93 kg/m  If your blood pressure (BP) was elevated above 135/85 this visit, please have this repeated by your doctor within one month. --------------

## 2016-03-23 NOTE — ED Provider Notes (Signed)
[facility] DEPT Provider Note   CSN: [id number] Arrival date & time: 03/23/16  1646  First Provider Contact:  First MD Initiated Contact with Patient 03/23/16 1854     History   Chief Complaint Chief Complaint  Patient presents with  . Bladder Pain  . Flank Pain  . Dysuria    HPI [name] is a 65 y.o. female.  HPI  65 y.o. female with a hx of DM, HTN, Kidney Stones, Recurrent UTI, presents to the Emergency Department today complaining of dysuria and right flank pain since Friday. Notes similar in the past with dx of pyelonephritis. States pain is 9/10 and feels like burning sensation and throbbing. Notes episode of hematuria, but resolved. No fevers at home. No N/V. No CP/SOB/BD pain. Seen by [facility] (Dr. Jeffie Pollock). No numbness/tingling. No headaches. No other symptoms noted.   Past Medical History:  Diagnosis Date  . Anxiety   . Arthritis   . Asthma   . Bipolar 1 disorder ([place])   . CHF (congestive heart failure) ([place])   . Complication of anesthesia    left a bad taste in my mouth it has lasted since january   . Depression   . Diabetes mellitus   . Hernia    umbilical  . Hyperlipidemia   . Hypertension   . Hypothyroidism   . Pericarditis, viral 2010 October  . Sleep apnea    uses cpap setting of 15    Patient Active Problem List   Diagnosis Date Noted  . GERD (gastroesophageal reflux disease) 05/20/2014  . Abdominal pain, generalized 05/20/2014  . Nausea and vomiting 11/15/2012  . Diarrhea 11/15/2012  . OSA on CPAP 11/15/2012  . Bipolar 1 disorder, depressed ([place]) 11/15/2012  . Incisional hernia without mention of obstruction or gangrene 07/19/2011  . DYSPNEA 10/25/2010  . HYPERLIPIDEMIA-MIXED 10/22/2010  . OBESITY-MORBID (>100') 10/22/2010  . HYPERTENSION, UNSPECIFIED 10/22/2010  . Atrial fibrillation ([place]) 10/22/2010  . CHEST PAIN-UNSPECIFIED 10/22/2010    Past Surgical History:  Procedure Laterality Date  . ABDOMINAL HYSTERECTOMY    .  APPENDECTOMY    . CHOLECYSTECTOMY  1984   open  . COLONOSCOPY WITH PROPOFOL N/A 05/20/2014   Procedure: COLONOSCOPY WITH PROPOFOL;  Surgeon: Lear Ng, MD;  Location: [facility];  Service: Endoscopy;  Laterality: N/A;  . ESOPHAGOGASTRODUODENOSCOPY (EGD) WITH PROPOFOL N/A 05/20/2014   Procedure: ESOPHAGOGASTRODUODENOSCOPY (EGD) WITH PROPOFOL;  Surgeon: Lear Ng, MD;  Location: [facility];  Service: Endoscopy;  Laterality: N/A;  . FOOT SURGERY     bone spurs   . KNEE ARTHROSCOPY  09/07/2012   Procedure: ARTHROSCOPY KNEE;  Surgeon: Newt Minion, MD;  Location: [facility];  Service: Orthopedics;  Laterality: Left;  Left Knee Arthroscopy    OB History    Gravida Para Term Preterm AB Living   2 2       2    SAB TAB Ectopic Multiple Live Births                   Home Medications    Prior to Admission medications   Medication Sig Start Date End Date Taking? Authorizing Provider  ALPRAZolam (XANAX XR) 0.5 MG 24 hr tablet Take 0.5 mg by mouth 3 (three) times daily as needed for anxiety.    Historical Provider, MD  aspirin 81 MG tablet Take 81 mg by mouth daily.      Historical Provider, MD  busPIRone (BUSPAR) 30 MG tablet Take 30 mg by mouth 2 (two)  times daily.    Historical Provider, MD  cephALEXin (KEFLEX) 500 MG capsule Take 1 capsule (500 mg total) by mouth 4 (four) times daily. 12/13/15   Hannah Muthersbaugh, PA-C  esomeprazole (NEXIUM) 40 MG capsule Take 1 capsule (40 mg total) by mouth daily. 02/16/14   Elyn Peers, MD  etodolac (LODINE) 400 MG tablet Take 400 mg by mouth 2 (two) times daily. 12/01/13   Historical Provider, MD  furosemide (LASIX) 20 MG tablet Take 20 mg by mouth daily as needed (for severe swelling).     Historical Provider, MD  lamoTRIgine (LAMICTAL) 150 MG tablet Take 150 mg by mouth 2 (two) times daily.     Historical Provider, MD  levocetirizine (XYZAL) 5 MG tablet Take 5 mg by mouth every evening.    Historical Provider, MD  levothyroxine  (SYNTHROID, LEVOTHROID) 75 MCG tablet Take 75 mcg by mouth daily.    Historical Provider, MD  lisinopril (PRINIVIL,ZESTRIL) 20 MG tablet Take 20 mg by mouth daily.    Historical Provider, MD  metoprolol succinate (TOPROL-XL) 50 MG 24 hr tablet Take 50 mg by mouth daily. Take with or immediately following a meal.    Historical Provider, MD  phenazopyridine (PYRIDIUM) 200 MG tablet Take 200 mg by mouth 3 (three) times daily as needed for pain.  12/10/15   Historical Provider, MD  polyethylene glycol (MIRALAX / GLYCOLAX) packet Take 17 g by mouth 2 (two) times daily as needed for mild constipation.    Historical Provider, MD  potassium chloride SA (K-DUR,KLOR-CON) 20 MEQ tablet Take 20 mEq by mouth daily.    Historical Provider, MD  QUEtiapine (SEROQUEL XR) 400 MG 24 hr tablet Take 800 mg by mouth at bedtime.    Historical Provider, MD  QUEtiapine (SEROQUEL) 200 MG tablet Take 200 mg by mouth at bedtime as needed (sleep).    Historical Provider, MD  simvastatin (ZOCOR) 40 MG tablet Take 40 mg by mouth daily.     Historical Provider, MD  zolpidem (AMBIEN) 10 MG tablet Take 10 mg by mouth at bedtime as needed for sleep.     Historical Provider, MD    Family History Family History  Problem Relation Age of Onset  . Diabetes Mother   . Hypertension Mother   . Hyperlipidemia Mother   . Diabetes Father   . Hypertension Father   . Hyperlipidemia Father   . Hypertension Sister     Social History Social History  Substance Use Topics  . Smoking status: Never Smoker  . Smokeless tobacco: Never Used  . Alcohol use No     Allergies   Celexa [citalopram hydrobromide]; Maxzide [triamterene-hctz]; Metformin and related; Reglan [metoclopramide]; Risperdal [risperidone]; Trazodone and nefazodone; Zestril [lisinopril]; and Lithium   Review of Systems Review of Systems ROS reviewed and all are negative for acute change except as noted in the HPI.  Physical Exam Updated Vital Signs BP 159/75 (BP  Location: Right Arm)   Pulse 91   Temp 98.2 F (36.8 C) (Oral)   Resp 16   SpO2 95%   Physical Exam  Constitutional: She is oriented to person, place, and time. Vital signs are normal. She appears well-developed and well-nourished.  Morbidly Obese  HENT:  Head: Normocephalic.  Right Ear: Hearing normal.  Left Ear: Hearing normal.  Eyes: Conjunctivae and EOM are normal. Pupils are equal, round, and reactive to light.  Cardiovascular: Normal rate, regular rhythm, normal heart sounds and intact distal pulses.   Pulmonary/Chest: Effort normal and breath sounds  normal. No respiratory distress.  Abdominal: Soft. There is CVA tenderness (right).  Neurological: She is alert and oriented to person, place, and time.  Skin: Skin is warm and dry.  Psychiatric: She has a normal mood and affect. Her speech is normal and behavior is normal. Thought content normal.   ED Treatments / Results  Labs (all labs ordered are listed, but only abnormal results are displayed) Labs Reviewed  URINALYSIS, ROUTINE W REFLEX MICROSCOPIC (NOT AT [facility]) - Abnormal; Notable for the following:       Result Value   Color, Urine ORANGE (*)    APPearance TURBID (*)    Hgb urine dipstick MODERATE (*)    Bilirubin Urine LARGE (*)    Ketones, ur 15 (*)    Protein, ur 30 (*)    Nitrite POSITIVE (*)    Leukocytes, UA LARGE (*)    All other components within normal limits  URINE MICROSCOPIC-ADD ON - Abnormal; Notable for the following:    Squamous Epithelial / LPF 6-30 (*)    Bacteria, UA MANY (*)    All other components within normal limits  BASIC METABOLIC PANEL - Abnormal; Notable for the following:    Glucose, Bld 143 (*)    BUN 23 (*)    All other components within normal limits  CBC    EKG  EKG Interpretation None       Radiology Ct Renal Stone Study  Result Date: 03/23/2016 CLINICAL DATA:  Right-sided flank pain which became acutely worse this afternoon. Possible left-sided stone diagnosed May  2017. EXAM: CT ABDOMEN AND PELVIS WITHOUT CONTRAST TECHNIQUE: Multidetector CT imaging of the abdomen and pelvis was performed following the standard protocol without IV contrast. COMPARISON:  CT of the abdomen and pelvis without and with contrast 01/15/2016. FINDINGS: Lower chest: Focal pleural thickening is noted laterally in the left lower lobe. There is associated airspace disease. Lungs are otherwise clear. Coronary artery calcifications are noted. The heart the no significant pleural or pericardial effusion is present. Hepatobiliary: No focal hepatic lesions are present. The common bile duct is within normal limits following cholecystectomy. Pancreas: No significant inflammatory changes are present. No cystic or solid lesions are present. There is no significant ductal dilation. Spleen: Within normal limits. Adrenals/Urinary Tract: The adrenal glands are normal bilaterally. A tiny density is again seen in the midportion of the left kidney. This could represent a small nonobstructing stone or potentially be related to vascular disease. No significant lesions are present in the right kidney. There is no obstruction. The ureters are within normal limits bilaterally. The urinary bladder is mostly collapsed. Stomach/Bowel: The stomach is within normal limits. The duodenum is unremarkable. Small bowel is within normal limits as well. The appendix is not discretely visualized and may be surgically absent. The terminal ileum is within normal limits. The ascending and transverse colon is within normal limits bilaterally. Diverticular changes are present within the descending and sigmoid colon without focal inflammation to suggest diverticulitis. Vascular/Lymphatic: Scattered atherosclerotic calcifications are present. There is no aneurysm or stenosis. No significant adenopathy is present. Reproductive: Hysterectomy is noted. The adnexa are within normal limits for age. Other: No significant free fluid or free air is  present. Musculoskeletal: Multilevel degenerative changes are again noted in the lumbar spine. There vacuum disc at L3-4 and L4-5. Multilevel facet hypertrophy is present. The anatomic pelvis is intact. Degenerate changes are noted at the SI joints bilaterally. IMPRESSION: 1. Punctate tiny density in the left kidney may represent a  stone or minimal atherosclerotic calcification. 2. No definite stones in either kidney.  No obstruction. 3. Descending and sigmoid diverticulosis without diverticulitis. 4. Scattered atherosclerotic calcifications including coronary artery disease. 5. Chronic scarring and pleural thickening in the left lower lobe. Electronically Signed   By: San Morelle M.D.   On: 03/23/2016 20:24    Procedures Procedures (including critical care time)  Medications Ordered in ED Medications  cefTRIAXone (ROCEPHIN) 1 g in dextrose 5 % 50 mL IVPB (1 g Intravenous New Bag/Given 03/23/16 1950)  sodium chloride 0.9 % bolus 1,000 mL (1,000 mLs Intravenous New Bag/Given 03/23/16 1948)  morphine 4 MG/ML injection 4 mg (4 mg Intravenous Given 03/23/16 1949)   Initial Impression / Assessment and Plan / ED Course  I have reviewed the triage vital signs and the nursing notes.  Pertinent labs & imaging results that were available during my care of the patient were reviewed by me and considered in my medical decision making (see chart for details).  Clinical Course    Final Clinical Impressions(s) / ED Diagnoses  I have reviewed and evaluated the relevant laboratory valuesI have reviewed and evaluated the relevant imaging studies. I have reviewed the relevant previous healthcare records. I obtained HPI from historian. Patient discussed with supervising physician  ED Course:  Assessment: Pt is a 62yF with hx HTN, Kidney Stones, Recurrent UTi who presents with right flank pain wit hdysuria since Friday. On exam, pt in NAD. Nontoxic/nonseptic appearing. VSS. Afebrile. Lungs CTA. Heart RRR.  Abdomen nontender soft. CVA on right flank. UA shows UT. CT Renal possible stone in left kidney, but no definite stones. No obstruction. Given fluids and rocephin in ED. Plan is to DC home and treat for UTI. At time of discharge, Patient is in no acute distress. Vital Signs are stable. Patient is able to ambulate. Patient able to tolerate PO.    Disposition/Plan:  DC Home Additional Verbal discharge instructions given and discussed with patient.  Pt Instructed to f/u with PCP in the next week for evaluation and treatment of symptoms. Return precautions given Pt acknowledges and agrees with plan  Supervising Physician Charlesetta Shanks, MD   Final diagnoses:  UTI (lower urinary tract infection)    New Prescriptions New Prescriptions   No medications on file     Shary Decamp, PA-C 03/23/16 2047    Charlesetta Shanks, MD 03/24/16 [phone]

## 2016-03-23 NOTE — ED Triage Notes (Signed)
Pt c/o bladder, R flank, and urethral pain and dysuria x 5 days.  Pain score 9/10.  Pt reports being seen by [facility] yesterday for same.  Sts "they told me that there is nothing they can do and that I needed to go to the hospital."  Sts she needs "the procedure where they go up inside your urethra."  Pt has been taking PYRIDIUM w/o relief.

## 2016-03-27 LAB — URINE CULTURE: Culture: >=100000 — AB

## 2016-03-28 ENCOUNTER — Telehealth (HOSPITAL_BASED_OUTPATIENT_CLINIC_OR_DEPARTMENT_OTHER): Payer: Self-pay | Admitting: Emergency Medicine

## 2016-03-28 NOTE — Telephone Encounter (Signed)
Post ED Visit - Positive Culture Follow-up  Culture report reviewed by antimicrobial stewardship pharmacist:  []  Elenor Quinones, Pharm.D. []  Heide Guile, Pharm.D., BCPS []  Parks Neptune, Pharm.D. []  Alycia Rossetti, Pharm.D., BCPS []  [place], Pharm.D., BCPS, AAHIVP []  Legrand Como, Pharm.D., BCPS, AAHIVP []  Milus Glazier, Pharm.D. []  Rob Evette Doffing, Pharm.D. Myer Peer PharmD  Positive urine culture Treated with cephalexin, organism sensitive to the same and no further patient follow-up is required at this time.  Hazle Nordmann 03/28/2016, 10:45 AM

## 2017-01-04 ENCOUNTER — Encounter: Payer: Self-pay | Admitting: Gynecology

## 2017-03-06 ENCOUNTER — Other Ambulatory Visit: Payer: Self-pay | Admitting: Family Medicine

## 2017-03-06 DIAGNOSIS — Z1231 Encounter for screening mammogram for malignant neoplasm of breast: Secondary | ICD-10-CM

## 2017-03-15 ENCOUNTER — Ambulatory Visit: Payer: Medicare Other

## 2017-03-16 ENCOUNTER — Ambulatory Visit
Admission: RE | Admit: 2017-03-16 | Discharge: 2017-03-16 | Disposition: A | Discharge status: Home / Self Care | Payer: Medicare Other | Source: Ambulatory Visit | Attending: Family Medicine | Admitting: Family Medicine

## 2017-03-16 DIAGNOSIS — Z1231 Encounter for screening mammogram for malignant neoplasm of breast: Secondary | ICD-10-CM

## 2017-12-04 ENCOUNTER — Encounter: Payer: Self-pay | Admitting: Podiatry

## 2017-12-04 ENCOUNTER — Ambulatory Visit: Payer: Medicare Other | Admitting: Podiatry

## 2017-12-04 DIAGNOSIS — B351 Tinea unguium: Secondary | ICD-10-CM

## 2017-12-04 DIAGNOSIS — M79675 Pain in left toe(s): Secondary | ICD-10-CM | POA: Diagnosis not present

## 2017-12-04 DIAGNOSIS — E1149 Type 2 diabetes mellitus with other diabetic neurological complication: Secondary | ICD-10-CM | POA: Diagnosis not present

## 2017-12-04 DIAGNOSIS — M79674 Pain in right toe(s): Secondary | ICD-10-CM | POA: Diagnosis not present

## 2017-12-04 DIAGNOSIS — L84 Corns and callosities: Secondary | ICD-10-CM | POA: Diagnosis not present

## 2017-12-04 NOTE — Progress Notes (Signed)
Subjective:    Patient ID: [name], female    DOB: [birth year], 67 y.o.   MRN: [id number]  HPI 67 year old female presents the office today for concerns of thick, discolored, elongated toenails that she cannot trim herself.  She states that her left big toenail did come off when she hit itSeveral weeks ago.  He states the nail came off and since then has been doing well she had no redness or drainage or any swelling.  She said no pain in that toe since the nails come off.  She also gets calluses to both of her big toes which is been ongoing for several years and denies any redness or drainage or any swelling.  She has no other concerns today.  She is diabetic her last blood sugar this morning she reports was 145.   Review of Systems  All other systems reviewed and are negative.  Past Medical History:  Diagnosis Date  . Anxiety   . Arthritis   . Asthma   . Bipolar 1 disorder ([place])   . CHF (congestive heart failure) ([place])   . Complication of anesthesia    left a bad taste in my mouth it has lasted since january   . Depression   . Diabetes mellitus   . Hernia    umbilical  . Hyperlipidemia   . Hypertension   . Hypothyroidism   . Pericarditis, viral 2010 October  . Sleep apnea    uses cpap setting of 15    Past Surgical History:  Procedure Laterality Date  . ABDOMINAL HYSTERECTOMY    . APPENDECTOMY    . CHOLECYSTECTOMY  1984   open  . COLONOSCOPY WITH PROPOFOL N/A 05/20/2014   Procedure: COLONOSCOPY WITH PROPOFOL;  Surgeon: Lear Ng, MD;  [facility];  Service: Endoscopy;  Laterality: N/A;  . ESOPHAGOGASTRODUODENOSCOPY (EGD) WITH PROPOFOL N/A 05/20/2014   Procedure: ESOPHAGOGASTRODUODENOSCOPY (EGD) WITH PROPOFOL;  Surgeon: Lear Ng, MD;  Location: [facility];  Service: Endoscopy;  Laterality: N/A;  . FOOT SURGERY     bone spurs   . KNEE ARTHROSCOPY  09/07/2012   Procedure: ARTHROSCOPY KNEE;  Surgeon: Newt Minion, MD;  Location: [facility];  Service: Orthopedics;  Laterality: Left;  Left Knee Arthroscopy     Current Outpatient Medications:  .  ALPRAZolam (XANAX XR) 0.5 MG 24 hr tablet, Take 0.5 mg by mouth 3 (three) times daily as needed for anxiety., Disp: , Rfl:  .  aspirin 81 MG tablet, Take 81 mg by mouth daily.  , Disp: , Rfl:  .  busPIRone (BUSPAR) 30 MG tablet, Take 30 mg by mouth 2 (two) times daily., Disp: , Rfl:  .  cephALEXin (KEFLEX) 500 MG capsule, Take 1 capsule (500 mg total) by mouth 4 (four) times daily. (Patient not taking: Reported on 12/04/2017), Disp: 20 capsule, Rfl: 0 .  esomeprazole (NEXIUM) 40 MG capsule, Take 1 capsule (40 mg total) by mouth daily., Disp: 30 capsule, Rfl: 0 .  etodolac (LODINE) 400 MG tablet, Take 400 mg by mouth 2 (two) times daily., Disp: , Rfl:  .  furosemide (LASIX) 20 MG tablet, Take 20 mg by mouth daily as needed (for severe swelling). , Disp: , Rfl:  .  lamoTRIgine (LAMICTAL) 150 MG tablet, Take 150 mg by mouth 2 (two) times daily. , Disp: , Rfl:  .  levocetirizine (XYZAL) 5 MG tablet, Take 5 mg by mouth every evening., Disp: , Rfl:  .  levothyroxine (SYNTHROID, LEVOTHROID)  75 MCG tablet, Take 75 mcg by mouth daily., Disp: , Rfl:  .  lisinopril (PRINIVIL,ZESTRIL) 20 MG tablet, Take 20 mg by mouth daily., Disp: , Rfl:  .  metoprolol succinate (TOPROL-XL) 50 MG 24 hr tablet, Take 50 mg by mouth daily. Take with or immediately following a meal., Disp: , Rfl:  .  phenazopyridine (PYRIDIUM) 200 MG tablet, Take 200 mg by mouth 3 (three) times daily as needed for pain. , Disp: , Rfl:  .  polyethylene glycol (MIRALAX / GLYCOLAX) packet, Take 17 g by mouth 2 (two) times daily as needed for mild constipation., Disp: , Rfl:  .  potassium chloride SA (K-DUR,KLOR-CON) 20 MEQ tablet, Take 20 mEq by mouth daily., Disp: , Rfl:  .  QUEtiapine (SEROQUEL XR) 400 MG 24 hr tablet, Take 800 mg by mouth at bedtime., Disp: , Rfl:  .  QUEtiapine (SEROQUEL) 200 MG tablet, Take 200 mg by mouth at bedtime  as needed (sleep)., Disp: , Rfl:  .  simvastatin (ZOCOR) 40 MG tablet, Take 40 mg by mouth daily. , Disp: , Rfl:  .  zolpidem (AMBIEN) 10 MG tablet, Take 10 mg by mouth at bedtime as needed for sleep. , Disp: , Rfl:   Allergies  Allergen Reactions  . Celexa [Citalopram Hydrobromide] Nausea Only  . Maxzide [Triamterene-Hctz]     Hurt patients kidneys  . Metformin And Related Nausea And Vomiting  . Reglan [Metoclopramide] Nausea Only  . Risperdal [Risperidone] Nausea Only    Also causes hallucinations  . Trazodone And Nefazodone Nausea Only  . Zestril [Lisinopril] Nausea Only  . Lithium Palpitations and Other (See Comments)    Shakes and delusional pt started seeing things      Social History   Socioeconomic History  . Marital status: Married    Spouse name: Not on file  . Number of children: Not on file  . Years of education: Not on file  . Highest education level: Not on file  Occupational History  . Not on file  Social Needs  . Financial resource strain: Not on file  . Food insecurity:    Worry: Not on file    Inability: Not on file  . Transportation needs:    Medical: Not on file    Non-medical: Not on file  Tobacco Use  . Smoking status: Never Smoker  . Smokeless tobacco: Never Used  Substance and Sexual Activity  . Alcohol use: No  . Drug use: No  . Sexual activity: Not on file  Lifestyle  . Physical activity:    Days per week: Not on file    Minutes per session: Not on file  . Stress: Not on file  Relationships  . Social connections:    Talks on phone: Not on file    Gets together: Not on file    Attends religious service: Not on file    Active member of club or organization: Not on file    Attends meetings of clubs or organizations: Not on file    Relationship status: Not on file  . Intimate partner violence:    Fear of current or ex partner: Not on file    Emotionally abused: Not on file    Physically abused: Not on file    Forced sexual activity:  Not on file  Other Topics Concern  . Not on file  Social History Narrative  . Not on file         Objective:   Physical Exam General:  AAO x3, NAD  Dermatological: S nails are hypertrophic, dystrophic, discolored with yellow-brown discoloration is nails 2 through 5 on the left and 1 through 5 on the right.  Left hallux toenail small piece of nail has started to grow back in however there is no edema, erythema, drainage or pus or any signs of infection to any of the toenails including the left hallux toenail.  Hyperkeratotic lesions bilateral medial hallux.  Upon debridement there was no underlying ulceration, drainage or signs of infection.  No other open lesions or pre-ulcerative lesions identified today.  Vascular: Dorsalis Pedis artery and Posterior Tibial artery pedal pulses are 2/4 bilateral with immedate capillary fill time. Pedal hair growth present. No varicosities and no lower extremity edema present bilateral. There is no pain with calf compression, swelling, warmth, erythema.   Neruologic: Sensation mildly decreased with Derrel Nip monofilament  Musculoskeletal: No gross boney pedal deformities bilateral. No pain, crepitus, or limitation noted with foot and ankle range of motion bilateral. Muscular strength 5/5 in all groups tested bilateral.      Assessment & Plan:  67 year old female with onycholysis left hallux toenail is ecstatic onychomycosis, hyperkeratotic lesions -Treatment options discussed including all alternatives, risks, and complications -Etiology of symptoms were discussed -Nails debrided 9 without complications or bleeding. -Hyperkeratotic lesion sub-debrided x2 without any complications or bleeding -Daily foot inspection -Follow-up in 3 months or sooner if any problems arise. In the meantime, encouraged to call the office with any questions, concerns, change in symptoms.   Celesta Gentile, DPM

## 2017-12-04 NOTE — Patient Instructions (Signed)

## 2018-02-08 ENCOUNTER — Encounter: Payer: Self-pay | Admitting: Psychology

## 2018-03-05 ENCOUNTER — Ambulatory Visit: Payer: Medicare Other | Admitting: Podiatry

## 2018-03-23 ENCOUNTER — Ambulatory Visit: Payer: Medicare Other | Admitting: Podiatry

## 2018-03-23 ENCOUNTER — Encounter: Payer: Self-pay | Admitting: Podiatry

## 2018-03-23 DIAGNOSIS — M79674 Pain in right toe(s): Secondary | ICD-10-CM | POA: Diagnosis not present

## 2018-03-23 DIAGNOSIS — M79675 Pain in left toe(s): Secondary | ICD-10-CM

## 2018-03-23 DIAGNOSIS — L84 Corns and callosities: Secondary | ICD-10-CM

## 2018-03-23 DIAGNOSIS — B351 Tinea unguium: Secondary | ICD-10-CM | POA: Diagnosis not present

## 2018-03-23 DIAGNOSIS — E1149 Type 2 diabetes mellitus with other diabetic neurological complication: Secondary | ICD-10-CM

## 2018-03-27 NOTE — Progress Notes (Signed)
Subjective: 68 y.o. returns the office today for painful, elongated, thickened toenails which she cannot trim herself. Denies any redness or drainage around the nails. Denies any acute changes since last appointment and no new complaints today. Denies any systemic complaints such as fevers, chills, nausea, vomiting.   PCP: Gaynelle Arabian, MD  Objective: AAO 3, NAD DP/PT pulses palpable, CRT less than 3 seconds Nails hypertrophic, dystrophic, elongated, brittle, discolored 9. There is tenderness overlying the nails 1-5 bilaterally except for the left hallux. There is no surrounding erythema or drainage along the nail sites. Hyperkeratotic lesions bilateral medial hallux.  Upon treatment there is no underlying ulceration drainage or any signs of infection. No open lesions or pre-ulcerative lesions are identified. No other areas of tenderness bilateral lower extremities. No overlying edema, erythema, increased warmth. No pain with calf compression, swelling, warmth, erythema.  Assessment: Patient presents with symptomatic onychomycosis, hyperkeratotic lesions  Plan: -Treatment options including alternatives, risks, complications were discussed -Nails sharply debrided 9 without complication/bleeding. -Hyperkeratotic lesions were sharply debrided x2 without any comp occasions or bleeding. -Discussed daily foot inspection. If there are any changes, to call the office immediately.  -Follow-up in 3 months or sooner if any problems are to arise. In the meantime, encouraged to call the office with any questions, concerns, changes symptoms.  Celesta Gentile, DPM

## 2018-06-18 ENCOUNTER — Encounter: Payer: Self-pay | Admitting: Psychology

## 2018-06-18 ENCOUNTER — Ambulatory Visit: Payer: Medicare Other | Admitting: Psychology

## 2018-06-18 DIAGNOSIS — R413 Other amnesia: Secondary | ICD-10-CM

## 2018-06-18 DIAGNOSIS — F3160 Bipolar disorder, current episode mixed, unspecified: Secondary | ICD-10-CM

## 2018-06-18 NOTE — Progress Notes (Signed)
   Neuropsychology Note  [name] completed 60 minutes of neuropsychological testing with technician, Milana Kidney, BS, under the supervision of Dr. Macarthur Critchley, Licensed Psychologist. The patient did not appear overtly distressed by the testing session, per behavioral observation or via self-report to the technician. Rest breaks were offered.   Clinical Decision Making: In considering the patient's current level of functioning, level of presumed impairment, nature of symptoms, emotional and behavioral responses during the interview, level of literacy, and observed level of motivation/effort, a battery of tests was selected and communicated to the psychometrician.  Communication between the psychologist and technician was ongoing throughout the testing session and changes were made as deemed necessary based on patient performance on testing, technician observations and additional pertinent factors such as those listed above.  [name] will return within approximately 2 weeks for an interactive feedback session with Dr. Si Raider at which time her test performances, clinical impressions and treatment recommendations will be reviewed in detail. The patient understands she can contact our office should she require our assistance before this time.  35 minutes spent performing neuropsychological evaluation services/clinical decision making (psychologist). [CPT 26834] 60 minutes spent face-to-face with patient administering standardized tests, 30 minutes spent scoring (technician). [CPT Y8200648, 19622]  Full report to follow.

## 2018-06-18 NOTE — Progress Notes (Signed)
NEUROBEHAVIORAL STATUS EXAM   Name: [name] Date of Birth: [birth year] Date of Interview: 06/18/2018  Reason for Referral:  [name] is a 67 y.o. female who is referred for neuropsychological evaluation by Dr. Gaynelle Arabian of [facility] due to concerns about memory decline. This patient is accompanied in the office by her husband, [name], who supplements the history.  History of Presenting Problem:  [name] and her husband report gradual cognitive decline over approximately the past 4-5 years. 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   The patient has a history of bipolar disorder with racing thoughts. She reports she has racing thoughts daily and incessantly. The only break from them is sleep. She sleeps a lot, some days 20 hours. She has been sleeping a lot for the past 20 years per her husband. However they also note she has episodes where she is up 3-4 days at a time and then "crashes". She is followed by psychiatry. She has been seeing [name] for many years. She has had therapy in the past but is not presently working with a therapist. She has had inpatient psychiatric hospitalizations in the past for bipolar and anxiety. They think the most recent one was around 2006. She denies suicidal ideation or intention. Her husband states she has spoken of suicidal thoughts in the past but not recently. She denies history of psychosis other than related to medication effects (lithium). She and her husband report she has very unusual side effects to many medications.   In terms of cognitive functioning, she has difficulty concentrating and staying on task due to racing thoughts. She often has trouble finishing a sentence, per her husband. He also reports  concerns about her driving due to her inability to concentrate.   Upon direct questioning, the patient and her husband reported the following with regard to current cognitive functioning:   Forgetting recent conversations/events: Yes Repeating statements/questions: Yes Misplacing/losing items: Yes Forgetting appointments or other obligations: Yes (husband has to manage) Forgetting to take medications: Yes (husband had to take over)  Word-finding difficulty: Yes Comprehension difficulty: Yes  Getting lost when driving: No Uncertain about directions when driving or passenger: Yes  She lives with her husband. He manages all instrumental ADLs for her (medications, appointments, finances/bills, meals, and most of the; she does drive on rare occasions. Her husband does not want her to drive, however. She has not worked since 2003. When she was working she did not have any cognitive problems that interfered with her work.   With regard to current mood the patient reports she has more anxiety than depression. She does experience depression some days. She reports anhedonia and loss of interest. She used to enjoy working in the yard and now she doesn't care to. She reports significant, longstanding negative thoughts about herself. She reports "I don't like myself, I never have". Her husband states she has an "inferiority complex". He also states she is "narcissistic".   Physically, she reports feeling "too drugged". She had one fall a little while ago when she tripped on her husband's cane. Otherwise she has not had any history of falls. She has sleep apnea and uses her CPAP. She does not drink alcohol. She denies history of substance abuse/dependence.  There is no known family history of dementia in her biological family. There is history of mood disorder in her biological family, and her daughter has bipolar disorder as well.    Social History: Born/Raised: She is adopted. She was raised in [place]. She  did meet her birth family later in life. 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 Marital history: Married since 41. They have two children (son age 34, daughter age 42) and one granddaughter.  Alcohol: None, never a drinker Tobacco: None, never a smoker   Medical History: Past Medical History:  Diagnosis Date  . Anxiety   . Arthritis   . Asthma   . Bipolar 1 disorder ([place])   . CHF (congestive heart failure) ([place])   . Complication of anesthesia    left a bad taste in my mouth it has lasted since january   . Depression   . Diabetes mellitus   . Hernia    umbilical  . Hyperlipidemia   . Hypertension   . Hypothyroidism   . Pericarditis, viral 2010 October  . Sleep apnea    uses cpap setting of 15     Current Medications:  Outpatient Encounter Medications as of 06/18/2018  Medication Sig  . ALPRAZolam (XANAX XR) 0.5 MG 24 hr tablet Take 0.5 mg by mouth 3 (three) times daily as needed for anxiety.  [name] aspirin 81 MG tablet Take 81 mg by mouth daily.    . busPIRone (BUSPAR) 30 MG tablet Take 30 mg by mouth 2 (two) times daily.  . cephALEXin (KEFLEX) 500 MG capsule Take 1 capsule (500 mg total) by mouth 4 (four) times daily.  [name] esomeprazole (NEXIUM) 40 MG capsule Take 1 capsule (40 mg total) by mouth daily.  [name] etodolac (LODINE) 400 MG tablet Take 400 mg by mouth 2 (two) times daily.  . furosemide (LASIX) 20 MG tablet Take 20 mg by mouth daily as needed (for severe swelling).   [name] lamoTRIgine (LAMICTAL) 150 MG tablet Take 150 mg by mouth 2 (two) times daily.   [name] levocetirizine (XYZAL) 5 MG tablet Take 5 mg by mouth every evening.  [name] levothyroxine (SYNTHROID, LEVOTHROID) 75 MCG tablet Take 75 mcg by mouth daily.  [name] lisinopril (PRINIVIL,ZESTRIL) 20 MG tablet Take 20 mg by mouth daily.  . metoprolol succinate (TOPROL-XL) 50 MG 24 hr tablet Take 50 mg by mouth daily. Take with  or immediately following a meal.  . phenazopyridine (PYRIDIUM) 200 MG tablet Take 200 mg by mouth 3 (three) times daily as needed for pain.   . polyethylene glycol (MIRALAX / GLYCOLAX) packet Take 17 g by mouth 2 (two) times daily as needed for mild constipation.  . potassium chloride SA (K-DUR,KLOR-CON) 20 MEQ tablet Take 20 mEq by mouth daily.  . QUEtiapine (SEROQUEL XR) 400 MG 24 hr tablet Take 800 mg by mouth at bedtime.  [name] QUEtiapine (SEROQUEL) 200 MG tablet Take 200 mg by mouth at bedtime as needed (sleep).  . simvastatin (ZOCOR) 40 MG tablet Take 40 mg by mouth daily.   . Vitamin D, Ergocalciferol, (DRISDOL) 50000 units CAPS capsule   . zolpidem (AMBIEN) 10 MG tablet Take 10 mg by mouth at bedtime as needed for sleep.    No facility-administered encounter medications on file as of 06/18/2018.      Behavioral Observations:   Appearance: Neatly and appropriately dressed and groomed Gait: Ambulated independently, no gross abnormalities observed  Speech: Generally fluent; mildly reduced volume, mild dyarthria. No significant word finding difficulty observed. Thought process: Tangential Affect: Full range, labile, bursts into tears episodically Interpersonal: Pleasant, appropriate   50 minutes spent face-to-face with patient completing neurobehavioral status exam. 45 minutes spent integrating medical records/clinical data and completing this report. CPT codes [id number] unit; [id number] unit.   TESTING: There is medical necessity to proceed with neuropsychological assessment as the results will be used to aid in differential diagnosis and clinical decision-making and to inform specific treatment recommendations. Per the patient, her husband and medical records reviewed, there has been a change in cognitive functioning and a reasonable suspicion of neurocognitive disorder (likely related to history of bipolar disorder and medication effects).  Clinical Decision Making: In considering the  patient's current level of functioning, level of presumed impairment, nature of symptoms, emotional and behavioral responses during the interview, level of literacy, and observed level of motivation, a battery of tests was selected and communicated to the psychometrician.    Following the clinical interview/neurobehavioral status exam, the patient completed this full battery of neuropsychological testing with my psychometrician under my supervision (see separate note).   PLAN: The patient will return to see me for a follow-up session at which time her test performances and my impressions and treatment recommendations will be reviewed in detail.  Evaluation ongoing; full report to follow.

## 2018-06-26 ENCOUNTER — Ambulatory Visit (INDEPENDENT_AMBULATORY_CARE_PROVIDER_SITE_OTHER): Payer: Medicare Other | Admitting: Psychiatry

## 2018-06-26 ENCOUNTER — Encounter: Payer: Self-pay | Admitting: Psychiatry

## 2018-06-26 DIAGNOSIS — F4001 Agoraphobia with panic disorder: Secondary | ICD-10-CM | POA: Diagnosis not present

## 2018-06-26 DIAGNOSIS — F3181 Bipolar II disorder: Secondary | ICD-10-CM | POA: Diagnosis not present

## 2018-06-26 DIAGNOSIS — F411 Generalized anxiety disorder: Secondary | ICD-10-CM

## 2018-06-26 DIAGNOSIS — R7989 Other specified abnormal findings of blood chemistry: Secondary | ICD-10-CM

## 2018-06-26 DIAGNOSIS — G3184 Mild cognitive impairment, so stated: Secondary | ICD-10-CM | POA: Diagnosis not present

## 2018-06-26 NOTE — Progress Notes (Signed)
[name] [id number] [birth year] 67 y.o.  Subjective:   Patient ID:  [name] is a 67 y.o. (DOB 10-15-1950) female.  Chief Complaint:  Chief Complaint  Patient presents with  . Anxiety  . Stress  . Memory Loss  . Sleeping Problem    HPI [name] presents to the office today for follow-up of Chronic TR GAD and panic, BPII, MCI.    Seen alone and with H.  H reports worsening STM, repeats herself.  He's afraid she might not be attentive enough driving bc of racing thoughts.  She has a hard time finishing sentences DT racing thoughts.  Forgetful of conversation.  He says her mood is the same as always.  She's controlling about minor things.  He and D have told her she's narcissistic.  Loses thoughts.  She and he agree that the anxiety is bad and worse. Pt reports that mood is Anxious and describes anxiety as Moderate. Anxiety symptoms include: Excessive Worry, Panic Symptoms, Specific Phobias,. Pt reports no sleep issues. Pt reports that appetite is good. Pt reports that energy is lethargic and down slightly. Concentration is difficulty with focus and attention. Suicidal thoughts:  denied by patient.  Chronic anxiety from childhood.   Fearful of getting dementia like her M did.  Had neuropsych testing but results are not available yet.  She does not feel she can reduce BZ any further, DT anxiety.  Chronically worried re: money and D.  Rarely drives.  Has even gotten too claustrophobic to have her nails done which she's done for years.  Prior psychiatric medication trials include Lexapro, risperidone 4 mg which was sedating, aripiprazole, Topamax for anxiety, perphenazine, olanzapine for anxiety, and lithium which was not tolerated even at very low dosages.  She was intolerant of both Namenda and Aricept.   Review of Systems:  Review of Systems  Neurological: Negative for tremors and weakness.  Psychiatric/Behavioral: Positive for decreased concentration and dysphoric  mood. Negative for agitation, behavioral problems, confusion, hallucinations, self-injury, sleep disturbance and suicidal ideas. The patient is not nervous/anxious and is not hyperactive.     Medications: I have reviewed the patient's current medications.  Current Outpatient Medications  Medication Sig Dispense Refill  . ALPRAZolam (XANAX XR) 0.5 MG 24 hr tablet Take 0.5 mg by mouth 3 (three) times daily as needed for anxiety.    [name] aspirin 81 MG tablet Take 81 mg by mouth daily.      . busPIRone (BUSPAR) 30 MG tablet Take 30 mg by mouth 2 (two) times daily.    . cephALEXin (KEFLEX) 500 MG capsule Take 1 capsule (500 mg total) by mouth 4 (four) times daily. 20 capsule 0  . esomeprazole (NEXIUM) 40 MG capsule Take 1 capsule (40 mg total) by mouth daily. 30 capsule 0  . etodolac (LODINE) 400 MG tablet Take 400 mg by mouth 2 (two) times daily.    [name] lamoTRIgine (LAMICTAL) 150 MG tablet Take 150 mg by mouth 2 (two) times daily.     [name] levocetirizine (XYZAL) 5 MG tablet Take 5 mg by mouth every evening.    [name] levothyroxine (SYNTHROID, LEVOTHROID) 75 MCG tablet Take 75 mcg by mouth daily.    [name] lisinopril (PRINIVIL,ZESTRIL) 20 MG tablet Take 20 mg by mouth daily.    . metoprolol succinate (TOPROL-XL) 50 MG 24 hr tablet Take 50 mg by mouth daily. Take with or immediately following a meal.    . phenazopyridine (PYRIDIUM) 200 MG tablet Take 200 mg  by mouth 3 (three) times daily as needed for pain.     . polyethylene glycol (MIRALAX / GLYCOLAX) packet Take 17 g by mouth 2 (two) times daily as needed for mild constipation.    . potassium chloride SA (K-DUR,KLOR-CON) 20 MEQ tablet Take 20 mEq by mouth daily.    . QUEtiapine (SEROQUEL XR) 400 MG 24 hr tablet Take 800 mg by mouth at bedtime.    [name] QUEtiapine (SEROQUEL) 200 MG tablet Take 200 mg by mouth at bedtime as needed (sleep).    . simvastatin (ZOCOR) 40 MG tablet Take 40 mg by mouth daily.     . Vitamin D, Ergocalciferol, (DRISDOL) 50000 units CAPS  capsule Take 50,000 Units by mouth 2 (two) times a week.     . furosemide (LASIX) 20 MG tablet Take 20 mg by mouth daily as needed (for severe swelling).      No current facility-administered medications for this visit.     Medication Side Effects: None  Allergies:  Allergies  Allergen Reactions  . Celexa [Citalopram Hydrobromide] Nausea Only  . Maxzide [Triamterene-Hctz]     Hurt patients kidneys  . Metformin And Related Nausea And Vomiting  . Reglan [Metoclopramide] Nausea Only  . Risperdal [Risperidone] Nausea Only    Also causes hallucinations  . Trazodone And Nefazodone Nausea Only  . Zestril [Lisinopril] Nausea Only  . Lithium Palpitations and Other (See Comments)    Shakes and delusional pt started seeing things      Past Medical History:  Diagnosis Date  . Anxiety   . Arthritis   . Asthma   . Bipolar 1 disorder ([place])   . CHF (congestive heart failure) ([place])   . Complication of anesthesia    left a bad taste in my mouth it has lasted since january   . Depression   . Diabetes mellitus   . Hernia    umbilical  . Hyperlipidemia   . Hypertension   . Hypothyroidism   . Pericarditis, viral 2010 October  . Sleep apnea    uses cpap setting of 15    Family History  Problem Relation Age of Onset  . Diabetes Mother   . Hypertension Mother   . Hyperlipidemia Mother   . Diabetes Father   . Hypertension Father   . Hyperlipidemia Father   . Hypertension Sister     Social History   Socioeconomic History  . Marital status: Married    Spouse name: Not on file  . Number of children: Not on file  . Years of education: Not on file  . Highest education level: Not on file  Occupational History  . Not on file  Social Needs  . Financial resource strain: Not on file  . Food insecurity:    Worry: Not on file    Inability: Not on file  . Transportation needs:    Medical: Not on file    Non-medical: Not on file  Tobacco Use  . Smoking status: Never Smoker  .  Smokeless tobacco: Never Used  Substance and Sexual Activity  . Alcohol use: No  . Drug use: No  . Sexual activity: Not on file  Lifestyle  . Physical activity:    Days per week: Not on file    Minutes per session: Not on file  . Stress: Not on file  Relationships  . Social connections:    Talks on phone: Not on file    Gets together: Not on file    Attends religious  service: Not on file    Active member of club or organization: Not on file    Attends meetings of clubs or organizations: Not on file    Relationship status: Not on file  . Intimate partner violence:    Fear of current or ex partner: Not on file    Emotionally abused: Not on file    Physically abused: Not on file    Forced sexual activity: Not on file  Other Topics Concern  . Not on file  Social History Narrative  . Not on file    Past Medical History, Surgical history, Social history, and Family history were reviewed and updated as appropriate.   Please see review of systems for further details on the patient's review from today.   Objective:   Physical Exam:  There were no vitals taken for this visit.  Physical Exam  Neurological: She displays no tremor. Gait normal.  Psychiatric: Her mood appears anxious. Her affect is not inappropriate. Her speech is not slurred. She is not agitated, not aggressive, not hyperactive, not slowed, not withdrawn and not actively hallucinating. Cognition and memory are impaired. She expresses impulsivity. She exhibits a depressed mood. She expresses no homicidal and no suicidal ideation. She exhibits abnormal recent memory.  Fair insight and judgment. She is inattentive.  Chronically scattered style. Not agitated.  Lab Review:     Component Value Date/Time   NA 140 03/23/2016 1933   K 4.4 03/23/2016 1933   CL 106 03/23/2016 1933   CO2 27 03/23/2016 1933   GLUCOSE 143 (H) 03/23/2016 1933   GLUCOSE 154 (H) 09/01/2006 1100   BUN 23 (H) 03/23/2016 1933   CREATININE 0.98  03/23/2016 1933   CREATININE 0.93 07/19/2011 1110   CALCIUM 9.8 03/23/2016 1933   PROT 6.8 01/21/2015 0140   ALBUMIN 3.8 01/21/2015 0140   AST 26 01/21/2015 0140   ALT 25 01/21/2015 0140   ALKPHOS 100 01/21/2015 0140   BILITOT 0.6 01/21/2015 0140   GFRNONAA >60 03/23/2016 1933   GFRAA >60 03/23/2016 1933       Component Value Date/Time   WBC 7.6 03/23/2016 1933   RBC 4.50 03/23/2016 1933   HGB 13.5 03/23/2016 1933   HCT 40.8 03/23/2016 1933   PLT 266 03/23/2016 1933   MCV 90.7 03/23/2016 1933   MCH 30.0 03/23/2016 1933   MCHC 33.1 03/23/2016 1933   RDW 13.6 03/23/2016 1933   LYMPHSABS 2.5 01/21/2015 0140   MONOABS 0.4 01/21/2015 0140   EOSABS 0.2 01/21/2015 0140   BASOSABS 0.1 01/21/2015 0140    No results found for: POCLITH, LITHIUM   No results found for: PHENYTOIN, PHENOBARB, VALPROATE, CBMZ   .res Assessment: Plan:    Panic disorder with agoraphobia  Generalized anxiety disorder  [name] disorder (HCC)  Mild cognitive impairment  Low vitamin D level  Please see After Visit Summary for patient specific instructions.  Greater than 50% of face to face time with patient was spent on counseling and coordination of care. We discussed her chronic TR GAD and panic disorder and her worsening fear of dementia.  Results pending from Dr. Marcos Eke.  She's failed multiple meds for anxiety, so there are limited options and the meds that could be used have cognitive risks.  We may consider trying again low-dose SSRI such as sertraline to try to bring down her anxiety a little more but this has a risk of causing mood cycling.  Hx intolerance of Aricept and Namenda.  Consider Exelon  patch.  She tries to skip Xanax at 5 pm unless anxiety is unmanageable.  Reviewed note from Dr. Marcos Eke with patient.  Dr. Marisue Humble ordered the testing.  Will get the note and labs to ensure that a dementia workup has been done and pursue it if not.    She also needs a repeat vitamin D level but  will defer that until receive outside labs.  This appt was 30 mins  FU 2 mos  [name] MD, DFAPA  Future Appointments  Date Time Provider [place]  06/28/2018  2:15 PM Trula Slade, DPM [facility]  07/05/2018 11:00 AM Kandis Nab, PsyD [facility] None  08/23/2018  2:30 PM Cottle, [place]., MD [facility] None    No orders of the defined types were placed in this encounter.     -------------------------------

## 2018-06-28 ENCOUNTER — Ambulatory Visit: Payer: Medicare Other | Admitting: Podiatry

## 2018-07-03 NOTE — Progress Notes (Signed)
NEUROPSYCHOLOGICAL EVALUATION   Name:    [name]  Date of Birth:   1950-12-05 Date of Interview:  06/18/2018 Date of Testing:  06/18/2018   Date of Feedback:  07/05/2018       Background Information:  Reason for Referral:  [name] is a 67 y.o. female referred by [name] of [facility] to assess her current level of cognitive functioning and assist in differential diagnosis. The current evaluation consisted of a review of available medical records, an interview with the patient and her husband, and the completion of a neuropsychological testing battery. Informed consent was obtained.  History of Presenting Problem:  [name] and her husband report gradual cognitive decline over approximately the past 4-5 years. 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   The patient has a history of bipolar disorder with racing thoughts. She reports she has racing thoughts daily and incessantly. The only break from them is sleep. She sleeps a lot, some days 20 hours. She has been sleeping a lot for the past 20 years per her husband. However they also note she has episodes where she is up 3-4 days at a time and then "crashes". She is followed by psychiatry. She has been seeing [name] for many years. She has had therapy in the past but is not presently working with a therapist. She has had inpatient psychiatric hospitalizations in the past for bipolar and anxiety. They think the most recent one was around 2006. She denies suicidal ideation or intention. Her husband states she has spoken of suicidal thoughts in the past but not recently. She denies history of psychosis other than related to medication effects (lithium). She and her husband report she has very  unusual side effects to many medications.   In terms of cognitive functioning, she has difficulty concentrating and staying on task due to racing thoughts. She often has trouble finishing a sentence, per her husband. He also reports concerns about her driving due to her inability to concentrate.   Upon direct questioning, the patient and her husband reported the following with regard to current cognitive functioning:   Forgetting recent conversations/events: Yes Repeating statements/questions: Yes Misplacing/losing items: Yes Forgetting appointments or other obligations: Yes (husband has to manage) Forgetting to take medications: Yes (husband had to take over)  Word-finding difficulty: Yes Comprehension difficulty: Yes  Getting lost when driving: No Uncertain about directions when driving or passenger: Yes  She lives with her husband. He manages all instrumental ADLs for her (medications, appointments, finances/bills, meals, and most of the driving); she does drive on rare occasions. Her husband does not want her to drive, however. She has not worked since 2003. When she was working she did not have any cognitive problems that interfered with her work.   With regard to current mood the patient reports she has more anxiety than depression. She does experience depression some days. She reports anhedonia and loss of interest. She used to enjoy working in the yard and now she doesn't care to. She reports significant, longstanding negative thoughts about herself. She reports "I don't like myself, I never have". Her husband states she has an "inferiority complex". He also states she is "narcissistic".   Physically, she reports feeling "  too drugged". She had one fall a little while ago when she tripped on her husband's cane. Otherwise she has not had any history of falls. She has sleep apnea and uses her CPAP. She does not drink alcohol. She denies history of substance  abuse/dependence.  There is no known family history of dementia in her biological family. There is history of mood disorder in her biological family, and her daughter has bipolar disorder as well.    Social History: Born/Raised: She is adopted. She was raised in [place]. She did meet her birth family later in life. 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 Marital history: Married since 4. They have two children (son age 85, daughter age 59) and one granddaughter.  Alcohol: None, never a drinker Tobacco: None, never a smoker   Medical History:  Past Medical History:  Diagnosis Date  . Anxiety   . Arthritis   . Asthma   . Bipolar 1 disorder ([place])   . CHF (congestive heart failure) ([place])   . Complication of anesthesia    left a bad taste in my mouth it has lasted since january   . Depression   . Diabetes mellitus   . Hernia    umbilical  . Hyperlipidemia   . Hypertension   . Hypothyroidism   . Pericarditis, viral 2010 October  . Sleep apnea    uses cpap setting of 15    Current medications:  Outpatient Encounter Medications as of 07/05/2018  Medication Sig  . ALPRAZolam (XANAX XR) 0.5 MG 24 hr tablet Take 0.5 mg by mouth 3 (three) times daily as needed for anxiety.  [name] aspirin 81 MG tablet Take 81 mg by mouth daily.    . busPIRone (BUSPAR) 30 MG tablet Take 30 mg by mouth 2 (two) times daily.  . cephALEXin (KEFLEX) 500 MG capsule Take 1 capsule (500 mg total) by mouth 4 (four) times daily.  [name] esomeprazole (NEXIUM) 40 MG capsule Take 1 capsule (40 mg total) by mouth daily.  [name] etodolac (LODINE) 400 MG tablet Take 400 mg by mouth 2 (two) times daily.  . furosemide (LASIX) 20 MG tablet Take 20 mg by mouth daily as needed (for severe swelling).   [name] lamoTRIgine (LAMICTAL) 150 MG tablet Take 150 mg by mouth 2 (two) times daily.   [name] levocetirizine (XYZAL) 5 MG tablet Take  5 mg by mouth every evening.  [name] levothyroxine (SYNTHROID, LEVOTHROID) 75 MCG tablet Take 75 mcg by mouth daily.  [name] lisinopril (PRINIVIL,ZESTRIL) 20 MG tablet Take 20 mg by mouth daily.  . metoprolol succinate (TOPROL-XL) 50 MG 24 hr tablet Take 50 mg by mouth daily. Take with or immediately following a meal.  . phenazopyridine (PYRIDIUM) 200 MG tablet Take 200 mg by mouth 3 (three) times daily as needed for pain.   . polyethylene glycol (MIRALAX / GLYCOLAX) packet Take 17 g by mouth 2 (two) times daily as needed for mild constipation.  . potassium chloride SA (K-DUR,KLOR-CON) 20 MEQ tablet Take 20 mEq by mouth daily.  . QUEtiapine (SEROQUEL XR) 400 MG 24 hr tablet Take 800 mg by mouth at bedtime.  [name] QUEtiapine (SEROQUEL) 200 MG tablet Take 200 mg by mouth at bedtime as needed (sleep).  . simvastatin (ZOCOR) 40 MG tablet Take 40 mg by mouth daily.   . Vitamin D, Ergocalciferol, (DRISDOL) 50000 units CAPS capsule Take 50,000 Units by mouth 2 (two) times  a week.    No facility-administered encounter medications on file as of 07/05/2018.      Current Examination:  Behavioral Observations:  Appearance: Neatly and appropriately dressed and groomed Gait: Ambulated independently, no gross abnormalities observed Speech: Generally fluent; mildly reduced volume, mild dyarthria. No significant word finding difficulty observed. Thought process: Tangential Affect: Full range, labile, bursts into tears episodically Interpersonal: Pleasant, appropriate Orientation: Oriented to person, place and most aspects of time (did not know current date only). Inaccurately named the current President as "Clinton" but accurately named the most recent predecessor.    Tests Administered: . Test of Premorbid Functioning (TOPF) . Wechsler Adult Intelligence Scale-Fourth Edition (WAIS-IV): Similarities, Music therapist, Coding and Digit Span subtests . Wechsler Memory Scale-Fourth Edition (WMS-IV) Older Adult Version  (ages 70-90): Logical Memory I, II and Recognition subtests  . [employer] Verbal Learning Test - 2nd Edition (CVLT-2) Short Form . Repeatable Battery for the Assessment of Neuropsychological Status (RBANS) Form A:  Figure Copy and Recall subtests and Semantic Fluency subtest . Boston Naming Test (BNT) . Boston Diagnostic Aphasia Examination: Complex Ideational Material subtest . Controlled Oral Word Association Test (COWAT) . Trail Making Test A and B . Clock drawing test . Beck Depression Inventory - 2nd Edition (BDI-II) . Generalized Anxiety Disorder - 7 item screener (GAD-7)  Test Results: Note: Standardized scores are presented only for use by appropriately trained professionals and to allow for any future test-retest comparison. These scores should not be interpreted without consideration of all the information that is contained in the rest of the report. The most recent standardization samples from the test publisher or other sources were used whenever possible to derive standard scores; scores were corrected for age, gender, ethnicity and education when available.   Test Scores:  Test Name Raw Score Standardized Score Descriptor  TOPF 26/70 SS= 86 Low average  WAIS-IV Subtests     Similarities 15/36 ss= 6 Low average  Block Design 6/66 ss= 2 Impaired  Coding 46/135 ss= 8 Low end of average  Digit Span Forward 5/16 ss= 4 Impaired  Digit Span Backward 4/16 ss= 5 Borderline  WMS-IV Subtests     LM I 9/53 ss= 2 Impaired  LM II 0/39 ss= 1 Impaired  LM II Recognition 14/23 Cum %: <2 Severely impaired  RBANS Subtests     Figure Copy 12/20 Z= -3.6 Severely impaired  Figure Recall 3/20 Z= -2.7 Severely impaired  Semantic Fluency 12 Z= -2 Impaired  CVLT-II Scores     Trial 1 1/9 Z= -3 Severely impaired  Trial 4 3/9 Z= -3 Severely impaired  Trials 1-4 total 7/36 T= 10 Severely impaired  SD Free Recall 2/9 Z= -3 Severely impaired  LD Free Recall 0/9 Z= -2.5 Impaired  LD Cued Recall  5/9 Z= -1.5 Borderline  Recognition Discriminability 9/9 hits 6 false positives Z= -1.5 Borderline  Forced Choice Recognition 9/9  WNL  BNT 41/60 T= 8 Severely impaired  BDAE Complex Ideational Material 6/12  Severely impaired  COWAT-FAS 15 T= 34 Borderline  COWAT-Animals 7 T= 28 Impaired  Trail Making Test A  88" 1 error T= 6 Severely impaired  Trail Making Test B  Pt unable     Clock Drawing   Impaired  BDI-II 48/63  Severe  GAD-7 18/21  Severe      Description of Test Results:  Embedded performance validity indicators (e.g., Reliable Digit Span) suggested suboptimal level of effort likely due to significant problems maintaining attention. The patient reported this  was due to severe racing thoughts. Overall, this suggests psychological interference in test performance, and the following description of test performances must be interpreted within this context.   Premorbid verbal intellectual abilities were estimated to have been within the low average range based on a test of word reading.   Psychomotor processing speed was variable, ranging from average to impaired.   Auditory attention and working memory were impaired. She could only repeat three digits forward accurately and two digits backwards accurately.   Visual-spatial construction was impaired across several tasks. Her drawn copy of a complex geometric figure revealed appreciation for the gestalt but poor attention to detail.   Performance on tests of various language abilities was below expectation. Specifically, confrontation naming was severely impaired, and semantic verbal fluency was impaired. Auditory comprehension of complex ideational material was severely impaired.   With regard to verbal memory, encoding and acquisition of non-contextual information (i.e., word list) was severely impaired. The patient often stated, "I can't! I have racing thoughts!" After a brief distracter task, free recall was severely  impaired (2/9 items). After a delay, free recall was impaired (0/9 items). Cued recall was borderline (5/9 items). Performance on a yes/no recognition task was borderline. On another verbal memory test, encoding and acquisition of contextual auditory information (i.e., short stories) was impaired. After a delay, free recall was severely impaired. Performance on a yes/no recognition task was severely impaired. With regard to non-verbal memory, delayed free recall of visual information was severely impaired.   Executive functioning was variable. Mental flexibility and set-shifting were impaired; she was unable to complete Trails B. Verbal fluency with phonemic search restrictions was borderline impaired. Verbal abstract reasoning was low average. Performance on a clock drawing task was severely impaired with very poor planning and organization.   On a self-report measure of mood, the patient's responses were indicative of clinically significant depression, in the severe range at the present time. Symptoms endorsed included: sadness, hopelessness, feeling of failure, anhedonia, guilty feelings, self-dislike, self-criticalness, tearfulness, restlessness, loss of interest, indecisiveness, worthlessness, loss of energy, sleep impairment, irritability, food cravings, concentration difficulty. She declined to answer a question related to suicidal ideation/intention. On a self-report measure of anxiety, the patient endorsed clinically significant generalized anxiety, characterized by severe nervousness, excessive worries, inability to control worrying, difficulty relaxing, irritability and fear of something awful happening.    Clinical Impressions: Cognitive complaints are likely due to underlying psychiatric disorder (bipolar disorder/anxiety disorder with racing thoughts). Diagnostic impressions based on test performances are limited due to the patient's severe inability to fully attend to the tasks. However, based  on clinical presentation, I highly doubt the presence of a neurodegenerative dementia. It is much more likely that her racing thoughts and psychiatric disorders are interfering with cognitive efficiency and greatly reducing her ability to focus and process information. Unfortunately the patient's psychiatric symptoms of bipolar disorder and anxiety disorders have been largely treatment resistant. Alternative treatment methods may need to be explored, such as [place] or IOP program. Of course, all decisions with regard to psychiatric treatment are deferred to her psychiatrist.   Feedback to Patient: CORY RAMA and her husband returned for a feedback appointment on 07/05/2018 to review the results of her neuropsychological evaluation with this provider. 30 minutes face-to-face time was spent reviewing her test results, my impressions and my recommendations as detailed above. It was explained that why she is certainly experiencing severe cognitive problems in daily life, it is more likely her inability to concentrate and  attend to information (due to psychiatric symptoms) is the problem than any underlying neurodegenerative disorder (such as AD). She will continue to follow up with her psychiatrist, and she requested that a copy of this report be sent to him.   Total time spent on this patient's case: 95 minutes for neurobehavioral status exam with psychologist (CPT code [id number], [phone]); 90 minutes of testing/scoring by psychometrician under psychologist's supervision (CPT codes [phone], [phone] units); 180 minutes for integration of patient data, interpretation of standardized test results and clinical data, clinical decision making, treatment planning and preparation of this report, and interactive feedback with review of results to the patient/family by psychologist (CPT codes [phone], [phone] units).      Thank you for your referral of Gursimran Litaker Mayweather. Please feel free to contact me if you have any  questions or concerns regarding this report.

## 2018-07-05 ENCOUNTER — Ambulatory Visit (INDEPENDENT_AMBULATORY_CARE_PROVIDER_SITE_OTHER): Payer: Medicare Other | Admitting: Psychology

## 2018-07-05 ENCOUNTER — Encounter: Payer: Self-pay | Admitting: Psychology

## 2018-07-05 DIAGNOSIS — R413 Other amnesia: Secondary | ICD-10-CM

## 2018-07-05 DIAGNOSIS — F3160 Bipolar disorder, current episode mixed, unspecified: Secondary | ICD-10-CM

## 2018-07-10 ENCOUNTER — Ambulatory Visit: Payer: Medicare Other | Admitting: Podiatry

## 2018-07-10 DIAGNOSIS — E1142 Type 2 diabetes mellitus with diabetic polyneuropathy: Secondary | ICD-10-CM | POA: Diagnosis not present

## 2018-07-10 DIAGNOSIS — B351 Tinea unguium: Secondary | ICD-10-CM | POA: Diagnosis not present

## 2018-07-10 DIAGNOSIS — M79674 Pain in right toe(s): Secondary | ICD-10-CM | POA: Diagnosis not present

## 2018-07-10 DIAGNOSIS — L84 Corns and callosities: Secondary | ICD-10-CM | POA: Diagnosis not present

## 2018-07-10 DIAGNOSIS — M79675 Pain in left toe(s): Secondary | ICD-10-CM | POA: Diagnosis not present

## 2018-07-10 NOTE — Patient Instructions (Signed)

## 2018-07-26 ENCOUNTER — Encounter: Payer: Medicare Other | Admitting: Psychology

## 2018-07-30 ENCOUNTER — Encounter: Payer: Self-pay | Admitting: Podiatry

## 2018-07-30 NOTE — Progress Notes (Signed)
Subjective: [name] presents with diabetes with cc of painful, discolored, thick toenails and painful calluses which interfere with activities of daily living. Pain is aggravated when wearing enclosed shoe gear. Pain is relieved with periodic professional debridement.   Current Outpatient Medications:  .  ALPRAZolam (XANAX XR) 0.5 MG 24 hr tablet, Take 0.5 mg by mouth 3 (three) times daily as needed for anxiety., Disp: , Rfl:  .  ALPRAZolam (XANAX) 0.5 MG tablet, , Disp: , Rfl:  .  amoxicillin (AMOXIL) 875 MG tablet, TAKE 1 TABLET BY MOUTH TWICE A DAY FOR 5 DAYS, Disp: , Rfl:  .  aspirin 81 MG tablet, Take 81 mg by mouth daily.  , Disp: , Rfl:  .  Aspirin-Calcium Carbonate 81-777 MG TABS, Take by mouth., Disp: , Rfl:  .  busPIRone (BUSPAR) 30 MG tablet, Take 30 mg by mouth 2 (two) times daily., Disp: , Rfl:  .  cefpodoxime (VANTIN) 100 MG tablet, Take 100 mg by mouth 2 (two) times daily., Disp: , Rfl: 0 .  cephALEXin (KEFLEX) 500 MG capsule, Take 1 capsule (500 mg total) by mouth 4 (four) times daily. (Patient taking differently: Take 250 mg by mouth 4 (four) times daily. ), Disp: 20 capsule, Rfl: 0 .  ciprofloxacin (CIPRO) 250 MG tablet, Take 250 mg by mouth 2 (two) times daily., Disp: , Rfl: 0 .  esomeprazole (NEXIUM) 40 MG capsule, Take 1 capsule (40 mg total) by mouth daily., Disp: 30 capsule, Rfl: 0 .  etodolac (LODINE) 400 MG tablet, Take 400 mg by mouth 2 (two) times daily., Disp: , Rfl:  .  furosemide (LASIX) 20 MG tablet, Take 20 mg by mouth daily as needed (for severe swelling). , Disp: , Rfl:  .  lamoTRIgine (LAMICTAL) 150 MG tablet, Take 150 mg by mouth 2 (two) times daily. , Disp: , Rfl:  .  levocetirizine (XYZAL) 5 MG tablet, Take 5 mg by mouth every evening., Disp: , Rfl:  .  levothyroxine (SYNTHROID, LEVOTHROID) 75 MCG tablet, Take 75 mcg by mouth daily., Disp: , Rfl:  .  lisinopril (PRINIVIL,ZESTRIL) 20 MG tablet, Take 20 mg by mouth daily., Disp: , Rfl:  .   Methylfol-Methylcob-Acetylcyst (L-METHYL-MC NAC) 6-2-600 MG TABS, TK 1 T PO QD, Disp: , Rfl:  .  metoprolol succinate (TOPROL-XL) 50 MG 24 hr tablet, Take 50 mg by mouth daily. Take with or immediately following a meal., Disp: , Rfl:  .  phenazopyridine (PYRIDIUM) 200 MG tablet, Take 200 mg by mouth 3 (three) times daily as needed for pain. , Disp: , Rfl:  .  polyethylene glycol (MIRALAX / GLYCOLAX) packet, Take 17 g by mouth 2 (two) times daily as needed for mild constipation., Disp: , Rfl:  .  potassium chloride SA (K-DUR,KLOR-CON) 20 MEQ tablet, Take 20 mEq by mouth daily., Disp: , Rfl:  .  QUEtiapine (SEROQUEL XR) 400 MG 24 hr tablet, Take 800 mg by mouth at bedtime., Disp: , Rfl:  .  QUEtiapine (SEROQUEL) 200 MG tablet, Take 200 mg by mouth at bedtime as needed (sleep)., Disp: , Rfl:  .  simvastatin (ZOCOR) 40 MG tablet, Take 40 mg by mouth daily. , Disp: , Rfl:  .  Vitamin D, Ergocalciferol, (DRISDOL) 50000 units CAPS capsule, Take 50,000 Units by mouth 2 (two) times a week. , Disp: , Rfl:   Allergies  Allergen Reactions  . Celexa [Citalopram Hydrobromide] Nausea Only  . Maxzide [Triamterene-Hctz]     Hurt patients kidneys  . Metformin And  Related Nausea And Vomiting  . Other Nausea And Vomiting, Nausea Only and Other (See Comments)    Hurt patients kidneys  . Reglan [Metoclopramide] Nausea Only  . Risperdal [Risperidone] Nausea Only    Also causes hallucinations  . Trazodone And Nefazodone Nausea Only  . Zestril [Lisinopril] Nausea Only  . Lithium Palpitations and Other (See Comments)    Shakes and delusional pt started seeing things      Vascular Examination: Capillary refill time <3 seconds x 10 digits Dorsalis pedis and Posterior tibial pulses present b/l No digital hair x 10 digits Skin temperature WNL b/l  Dermatological Examination: Skin with normal turgor, texture and tone b/l  Toenails 1-5 b/l discolored, thick, dystrophic with subungual debris and pain with  palpation to nailbeds due to thickness of nails.  Hyperkeratotic lesion plantarmedial hallux b/l with no erythema, no edema, no flocculence.  Musculoskeletal: Muscle strength 5/5 to all LE muscle groups  Neurological: Sensation diminished with 10 gram monofilament.   Assessment: 1. Painful onychomycosis toenails 1-5 b/l 2. Callus b/l hallux IPJ 3. NIDDM with neuropathy  Plan: 1. Continue diabetic foot care principles.  2. Toenails 1-5 b/l were debrided in length and girth without iatrogenic bleeding. 3. Hyperkeratotic lesion pared with sterile chisel blade b/l hallux 4. Patient to continue soft, supportive shoe gear 5. Patient to report any pedal injuries to medical professional  6. Follow up 3 months. Patient/POA to call should there be a concern in the interim.

## 2018-08-23 ENCOUNTER — Ambulatory Visit: Payer: Medicare Other | Admitting: Psychiatry

## 2018-08-27 ENCOUNTER — Ambulatory Visit: Payer: Medicare Other | Admitting: Psychiatry

## 2018-08-27 ENCOUNTER — Encounter: Payer: Self-pay | Admitting: Psychiatry

## 2018-08-27 DIAGNOSIS — G3184 Mild cognitive impairment, so stated: Secondary | ICD-10-CM

## 2018-08-27 DIAGNOSIS — F4001 Agoraphobia with panic disorder: Secondary | ICD-10-CM

## 2018-08-27 DIAGNOSIS — F411 Generalized anxiety disorder: Secondary | ICD-10-CM | POA: Diagnosis not present

## 2018-08-27 DIAGNOSIS — F3181 Bipolar II disorder: Secondary | ICD-10-CM | POA: Diagnosis not present

## 2018-08-27 DIAGNOSIS — R7989 Other specified abnormal findings of blood chemistry: Secondary | ICD-10-CM
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
## 2018-09-19 ENCOUNTER — Encounter: Payer: Self-pay | Admitting: Emergency Medicine

## 2018-09-19 DIAGNOSIS — F411 Generalized anxiety disorder: Secondary | ICD-10-CM | POA: Insufficient documentation

## 2018-09-19 DIAGNOSIS — F3181 Bipolar II disorder: Secondary | ICD-10-CM

## 2018-09-19 DIAGNOSIS — F410 Panic disorder [episodic paroxysmal anxiety] without agoraphobia: Secondary | ICD-10-CM | POA: Insufficient documentation

## 2018-10-05 ENCOUNTER — Telehealth: Payer: Self-pay | Admitting: Psychiatry

## 2018-10-05 NOTE — Telephone Encounter (Signed)
Patient's husband called and said that his wife cognitive thinking is really bad. She is unable to concentrate. Her racing thoughts are bad. She doesn't know how to turn on the faucet when it is already turned on. What can be done? Do you want to see her sooner? Her appoint is not until April.Please call her husband Jeneen Rinks at [phone]

## 2018-10-08 ENCOUNTER — Other Ambulatory Visit: Payer: Self-pay | Admitting: Family Medicine

## 2018-10-08 ENCOUNTER — Ambulatory Visit
Admission: RE | Admit: 2018-10-08 | Discharge: 2018-10-08 | Disposition: A | Discharge status: Home / Self Care | Payer: Medicare Other | Source: Ambulatory Visit | Attending: Family Medicine | Admitting: Family Medicine

## 2018-10-08 DIAGNOSIS — R410 Disorientation, unspecified: Secondary | ICD-10-CM

## 2018-10-10 ENCOUNTER — Ambulatory Visit: Payer: Self-pay | Admitting: Podiatry

## 2018-10-12 ENCOUNTER — Ambulatory Visit: Payer: Medicare Other | Admitting: Psychiatry

## 2018-10-12 ENCOUNTER — Encounter: Payer: Self-pay | Admitting: Psychiatry

## 2018-10-12 DIAGNOSIS — G3184 Mild cognitive impairment, so stated: Secondary | ICD-10-CM | POA: Diagnosis not present

## 2018-10-12 DIAGNOSIS — F4001 Agoraphobia with panic disorder: Secondary | ICD-10-CM

## 2018-10-12 DIAGNOSIS — R7989 Other specified abnormal findings of blood chemistry: Secondary | ICD-10-CM

## 2018-10-12 DIAGNOSIS — F411 Generalized anxiety disorder: Secondary | ICD-10-CM

## 2018-10-12 DIAGNOSIS — F05 Delirium due to known physiological condition: Secondary | ICD-10-CM

## 2018-10-12 DIAGNOSIS — N390 Urinary tract infection, site not specified: Secondary | ICD-10-CM

## 2018-10-12 DIAGNOSIS — F3181 Bipolar II disorder: Secondary | ICD-10-CM

## 2018-10-12 MED ORDER — LORAZEPAM 0.5 MG PO TABS: 0.5000 mg | ORAL_TABLET | Freq: Three times a day (TID) | ORAL | 1 refills | Status: DC | PRN
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
## 2018-10-12 NOTE — Patient Instructions (Addendum)
Reduce lamotrigine to 1/2 tablet in the AM and 1 tablet at night for 2 weeks then 1 at night.  Stop alprazolam (Xanax) and start lorazepam in it's place 0.5 mg 1 tablet twice daily and if needed for anxiety add 1 more daily.  Go to [facility] and get Urinalysis

## 2018-10-17 ENCOUNTER — Telehealth: Payer: Self-pay | Admitting: Psychiatry

## 2018-10-17 NOTE — Telephone Encounter (Signed)
The headaches are a temporary adjustment from the switch from Xanax 0.5 mg to lorazepam 0.5 mg.  It will go away on its own in a few days however, to make it go away faster she can take one half of a 0.5 mg Xanax twice a day morning and about 5 PM for 3 days and then stop it.  That should make the headaches go away more quickly.  [name], MD, DFAPA

## 2018-10-17 NOTE — Telephone Encounter (Signed)
She did take one this afternoon of the xanax and it helped her headache. Spoke with husband on phone and given instructions since he sets up her medications. Instructed to call back if not any better.

## 2018-10-17 NOTE — Telephone Encounter (Signed)
Pt started on ativan Friday. She is having really bad headaches. They started on second day. Would like a call back.

## 2018-10-23 ENCOUNTER — Telehealth: Payer: Self-pay | Admitting: Psychiatry

## 2018-10-23 NOTE — Telephone Encounter (Signed)
Still having headaches all day everyday per husband. He does feel like her memory is better then before the switch.

## 2018-10-23 NOTE — Telephone Encounter (Signed)
I think she is still having some Xanax withdrawal and the switched to lorazepam.  This switch was made for the purpose of hopefully reducing her confusion and memory impairment.  Before a further decision is made on medication changes, please ask patient's husband, [name], whether she is less confused has a better memory since the switch to lorazepam or not?  The answer that question will determine the next step.

## 2018-10-23 NOTE — Telephone Encounter (Signed)
Pt has been following the instructions that Dr Clovis Pu gave her about her drug change, but she is still having bad headaches. She wants to know what can she do.

## 2018-10-23 NOTE — Telephone Encounter (Signed)
I think the headaches are related to Bz withdrawal.  Encouraged her cognition is better.  Eventually the headaches will resolve but to help this get better faster then increase lorazepam to 0.5mg  with each meal (TID).  [name], MD, DFAPA

## 2018-10-24 NOTE — Telephone Encounter (Signed)
Spoke with husband and given instructions on the lorazepam. Verbalized understanding. Also to let provider know she has appt at [facility] on Friday 10/26/2018

## 2018-10-26 ENCOUNTER — Encounter: Payer: Self-pay | Admitting: Neurology

## 2018-10-26 ENCOUNTER — Other Ambulatory Visit (INDEPENDENT_AMBULATORY_CARE_PROVIDER_SITE_OTHER): Payer: Medicare Other

## 2018-10-26 ENCOUNTER — Ambulatory Visit: Payer: Medicare Other | Admitting: Neurology

## 2018-10-26 ENCOUNTER — Other Ambulatory Visit: Payer: Self-pay

## 2018-10-26 VITALS — BP 146/82 | HR 83 | Ht 65.0 in | Wt 280.0 lb

## 2018-10-26 DIAGNOSIS — R413 Other amnesia: Secondary | ICD-10-CM

## 2018-10-26 DIAGNOSIS — F3160 Bipolar disorder, current episode mixed, unspecified: Secondary | ICD-10-CM | POA: Diagnosis not present

## 2018-10-26 NOTE — Progress Notes (Signed)
NEUROLOGY CONSULTATION NOTE  [name] MRN: [id number] DOB: 1951-05-05  Referring provider: Dr. Gaynelle Arabian Primary care provider: Dr. Gaynelle Arabian  Reason for consult:  Memory loss  Dear Dr Marisue Humble:  Thank you for your kind referral of [name] for consultation of the above symptoms. Although her history is well known to you, please allow me to reiterate it for the purpose of our medical record. The patient was accompanied to the clinic by her husband who also provides collateral information. Records and images were personally reviewed where available.  HISTORY OF PRESENT ILLNESS: This is a pleasant 68 year old right-handed woman with a history of hypertension, hyperlipidemia, diabetes, hypothyroidism, bipolar disorder, anxiety, presenting for evaluation of memory loss. She started noticing changes a year or so ago, her husband would tell her she has the worst cognitive thinking. She has difficulties completing her thoughts, several times during the visit she would stop mid-sentence and say "I'll tell you in a minute." She feels that after her BP medication was changed, all of a sudden her memory got bad, she was babbling and had a head CT which was normal. She notices difficulty getting her words out. She knows something is wrong and would start crying. She states "I've always had a problem with remembering and focusing even as a child." She states her husband does everything for her due to memory issues. She stopped driving a year ago because "the car is too big." She denied getting lost driving. Her husband has been managing bills and medications since 1983 when she had a nervous breakdown. She denies leaving the stove on. Yesterday she was cleaning up and found she left the Windex inside the fridge. Her husband started noticing memory changes worse in the past 2 years where she would have trouble completing a sentence. He thinks it is because she has racing thoughts, she  would be in a different story within 10 minutes. She has always had racing thoughts but this has worsened. When the episode occurred last month, she called him from the shower and could not figure it out, water was running and she told him she did not know how to turn it on. She was spaced out all day, worse when she got more anxious. 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 She could not figure out how to get her clothes on. This lasted 2 days, since then she has been back to her baseline. Her hsuband reports that she had back surgery and a mass in her kidney 2 years ago and symptoms drastically got worse that time when she thought she had cancer, she has not gotten back to where she was prior to 2 years ago. Her metoprolol had just been increased, they were instructed to reduce dose back. She saw her psychiatrist and reports that Xanax was switched to Ativan. She has been having more headaches with Ativan. She states 2 days ago was the best she felt, this lasted 2-3 days. She is also weaning off Lamotrigine.   She denies any dizziness, vision changes, focal numbness/tingling/weakness, neck/back pain, anosmia. She has IBS and frequent UTIs. She sleeps up to 17 hours at night ("when you have Bipolar, your relief is your sleep"). She can be up for 24 hours when she is worried about something. In the past she would be up 1-2 nights, nothing recently. No hallucinations. She has been paranoid "all  her life." No neurological issues in her biological family, there is a strong family history of mental health disorders.   She had Neurocognitive testing with Dr. Si Raider in November 2019 with impression that cognitive complaints are likely due to underlying psychiatric disorder (bipolar disorder/anxiety disorder with racing thoughts). She was noted to have severe inability to fully attend to tasks during testing, but based on  presentation, the presence of a neurodegenerative dementia was highly doubted.   PAST MEDICAL HISTORY: Past Medical History:  Diagnosis Date  . Anxiety   . Arthritis   . Asthma   . Bipolar 1 disorder ([place])   . CHF (congestive heart failure) ([place])   . Complication of anesthesia    left a bad taste in my mouth it has lasted since january   . Depression   . Diabetes mellitus   . Hernia    umbilical  . Hyperlipidemia   . Hypertension   . Hypothyroidism   . Pericarditis, viral 2010 October  . Sleep apnea    uses cpap setting of 15    PAST SURGICAL HISTORY: Past Surgical History:  Procedure Laterality Date  . ABDOMINAL HYSTERECTOMY    . APPENDECTOMY    . CHOLECYSTECTOMY  1984   open  . COLONOSCOPY WITH PROPOFOL N/A 05/20/2014   Procedure: COLONOSCOPY WITH PROPOFOL;  Surgeon: Lear Ng, MD;  Location: [facility];  Service: Endoscopy;  Laterality: N/A;  . ESOPHAGOGASTRODUODENOSCOPY (EGD) WITH PROPOFOL N/A 05/20/2014   Procedure: ESOPHAGOGASTRODUODENOSCOPY (EGD) WITH PROPOFOL;  Surgeon: Lear Ng, MD;  Location: [facility];  Service: Endoscopy;  Laterality: N/A;  . FOOT SURGERY     bone spurs   . KNEE ARTHROSCOPY  09/07/2012   Procedure: ARTHROSCOPY KNEE;  Surgeon: Newt Minion, MD;  Location: [facility];  Service: Orthopedics;  Laterality: Left;  Left Knee Arthroscopy    MEDICATIONS: Current Outpatient Medications on File Prior to Visit  Medication Sig Dispense Refill  . ALPRAZolam (XANAX XR) 0.5 MG 24 hr tablet Take 0.5 mg by mouth 3 (three) times daily as needed for anxiety.    [name] amoxicillin (AMOXIL) 875 MG tablet TAKE 1 TABLET BY MOUTH TWICE A DAY FOR 5 DAYS    . aspirin 81 MG tablet Take 81 mg by mouth daily.      . Aspirin-Calcium Carbonate 81-777 MG TABS Take by mouth.    . busPIRone (BUSPAR) 30 MG tablet Take 30 mg by mouth 2 (two) times daily.    . cefpodoxime (VANTIN) 100 MG tablet Take 100 mg by mouth 2 (two) times daily.  0  . cephALEXin (KEFLEX)  500 MG capsule Take 1 capsule (500 mg total) by mouth 4 (four) times daily. (Patient taking differently: Take 250 mg by mouth 4 (four) times daily. ) 20 capsule 0  . ciprofloxacin (CIPRO) 250 MG tablet Take 250 mg by mouth 2 (two) times daily.  0  . esomeprazole (NEXIUM) 40 MG capsule Take 1 capsule (40 mg total) by mouth daily. 30 capsule 0  . etodolac (LODINE) 400 MG tablet Take 400 mg by mouth 2 (two) times daily.    . furosemide (LASIX) 20 MG tablet Take 20 mg by mouth daily as needed (for severe swelling).     [name] lamoTRIgine (LAMICTAL) 150 MG tablet Take 150 mg by mouth 2 (two) times daily.     [name] levocetirizine (XYZAL) 5 MG tablet Take 5 mg by mouth every evening.    [name] levothyroxine (SYNTHROID, LEVOTHROID) 75 MCG tablet Take 75 mcg  by mouth daily.    [name] lisinopril (PRINIVIL,ZESTRIL) 20 MG tablet Take 20 mg by mouth daily.    [name] LORazepam (ATIVAN) 0.5 MG tablet Take 1 tablet (0.5 mg total) by mouth every 8 (eight) hours as needed for anxiety. 90 tablet 1  . Methylfol-Methylcob-Acetylcyst (L-METHYL-MC NAC) 6-2-600 MG TABS TK 1 T PO QD    . metoprolol succinate (TOPROL-XL) 50 MG 24 hr tablet Take 50 mg by mouth daily. Take with or immediately following a meal.    . phenazopyridine (PYRIDIUM) 200 MG tablet Take 200 mg by mouth 3 (three) times daily as needed for pain.     . polyethylene glycol (MIRALAX / GLYCOLAX) packet Take 17 g by mouth 2 (two) times daily as needed for mild constipation.    . potassium chloride SA (K-DUR,KLOR-CON) 20 MEQ tablet Take 20 mEq by mouth daily.    . QUEtiapine (SEROQUEL XR) 400 MG 24 hr tablet Take 800 mg by mouth at bedtime.    [name] QUEtiapine (SEROQUEL) 200 MG tablet Take 200 mg by mouth at bedtime as needed (sleep).    . simvastatin (ZOCOR) 40 MG tablet Take 40 mg by mouth daily.     . Vitamin D, Ergocalciferol, (DRISDOL) 50000 units CAPS capsule Take 50,000 Units by mouth 2 (two) times a week.      No current facility-administered medications on file prior to visit.      ALLERGIES: Allergies  Allergen Reactions  . Celexa [Citalopram Hydrobromide] Nausea Only  . Maxzide [Triamterene-Hctz]     Hurt patients kidneys  . Metformin And Related Nausea And Vomiting  . Other Nausea And Vomiting, Nausea Only and Other (See Comments)    Hurt patients kidneys  . Reglan [Metoclopramide] Nausea Only  . Risperdal [Risperidone] Nausea Only    Also causes hallucinations  . Trazodone And Nefazodone Nausea Only  . Zestril [Lisinopril] Nausea Only  . Lithium Palpitations and Other (See Comments)    Shakes and delusional pt started seeing things      FAMILY HISTORY: Family History  Problem Relation Age of Onset  . Diabetes Mother   . Hypertension Mother   . Hyperlipidemia Mother   . Diabetes Father   . Hypertension Father   . Hyperlipidemia Father   . Hypertension Sister     SOCIAL HISTORY: Social History   Socioeconomic History  . Marital status: Married    Spouse name: Not on file  . Number of children: Not on file  . Years of education: Not on file  . Highest education level: Not on file  Occupational History  . Not on file  Social Needs  . Financial resource strain: Not on file  . Food insecurity:    Worry: Not on file    Inability: Not on file  . Transportation needs:    Medical: Not on file    Non-medical: Not on file  Tobacco Use  . Smoking status: Never Smoker  . Smokeless tobacco: Never Used  Substance and Sexual Activity  . Alcohol use: No  . Drug use: No  . Sexual activity: Not on file  Lifestyle  . Physical activity:    Days per week: Not on file    Minutes per session: Not on file  . Stress: Not on file  Relationships  . Social connections:    Talks on phone: Not on file    Gets together: Not on file    Attends religious service: Not on file    Active member of club  or organization: Not on file    Attends meetings of clubs or organizations: Not on file    Relationship status: Not on file  . Intimate partner  violence:    Fear of current or ex partner: Not on file    Emotionally abused: Not on file    Physically abused: Not on file    Forced sexual activity: Not on file  Other Topics Concern  . Not on file  Social History Narrative  . Not on file    REVIEW OF SYSTEMS: Constitutional: No fevers, chills, or sweats, no generalized fatigue, change in appetite Eyes: No visual changes, double vision, eye pain Ear, nose and throat: No hearing loss, ear pain, nasal congestion, sore throat Cardiovascular: No chest pain, palpitations Respiratory:  No shortness of breath at rest or with exertion, wheezes GastrointestinaI: No nausea, vomiting, diarrhea, abdominal pain, fecal incontinence Genitourinary:  No dysuria, urinary retention or frequency Musculoskeletal:  No neck pain, back pain Integumentary: No rash, pruritus, skin lesions Neurological: as above Psychiatric: No depression, insomnia, anxiety Endocrine: No palpitations, fatigue, diaphoresis, mood swings, change in appetite, change in weight, increased thirst Hematologic/Lymphatic:  No anemia, purpura, petechiae. Allergic/Immunologic: no itchy/runny eyes, nasal congestion, recent allergic reactions, rashes  PHYSICAL EXAM: Vitals:   10/26/18 1507  BP: (!) 146/82  Pulse: 83  SpO2: 98%   General: No acute distress Head:  Normocephalic/atraumatic Eyes: Fundoscopic exam shows bilateral sharp discs, no vessel changes, exudates, or hemorrhages Neck: supple, no paraspinal tenderness, full range of motion Back: No paraspinal tenderness Heart: regular rate and rhythm Lungs: Clear to auscultation bilaterally. Vascular: No carotid bruits. Skin/Extremities: No rash, no edema Neurological Exam: Mental status: alert and oriented to person, place, and time, no dysarthria or aphasia, Fund of knowledge is appropriate.  Recent and remote memory are impaired.  Attention and concentration are reduced. Decreased fluency. She was again noted to be  inattentive during testing. Montreal Cognitive Assessment  10/26/2018  Visuospatial/ Executive (0/5) 2  Naming (0/3) 3  Attention: Read list of digits (0/2) 2  Attention: Read list of letters (0/1) 0  Attention: Serial 7 subtraction starting at 100 (0/3) 0  Language: Repeat phrase (0/2) 0  Language : Fluency (0/1) 0  Abstraction (0/2) 1  Delayed Recall (0/5) 0  Orientation (0/6) 6  Total 14  Adjusted Score (based on education) 15   Cranial nerves: CN I: not tested CN II: pupils equal, round and reactive to light, visual fields intact, fundi unremarkable. CN III, IV, VI:  full range of motion, no nystagmus, no ptosis CN V: facial sensation intact CN VII: upper and lower face symmetric CN VIII: hearing intact to finger rub CN IX, X: gag intact, uvula midline CN XI: sternocleidomastoid and trapezius muscles intact CN XII: tongue midline Bulk & Tone: normal, no fasciculations. Motor: 5/5 throughout with no pronator drift. Sensation: intact to light touch, cold, pin, vibration and joint position sense.  No extinction to double simultaneous stimulation.  Romberg test negative Deep Tendon Reflexes: +2 throughout, no ankle clonus Plantar responses: downgoing bilaterally Cerebellar: no incoordination on finger to nose, heel to shin. No dysdiadochokinesia Gait: narrow-based and steady, able to tandem walk adequately. Tremor:  none  IMPRESSION: This is a pleasant 68 year old right-handed woman with a history of hypertension, hyperlipidemia, diabetes, hypothyroidism, bipolar disorder, anxiety, presenting for evaluation of memory loss. Symptoms significantly worsened in September where she was more confused, this has since resolved and may have been related to medication changes. Her husband  reports that the increased cognitive changes started 2 years ago when she became very concerned she had cancer (negative testing), she has not been well since then. We discussed normal head CT and  Neuropsychological testing results that cognitive complaints are likely due to underlying psychiatric disorder (bipolar disorder/anxiety disorder with racing thoughts). She was again noted to have inability to fully attend to tasks during testing today, MOCA score 15/30. We discussed different causes of memory changes, check TSH, B12. We discussed how mood changes, anxiety, depression can cause cognitive changes, continue with follow-up with Psychiatry, she was encouraged to see a therapist as well. We discussed the importance of control of vascular risk factors, physical exercise, and brain stimulation exercises for brain health. Follow-up as needed.  Thank you for allowing me to participate in the care of this patient. Please do not hesitate to call for any questions or concerns.   Ellouise Newer, M.D.  CC: Dr. Marisue Humble, [name]

## 2018-10-26 NOTE — Patient Instructions (Addendum)
It was great meeting you! There is no evidence of dementia, stroke, or tumor with your exam and brain scan.  1. Bloodwork for TSH, B12. Your provider has requested that you have labwork completed today. Please go to [facility] ([id number]) on the second floor of this building before leaving the office today. You do not need to check in. If you are not called within 15 minutes please check with the front desk.  2. Recommend seeing a therapist on a regular basis and continue follow-up with [name] 3. Follow-up as needed

## 2018-10-27 LAB — TSH: TSH: 5.39 mIU/L — ABNORMAL HIGH (ref 0.40–4.50)

## 2018-10-27 LAB — VITAMIN B12: VITAMIN B 12: 682 pg/mL (ref 200–1100)

## 2018-10-31 ENCOUNTER — Encounter: Payer: Self-pay | Admitting: Neurology

## 2018-10-31 ENCOUNTER — Telehealth: Payer: Self-pay

## 2018-10-31 NOTE — Telephone Encounter (Signed)
Patient left vm that she was returning your call.

## 2018-10-31 NOTE — Telephone Encounter (Signed)
-----   Message from Cameron Sprang, MD sent at 10/30/2018  2:00 PM EDT ----- Pls let her know the B12 level was normal, thyroid level is slightly off but not sure what her prior one was with Dr. Marisue Humble. Pls let her know we will send results to Dr. Marisue Humble and see if he wants to do anything with it. Thanks

## 2018-10-31 NOTE — Telephone Encounter (Signed)
Spoke with pt relaying results below.  Advised that I would fax results to Dr. Andrew Au office and to follow up with him about thyroid level

## 2018-10-31 NOTE — Telephone Encounter (Signed)
Spoke with pt's husband - asked for return call as no one is listed on pt's DPR.  Husband stated that he will have pt return call.

## 2018-11-06 ENCOUNTER — Telehealth: Payer: Self-pay | Admitting: Psychiatry

## 2018-11-06 ENCOUNTER — Other Ambulatory Visit: Payer: Self-pay | Admitting: Psychiatry

## 2018-11-06 NOTE — Telephone Encounter (Signed)
Husband aware of addition and will add that tonight. Instructed to call back if not helping

## 2018-11-06 NOTE — Telephone Encounter (Signed)
Patient called and said that she is not sleeping at all and doesn't know what to do. Please call her at [phone]. All the new meds are keeping her awake.

## 2018-11-06 NOTE — Telephone Encounter (Signed)
She can add 1 extra little lorazepam 0.5 mg tablets to bedtime beyond what she is currently taking.  However this has a risk of worsening her ongoing memory problems but by taking it at bedtime it may not.

## 2018-11-26 ENCOUNTER — Encounter: Payer: Self-pay | Admitting: Psychiatry

## 2018-11-26 ENCOUNTER — Ambulatory Visit (INDEPENDENT_AMBULATORY_CARE_PROVIDER_SITE_OTHER): Payer: Medicare Other | Admitting: Psychiatry

## 2018-11-26 DIAGNOSIS — F05 Delirium due to known physiological condition: Secondary | ICD-10-CM

## 2018-11-26 DIAGNOSIS — F3181 Bipolar II disorder: Secondary | ICD-10-CM

## 2018-11-26 DIAGNOSIS — G3184 Mild cognitive impairment, so stated: Secondary | ICD-10-CM | POA: Diagnosis not present

## 2018-11-26 DIAGNOSIS — R7989 Other specified abnormal findings of blood chemistry: Secondary | ICD-10-CM

## 2018-11-26 DIAGNOSIS — F411 Generalized anxiety disorder: Secondary | ICD-10-CM

## 2018-11-26 DIAGNOSIS — F4001 Agoraphobia with panic disorder: Secondary | ICD-10-CM

## 2018-11-26 MED ORDER — LORAZEPAM 0.5 MG PO TABS: ORAL_TABLET | ORAL | 1 refills | Status: DC

## 2018-11-26 NOTE — Progress Notes (Signed)
[name] [id number] 1951-04-01 68 y.o.  Subjective:   Patient ID:  [name] is a 68 y.o. (DOB [birth year]) female.  Chief Complaint:  Chief Complaint  Patient presents with  . Altered Mental Status  . Anxiety  . Medication Problem    Change from alprazolam to lorazepam  . Panic Attack  . Memory Loss    HPI    [name] presents to the office today for follow-up of anxiety and depression and poor cognition.  Her last visit was October 12, 2018.  Because of her poor cognition which did seem to get even worse lately with her memory and given a negative unremarkable medical work-up by her primary care doctor the decision was made to change her from Xanax which she has been on for years to lorazepam.  Lorazepam often has less cognitive side effects than does alprazolam.  She had a lot of difficulty with headaches and insomnia with the transition.  She called several times in that transition.  Her husband reported that her cognition was better after the transition however.  Ativan has worked wonders.  Memory gotten pretty good.  HA withdrawal resolved.  Sleep 10 to 8:30AM.  More productive, cleaning, cooking, projects.  Don't ever want to return to Xanax.  Pleased.  Not depressed at present.  Still is easily anxious and worries a great deal.  Her daughter [name] has a child [name].  The child's father's family has had Covid virus seriously and 1 has died.  That causes her more anxiety.  Bill agrees that more productive and better memory.  herself.  He's afraid she might not be attentive enough driving bc of racing thoughts.  She has a hard time finishing sentences DT racing thoughts.  Forgetful of conversation.  He says her mood is the same as always.  She's controlling about minor things.   Prior psychiatric medication trials include paroxetine, Lexapro, risperidone 4 mg which was sedating, aripiprazole, Topamax for anxiety, perphenazine, Seroquel 800, olanzapine for  anxiety, and lithium which was not tolerated even at very low dosages.  She was intolerant of both Namenda and Aricept.  Lamotrigine 300.  Xanax.  This is not an exhaustive list.   Review of Systems:  Review of Systems  Musculoskeletal: Positive for arthralgias and back pain.  Neurological: Positive for tremors. Negative for weakness.  Psychiatric/Behavioral: Negative for agitation, behavioral problems, confusion, decreased concentration, dysphoric mood, hallucinations, self-injury, sleep disturbance and suicidal ideas. The patient is nervous/anxious. The patient is not hyperactive.     Medications: I have reviewed the patient's current medications.  Current Outpatient Medications  Medication Sig Dispense Refill  . aspirin 81 MG tablet Take 81 mg by mouth daily.      . busPIRone (BUSPAR) 30 MG tablet Take 30 mg by mouth 2 (two) times daily.    [name] etodolac (LODINE) 400 MG tablet Take 400 mg by mouth 2 (two) times daily.    . furosemide (LASIX) 20 MG tablet Take 20 mg by mouth daily as needed (for severe swelling).     [name] lamoTRIgine (LAMICTAL) 150 MG tablet Take 150 mg by mouth 2 (two) times daily.     [name] levocetirizine (XYZAL) 5 MG tablet Take 5 mg by mouth every evening.    [name] levothyroxine (SYNTHROID, LEVOTHROID) 75 MCG tablet Take 75 mcg by mouth daily.    [name] lisinopril (PRINIVIL,ZESTRIL) 20 MG tablet Take 20 mg by mouth daily.    . Methylfol-Methylcob-Acetylcyst (L-METHYL-MC NAC) 6-2-600 MG TABS  TK 1 T PO QD    . metoprolol succinate (TOPROL-XL) 50 MG 24 hr tablet Take 50 mg by mouth daily. Take with or immediately following a meal.    . potassium chloride SA (K-DUR,KLOR-CON) 20 MEQ tablet Take 20 mEq by mouth daily.    . QUEtiapine (SEROQUEL XR) 400 MG 24 hr tablet Take 800 mg by mouth at bedtime.    . simvastatin (ZOCOR) 40 MG tablet Take 40 mg by mouth daily.     . Vitamin D, Ergocalciferol, (DRISDOL) 50000 units CAPS capsule Take 50,000 Units by mouth 2 (two) times a week.     [name]  amoxicillin (AMOXIL) 875 MG tablet TAKE 1 TABLET BY MOUTH TWICE A DAY FOR 5 DAYS    . Aspirin-Calcium Carbonate 81-777 MG TABS Take by mouth.    . cefpodoxime (VANTIN) 100 MG tablet Take 100 mg by mouth 2 (two) times daily.  0  . cephALEXin (KEFLEX) 500 MG capsule Take 1 capsule (500 mg total) by mouth 4 (four) times daily. (Patient not taking: Reported on 11/26/2018) 20 capsule 0  . ciprofloxacin (CIPRO) 250 MG tablet Take 250 mg by mouth 2 (two) times daily.  0  . esomeprazole (NEXIUM) 40 MG capsule Take 1 capsule (40 mg total) by mouth daily. (Patient not taking: Reported on 11/26/2018) 30 capsule 0  . LORazepam (ATIVAN) 0.5 MG tablet 1 in AM and 2 at night 270 tablet 1  . phenazopyridine (PYRIDIUM) 200 MG tablet Take 200 mg by mouth 3 (three) times daily as needed for pain.     . polyethylene glycol (MIRALAX / GLYCOLAX) packet Take 17 g by mouth 2 (two) times daily as needed for mild constipation.    . QUEtiapine (SEROQUEL) 200 MG tablet Take 200 mg by mouth at bedtime as needed (sleep).     No current facility-administered medications for this visit.     Medication Side Effects: None  Allergies:  Allergies  Allergen Reactions  . Celexa [Citalopram Hydrobromide] Nausea Only  . Maxzide [Triamterene-Hctz]     Hurt patients kidneys  . Metformin And Related Nausea And Vomiting  . Other Nausea And Vomiting, Nausea Only and Other (See Comments)    Hurt patients kidneys  . Reglan [Metoclopramide] Nausea Only  . Risperdal [Risperidone] Nausea Only    Also causes hallucinations  . Trazodone And Nefazodone Nausea Only  . Zestril [Lisinopril] Nausea Only  . Lithium Palpitations and Other (See Comments)    Shakes and delusional pt started seeing things      Past Medical History:  Diagnosis Date  . Anxiety   . Arthritis   . Asthma   . Bipolar 1 disorder ([place])   . CHF (congestive heart failure) ([place])   . Complication of anesthesia    left a bad taste in my mouth it has lasted since  january   . Depression   . Diabetes mellitus   . Hernia    umbilical  . Hyperlipidemia   . Hypertension   . Hypothyroidism   . Pericarditis, viral 2010 October  . Sleep apnea    uses cpap setting of 15    Family History  Problem Relation Age of Onset  . Diabetes Mother   . Hypertension Mother   . Hyperlipidemia Mother   . Diabetes Father   . Hypertension Father   . Hyperlipidemia Father   . Hypertension Sister     Social History   Socioeconomic History  . Marital status: Married    Spouse name:  Not on file  . Number of children: Not on file  . Years of education: Not on file  . Highest education level: Not on file  Occupational History  . Not on file  Social Needs  . Financial resource strain: Not on file  . Food insecurity:    Worry: Not on file    Inability: Not on file  . Transportation needs:    Medical: Not on file    Non-medical: Not on file  Tobacco Use  . Smoking status: Never Smoker  . Smokeless tobacco: Never Used  Substance and Sexual Activity  . Alcohol use: No  . Drug use: No  . Sexual activity: Not on file  Lifestyle  . Physical activity:    Days per week: Not on file    Minutes per session: Not on file  . Stress: Not on file  Relationships  . Social connections:    Talks on phone: Not on file    Gets together: Not on file    Attends religious service: Not on file    Active member of club or organization: Not on file    Attends meetings of clubs or organizations: Not on file    Relationship status: Not on file  . Intimate partner violence:    Fear of current or ex partner: Not on file    Emotionally abused: Not on file    Physically abused: Not on file    Forced sexual activity: Not on file  Other Topics Concern  . Not on file  Social History Narrative  . Not on file    Past Medical History, Surgical history, Social history, and Family history were reviewed and updated as appropriate.   Please see review of systems for further  details on the patient's review from today.   Objective:   Physical Exam:  There were no vitals taken for this visit.  Physical Exam Neurological:     Mental Status: She is alert and oriented to person, place, and time.     Cranial Nerves: No dysarthria.  Psychiatric:        Attention and Perception: Attention normal.        Mood and Affect: Mood is anxious. Mood is not depressed.        Speech: Speech normal. Speech is not rapid and pressured or slurred.        Behavior: Behavior is cooperative.        Thought Content: Thought content normal. Thought content is not paranoid or delusional. Thought content does not include homicidal or suicidal ideation. Thought content does not include homicidal or suicidal plan.        Cognition and Memory: She exhibits impaired recent memory.        Judgment: Judgment normal.     Comments: Cognition is markedly improved though short-term memory problems are not completely resolved.   Talkative chronically per usual.  Lab Review:     Component Value Date/Time   NA 140 03/23/2016 1933   K 4.4 03/23/2016 1933   CL 106 03/23/2016 1933   CO2 27 03/23/2016 1933   GLUCOSE 143 (H) 03/23/2016 1933   GLUCOSE 154 (H) 09/01/2006 1100   BUN 23 (H) 03/23/2016 1933   CREATININE 0.98 03/23/2016 1933   CREATININE 0.93 07/19/2011 1110   CALCIUM 9.8 03/23/2016 1933   PROT 6.8 01/21/2015 0140   ALBUMIN 3.8 01/21/2015 0140   AST 26 01/21/2015 0140   ALT 25 01/21/2015 0140   ALKPHOS 100 01/21/2015  0140   BILITOT 0.6 01/21/2015 0140   GFRNONAA >60 03/23/2016 1933   GFRAA >60 03/23/2016 1933       Component Value Date/Time   WBC 7.6 03/23/2016 1933   RBC 4.50 03/23/2016 1933   HGB 13.5 03/23/2016 1933   HCT 40.8 03/23/2016 1933   PLT 266 03/23/2016 1933   MCV 90.7 03/23/2016 1933   MCH 30.0 03/23/2016 1933   MCHC 33.1 03/23/2016 1933   RDW 13.6 03/23/2016 1933   LYMPHSABS 2.5 01/21/2015 0140   MONOABS 0.4 01/21/2015 0140   EOSABS 0.2 01/21/2015  0140   BASOSABS 0.1 01/21/2015 0140   Prior lamotrigine level in 2018 on this dosage was 3.6.  Not particularly high.  Per Dr. Marcos Eke: Clinical Impressions: Cognitive complaints are likely due to underlying psychiatric disorder (bipolar disorder/anxiety disorder with racing thoughts). Diagnostic impressions based on test performances are limited due to the patient's severe inability to fully attend to the tasks. However, based on clinical presentation, I highly doubt the presence of a neurodegenerative dementia. It is much more likely that her racing thoughts and psychiatric disorders are interfering with cognitive   No results found for: POCLITH, LITHIUM   No results found for: PHENYTOIN, PHENOBARB, VALPROATE, CBMZ   .res Assessment: Plan:    Acute confusional state  Mild cognitive impairment  Generalized anxiety disorder  Panic disorder with agoraphobia  [name] disorder (HCC)  Low vitamin D level   At the last visit in February [place] is more acutely confused recently for no clear reason.  Does acknowledge a lot of anxiety and racing thoughts but doesn't appear obviously manic.  She had a negative medical work-up for causes of short-term memory problems and confusion.  Therefore the decision was made to try switching her from alprazolam to lorazepam at the lowest possible dose in hopes of improving her cognition.  She was switched from alprazolam 0.5 twice daily to lorazepam 0.5 twice daily and not surprisingly she had some withdrawal side effects including headaches and insomnia.  It was manageable however.  We did have to increase the lorazepam to 0.5 mg every morning and 2 of the 0.5 mg tablets nightly in order to resolve the insomnia.  That has now resolved.  Her cognition is markedly improved.  Her anxiety is no worse she still is avoidant of driving and has occasional panic attacks and is generally anxious.  But it is not worse.  She has had no recent manic symptoms.      Also to minimize risk from psych meds on cognition reduce lamotrigine to 75 mg q am and 150 HS for 2 weeks, then 150 HS. this needs to be verified with the husband.  I am not certain she actually reduced this medication.  In either case she is improved so we do not need to make any changes in the lamotrigine.  That is what as were her current dose is is the dose she should continue for now.  his appt was 30 mins.  I connected with patient by a video enabled telemedicine application or telephone, with their informed consent, and verified patient privacy and that I am speaking with the correct person using two identifiers.  I was located at office and patient at home.  This was done as a phone visit because the patient lacks the technical skill and equipment to do video conferencing.  FU 2 mos  [name], MD, DFAPA   Please see After Visit Summary for patient specific instructions.  No future appointments.  No orders of the defined types were placed in this encounter.     -------------------------------

## 2018-11-29 ENCOUNTER — Other Ambulatory Visit: Payer: Self-pay

## 2018-11-29 MED ORDER — LORAZEPAM 0.5 MG PO TABS: ORAL_TABLET | ORAL | 1 refills | Status: DC

## 2019-02-05 ENCOUNTER — Encounter: Payer: Medicare Other | Admitting: Podiatry

## 2019-02-08 ENCOUNTER — Ambulatory Visit: Payer: Medicare Other | Admitting: Podiatry

## 2019-02-08 ENCOUNTER — Ambulatory Visit (INDEPENDENT_AMBULATORY_CARE_PROVIDER_SITE_OTHER): Payer: Medicare Other

## 2019-02-08 ENCOUNTER — Other Ambulatory Visit: Payer: Self-pay

## 2019-02-08 DIAGNOSIS — M7662 Achilles tendinitis, left leg: Secondary | ICD-10-CM

## 2019-02-08 DIAGNOSIS — B351 Tinea unguium: Secondary | ICD-10-CM | POA: Diagnosis not present

## 2019-02-08 DIAGNOSIS — M722 Plantar fascial fibromatosis: Secondary | ICD-10-CM | POA: Diagnosis not present

## 2019-02-08 DIAGNOSIS — E1149 Type 2 diabetes mellitus with other diabetic neurological complication: Secondary | ICD-10-CM

## 2019-02-08 DIAGNOSIS — M79675 Pain in left toe(s): Secondary | ICD-10-CM | POA: Diagnosis not present

## 2019-02-08 DIAGNOSIS — M7732 Calcaneal spur, left foot: Secondary | ICD-10-CM

## 2019-02-08 DIAGNOSIS — M79674 Pain in right toe(s): Secondary | ICD-10-CM | POA: Diagnosis not present

## 2019-02-08 DIAGNOSIS — M79672 Pain in left foot: Secondary | ICD-10-CM

## 2019-02-08 MED ORDER — DICLOFENAC SODIUM 1 % TD GEL: 2.0000 g | Freq: Four times a day (QID) | TRANSDERMAL | 2 refills | Status: DC

## 2019-02-08 NOTE — Progress Notes (Signed)
Subjective: 68 year old female presents the office today for concerns of pain in her left heel mostly in the back of her heel but also the bottom of the heel.  This is been ongoing for 3 months and is worse in the morning when she first gets up.  She denies any recent injury or trauma to her feet.  She is also has her nails be trimmed today as they are thick and elongated causing pain. She also gets calluses to her foot.  Denies any systemic complaints such as fevers, chills, nausea, vomiting. No acute changes since last appointment, and no other complaints at this time.   Objective: AAO x3, NAD DP/PT pulses palpable bilaterally, CRT less than 3 seconds Tenderness palpation of the plantar medial tubercle of the calcaneus at the insertion of the plantar fascia on the left foot.  Also there is discomfort in the posterior aspect of the heel in the area of prominent bone spur.  Achilles tendon appears to be intact.  There is no edema, erythema.  Thompson test is negative. Nails are hypertrophic, dystrophic, brittle, discolored, elongated 10. No surrounding redness or drainage. Tenderness nails 1-5 bilaterally. No open lesions or pre-ulcerative lesions are identified today.  Hyperkeratotic lesion present bilateral medial hallux.  Upon debridement there is no ongoing ulceration drainage or signs of infection. No open lesions or pre-ulcerative lesions.  No pain with calf compression, swelling, warmth, erythema  Assessment: 68 year old female with heel pain, and plantar fasciitis/achilles tendonitis; Symptomatic onychomycosis, hyperkeratotic lesions  Plan: -All treatment options discussed with the patient including all alternatives, risks, complications.  -Nails debrided x 10 without any complications or bleeding -Callus sharply debrided x 2 without any complications or bleeding -Night splint dispensed. -Plantar fascia brace dispensed -Steroid injection preformed today. See procedure note below -RTC 6  weeks or sooner if needed  Procedure: Injection Tendon/Ligament Discussed alternatives, risks, complications and verbal consent was obtained.  Location: Left plantar fascia at the glabrous junction; medial approach. Skin Prep: Alcohol  Injectate: 0.5cc 0.5% marcaine plain, 0.5 cc 2% lidocaine plain and, 1 cc kenalog 10. Disposition: Patient tolerated procedure well. Injection site dressed with a band-aid.  Post-injection care was discussed and return precautions discussed.   Trula Slade DPM

## 2019-02-27 ENCOUNTER — Other Ambulatory Visit: Payer: Self-pay

## 2019-02-27 MED ORDER — LAMOTRIGINE 150 MG PO TABS: 150.0000 mg | ORAL_TABLET | Freq: Two times a day (BID) | ORAL | 0 refills | Status: DC

## 2019-03-21 ENCOUNTER — Other Ambulatory Visit: Payer: Self-pay

## 2019-03-21 ENCOUNTER — Ambulatory Visit: Payer: Medicare Other | Admitting: Podiatry

## 2019-03-21 ENCOUNTER — Encounter: Payer: Self-pay | Admitting: Podiatry

## 2019-03-21 VITALS — Temp 98.2°F

## 2019-03-21 DIAGNOSIS — M7662 Achilles tendinitis, left leg: Secondary | ICD-10-CM

## 2019-03-21 DIAGNOSIS — M7732 Calcaneal spur, left foot: Secondary | ICD-10-CM | POA: Diagnosis not present

## 2019-03-21 NOTE — Progress Notes (Signed)
Subjective: 68 year old female presents the office today for follow-up evaluation of left heel pain.  She is injection helped quite a bit although it caused her blood sugar to run about 200 for about 3 days.  She states that pain is resolved and she is only getting pain mostly the back of her heel now.  She states that she wears a shoe that is close back causes discomfort.  Also she has majority tenderness towards the end of the day.  This is after being on her feet all day.  No recent injury. Denies any systemic complaints such as fevers, chills, nausea, vomiting. No acute changes since last appointment, and no other complaints at this time.   Objective: AAO x3, NAD DP/PT pulses palpable bilaterally, CRT less than 3 seconds There is tenderness palpation of prominent bone spur to the posterior aspect left heel.  No significant edema, erythema.  No pain with Achilles tendon.  No pain of plantar heel.  No pain along the course or insertion of the plantar fascia.  Negative Thompson test. No open lesions or pre-ulcerative lesions.  No pain with calf compression, swelling, warmth, erythema  Assessment: Posterior bone spur causing discomfort  Plan: -All treatment options discussed with the patient including all alternatives, risks, complications.  -Reviewed the prior x-rays.  Old acute fracture of the heel spur.  Today the tendon appears to be intact.  I did dispense a gel offloading pad.  Continue Voltaren gel.  Dispensed heel lifts.  Continue with stretching, icing.  She has a night splint at home that she cannot sleep and we discussed wearing this when she is watching TV at night. -Patient encouraged to call the office with any questions, concerns, change in symptoms.   Trula Slade DPM

## 2019-04-17 ENCOUNTER — Other Ambulatory Visit: Payer: Self-pay | Admitting: Psychiatry

## 2019-04-17 ENCOUNTER — Other Ambulatory Visit: Payer: Self-pay

## 2019-04-17 MED ORDER — QUETIAPINE FUMARATE ER 400 MG PO TB24: 800.0000 mg | ORAL_TABLET | Freq: Every day | ORAL | 3 refills | Status: DC

## 2019-04-18 ENCOUNTER — Other Ambulatory Visit: Payer: Self-pay

## 2019-04-18 ENCOUNTER — Ambulatory Visit: Payer: Medicare Other | Admitting: Podiatry

## 2019-04-18 DIAGNOSIS — M79674 Pain in right toe(s): Secondary | ICD-10-CM | POA: Diagnosis not present

## 2019-04-18 DIAGNOSIS — M7662 Achilles tendinitis, left leg: Secondary | ICD-10-CM | POA: Diagnosis not present

## 2019-04-18 DIAGNOSIS — E1149 Type 2 diabetes mellitus with other diabetic neurological complication: Secondary | ICD-10-CM | POA: Diagnosis not present

## 2019-04-18 DIAGNOSIS — B351 Tinea unguium: Secondary | ICD-10-CM

## 2019-04-18 DIAGNOSIS — M7732 Calcaneal spur, left foot: Secondary | ICD-10-CM

## 2019-04-18 DIAGNOSIS — M79675 Pain in left toe(s): Secondary | ICD-10-CM | POA: Diagnosis not present

## 2019-04-24 NOTE — Progress Notes (Addendum)
Subjective: 68 year old female presents the office today for follow-up evaluation of left heel pain as well as asking for her nails be trimmed today's are thickened elongated she cannot do them herself.  She said that she still gets pain in the back of her heel and pain is intermittent.  The pain she was getting in the morning has much improved. No swelling no recent injury or falls.  She has been using the heel lifts, Voltaren gel which helps some as well as the offloading pads. Denies any systemic complaints such as fevers, chills, nausea, vomiting. No acute changes since last appointment, and no other complaints at this time.   Objective: AAO x3, NAD DP/PT pulses palpable bilaterally, CRT less than 3 seconds There is tenderness palpation of prominent bone spur to the posterior aspect left heel.  No significant edema, erythema.  No pain with Achilles tendon.  No pain of plantar heel.  No pain along the course or insertion of the plantar fascia.  Negative Thompson test.  Overall unchanged Nails are hypertrophic, dystrophic, brittle, discolored, elongated 10. No surrounding redness or drainage. Tenderness nails 1-5 bilaterally. No open lesions or pre-ulcerative lesions are identified today. No open lesions or pre-ulcerative lesions.  No pain with calf compression, swelling, warmth, erythema  Assessment: Posterior bone spur causing discomfort; symptomatic onychomycosis  Plan: -All treatment options discussed with the patient including all alternatives, risks, complications.  -At this time given her continued pain will refer to physical therapy.  Prescription for [facility] was written today.  Continue with home stretching, icing as well as offloading pads, heel lifts, anti-inflammatories.  She wants to hold off on any surgical intervention. -Nails debrided x10 without any complications or bleeding  Return in about 9 weeks (around 06/20/2019).  Trula Slade DPM

## 2019-05-06 ENCOUNTER — Ambulatory Visit (INDEPENDENT_AMBULATORY_CARE_PROVIDER_SITE_OTHER): Payer: Medicare Other | Admitting: Psychiatry

## 2019-05-06 ENCOUNTER — Other Ambulatory Visit: Payer: Self-pay

## 2019-05-06 ENCOUNTER — Encounter: Payer: Self-pay | Admitting: Psychiatry

## 2019-05-06 DIAGNOSIS — F05 Delirium due to known physiological condition: Secondary | ICD-10-CM

## 2019-05-06 DIAGNOSIS — F411 Generalized anxiety disorder: Secondary | ICD-10-CM | POA: Diagnosis not present

## 2019-05-06 DIAGNOSIS — F5105 Insomnia due to other mental disorder: Secondary | ICD-10-CM

## 2019-05-06 DIAGNOSIS — F3181 Bipolar II disorder: Secondary | ICD-10-CM

## 2019-05-06 DIAGNOSIS — G3184 Mild cognitive impairment, so stated: Secondary | ICD-10-CM

## 2019-05-06 DIAGNOSIS — F4001 Agoraphobia with panic disorder: Secondary | ICD-10-CM

## 2019-05-06 DIAGNOSIS — R7989 Other specified abnormal findings of blood chemistry: Secondary | ICD-10-CM

## 2019-05-06 NOTE — Progress Notes (Signed)
MONYAE HORNES [id number] 1951/08/20 68 y.o.  Subjective:   Patient ID:  [name] is a 68 y.o. (DOB 26-Feb-1951) female.  Chief Complaint:  Chief Complaint  Patient presents with  . Follow-up    Medication Management  . Anxiety    Medication Management  . Other    Bipolar    HPI    [name] presents to the office today for follow-up of anxiety and depression and poor cognition.  At her visit October 12, 2018.  Because of her poor cognition which did seem to get even worse lately with her memory and given a negative unremarkable medical work-up by her primary care doctor the decision was made to change her from Xanax which she has been on for years to lorazepam.  Lorazepam often has less cognitive side effects than does alprazolam.  She had a lot of difficulty with headaches and insomnia with the transition.  She called several times in that transition.  Her husband reported that her cognition was better after the transition however.  She was satisfied with the Ativan.  There was an attempt to reduce her lamotrigine to 150 mg also in hopes of improving cognition but it was uncertain at the last visit where there she had actually done so. Last visit was November 26, 2018 and no other med changes were made.  No changes since then except increase in thyroid medication.  Bad news, [name] has prostate and kidney cancer again.  Will see the oncologist next week.  A few crying spells but mainly feels scared.  Worry over his health and how she'll do if something happens to him.    She realizes she  Needs to drive occasionally bc of his health.  [name] had prostate CA and lived to 67 yo.  Uncle died of it.  Not markedly depressed. Also stressed over [name] 68 yo not doing school work and causing problems.  Her father [name] is a bad parent and not helpful.  Patient reports stable mood and denies unusual depressed or irritable moods.  Guilt tendency.   Patient reports difficulty with  sleep initiation or maintenance in part DT anxiety and racing thoughts.  Not napping. Not manic.[name] Denies appetite disturbance.  Patient reports that energy and motivation have been good.  Patient denies any difficulty with concentration.  Patient denies any suicidal ideation.  Prior psychiatric medication trials include paroxetine, Lexapro, risperidone 4 mg which was sedating, aripiprazole, Topamax for anxiety, perphenazine, Seroquel 800, olanzapine for anxiety, and lithium which was not tolerated even at very low dosages.  She was intolerant of both Namenda and Aricept.  Lamotrigine 300.  Xanax.  This is not an exhaustive list.  Review of Systems:  Review of Systems  Musculoskeletal: Positive for arthralgias and back pain.  Neurological: Positive for tremors. Negative for weakness.  Psychiatric/Behavioral: Negative for agitation, behavioral problems, confusion, decreased concentration, dysphoric mood, hallucinations, self-injury, sleep disturbance and suicidal ideas. The patient is nervous/anxious. The patient is not hyperactive.     Medications: I have reviewed the patient's current medications.  Current Outpatient Medications  Medication Sig Dispense Refill  . amLODipine (NORVASC) 5 MG tablet     . amoxicillin (AMOXIL) 875 MG tablet TAKE 1 TABLET BY MOUTH TWICE A DAY FOR 5 DAYS    . aspirin 81 MG tablet Take 81 mg by mouth daily.      . Aspirin-Calcium Carbonate 81-777 MG TABS Take by mouth.    . busPIRone (BUSPAR) 30 MG  tablet Take 30 mg by mouth 2 (two) times daily.    . cefpodoxime (VANTIN) 100 MG tablet Take 100 mg by mouth 2 (two) times daily.  0  . cephALEXin (KEFLEX) 500 MG capsule Take 1 capsule (500 mg total) by mouth 4 (four) times daily. 20 capsule 0  . ciprofloxacin (CIPRO) 250 MG tablet Take 250 mg by mouth 2 (two) times daily.  0  . diclofenac sodium (VOLTAREN) 1 % GEL Apply 2 g topically 4 (four) times daily. Rub into affected area of foot 2 to 4 times daily 100 g 2  .  esomeprazole (NEXIUM) 40 MG capsule Take 1 capsule (40 mg total) by mouth daily. 30 capsule 0  . etodolac (LODINE) 400 MG tablet Take 400 mg by mouth 2 (two) times daily.    . furosemide (LASIX) 20 MG tablet Take 20 mg by mouth daily as needed (for severe swelling).     [name] lamoTRIgine (LAMICTAL) 150 MG tablet TAKE 1 TABLET BY MOUTH  TWICE DAILY 180 tablet 3  . levocetirizine (XYZAL) 5 MG tablet Take 5 mg by mouth every evening.    [name] levothyroxine (SYNTHROID) 88 MCG tablet TAKE 1 TABLET BY MOUTH EVERY MORNING ON AN EMPTY STOMACH    . lisinopril (PRINIVIL,ZESTRIL) 20 MG tablet Take 20 mg by mouth daily.    [name] LORazepam (ATIVAN) 0.5 MG tablet 1 in AM and 2 at night 270 tablet 1  . Methylfol-Methylcob-Acetylcyst (L-METHYL-MC NAC) 6-2-600 MG TABS TK 1 T PO QD    . metoprolol succinate (TOPROL-XL) 50 MG 24 hr tablet Take 50 mg by mouth daily. Take with or immediately following a meal.    . phenazopyridine (PYRIDIUM) 200 MG tablet Take 200 mg by mouth 3 (three) times daily as needed for pain.     . polyethylene glycol (MIRALAX / GLYCOLAX) packet Take 17 g by mouth 2 (two) times daily as needed for mild constipation.    . potassium chloride SA (K-DUR,KLOR-CON) 20 MEQ tablet Take 20 mEq by mouth daily.    . QUEtiapine (SEROQUEL XR) 400 MG 24 hr tablet Take 2 tablets (800 mg total) by mouth at bedtime. 180 tablet 3  . QUEtiapine (SEROQUEL) 200 MG tablet Take 200 mg by mouth at bedtime as needed (sleep).    . simvastatin (ZOCOR) 40 MG tablet Take 40 mg by mouth daily.     . Vitamin D, Ergocalciferol, (DRISDOL) 50000 units CAPS capsule Take 50,000 Units by mouth 2 (two) times a week.      No current facility-administered medications for this visit.     Medication Side Effects: None  Allergies:  Allergies  Allergen Reactions  . Celexa [Citalopram Hydrobromide] Nausea Only  . Maxzide [Triamterene-Hctz]     Hurt patients kidneys  . Metformin And Related Nausea And Vomiting  . Other Nausea And Vomiting,  Nausea Only and Other (See Comments)    Hurt patients kidneys  . Reglan [Metoclopramide] Nausea Only  . Risperdal [Risperidone] Nausea Only    Also causes hallucinations  . Trazodone And Nefazodone Nausea Only  . Zestril [Lisinopril] Nausea Only  . Lithium Palpitations and Other (See Comments)    Shakes and delusional pt started seeing things      Past Medical History:  Diagnosis Date  . Anxiety   . Arthritis   . Asthma   . Bipolar 1 disorder ([place])   . CHF (congestive heart failure) ([place])   . Complication of anesthesia    left a bad taste in my  mouth it has lasted since january   . Depression   . Diabetes mellitus   . Hernia    umbilical  . Hyperlipidemia   . Hypertension   . Hypothyroidism   . Pericarditis, viral 2010 October  . Sleep apnea    uses cpap setting of 15    Family History  Problem Relation Age of Onset  . Diabetes Mother   . Hypertension Mother   . Hyperlipidemia Mother   . Diabetes Father   . Hypertension Father   . Hyperlipidemia Father   . Hypertension Sister     Social History   Socioeconomic History  . Marital status: Married    Spouse name: Not on file  . Number of children: Not on file  . Years of education: Not on file  . Highest education level: Not on file  Occupational History  . Not on file  Social Needs  . Financial resource strain: Not on file  . Food insecurity    Worry: Not on file    Inability: Not on file  . Transportation needs    Medical: Not on file    Non-medical: Not on file  Tobacco Use  . Smoking status: Never Smoker  . Smokeless tobacco: Never Used  Substance and Sexual Activity  . Alcohol use: No  . Drug use: No  . Sexual activity: Not on file  Lifestyle  . Physical activity    Days per week: Not on file    Minutes per session: Not on file  . Stress: Not on file  Relationships  . Social Herbalist on phone: Not on file    Gets together: Not on file    Attends religious service: Not on file     Active member of club or organization: Not on file    Attends meetings of clubs or organizations: Not on file    Relationship status: Not on file  . Intimate partner violence    Fear of current or ex partner: Not on file    Emotionally abused: Not on file    Physically abused: Not on file    Forced sexual activity: Not on file  Other Topics Concern  . Not on file  Social History Narrative  . Not on file    Past Medical History, Surgical history, Social history, and Family history were reviewed and updated as appropriate.   Please see review of systems for further details on the patient's review from today.   Objective:   Physical Exam:  There were no vitals taken for this visit.  Physical Exam Constitutional:      General: She is not in acute distress.    Appearance: She is well-developed. She is obese.  Musculoskeletal:        General: No deformity.  Neurological:     Mental Status: She is alert and oriented to person, place, and time.     Cranial Nerves: No dysarthria.     Coordination: Coordination normal.  Psychiatric:        Attention and Perception: Attention and perception normal. She does not perceive auditory or visual hallucinations.        Mood and Affect: Mood is anxious. Mood is not depressed. Affect is tearful. Affect is not labile, blunt, angry or inappropriate.        Speech: Speech normal. Speech is not rapid and pressured or slurred.        Behavior: Behavior normal. Behavior is cooperative.  Thought Content: Thought content normal. Thought content is not paranoid or delusional. Thought content does not include homicidal or suicidal ideation. Thought content does not include homicidal or suicidal plan.        Cognition and Memory: Cognition normal. She exhibits impaired recent memory.        Judgment: Judgment normal.     Comments: Cognition is markedly improved though short-term memory problems are not completely resolved. Tearful over husbands  health.   Talkative chronically per usual.  Lab Review:     Component Value Date/Time   NA 140 03/23/2016 1933   K 4.4 03/23/2016 1933   CL 106 03/23/2016 1933   CO2 27 03/23/2016 1933   GLUCOSE 143 (H) 03/23/2016 1933   GLUCOSE 154 (H) 09/01/2006 1100   BUN 23 (H) 03/23/2016 1933   CREATININE 0.98 03/23/2016 1933   CREATININE 0.93 07/19/2011 1110   CALCIUM 9.8 03/23/2016 1933   PROT 6.8 01/21/2015 0140   ALBUMIN 3.8 01/21/2015 0140   AST 26 01/21/2015 0140   ALT 25 01/21/2015 0140   ALKPHOS 100 01/21/2015 0140   BILITOT 0.6 01/21/2015 0140   GFRNONAA >60 03/23/2016 1933   GFRAA >60 03/23/2016 1933       Component Value Date/Time   WBC 7.6 03/23/2016 1933   RBC 4.50 03/23/2016 1933   HGB 13.5 03/23/2016 1933   HCT 40.8 03/23/2016 1933   PLT 266 03/23/2016 1933   MCV 90.7 03/23/2016 1933   MCH 30.0 03/23/2016 1933   MCHC 33.1 03/23/2016 1933   RDW 13.6 03/23/2016 1933   LYMPHSABS 2.5 01/21/2015 0140   MONOABS 0.4 01/21/2015 0140   EOSABS 0.2 01/21/2015 0140   BASOSABS 0.1 01/21/2015 0140   Prior lamotrigine level in 2018 on this dosage was 3.6.  Not particularly high.  Per Dr. Marcos Eke: Clinical Impressions: Cognitive complaints are likely due to underlying psychiatric disorder (bipolar disorder/anxiety disorder with racing thoughts). Diagnostic impressions based on test performances are limited due to the patient's severe inability to fully attend to the tasks. However, based on clinical presentation, I highly doubt the presence of a neurodegenerative dementia. It is much more likely that her racing thoughts and psychiatric disorders are interfering with cognitive   No results found for: POCLITH, LITHIUM   No results found for: PHENYTOIN, PHENOBARB, VALPROATE, CBMZ   .res Assessment: Plan:    [name] disorder ([place])  Insomnia due to mental condition  Panic disorder with agoraphobia  Generalized anxiety disorder  Mild cognitive impairment  Low vitamin D  level  Acute confusional state   Thayer Headings has chronic severe anxiety with panic attacks as well as bipolar disorder with a history of significant lability.  Overall mood lability has been improved with the current med med regimen.  She remains chronically anxious.  She also has mild cognitive impairment as a complicating factor.  She needs her husband's assistance to manage her medications.  Acute confusion resolved.  At the visit in February Nihal is more acutely confused recently for no clear reason.   She had a negative medical work-up for causes of short-term memory problems and confusion.  Therefore the decision was made to try switching her from alprazolam to lorazepam at the lowest possible dose in hopes of improving her cognition.  This was successful and her cognition was improved significantly and her husband verified this.  Her anxiety was not worse at her last visit in April.  No changes in meds indicated.  Discussed potential metabolic side effects associated  with atypical antipsychotics, as well as potential risk for movement side effects. Advised pt to contact office if movement side effects occur.   We discussed the short-term risks associated with benzodiazepines including sedation and increased fall risk among others.  Discussed long-term side effect risk including dependence, potential withdrawal symptoms, and the potential eventual dose-related risk of dementia.  Use of lorazepam remains medically necessary to manage her panic disorder.  Sleep hygiene discussed in detail.  She is having some trouble related to anxiety.  Supportive and cognitive work are done with the patient on her excessive guilt.  For example taking on responsibility for things with her granddaughter that she can fact not control.  Also addressing her fears related to her husband's diagnosis of cancer.  this appt was 35 mins.  FU 2 - 3 mos  [name], MD, DFAPA   Please see After Visit Summary for  patient specific instructions.  Future Appointments  Date Time Provider [place]  06/20/2019  2:45 PM Trula Slade, DPM [facility]    No orders of the defined types were placed in this encounter.     -------------------------------

## 2019-05-14 ENCOUNTER — Other Ambulatory Visit: Payer: Self-pay | Admitting: Psychiatry

## 2019-05-15 ENCOUNTER — Other Ambulatory Visit: Payer: Self-pay

## 2019-05-15 MED ORDER — BUSPIRONE HCL 30 MG PO TABS: 30.0000 mg | ORAL_TABLET | Freq: Two times a day (BID) | ORAL | 3 refills | Status: DC

## 2019-05-15 NOTE — Telephone Encounter (Signed)
She is no longer on alprazolam.  The lorazepam needs to be renewed.  I could not figure how to delete the alprazolam.  Can you help with that

## 2019-05-15 NOTE — Telephone Encounter (Signed)
Next appt 08/06/2019  Pt on both?

## 2019-05-16 ENCOUNTER — Other Ambulatory Visit: Payer: Self-pay | Admitting: Psychiatry

## 2019-05-16 MED ORDER — LORAZEPAM 0.5 MG PO TABS: ORAL_TABLET | ORAL | 0 refills | Status: DC

## 2019-05-16 NOTE — Progress Notes (Signed)
It appears that previous refill request for lorazepam failed to go through so it was sent again.

## 2019-05-16 NOTE — Telephone Encounter (Signed)
Okay send now please

## 2019-05-21 ENCOUNTER — Telehealth: Payer: Self-pay | Admitting: Psychiatry

## 2019-05-21 NOTE — Telephone Encounter (Signed)
Pt called in tears. Her husband has cancer and will be having surgery in two weeks. She is crying all the time and needs something to help her stop and to help her calm down.

## 2019-06-01 ENCOUNTER — Other Ambulatory Visit: Payer: Self-pay | Admitting: Podiatry

## 2019-06-03 ENCOUNTER — Telehealth: Payer: Self-pay | Admitting: Psychiatry

## 2019-06-03 ENCOUNTER — Telehealth: Payer: Self-pay | Admitting: *Deleted

## 2019-06-03 MED ORDER — DICLOFENAC SODIUM 1 % TD GEL: 2.0000 g | Freq: Four times a day (QID) | TRANSDERMAL | 2 refills | Status: DC

## 2019-06-03 NOTE — Telephone Encounter (Signed)
Pt wants to know if she can take an exta ativan about mid day, her anxiety is very high, and she is worried she will have a panic attack.

## 2019-06-03 NOTE — Telephone Encounter (Signed)
Per Dr Jacqualyn Posey, ok to refill the voltaren gel 1% gel and called the patient. Lattie Haw

## 2019-06-03 NOTE — Addendum Note (Signed)
Addended by: Cranford Mon R on: 06/03/2019 10:10 AM   Modules accepted: Orders

## 2019-06-03 NOTE — Telephone Encounter (Signed)
Called patient and the medicine is already at the pharmacy per Dr Jacqualyn Posey. Lattie Haw

## 2019-06-03 NOTE — Telephone Encounter (Signed)
Called and spoke with the patient stating that Dr Jacqualyn Posey refilled the Voltaren 1% gel and that it would be ready at the pharmacy. [name]

## 2019-06-05 NOTE — Telephone Encounter (Signed)
Okay to increase lorazepam to 0.5 mg every morning, 1 in the afternoon and 2 in the evening.

## 2019-06-05 NOTE — Telephone Encounter (Signed)
[name] called again this morning. She called 9/29 and 10/12 asking if she can take an extra Ativan during the day.  He husband has cancer and is receiving treatments.  She is very anxious and shaking.  Please call her to let her know if it is ok to take an extra Ativan or if there is something else she can do.  Please call [phone].

## 2019-06-05 NOTE — Telephone Encounter (Signed)
Spoke with pt. And her husband, they verbalized understanding.

## 2019-06-05 NOTE — Telephone Encounter (Signed)
Spoke with [name] she's under a lot of stress and anxiety obviously, worried about what might happen. Instructed her to add a afternoon dose and have at least 4 hours between doses so she doesn't get too sleepy. Tearful on phone but encouraged her and advised to call back for anything else. She was very appreciative of call

## 2019-06-06 NOTE — Telephone Encounter (Signed)
See other phone message  

## 2019-06-20 ENCOUNTER — Ambulatory Visit: Payer: Medicare Other | Admitting: Podiatry

## 2019-07-02 ENCOUNTER — Ambulatory Visit: Payer: Medicare Other | Admitting: Podiatry

## 2019-07-02 ENCOUNTER — Encounter: Payer: Self-pay | Admitting: Podiatry

## 2019-07-02 ENCOUNTER — Other Ambulatory Visit: Payer: Self-pay

## 2019-07-02 DIAGNOSIS — M7662 Achilles tendinitis, left leg: Secondary | ICD-10-CM | POA: Diagnosis not present

## 2019-07-02 DIAGNOSIS — E1149 Type 2 diabetes mellitus with other diabetic neurological complication: Secondary | ICD-10-CM | POA: Diagnosis not present

## 2019-07-02 DIAGNOSIS — M79674 Pain in right toe(s): Secondary | ICD-10-CM

## 2019-07-02 DIAGNOSIS — M79675 Pain in left toe(s): Secondary | ICD-10-CM | POA: Diagnosis not present

## 2019-07-02 DIAGNOSIS — M7732 Calcaneal spur, left foot: Secondary | ICD-10-CM | POA: Diagnosis not present

## 2019-07-02 DIAGNOSIS — B351 Tinea unguium: Secondary | ICD-10-CM | POA: Diagnosis not present

## 2019-07-02 NOTE — Patient Instructions (Signed)
The shoes I was wearing are called "oofos".   Achilles Tendinitis  with Rehab Achilles tendinitis is a disorder of the Achilles tendon. The Achilles tendon connects the large calf muscles (Gastrocnemius and Soleus) to the heel bone (calcaneus). This tendon is sometimes called the heel cord. It is important for pushing-off and standing on your toes and is important for walking, running, or jumping. Tendinitis is often caused by overuse and repetitive microtrauma. SYMPTOMS  Pain, tenderness, swelling, warmth, and redness may occur over the Achilles tendon even at rest.  Pain with pushing off, or flexing or extending the ankle.  Pain that is worsened after or during activity. CAUSES   Overuse sometimes seen with rapid increase in exercise programs or in sports requiring running and jumping.  Poor physical conditioning (strength and flexibility or endurance).  Running sports, especially training running down hills.  Inadequate warm-up before practice or play or failure to stretch before participation.  Injury to the tendon. PREVENTION   Warm up and stretch before practice or competition.  Allow time for adequate rest and recovery between practices and competition.  Keep up conditioning.  Keep up ankle and leg flexibility.  Improve or keep muscle strength and endurance.  Improve cardiovascular fitness.  Use proper technique.  Use proper equipment (shoes, skates).  To help prevent recurrence, taping, protective strapping, or an adhesive bandage may be recommended for several weeks after healing is complete. PROGNOSIS   Recovery may take weeks to several months to heal.  Longer recovery is expected if symptoms have been prolonged.  Recovery is usually quicker if the inflammation is due to a direct blow as compared with overuse or sudden strain. RELATED COMPLICATIONS   Healing time will be prolonged if the condition is not correctly treated. The injury must be given plenty  of time to heal.  Symptoms can reoccur if activity is resumed too soon.  Untreated, tendinitis may increase the risk of tendon rupture requiring additional time for recovery and possibly surgery. TREATMENT   The first treatment consists of rest anti-inflammatory medication, and ice to relieve the pain.  Stretching and strengthening exercises after resolution of pain will likely help reduce the risk of recurrence. Referral to a physical therapist or athletic trainer for further evaluation and treatment may be helpful.  A walking boot or cast may be recommended to rest the Achilles tendon. This can help break the cycle of inflammation and microtrauma.  Arch supports (orthotics) may be prescribed or recommended by your caregiver as an adjunct to therapy and rest.  Surgery to remove the inflamed tendon lining or degenerated tendon tissue is rarely necessary and has shown less than predictable results. MEDICATION   Nonsteroidal anti-inflammatory medications, such as aspirin and ibuprofen, may be used for pain and inflammation relief. Do not take within 7 days before surgery. Take these as directed by your caregiver. Contact your caregiver immediately if any bleeding, stomach upset, or signs of allergic reaction occur. Other minor pain relievers, such as acetaminophen, may also be used.  Pain relievers may be prescribed as necessary by your caregiver. Do not take prescription pain medication for longer than 4 to 7 days. Use only as directed and only as much as you need.  Cortisone injections are rarely indicated. Cortisone injections may weaken tendons and predispose to rupture. It is better to give the condition more time to heal than to use them. HEAT AND COLD  Cold is used to relieve pain and reduce inflammation for acute and chronic Achilles  tendinitis. Cold should be applied for 10 to 15 minutes every 2 to 3 hours for inflammation and pain and immediately after any activity that aggravates  your symptoms. Use ice packs or an ice massage.  Heat may be used before performing stretching and strengthening activities prescribed by your caregiver. Use a heat pack or a warm soak. SEEK MEDICAL CARE IF:  Symptoms get worse or do not improve in 2 weeks despite treatment.  New, unexplained symptoms develop. Drugs used in treatment may produce side effects.  EXERCISES:  RANGE OF MOTION (ROM) AND STRETCHING EXERCISES - Achilles Tendinitis  These exercises may help you when beginning to rehabilitate your injury. Your symptoms may resolve with or without further involvement from your physician, physical therapist or athletic trainer. While completing these exercises, remember:   Restoring tissue flexibility helps normal motion to return to the joints. This allows healthier, less painful movement and activity.  An effective stretch should be held for at least 30 seconds.  A stretch should never be painful. You should only feel a gentle lengthening or release in the stretched tissue.  STRETCH  Gastroc, Standing   Place hands on wall.  Extend right / left leg, keeping the front knee somewhat bent.  Slightly point your toes inward on your back foot.  Keeping your right / left heel on the floor and your knee straight, shift your weight toward the wall, not allowing your back to arch.  You should feel a gentle stretch in the right / left calf. Hold this position for 10 seconds. Repeat 3 times. Complete this stretch 2 times per day.  STRETCH  Soleus, Standing   Place hands on wall.  Extend right / left leg, keeping the other knee somewhat bent.  Slightly point your toes inward on your back foot.  Keep your right / left heel on the floor, bend your back knee, and slightly shift your weight over the back leg so that you feel a gentle stretch deep in your back calf.  Hold this position for 10 seconds. Repeat 3 times. Complete this stretch 2 times per day.  STRETCH  Gastrocsoleus,  Standing  Note: This exercise can place a lot of stress on your foot and ankle. Please complete this exercise only if specifically instructed by your caregiver.   Place the ball of your right / left foot on a step, keeping your other foot firmly on the same step.  Hold on to the wall or a rail for balance.  Slowly lift your other foot, allowing your body weight to press your heel down over the edge of the step.  You should feel a stretch in your right / left calf.  Hold this position for 10 seconds.  Repeat this exercise with a slight bend in your knee. Repeat 3 times. Complete this stretch 2 times per day.   STRENGTHENING EXERCISES - Achilles Tendinitis These exercises may help you when beginning to rehabilitate your injury. They may resolve your symptoms with or without further involvement from your physician, physical therapist or athletic trainer. While completing these exercises, remember:   Muscles can gain both the endurance and the strength needed for everyday activities through controlled exercises.  Complete these exercises as instructed by your physician, physical therapist or athletic trainer. Progress the resistance and repetitions only as guided.  You may experience muscle soreness or fatigue, but the pain or discomfort you are trying to eliminate should never worsen during these exercises. If this pain does worsen, stop  and make certain you are following the directions exactly. If the pain is still present after adjustments, discontinue the exercise until you can discuss the trouble with your clinician.  STRENGTH - Plantar-flexors   Sit with your right / left leg extended. Holding onto both ends of a rubber exercise band/tubing, loop it around the ball of your foot. Keep a slight tension in the band.  Slowly push your toes away from you, pointing them downward.  Hold this position for 10 seconds. Return slowly, controlling the tension in the band/tubing. Repeat 3 times.  Complete this exercise 2 times per day.   STRENGTH - Plantar-flexors   Stand with your feet shoulder width apart. Steady yourself with a wall or table using as little support as needed.  Keeping your weight evenly spread over the width of your feet, rise up on your toes.*  Hold this position for 10 seconds. Repeat 3 times. Complete this exercise 2 times per day.  *If this is too easy, shift your weight toward your right / left leg until you feel challenged. Ultimately, you may be asked to do this exercise with your right / left foot only.  STRENGTH  Plantar-flexors, Eccentric  Note: This exercise can place a lot of stress on your foot and ankle. Please complete this exercise only if specifically instructed by your caregiver.   Place the balls of your feet on a step. With your hands, use only enough support from a wall or rail to keep your balance.  Keep your knees straight and rise up on your toes.  Slowly shift your weight entirely to your right / left toes and pick up your opposite foot. Gently and with controlled movement, lower your weight through your right / left foot so that your heel drops below the level of the step. You will feel a slight stretch in the back of your calf at the end position.  Use the healthy leg to help rise up onto the balls of both feet, then lower weight only on the right / left leg again. Build up to 15 repetitions. Then progress to 3 consecutive sets of 15 repetitions.*  After completing the above exercise, complete the same exercise with a slight knee bend (about 30 degrees). Again, build up to 15 repetitions. Then progress to 3 consecutive sets of 15 repetitions.* Perform this exercise 2 times per day.  *When you easily complete 3 sets of 15, your physician, physical therapist or athletic trainer may advise you to add resistance by wearing a backpack filled with additional weight.  STRENGTH - Plantar Flexors, Seated   Sit on a chair that allows your feet  to rest flat on the ground. If necessary, sit at the edge of the chair.  Keeping your toes firmly on the ground, lift your right / left heel as far as you can without increasing any discomfort in your ankle. Repeat 3 times. Complete this exercise 2 times a day.

## 2019-07-09 NOTE — Progress Notes (Signed)
Subjective: 68 year old female presents the office today for concerns of her toenails becoming thickened elongated slightly been trimmed.  She also states that she was having a lot of pain in the back of her heel over the weekend but it has since improved.  She denies any recent injury or trauma.  No swelling.  She has not been doing the stretching, icing as frequently. Denies any systemic complaints such as fevers, chills, nausea, vomiting. No acute changes since last appointment, and no other complaints at this time.   Objective: AAO x3, NAD DP/PT pulses palpable bilaterally, CRT less than 3 seconds Tenderness to the posterior aspect of the left heel.  There is no pain with compression of calcaneus.  Prominent bone spurs present posteriorly.  No pain in the Achilles tendon [place] Silos test is negative.  Flexor, extensor tendons appear to be intact. Nails are hypertrophic, dystrophic, brittle, discolored, elongated 10. No surrounding redness or drainage. Tenderness nails 1-5 bilaterally. No open lesions or pre-ulcerative lesions are identified today. No open lesions or pre-ulcerative lesions.  No pain with calf compression, swelling, warmth, erythema  Assessment: Posterior heel pain, insertional Achilles tendinitis/bone spur; symptomatic onychomycosis  Plan: -All treatment options discussed with the patient including all alternatives, risks, complications.  -Nails sharp debrided x10 without any complications or bleeding -Her pain is improved from when she made the appointment.  Want her to get back to stretching, icing daily.  Prescribed Voltaren gel.  Heel lift.  Offloading pads.  She was to hold off any surgical intervention.  We also discussed physical therapy. -Patient encouraged to call the office with any questions, concerns, change in symptoms.    Return in about 3 months (around 10/02/2019).  Trula Slade DPM

## 2019-08-06 ENCOUNTER — Encounter: Payer: Self-pay | Admitting: Psychiatry

## 2019-08-06 ENCOUNTER — Ambulatory Visit (INDEPENDENT_AMBULATORY_CARE_PROVIDER_SITE_OTHER): Payer: Medicare Other | Admitting: Psychiatry

## 2019-08-06 DIAGNOSIS — G3184 Mild cognitive impairment, so stated: Secondary | ICD-10-CM | POA: Diagnosis not present

## 2019-08-06 DIAGNOSIS — F5105 Insomnia due to other mental disorder: Secondary | ICD-10-CM

## 2019-08-06 DIAGNOSIS — F4001 Agoraphobia with panic disorder: Secondary | ICD-10-CM

## 2019-08-06 DIAGNOSIS — F3181 Bipolar II disorder: Secondary | ICD-10-CM | POA: Diagnosis not present

## 2019-08-06 DIAGNOSIS — F411 Generalized anxiety disorder: Secondary | ICD-10-CM

## 2019-08-06 DIAGNOSIS — R7989 Other specified abnormal findings of blood chemistry: Secondary | ICD-10-CM

## 2019-08-06 NOTE — Progress Notes (Signed)
[name] [id number] 16-Oct-1950 68 y.o.   Virtual Visit via Bernie  I connected with pt by WebEx and verified that I am speaking with the correct person using two identifiers.   I discussed the limitations, risks, security and privacy concerns of performing an evaluation and management service by Jackquline Denmark and the availability of in person appointments. I also discussed with the patient that there may be a patient responsible charge related to this service. The patient expressed understanding and agreed to proceed.  I discussed the assessment and treatment plan with the patient. The patient was provided an opportunity to ask questions and all were answered. The patient agreed with the plan and demonstrated an understanding of the instructions.   The patient was advised to call back or seek an in-person evaluation if the symptoms worsen or if the condition fails to improve as anticipated.  I provided 30 minutes of video time during this encounter. The call started at 230 and ended at 300. The patient was located at home and the provider was located office.   Subjective:   Patient ID:  [name] is a 68 y.o. (DOB 1951-07-29) female.  Chief Complaint:  Chief Complaint  Patient presents with  . Follow-up    Medication Management  . Anxiety    Medication Management  . Other    Bipolar 2    Anxiety Symptoms include nervous/anxious behavior. Patient reports no confusion, decreased concentration, dizziness or suicidal ideas.        [name] presents to the office today for follow-up of anxiety and depression and poor cognition.  At her visit October 12, 2018.  Because of her poor cognition which did seem to get even worse lately with her memory and given a negative unremarkable medical work-up by her primary care doctor the decision was made to change her from Xanax which she has been on for years to lorazepam.  Lorazepam often has less cognitive side effects than does  alprazolam.  She had a lot of difficulty with headaches and insomnia with the transition.  She called several times in that transition.  Her husband reported that her cognition was better after the transition however.  She was satisfied with the Ativan.  There was an attempt to reduce her lamotrigine to 150 mg also in hopes of improving cognition but it was uncertain at the last visit where there she had actually done so.  At November 26, 2018 and no other med changes were made.  No changes since then except increase in thyroid medication.  Last visit September 2020 without med changes.  She had continued to do cognitively better off alprazolam and on lorazepam.  [name] has prostate and kidney cancer again.  Had surgery and she's cried a lot.  He's incontinent and very uncomfortable.  He usually has positive attitude.   Worry over his health and how she'll do if something happens to him.  She's afraid of Covid.  Trusting in God usually.  She realizes she  Needs to drive occasionally bc of his health.  [name] had prostate CA and lived to 34 yo.  Uncle died of it.  Not markedly depressed. Also stressed over [name] 66 yo not doing school work and causing problems.  Her father [name] is a bad parent and not helpful.  Stressed over [name] and downs too and they have periodic conflict.    Patient reports stable mood and denies unusual depressed or irritable moods.  Guilt tendency.  Patient reports difficulty with sleep initiation or maintenance in part DT anxiety and racing thoughts.Intermittent sleep problems with days and nights mixed up.  Not napping. Not manic.[name] Denies appetite disturbance.  Patient reports that energy and motivation have been good.  Patient denies any difficulty with concentration.  Patient denies any suicidal ideation.  Prior psychiatric medication trials include paroxetine, Lexapro,  risperidone 4 mg which was sedating, aripiprazole, Topamax for anxiety, perphenazine, Seroquel  800, olanzapine for anxiety, and lithium which was not tolerated even at very low dosages.   She was intolerant of both Namenda and Aricept.   Lamotrigine 300.  Xanax.   This is not an exhaustive list.  Review of Systems:  Review of Systems  Musculoskeletal: Positive for arthralgias, back pain and gait problem.  Neurological: Positive for tremors. Negative for dizziness and weakness.  Psychiatric/Behavioral: Negative for agitation, behavioral problems, confusion, decreased concentration, dysphoric mood, hallucinations, self-injury, sleep disturbance and suicidal ideas. The patient is nervous/anxious. The patient is not hyperactive.     Medications: I have reviewed the patient's current medications.  Current Outpatient Medications  Medication Sig Dispense Refill  . amLODipine (NORVASC) 5 MG tablet     . aspirin 81 MG tablet Take 81 mg by mouth daily.      . Aspirin-Calcium Carbonate 81-777 MG TABS Take by mouth.    . busPIRone (BUSPAR) 30 MG tablet Take 1 tablet (30 mg total) by mouth 2 (two) times daily. 180 tablet 3  . cefpodoxime (VANTIN) 100 MG tablet Take 100 mg by mouth 2 (two) times daily.  0  . cephALEXin (KEFLEX) 500 MG capsule Take 1 capsule (500 mg total) by mouth 4 (four) times daily. 20 capsule 0  . ciprofloxacin (CIPRO) 250 MG tablet Take 250 mg by mouth 2 (two) times daily.  0  . diclofenac sodium (VOLTAREN) 1 % GEL Apply 2 g topically 4 (four) times daily. Rub into affected area of foot 2 to 4 times daily 100 g 2  . esomeprazole (NEXIUM) 40 MG capsule Take 1 capsule (40 mg total) by mouth daily. 30 capsule 0  . etodolac (LODINE) 400 MG tablet Take 400 mg by mouth 2 (two) times daily.    . furosemide (LASIX) 20 MG tablet Take 20 mg by mouth daily as needed (for severe swelling).     [name] lamoTRIgine (LAMICTAL) 150 MG tablet TAKE 1 TABLET BY MOUTH  TWICE DAILY 180 tablet 3  . levocetirizine (XYZAL) 5 MG tablet Take 5 mg by mouth every evening.    [name] levothyroxine (SYNTHROID) 88  MCG tablet TAKE 1 TABLET BY MOUTH EVERY MORNING ON AN EMPTY STOMACH    . lisinopril (PRINIVIL,ZESTRIL) 20 MG tablet Take 20 mg by mouth daily.    [name] LORazepam (ATIVAN) 0.5 MG tablet TAKE 1 TABLET BY MOUTH IN  THE MORNING AND 2 TABLETS  IN THE EVENING 270 tablet 0  . Methylfol-Methylcob-Acetylcyst (L-METHYL-MC NAC) 6-2-600 MG TABS TK 1 T PO QD    . phenazopyridine (PYRIDIUM) 200 MG tablet Take 200 mg by mouth 3 (three) times daily as needed for pain.     . polyethylene glycol (MIRALAX / GLYCOLAX) packet Take 17 g by mouth 2 (two) times daily as needed for mild constipation.    . potassium chloride SA (K-DUR,KLOR-CON) 20 MEQ tablet Take 20 mEq by mouth daily.    . QUEtiapine (SEROQUEL XR) 400 MG 24 hr tablet Take 2 tablets (800 mg total) by mouth at bedtime. 180 tablet 3  . QUEtiapine (SEROQUEL)  200 MG tablet Take 200 mg by mouth at bedtime as needed (sleep).    . simvastatin (ZOCOR) 40 MG tablet Take 40 mg by mouth daily.     . Vitamin D, Ergocalciferol, (DRISDOL) 50000 units CAPS capsule Take 50,000 Units by mouth 2 (two) times a week.     . metoprolol succinate (TOPROL-XL) 50 MG 24 hr tablet Take 50 mg by mouth daily. Take with or immediately following a meal.     No current facility-administered medications for this visit.    Medication Side Effects: None  Allergies:  Allergies  Allergen Reactions  . Celexa [Citalopram Hydrobromide] Nausea Only  . Maxzide [Triamterene-Hctz]     Hurt patients kidneys  . Metformin And Related Nausea And Vomiting  . Other Nausea And Vomiting, Nausea Only and Other (See Comments)    Hurt patients kidneys  . Reglan [Metoclopramide] Nausea Only  . Risperdal [Risperidone] Nausea Only    Also causes hallucinations  . Trazodone And Nefazodone Nausea Only  . Zestril [Lisinopril] Nausea Only  . Lithium Palpitations and Other (See Comments)    Shakes and delusional pt started seeing things      Past Medical History:  Diagnosis Date  . Anxiety   .  Arthritis   . Asthma   . Bipolar 1 disorder ([place])   . CHF (congestive heart failure) ([place])   . Complication of anesthesia    left a bad taste in my mouth it has lasted since january   . Depression   . Diabetes mellitus   . Hernia    umbilical  . Hyperlipidemia   . Hypertension   . Hypothyroidism   . Pericarditis, viral 2010 October  . Sleep apnea    uses cpap setting of 15    Family History  Problem Relation Age of Onset  . Diabetes Mother   . Hypertension Mother   . Hyperlipidemia Mother   . Diabetes Father   . Hypertension Father   . Hyperlipidemia Father   . Hypertension Sister     Social History   Socioeconomic History  . Marital status: Married    Spouse name: Not on file  . Number of children: Not on file  . Years of education: Not on file  . Highest education level: Not on file  Occupational History  . Not on file  Tobacco Use  . Smoking status: Never Smoker  . Smokeless tobacco: Never Used  Substance and Sexual Activity  . Alcohol use: No  . Drug use: No  . Sexual activity: Not on file  Other Topics Concern  . Not on file  Social History Narrative  . Not on file   Social Determinants of Health   Financial Resource Strain:   . Difficulty of Paying Living Expenses: Not on file  Food Insecurity:   . Worried About Charity fundraiser in the Last Year: Not on file  . Ran Out of Food in the Last Year: Not on file  Transportation Needs:   . Lack of Transportation (Medical): Not on file  . Lack of Transportation (Non-Medical): Not on file  Physical Activity:   . Days of Exercise per Week: Not on file  . Minutes of Exercise per Session: Not on file  Stress:   . Feeling of Stress : Not on file  Social Connections:   . Frequency of Communication with Friends and Family: Not on file  . Frequency of Social Gatherings with Friends and Family: Not on file  . Attends  Religious Services: Not on file  . Active Member of Clubs or Organizations: Not on file   . Attends Archivist Meetings: Not on file  . Marital Status: Not on file  Intimate Partner Violence:   . Fear of Current or Ex-Partner: Not on file  . Emotionally Abused: Not on file  . Physically Abused: Not on file  . Sexually Abused: Not on file    Past Medical History, Surgical history, Social history, and Family history were reviewed and updated as appropriate.   Please see review of systems for further details on the patient's review from today.   Objective:   Physical Exam:  There were no vitals taken for this visit.  Physical Exam Neurological:     Mental Status: She is alert and oriented to person, place, and time.     Cranial Nerves: No dysarthria.  Psychiatric:        Attention and Perception: Attention and perception normal.        Mood and Affect: Mood is anxious and depressed.        Speech: Speech normal.        Behavior: Behavior is cooperative.        Thought Content: Thought content normal. Thought content is not paranoid or delusional. Thought content does not include homicidal or suicidal ideation. Thought content does not include homicidal or suicidal plan.        Cognition and Memory: Cognition and memory normal.     Comments: Insight fair and chronically stressed. Fair judgment.   Talkative chronically per usual.  Lab Review:     Component Value Date/Time   NA 140 03/23/2016 1933   K 4.4 03/23/2016 1933   CL 106 03/23/2016 1933   CO2 27 03/23/2016 1933   GLUCOSE 143 (H) 03/23/2016 1933   GLUCOSE 154 (H) 09/01/2006 1100   BUN 23 (H) 03/23/2016 1933   CREATININE 0.98 03/23/2016 1933   CREATININE 0.93 07/19/2011 1110   CALCIUM 9.8 03/23/2016 1933   PROT 6.8 01/21/2015 0140   ALBUMIN 3.8 01/21/2015 0140   AST 26 01/21/2015 0140   ALT 25 01/21/2015 0140   ALKPHOS 100 01/21/2015 0140   BILITOT 0.6 01/21/2015 0140   GFRNONAA >60 03/23/2016 1933   GFRAA >60 03/23/2016 1933       Component Value Date/Time   WBC 7.6 03/23/2016 1933    RBC 4.50 03/23/2016 1933   HGB 13.5 03/23/2016 1933   HCT 40.8 03/23/2016 1933   PLT 266 03/23/2016 1933   MCV 90.7 03/23/2016 1933   MCH 30.0 03/23/2016 1933   MCHC 33.1 03/23/2016 1933   RDW 13.6 03/23/2016 1933   LYMPHSABS 2.5 01/21/2015 0140   MONOABS 0.4 01/21/2015 0140   EOSABS 0.2 01/21/2015 0140   BASOSABS 0.1 01/21/2015 0140   Prior lamotrigine level in 2018 on this dosage was 3.6.  Not particularly high.  Per Dr. Marcos Eke: Clinical Impressions: Cognitive complaints are likely due to underlying psychiatric disorder (bipolar disorder/anxiety disorder with racing thoughts). Diagnostic impressions based on test performances are limited due to the patient's severe inability to fully attend to the tasks. However, based on clinical presentation, I highly doubt the presence of a neurodegenerative dementia. It is much more likely that her racing thoughts and psychiatric disorders are interfering with cognitive   No results found for: POCLITH, LITHIUM   No results found for: PHENYTOIN, PHENOBARB, VALPROATE, CBMZ   .res Assessment: Plan:    [name] disorder ([place])  Panic disorder with  agoraphobia  Generalized anxiety disorder  Mild cognitive impairment  Insomnia due to mental condition  Low vitamin D level   Thayer Headings has chronic severe anxiety with panic attacks as well as bipolar disorder with a history of significant lability.  Overall mood lability has been improved with the current med med regimen.  She remains chronically anxious.  She also has mild cognitive impairment as a complicating factor.  She needs her husband's assistance to manage her medications.  At the visit in February Cornelious is more acutely confused recently for no clear reason.   She had a negative medical work-up for causes of short-term memory problems and confusion.  Therefore the decision was made to try switching her from alprazolam to lorazepam at the lowest possible dose in hopes of improving her  cognition.  This was successful and her cognition was improved significantly and her husband verified this.  Her anxiety was not worse at her last visit in April.  No changes in meds indicated.  Discussed potential metabolic side effects associated with atypical antipsychotics, as well as potential risk for movement side effects. Advised pt to contact office if movement side effects occur.   We discussed the short-term risks associated with benzodiazepines including sedation and increased fall risk among others.  Discussed long-term side effect risk including dependence, potential withdrawal symptoms, and the potential eventual dose-related risk of dementia.  Use of lorazepam remains medically necessary to manage her panic disorder.  Sleep hygiene discussed in detail.  She is having some trouble related to anxiety.  Pt is chronically needy and requires extended appts DT chronic anxiety and easily stressed.  Supportive and cognitive work are done with the patient on her excessive guilt.  Also stressed  By D and GD and how to deal with GD's school refusal.  For example taking on responsibility for things with her granddaughter that she can fact not control.  Also addressing her fears related to her husband's diagnosis of cancer.  this appt was 35 mins.  FU 2 - 3 mos  [name], MD, DFAPA   Please see After Visit Summary for patient specific instructions.  Future Appointments  Date Time Provider [place]  10/03/2019  2:45 PM Trula Slade, DPM [facility]    No orders of the defined types were placed in this encounter.     -------------------------------

## 2019-09-01 ENCOUNTER — Other Ambulatory Visit: Payer: Self-pay | Admitting: Psychiatry

## 2019-09-01 NOTE — Telephone Encounter (Signed)
Next apt 09/30/2019

## 2019-09-24 ENCOUNTER — Other Ambulatory Visit: Payer: Self-pay

## 2019-09-24 MED ORDER — VITAMIN D (ERGOCALCIFEROL) 1.25 MG (50000 UNIT) PO CAPS: 50000.0000 [IU] | ORAL_CAPSULE | ORAL | 2 refills | Status: DC

## 2019-09-24 MED ORDER — LAMOTRIGINE 150 MG PO TABS: 150.0000 mg | ORAL_TABLET | Freq: Two times a day (BID) | ORAL | 3 refills | Status: DC

## 2019-09-24 MED ORDER — BUSPIRONE HCL 30 MG PO TABS: 30.0000 mg | ORAL_TABLET | Freq: Two times a day (BID) | ORAL | 3 refills | Status: DC

## 2019-09-30 ENCOUNTER — Encounter: Payer: Self-pay | Admitting: Psychiatry

## 2019-09-30 ENCOUNTER — Ambulatory Visit (INDEPENDENT_AMBULATORY_CARE_PROVIDER_SITE_OTHER): Payer: Medicare Other | Admitting: Psychiatry

## 2019-09-30 DIAGNOSIS — F4001 Agoraphobia with panic disorder: Secondary | ICD-10-CM | POA: Diagnosis not present

## 2019-09-30 DIAGNOSIS — G3184 Mild cognitive impairment, so stated: Secondary | ICD-10-CM | POA: Diagnosis not present

## 2019-09-30 DIAGNOSIS — F3181 Bipolar II disorder: Secondary | ICD-10-CM | POA: Diagnosis not present

## 2019-09-30 DIAGNOSIS — F5105 Insomnia due to other mental disorder: Secondary | ICD-10-CM

## 2019-09-30 DIAGNOSIS — F411 Generalized anxiety disorder: Secondary | ICD-10-CM

## 2019-09-30 NOTE — Progress Notes (Signed)
[name] [id number] 28-Jun-1951 69 y.o.   Virtual Visit via [place]  I connected with pt by WebEx and verified that I am speaking with the correct person using two identifiers.   I discussed the limitations, risks, security and privacy concerns of performing an evaluation and management service by Jackquline Denmark and the availability of in person appointments. I also discussed with the patient that there may be a patient responsible charge related to this service. The patient expressed understanding and agreed to proceed.  I discussed the assessment and treatment plan with the patient. The patient was provided an opportunity to ask questions and all were answered. The patient agreed with the plan and demonstrated an understanding of the instructions.   The patient was advised to call back or seek an in-person evaluation if the symptoms worsen or if the condition fails to improve as anticipated.  I provided 30 minutes of video time during this encounter. The call started at 330 and ended at 400. The patient was located at home and the provider was located office.   Subjective:   Patient ID:  [name] is a 69 y.o. (DOB Jan 03, 1951) female.  Chief Complaint:  Chief Complaint  Patient presents with  . Follow-up    Medication Management  . Other    Bilpolar 2  . Anxiety    Anxiety Symptoms include nervous/anxious behavior. Patient reports no confusion, decreased concentration, dizziness or suicidal ideas.        [name] presents to the office today for follow-up of anxiety and depression and poor cognition.  At her visit October 12, 2018.  Because of her poor cognition which did seem to get even worse lately with her memory and given a negative unremarkable medical work-up by her primary care doctor the decision was made to change her from Xanax which she has been on for years to lorazepam.  Lorazepam often has less cognitive side effects than does alprazolam.  She had a lot  of difficulty with headaches and insomnia with the transition.  She called several times in that transition.  Her husband reported that her cognition was better after the transition however.  She was satisfied with the Ativan.  There was an attempt to reduce her lamotrigine to 150 mg also in hopes of improving cognition but it was uncertain at the last visit where there she had actually done so.  Last visit December 2020 without med changes.  She had continued to do cognitively better off alprazolam and on lorazepam.  Better than December visit.  Feeling good and less down.  Adjusting time of med helped excessively sleep.  Brief hypomanic episode resolved where she cleaned all night.  Sleep 10 hours   [name] has prostate and kidney cancer again.  Had surgery June 10, 2019 and she's cried a lot.  He's incontinent and very uncomfortable.  He usually has positive attitude.   Worry over his health and how she'll do if something happens to him.  She's afraid of Covid.  Trusting in God usually.  She realizes she  Needs to drive occasionally bc of his health.  [name] had prostate CA and lived to 82 yo.  Uncle died of it.  Not markedly depressed.  Can't walk much dT plantar fascitis. Also stressed over [name] 67 yo not doing school work and causing problems.  Her father [name] is a bad parent and not helpful.  Stressed over [name] and downs too and they have periodic conflict.  Patient reports stable mood and denies unusual depressed or irritable moods.  Guilt tendency.   Patient reports difficulty with sleep initiation or maintenance in part DT anxiety and racing thoughts.Intermittent sleep problems with days and nights mixed up.  Not napping. Not manic.[name] Denies appetite disturbance.  Patient reports that energy and motivation have been good.  Patient has some difficulty with concentration.  Chronic memory problems.   Patient denies any suicidal ideation.  Prior psychiatric medication trials  include paroxetine, Lexapro,  risperidone 4 mg which was sedating, aripiprazole,  perphenazine, Seroquel 800, olanzapine for anxiety, and  lithium which was not tolerated even at very low dosages.  Topamax for anxiety, She was intolerant of both Namenda and Aricept.   Lamotrigine 300.   Xanax, Ativan.   This is not an exhaustive list.  Review of Systems:  Review of Systems  Musculoskeletal: Positive for arthralgias, back pain and gait problem.  Neurological: Negative for dizziness, tremors and weakness.  Psychiatric/Behavioral: Negative for agitation, behavioral problems, confusion, decreased concentration, dysphoric mood, hallucinations, self-injury, sleep disturbance and suicidal ideas. The patient is nervous/anxious. The patient is not hyperactive.     Medications: I have reviewed the patient's current medications.  Current Outpatient Medications  Medication Sig Dispense Refill  . amLODipine (NORVASC) 5 MG tablet     . aspirin 81 MG tablet Take 81 mg by mouth daily.      . Aspirin-Calcium Carbonate 81-777 MG TABS Take by mouth.    . busPIRone (BUSPAR) 30 MG tablet Take 1 tablet (30 mg total) by mouth 2 (two) times daily. 180 tablet 3  . cefpodoxime (VANTIN) 100 MG tablet Take 100 mg by mouth 2 (two) times daily.  0  . cephALEXin (KEFLEX) 500 MG capsule Take 1 capsule (500 mg total) by mouth 4 (four) times daily. 20 capsule 0  . ciprofloxacin (CIPRO) 250 MG tablet Take 250 mg by mouth 2 (two) times daily.  0  . diclofenac sodium (VOLTAREN) 1 % GEL Apply 2 g topically 4 (four) times daily. Rub into affected area of foot 2 to 4 times daily 100 g 2  . esomeprazole (NEXIUM) 40 MG capsule Take 1 capsule (40 mg total) by mouth daily. 30 capsule 0  . etodolac (LODINE) 400 MG tablet Take 400 mg by mouth 2 (two) times daily.    . furosemide (LASIX) 20 MG tablet Take 20 mg by mouth daily as needed (for severe swelling).     [name] lamoTRIgine (LAMICTAL) 150 MG tablet Take 1 tablet (150 mg total)  by mouth 2 (two) times daily. 180 tablet 3  . levocetirizine (XYZAL) 5 MG tablet Take 5 mg by mouth every evening.    [name] levothyroxine (SYNTHROID) 88 MCG tablet TAKE 1 TABLET BY MOUTH EVERY MORNING ON AN EMPTY STOMACH    . lisinopril (PRINIVIL,ZESTRIL) 20 MG tablet Take 20 mg by mouth daily.    [name] LORazepam (ATIVAN) 0.5 MG tablet TAKE 1 TABLET BY MOUTH IN  THE MORNING AND 2 TABLETS  IN THE EVENING 270 tablet 0  . Methylfol-Methylcob-Acetylcyst (L-METHYL-MC NAC) 6-2-600 MG TABS TK 1 T PO QD    . metoprolol succinate (TOPROL-XL) 50 MG 24 hr tablet Take 50 mg by mouth daily. Take with or immediately following a meal.    . phenazopyridine (PYRIDIUM) 200 MG tablet Take 200 mg by mouth 3 (three) times daily as needed for pain.     . polyethylene glycol (MIRALAX / GLYCOLAX) packet Take 17 g by mouth 2 (two) times daily  as needed for mild constipation.    . potassium chloride SA (K-DUR,KLOR-CON) 20 MEQ tablet Take 20 mEq by mouth daily.    . QUEtiapine (SEROQUEL XR) 400 MG 24 hr tablet Take 2 tablets (800 mg total) by mouth at bedtime. 180 tablet 3  . QUEtiapine (SEROQUEL) 200 MG tablet Take 200 mg by mouth at bedtime as needed (sleep).    . simvastatin (ZOCOR) 40 MG tablet Take 40 mg by mouth daily.     . Vitamin D, Ergocalciferol, (DRISDOL) 1.25 MG (50000 UNIT) CAPS capsule Take 1 capsule (50,000 Units total) by mouth 2 (two) times a week. 24 capsule 2   No current facility-administered medications for this visit.    Medication Side Effects: None  Allergies:  Allergies  Allergen Reactions  . Celexa [Citalopram Hydrobromide] Nausea Only  . Maxzide [Triamterene-Hctz]     Hurt patients kidneys  . Metformin And Related Nausea And Vomiting  . Other Nausea And Vomiting, Nausea Only and Other (See Comments)    Hurt patients kidneys  . Reglan [Metoclopramide] Nausea Only  . Risperdal [Risperidone] Nausea Only    Also causes hallucinations  . Trazodone And Nefazodone Nausea Only  . Zestril  [Lisinopril] Nausea Only  . Lithium Palpitations and Other (See Comments)    Shakes and delusional pt started seeing things      Past Medical History:  Diagnosis Date  . Anxiety   . Arthritis   . Asthma   . Bipolar 1 disorder ([place])   . CHF (congestive heart failure) ([place])   . Complication of anesthesia    left a bad taste in my mouth it has lasted since january   . Depression   . Diabetes mellitus   . Hernia    umbilical  . Hyperlipidemia   . Hypertension   . Hypothyroidism   . Pericarditis, viral 2010 October  . Sleep apnea    uses cpap setting of 15    Family History  Problem Relation Age of Onset  . Diabetes Mother   . Hypertension Mother   . Hyperlipidemia Mother   . Diabetes Father   . Hypertension Father   . Hyperlipidemia Father   . Hypertension Sister     Social History   Socioeconomic History  . Marital status: Married    Spouse name: Not on file  . Number of children: Not on file  . Years of education: Not on file  . Highest education level: Not on file  Occupational History  . Not on file  Tobacco Use  . Smoking status: Never Smoker  . Smokeless tobacco: Never Used  Substance and Sexual Activity  . Alcohol use: No  . Drug use: No  . Sexual activity: Not on file  Other Topics Concern  . Not on file  Social History Narrative  . Not on file   Social Determinants of Health   Financial Resource Strain:   . Difficulty of Paying Living Expenses: Not on file  Food Insecurity:   . Worried About Charity fundraiser in the Last Year: Not on file  . Ran Out of Food in the Last Year: Not on file  Transportation Needs:   . Lack of Transportation (Medical): Not on file  . Lack of Transportation (Non-Medical): Not on file  Physical Activity:   . Days of Exercise per Week: Not on file  . Minutes of Exercise per Session: Not on file  Stress:   . Feeling of Stress : Not on file  Social Connections:   . Frequency of Communication with Friends and  Family: Not on file  . Frequency of Social Gatherings with Friends and Family: Not on file  . Attends Religious Services: Not on file  . Active Member of Clubs or Organizations: Not on file  . Attends Archivist Meetings: Not on file  . Marital Status: Not on file  Intimate Partner Violence:   . Fear of Current or Ex-Partner: Not on file  . Emotionally Abused: Not on file  . Physically Abused: Not on file  . Sexually Abused: Not on file    Past Medical History, Surgical history, Social history, and Family history were reviewed and updated as appropriate.   Please see review of systems for further details on the patient's review from today.   Objective:   Physical Exam:  There were no vitals taken for this visit.  Physical Exam Neurological:     Mental Status: She is alert and oriented to person, place, and time.     Cranial Nerves: No dysarthria.  Psychiatric:        Attention and Perception: Attention and perception normal.        Mood and Affect: Mood is anxious. Mood is not depressed.        Speech: Speech normal.        Behavior: Behavior is cooperative.        Thought Content: Thought content normal. Thought content is not paranoid or delusional. Thought content does not include homicidal or suicidal ideation. Thought content does not include homicidal or suicidal plan.        Cognition and Memory: Cognition and memory normal.     Comments: Insight fair and chronically stressed. Fair judgment.   Talkative chronically per usual.  Lab Review:     Component Value Date/Time   NA 140 03/23/2016 1933   K 4.4 03/23/2016 1933   CL 106 03/23/2016 1933   CO2 27 03/23/2016 1933   GLUCOSE 143 (H) 03/23/2016 1933   GLUCOSE 154 (H) 09/01/2006 1100   BUN 23 (H) 03/23/2016 1933   CREATININE 0.98 03/23/2016 1933   CREATININE 0.93 07/19/2011 1110   CALCIUM 9.8 03/23/2016 1933   PROT 6.8 01/21/2015 0140   ALBUMIN 3.8 01/21/2015 0140   AST 26 01/21/2015 0140   ALT 25  01/21/2015 0140   ALKPHOS 100 01/21/2015 0140   BILITOT 0.6 01/21/2015 0140   GFRNONAA >60 03/23/2016 1933   GFRAA >60 03/23/2016 1933       Component Value Date/Time   WBC 7.6 03/23/2016 1933   RBC 4.50 03/23/2016 1933   HGB 13.5 03/23/2016 1933   HCT 40.8 03/23/2016 1933   PLT 266 03/23/2016 1933   MCV 90.7 03/23/2016 1933   MCH 30.0 03/23/2016 1933   MCHC 33.1 03/23/2016 1933   RDW 13.6 03/23/2016 1933   LYMPHSABS 2.5 01/21/2015 0140   MONOABS 0.4 01/21/2015 0140   EOSABS 0.2 01/21/2015 0140   BASOSABS 0.1 01/21/2015 0140   Prior lamotrigine level in 2018 on this dosage was 3.6.  Not particularly high.  Per Dr. Marcos Eke: Clinical Impressions: Cognitive complaints are likely due to underlying psychiatric disorder (bipolar disorder/anxiety disorder with racing thoughts). Diagnostic impressions based on test performances are limited due to the patient's severe inability to fully attend to the tasks. However, based on clinical presentation, I highly doubt the presence of a neurodegenerative dementia. It is much more likely that her racing thoughts and psychiatric disorders are interfering with cognitive  No results found for: POCLITH, LITHIUM   No results found for: PHENYTOIN, PHENOBARB, VALPROATE, CBMZ   .res Assessment: Plan:    [name] disorder ([place])  Panic disorder with agoraphobia  Generalized anxiety disorder  Mild cognitive impairment  Insomnia due to mental condition   Thayer Headings has chronic severe anxiety with panic attacks as well as bipolar disorder with a history of significant lability.  Overall mood lability has been improved with the current med med regimen.  She remains chronically anxious.  She also has mild cognitive impairment as a complicating factor.  She needs her husband's assistance to manage her medications.  At the visit in February 2020 Nakhia was more acutely confused recently for no clear reason.   She had a negative medical work-up for causes  of short-term memory problems and confusion.  Therefore the decision was made to try switching her from alprazolam to lorazepam at the lowest possible dose in hopes of improving her cognition.  This was successful and her cognition was improved significantly and her husband verified this.  Her anxiety was not worse at her last visit in April.  No changes in meds indicated.  Discussed potential metabolic side effects associated with atypical antipsychotics, as well as potential risk for movement side effects. Advised pt to contact office if movement side effects occur.   We discussed the short-term risks associated with benzodiazepines including sedation and increased fall risk among others.  Discussed long-term side effect risk including dependence, potential withdrawal symptoms, and the potential eventual dose-related risk of dementia.  Use of lorazepam remains medically necessary to manage her panic disorder.  Sleep hygiene discussed in detail.  She is having some trouble related to anxiety.  Pt is chronically needy and requires extended appts DT chronic anxiety and easily stressed.  Supportive and cognitive work are done with the patient on her excessive guilt.  Also stressed  By D and GD and how to deal with GD's school refusal.  For example taking on responsibility for things with her granddaughter that she can fact not control.  Also addressing her fears related to her husband's diagnosis of cancer.  this appt was 35 mins.  FU  3 mos  [name], MD, DFAPA   Please see After Visit Summary for patient specific instructions.  Future Appointments  Date Time Provider [place]  10/03/2019  2:45 PM Trula Slade, DPM [facility]    No orders of the defined types were placed in this encounter.     -------------------------------

## 2019-10-03 ENCOUNTER — Other Ambulatory Visit: Payer: Self-pay

## 2019-10-03 ENCOUNTER — Ambulatory Visit: Payer: Medicare Other | Admitting: Podiatry

## 2019-10-03 DIAGNOSIS — E1149 Type 2 diabetes mellitus with other diabetic neurological complication: Secondary | ICD-10-CM | POA: Diagnosis not present

## 2019-10-03 DIAGNOSIS — M79675 Pain in left toe(s): Secondary | ICD-10-CM

## 2019-10-03 DIAGNOSIS — M79674 Pain in right toe(s): Secondary | ICD-10-CM

## 2019-10-03 DIAGNOSIS — B351 Tinea unguium: Secondary | ICD-10-CM | POA: Diagnosis not present

## 2019-10-03 NOTE — Patient Instructions (Signed)

## 2019-10-06 ENCOUNTER — Ambulatory Visit: Payer: Medicare Other | Attending: Internal Medicine

## 2019-10-06 DIAGNOSIS — Z23 Encounter for immunization: Secondary | ICD-10-CM | POA: Insufficient documentation

## 2019-10-06 NOTE — Progress Notes (Signed)
   Covid-19 Vaccination Clinic  Name:  [name]    MRN: [id number] DOB: 1950/09/10  10/06/2019  [name] was observed post Covid-19 immunization for 15 minutes without incidence. She was provided with Vaccine Information Sheet and instruction to access the V-Safe system.   [name] was instructed to call 911 with any severe reactions post vaccine: [name] Difficulty breathing  . Swelling of your face and throat  . A fast heartbeat  . A bad rash all over your body  . Dizziness and weakness    Immunizations Administered    Name Date Dose VIS Date Route   Pfizer COVID-19 Vaccine 10/06/2019  2:24 PM 0.3 mL 08/02/2019 Intramuscular   Manufacturer: [place]   Lot: [id number]   [place]: [id number]

## 2019-10-07 NOTE — Progress Notes (Signed)
Subjective: 69 year old female presents the office today for thick, discolored toenails that he cannot trim herself. No redness, drainage or signs of infection. She did get some pain to the back of the achilles tendon a few months ago but it improved and it was not as significant as previous. No pain currently. Denies any systemic complaints such as fevers, chills, nausea, vomiting. No acute changes since last appointment, and no other complaints at this time.   Objective: AAO x3, NAD DP/PT pulses palpable bilaterally, CRT less than 3 seconds There is no tnderness to the posterior aspect of the left heel, in particular there is no pain along the achilles tendon. No pain with lateral compression of the calcaneous.  Nails are hypertrophic, dystrophic, brittle, discolored, elongated 10. No surrounding redness or drainage. Tenderness nails 1-5 bilaterally. No open lesions or pre-ulcerative lesions are identified today. No open lesions or pre-ulcerative lesions.  No pain with calf compression, swelling, warmth, erythema  Assessment: Symptomatic onychomycosis; Achilles tendonitis   Plan: -All treatment options discussed with the patient including all alternatives, risks, complications.  -Nails sharp debrided x10 without any complications or bleeding -Continue stretching exercises daily as well as wearing supportive shoes. Currently no pain but continue to monitor closely.   Return in about 9 weeks (around 12/05/2019) for nail/callus trim .  Trula Slade DPM

## 2019-10-29 ENCOUNTER — Ambulatory Visit: Payer: Medicare Other | Attending: Internal Medicine

## 2019-10-29 DIAGNOSIS — Z23 Encounter for immunization: Secondary | ICD-10-CM

## 2019-10-29 NOTE — Progress Notes (Signed)
   Covid-19 Vaccination Clinic  Name:  [name]    MRN: [id number] DOB: 03-15-1951  10/29/2019  [name] was observed post Covid-19 immunization for 15 minutes without incident. She was provided with Vaccine Information Sheet and instruction to access the V-Safe system.   [name] was instructed to call 911 with any severe reactions post vaccine: [name] Difficulty breathing  . Swelling of face and throat  . A fast heartbeat  . A bad rash all over body  . Dizziness and weakness   Immunizations Administered    Name Date Dose VIS Date Route   Pfizer COVID-19 Vaccine 10/29/2019  2:57 PM 0.3 mL 08/02/2019 Intramuscular   Manufacturer: [place]   Lot: [id number]   [place]: [id number]

## 2019-10-29 NOTE — Progress Notes (Signed)
COVID-19 Vaccine Information can be found at: [email or web address] For questions related to vaccine distribution or appointments, please email [email or web address] or call [phone].    

## 2019-10-30 ENCOUNTER — Ambulatory Visit: Payer: Medicare Other

## 2019-11-07 ENCOUNTER — Other Ambulatory Visit: Payer: Self-pay

## 2019-11-07 ENCOUNTER — Encounter: Payer: Self-pay | Admitting: Physician Assistant

## 2019-11-07 ENCOUNTER — Ambulatory Visit: Payer: Medicare Other | Admitting: Physician Assistant

## 2019-11-07 DIAGNOSIS — L820 Inflamed seborrheic keratosis: Secondary | ICD-10-CM | POA: Diagnosis not present

## 2019-11-07 DIAGNOSIS — L821 Other seborrheic keratosis: Secondary | ICD-10-CM

## 2019-11-07 DIAGNOSIS — Z1283 Encounter for screening for malignant neoplasm of skin: Secondary | ICD-10-CM

## 2019-11-07 NOTE — Progress Notes (Addendum)
New Patient Visit  Subjective  [name] is a 69 y.o. female who presents for the following: Annual Exam (Patient here today for complete skin check.  Patient has moles all over that are bothering her.).  Objective  Well appearing patient in no apparent distress; mood and affect are within normal limits.  A full examination was performed including scalp, head, eyes, ears, nose, lips, neck, chest, axillae, abdomen, back, buttocks, bilateral upper extremities, bilateral lower extremities, hands, feet, fingers, toes, fingernails, and toenails. All findings within normal limits unless otherwise noted below.  Objective  Left Parietal Scalp, Left Parietal Scalp anterior, Mid Forehead: Erythematous stuck-on, waxy papule or plaque.   No measurement per KRS  Left Anterior Neck (2), Left Breast, Left Upper Back, Neck - Posterior, Right Anterior Neck, Right Breast, Right Upper Back, right side neck (2): Stuck-on, waxy papules and plaques. Stuck-on, waxy, tan-brown papules and plaques. --Discussed benign etiology and prognosis.   Images        Assessment & Plan  Seborrheic keratosis, inflamed (13) Neck - Posterior; Left Breast; Right Breast; Left Upper Back; Right Upper Back; Left Parietal Scalp; Left Parietal Scalp anterior; Mid Forehead; Left Anterior Neck (2); Right Anterior Neck; right side neck (2)  Epidermal / dermal shaving - Left Parietal Scalp  Lesion diameter (cm):  1.5 Informed consent: discussed and consent obtained   Timeout: patient name, date of birth, surgical site, and procedure verified   Procedure prep:  Patient was prepped and draped in usual sterile fashion Prep type:  Chlorhexidine Anesthesia: the lesion was anesthetized in a standard fashion   Anesthetic:  1% lidocaine w/ epinephrine 1-100,000 local infiltration Instrument used: flexible razor blade   Outcome: patient tolerated procedure well   Post-procedure details: wound care instructions given     Destruction of lesion - Left Anterior Neck, Left Breast, Left Upper Back, Neck - Posterior, Right Anterior Neck, Right Breast, Right Upper Back, right side neck Complexity: simple   Destruction method: cryotherapy   Informed consent: discussed and consent obtained   Timeout:  patient name, date of birth, surgical site, and procedure verified Lesion destroyed using liquid nitrogen: Yes   Cryotherapy cycles:  1 Outcome: patient tolerated procedure well with no complications   Post-procedure details: wound care instructions given    Epidermal / dermal shaving - Left Parietal Scalp anterior  Lesion diameter (cm):  1.5 Informed consent: discussed and consent obtained   Timeout: patient name, date of birth, surgical site, and procedure verified   Procedure prep:  Patient was prepped and draped in usual sterile fashion Prep type:  Isopropyl alcohol Anesthesia: the lesion was anesthetized in a standard fashion   Anesthetic:  1% lidocaine w/ epinephrine 1-100,000 buffered w/ 8.4% NaHCO3 Instrument used: flexible razor blade   Hemostasis achieved with: pressure, aluminum chloride and electrodesiccation   Outcome: patient tolerated procedure well   Post-procedure details: sterile dressing applied and wound care instructions given   Dressing type: bandage and petrolatum    Epidermal / dermal shaving - Mid Forehead  Lesion diameter (cm):  1.5 Informed consent: discussed and consent obtained   Timeout: patient name, date of birth, surgical site, and procedure verified   Procedure prep:  Patient was prepped and draped in usual sterile fashion Prep type:  Isopropyl alcohol Anesthesia: the lesion was anesthetized in a standard fashion   Anesthetic:  1% lidocaine w/ epinephrine 1-100,000 buffered w/ 8.4% NaHCO3 Instrument used: flexible razor blade   Hemostasis achieved with: pressure, aluminum chloride  and electrodesiccation   Outcome: patient tolerated procedure well   Post-procedure  details: sterile dressing applied and wound care instructions given   Dressing type: bandage and petrolatum    Specimen 1 - Surgical pathology Differential Diagnosis:ISK Check Margins: No Erythematous stuck-on, waxy papule or plaque.  Specimen 2 - Surgical pathology Differential Diagnosis: R/O SK Check Margins: No Erythematous stuck-on, waxy papule or plaque.  Specimen 3 - Surgical pathology Differential Diagnosis: R/O ISK Check Margins: No Erythematous stuck-on, waxy papule or plaque.  Destruction of lesion - Left Anterior Neck, Left Breast, Left Upper Back, Neck - Posterior, Right Anterior Neck, Right Breast, Right Upper Back, right side neck Complexity: simple   Destruction method: cryotherapy   Informed consent: discussed and consent obtained   Timeout:  patient name, date of birth, surgical site, and procedure verified Lesion destroyed using liquid nitrogen: Yes   Cryotherapy cycles:  1 Outcome: patient tolerated procedure well with no complications   Post-procedure details: wound care instructions given     No atypical nevi noted at the time of the visit.

## 2019-11-07 NOTE — Patient Instructions (Signed)

## 2019-11-12 ENCOUNTER — Telehealth: Payer: Self-pay

## 2019-11-12 NOTE — Telephone Encounter (Signed)
-----   Message from Warren Danes, [place] sent at 11/11/2019 12:30 PM EDT ----- All lesions benign. No further tx necessary.

## 2019-11-12 NOTE — Telephone Encounter (Signed)
Phone call to patient's husband and path results given. No treatment needed.

## 2019-11-14 ENCOUNTER — Telehealth: Payer: Self-pay | Admitting: Psychiatry

## 2019-11-14 ENCOUNTER — Other Ambulatory Visit: Payer: Self-pay

## 2019-11-14 MED ORDER — QUETIAPINE FUMARATE ER 400 MG PO TB24: 800.0000 mg | ORAL_TABLET | Freq: Every day | ORAL | 3 refills | Status: DC

## 2019-11-14 NOTE — Telephone Encounter (Signed)
RX for Seroquel XR 400 mg submitted to [facility] for 90 day supply

## 2019-11-14 NOTE — Telephone Encounter (Signed)
Pt would like a refill on Seroquel XR. Please send to [facility].

## 2019-11-17 ENCOUNTER — Other Ambulatory Visit: Payer: Self-pay | Admitting: Psychiatry

## 2019-11-18 NOTE — Telephone Encounter (Signed)
Due back in May  Last refill 01/15

## 2019-12-05 ENCOUNTER — Ambulatory Visit: Payer: Medicare Other | Admitting: Podiatry

## 2019-12-05 ENCOUNTER — Other Ambulatory Visit: Payer: Self-pay

## 2019-12-05 DIAGNOSIS — E1149 Type 2 diabetes mellitus with other diabetic neurological complication: Secondary | ICD-10-CM | POA: Diagnosis not present

## 2019-12-05 DIAGNOSIS — M79675 Pain in left toe(s): Secondary | ICD-10-CM | POA: Diagnosis not present

## 2019-12-05 DIAGNOSIS — B351 Tinea unguium: Secondary | ICD-10-CM

## 2019-12-05 DIAGNOSIS — M79674 Pain in right toe(s): Secondary | ICD-10-CM | POA: Diagnosis not present

## 2019-12-05 NOTE — Patient Instructions (Signed)

## 2019-12-10 NOTE — Progress Notes (Signed)
Subjective: 69 year old female presents the office today for thick, discolored toenails that he cannot trim herself.  She currently denies any redness or drainage from the toenail sites.  She states that the Achilles tendinitis, plantar fasciitis is doing much better.  Gets some occasional discomfort but overall improved.  No other concerns today.  Denies any systemic complaints such as fevers, chills, nausea, vomiting. No acute changes since last appointment, and no other complaints at this time.   Objective: AAO x3, NAD DP/PT pulses palpable bilaterally, CRT less than 3 seconds There is no tnderness to the posterior aspect of the left heel, in particular there is no pain along the achilles tendon or along the course the plantar fascia. No pain with lateral compression of the calcaneous.  Flexor, extensor tendons appear to be intact. Nails are hypertrophic, dystrophic, brittle, discolored, elongated 10. No surrounding redness or drainage. Tenderness nails 1-5 bilaterally. No open lesions or pre-ulcerative lesions are identified today. No open lesions or pre-ulcerative lesions.  No pain with calf compression, swelling, warmth, erythema  Assessment: Symptomatic onychomycosis; Achilles tendonitis   Plan: -All treatment options discussed with the patient including all alternatives, risks, complications.  -Nails sharp debrided x10 without any complications or bleeding -Continue stretching exercises daily as well as wearing supportive shoes. Currently no pain but continue to monitor closely.   Return in about 9 weeks  Trula Slade DPM

## 2019-12-23 ENCOUNTER — Telehealth (INDEPENDENT_AMBULATORY_CARE_PROVIDER_SITE_OTHER): Payer: Medicare Other | Admitting: Psychiatry

## 2019-12-23 ENCOUNTER — Encounter: Payer: Self-pay | Admitting: Psychiatry

## 2019-12-23 DIAGNOSIS — F411 Generalized anxiety disorder: Secondary | ICD-10-CM

## 2019-12-23 DIAGNOSIS — F3181 Bipolar II disorder: Secondary | ICD-10-CM

## 2019-12-23 DIAGNOSIS — R7989 Other specified abnormal findings of blood chemistry: Secondary | ICD-10-CM

## 2019-12-23 DIAGNOSIS — G3184 Mild cognitive impairment, so stated: Secondary | ICD-10-CM | POA: Diagnosis not present

## 2019-12-23 DIAGNOSIS — F4001 Agoraphobia with panic disorder: Secondary | ICD-10-CM | POA: Diagnosis not present

## 2019-12-23 DIAGNOSIS — F5105 Insomnia due to other mental disorder: Secondary | ICD-10-CM

## 2019-12-23 DIAGNOSIS — F05 Delirium due to known physiological condition: Secondary | ICD-10-CM

## 2019-12-23 NOTE — Progress Notes (Signed)
[name] [id number] February 28, 1951 69 y.o.   Virtual Visit via [place]  I connected with pt by WebEx and verified that I am speaking with the correct person using two identifiers.   I discussed the limitations, risks, security and privacy concerns of performing an evaluation and management service by Jackquline [facility] and the availability of in person appointments. I also discussed with the patient that there may be a patient responsible charge related to this service. The patient expressed understanding and agreed to proceed.  I discussed the assessment and treatment plan with the patient. The patient was provided an opportunity to ask questions and all were answered. The patient agreed with the plan and demonstrated an understanding of the instructions.   The patient was advised to call back or seek an in-person evaluation if the symptoms worsen or if the condition fails to improve as anticipated.  I provided 30 minutes of video time during this encounter. The call started at 300 and ended at 330. The patient was located at home and the provider was located office.   Subjective:   Patient ID:  [name] is a 69 y.o. (DOB 1951/05/07) female.  Chief Complaint:  No chief complaint on file.   Anxiety Symptoms include nervous/anxious behavior. Patient reports no confusion, decreased concentration, dizziness or suicidal ideas.        [name] presents to the office today for follow-up of anxiety and depression and poor cognition.  At her visit October 12, 2018.  Because of her poor cognition which did seem to get even worse lately with her memory and given a negative unremarkable medical work-up by her primary care doctor the decision was made to change her from Xanax which she has been on for years to lorazepam.  Lorazepam often has less cognitive side effects than does alprazolam.  She had a lot of difficulty with headaches and insomnia with the transition.  She called several times in  that transition.  Her husband reported that her cognition was better after the transition however.  She was satisfied with the Ativan.  There was an attempt to reduce her lamotrigine to 150 mg also in hopes of improving cognition but it was uncertain at the last visit where there she had actually done so.  visit December 2020 without med changes.  She had continued to do cognitively better off alprazolam and on lorazepam.  Last seen March 2021.   Better than December visit.  Feeling good and less down.  Adjusting time of med helped excessively sleep.  Brief hypomanic episode resolved where she cleaned all night.  Sleep 10 hours . No med changes.  12/23/19 appt.  Noted: More depression, crying and racing thoughts and distressed to the point of wondering if needed the hospital.  Sleep got worse and needed Seroquel IR 200 also bc went 2 days without sleep.  It worked and helped sleep.  Worry a lot and melancholy this week.  Cries over what happens dealing with her daugher.[name] has prostate and kidney cancer again.  Had surgery June 10, 2019 and she's cried a lot.  He's incontinent and very uncomfortable.  He usually has positive attitude.   Worry over his health and how she'll do if something happens to him.  She's afraid of Covid.  Trusting in God usually.  She realizes she  Needs to drive occasionally bc of his health.  [name] had prostate CA and lived to 76 yo.  Uncle died of it.  Not markedly depressed.  Can't walk much dT plantar fascitis. Also stressed over [name] 67 yo not doing school work and causing problems.  Her father [name] is a bad parent and not helpful.  Stressed over [name] and downs too and they have periodic conflict.    More down and anxious.   Guilt tendency.   Patient reports difficulty with sleep initiation or maintenance in part DT anxiety and racing thoughts.Intermittent sleep problems with days and nights mixed up.  Not napping. Not manic.[name] Denies appetite  disturbance.  Patient reports that energy and motivation have been good.  Patient has some difficulty with concentration.  Chronic memory problems.   Patient denies any suicidal ideation.  Prior psychiatric medication trials include paroxetine, Lexapro,  risperidone 4 mg which was sedating, aripiprazole,  perphenazine, Seroquel 800, olanzapine for anxiety, and  lithium which was not tolerated even at very low dosages.  Topamax for anxiety, She was intolerant of both Namenda and Aricept.   Lamotrigine 300.   Xanax, Ativan.   This is not an exhaustive list.  Review of Systems:  Review of Systems  Musculoskeletal: Positive for arthralgias, back pain and gait problem.  Neurological: Negative for dizziness, tremors and weakness.  Psychiatric/Behavioral: Negative for agitation, behavioral problems, confusion, decreased concentration, dysphoric mood, hallucinations, self-injury, sleep disturbance and suicidal ideas. The patient is nervous/anxious. The patient is not hyperactive.     Medications: I have reviewed the patient's current medications.  Current Outpatient Medications  Medication Sig Dispense Refill  . amLODipine (NORVASC) 5 MG tablet     . aspirin 81 MG tablet Take 81 mg by mouth daily.      . busPIRone (BUSPAR) 30 MG tablet Take 1 tablet (30 mg total) by mouth 2 (two) times daily. 180 tablet 3  . cefpodoxime (VANTIN) 100 MG tablet Take 100 mg by mouth 2 (two) times daily.  0  . cephALEXin (KEFLEX) 250 MG capsule Take 250 mg by mouth at bedtime.    . diclofenac sodium (VOLTAREN) 1 % GEL Apply 2 g topically 4 (four) times daily. Rub into affected area of foot 2 to 4 times daily 100 g 2  . esomeprazole (NEXIUM) 40 MG capsule Take 1 capsule (40 mg total) by mouth daily. 30 capsule 0  . etodolac (LODINE) 400 MG tablet Take 400 mg by mouth 2 (two) times daily.    . furosemide (LASIX) 20 MG tablet Take 20 mg by mouth daily as needed (for severe swelling).     [name] lamoTRIgine (LAMICTAL)  150 MG tablet Take 1 tablet (150 mg total) by mouth 2 (two) times daily. 180 tablet 3  . lansoprazole (PREVACID) 15 MG capsule Take 15 mg by mouth daily.    [name] levocetirizine (XYZAL) 5 MG tablet Take 5 mg by mouth every evening.    [name] levothyroxine (SYNTHROID) 88 MCG tablet TAKE 1 TABLET BY MOUTH EVERY MORNING ON AN EMPTY STOMACH    . lisinopril (PRINIVIL,ZESTRIL) 20 MG tablet Take 20 mg by mouth daily.    [name] LORazepam (ATIVAN) 0.5 MG tablet TAKE 1 TABLET BY MOUTH IN  THE MORNING AND 2 TABLETS  IN THE EVENING 270 tablet 0  . meloxicam (MOBIC) 15 MG tablet Take 15 mg by mouth daily.    . Methylfol-Methylcob-Acetylcyst (L-METHYL-MC NAC) 6-2-600 MG TABS TK 1 T PO QD    . metoprolol succinate (TOPROL-XL) 50 MG 24 hr tablet Take 50 mg by mouth daily. Take with or immediately following a meal.    .  phenazopyridine (PYRIDIUM) 200 MG tablet Take 200 mg by mouth 3 (three) times daily as needed for pain.     . polyethylene glycol (MIRALAX / GLYCOLAX) packet Take 17 g by mouth 2 (two) times daily as needed for mild constipation.    . potassium chloride SA (K-DUR,KLOR-CON) 20 MEQ tablet Take 20 mEq by mouth daily.    . QUEtiapine (SEROQUEL XR) 400 MG 24 hr tablet Take 2 tablets (800 mg total) by mouth at bedtime. 180 tablet 3  . QUEtiapine (SEROQUEL) 200 MG tablet Take 200 mg by mouth at bedtime as needed (sleep).    . simvastatin (ZOCOR) 40 MG tablet Take 40 mg by mouth daily.     . Vitamin D, Ergocalciferol, (DRISDOL) 1.25 MG (50000 UNIT) CAPS capsule Take 1 capsule (50,000 Units total) by mouth 2 (two) times a week. 24 capsule 2   No current facility-administered medications for this visit.    Medication Side Effects: None  Allergies:  Allergies  Allergen Reactions  . Celexa [Citalopram Hydrobromide] Nausea Only  . Maxzide [Triamterene-Hctz]     Hurt patients kidneys  . Metformin And Related Nausea And Vomiting  . Other Nausea And Vomiting, Nausea Only and Other (See Comments)    Hurt patients  kidneys  . Reglan [Metoclopramide] Nausea Only  . Risperdal [Risperidone] Nausea Only    Also causes hallucinations  . Trazodone And Nefazodone Nausea Only  . Zestril [Lisinopril] Nausea Only  . Lithium Palpitations and Other (See Comments)    Shakes and delusional pt started seeing things      Past Medical History:  Diagnosis Date  . Anxiety   . Arthritis   . Asthma   . Bipolar 1 disorder ([place])   . CHF (congestive heart failure) ([place])   . Complication of anesthesia    left a bad taste in my mouth it has lasted since january   . Depression   . Diabetes mellitus   . Hernia    umbilical  . Hyperlipidemia   . Hypertension   . Hypothyroidism   . Pericarditis, viral 2010 October  . Sleep apnea    uses cpap setting of 15    Family History  Problem Relation Age of Onset  . Diabetes Mother   . Hypertension Mother   . Hyperlipidemia Mother   . Diabetes Father   . Hypertension Father   . Hyperlipidemia Father   . Hypertension Sister     Social History   Socioeconomic History  . Marital status: Married    Spouse name: Not on file  . Number of children: Not on file  . Years of education: Not on file  . Highest education level: Not on file  Occupational History  . Not on file  Tobacco Use  . Smoking status: Never Smoker  . Smokeless tobacco: Never Used  Substance and Sexual Activity  . Alcohol use: No  . Drug use: No  . Sexual activity: Not on file  Other Topics Concern  . Not on file  Social History Narrative  . Not on file   Social Determinants of Health   Financial Resource Strain:   . Difficulty of Paying Living Expenses:   Food Insecurity:   . Worried About Charity fundraiser in the Last Year:   . Arboriculturist in the Last Year:   Transportation Needs:   . Film/video editor (Medical):   [name] Lack of Transportation (Non-Medical):   Physical Activity:   . Days of Exercise  per Week:   . Minutes of Exercise per Session:   Stress:   . Feeling of  Stress :   Social Connections:   . Frequency of Communication with Friends and Family:   . Frequency of Social Gatherings with Friends and Family:   . Attends Religious Services:   . Active Member of Clubs or Organizations:   . Attends Archivist Meetings:   [name] Marital Status:   Intimate Partner Violence:   . Fear of Current or Ex-Partner:   . Emotionally Abused:   [name] Physically Abused:   . Sexually Abused:     Past Medical History, Surgical history, Social history, and Family history were reviewed and updated as appropriate.   Please see review of systems for further details on the patient's review from today.   Objective:   Physical Exam:  There were no vitals taken for this visit.  Physical Exam Neurological:     Mental Status: She is alert and oriented to person, place, and time.     Cranial Nerves: No dysarthria.  Psychiatric:        Attention and Perception: Attention and perception normal.        Mood and Affect: Mood is anxious. Mood is not depressed.        Speech: Speech normal.        Behavior: Behavior is cooperative.        Thought Content: Thought content normal. Thought content is not paranoid or delusional. Thought content does not include homicidal or suicidal ideation. Thought content does not include homicidal or suicidal plan.        Cognition and Memory: Cognition and memory normal.     Comments: Insight fair and chronically stressed. Fair judgment.   Talkative chronically per usual.  Lab Review:     Component Value Date/Time   NA 140 03/23/2016 1933   K 4.4 03/23/2016 1933   CL 106 03/23/2016 1933   CO2 27 03/23/2016 1933   GLUCOSE 143 (H) 03/23/2016 1933   GLUCOSE 154 (H) 09/01/2006 1100   BUN 23 (H) 03/23/2016 1933   CREATININE 0.98 03/23/2016 1933   CREATININE 0.93 07/19/2011 1110   CALCIUM 9.8 03/23/2016 1933   PROT 6.8 01/21/2015 0140   ALBUMIN 3.8 01/21/2015 0140   AST 26 01/21/2015 0140   ALT 25 01/21/2015 0140   ALKPHOS  100 01/21/2015 0140   BILITOT 0.6 01/21/2015 0140   GFRNONAA >60 03/23/2016 1933   GFRAA >60 03/23/2016 1933       Component Value Date/Time   WBC 7.6 03/23/2016 1933   RBC 4.50 03/23/2016 1933   HGB 13.5 03/23/2016 1933   HCT 40.8 03/23/2016 1933   PLT 266 03/23/2016 1933   MCV 90.7 03/23/2016 1933   MCH 30.0 03/23/2016 1933   MCHC 33.1 03/23/2016 1933   RDW 13.6 03/23/2016 1933   LYMPHSABS 2.5 01/21/2015 0140   MONOABS 0.4 01/21/2015 0140   EOSABS 0.2 01/21/2015 0140   BASOSABS 0.1 01/21/2015 0140   Prior lamotrigine level in 2018 on this dosage was 3.6.  Not particularly high.  Per Dr. Marcos Eke: Clinical Impressions: Cognitive complaints are likely due to underlying psychiatric disorder (bipolar disorder/anxiety disorder with racing thoughts). Diagnostic impressions based on test performances are limited due to the patient's severe inability to fully attend to the tasks. However, based on clinical presentation, I highly doubt the presence of a neurodegenerative dementia. It is much more likely that her racing thoughts and psychiatric disorders are interfering with  cognitive   No results found for: POCLITH, LITHIUM   No results found for: PHENYTOIN, PHENOBARB, VALPROATE, CBMZ   .res Assessment: Plan:    [name] disorder ([place])  Panic disorder with agoraphobia  Generalized anxiety disorder  Mild cognitive impairment  Insomnia due to mental condition  Acute confusional state  Low vitamin D level   Thayer Headings has chronic severe anxiety with panic attacks as well as bipolar disorder with a history of significant lability.  Overall mood lability has been improved with the current med med regimen.  She remains chronically anxious.  She also has mild cognitive impairment as a complicating factor.  She needs her husband's assistance to manage her medications.  At the visit in February 2020 Ol[place]emi was more acutely confused recently for no clear reason.   She had a negative  medical work-up for causes of short-term memory problems and confusion.  Therefore the decision was made to try switching her from alprazolam to lorazepam at the lowest possible dose in hopes of improving her cognition.  This was successful and her cognition was improved significantly and her husband verified this.    No changes in meds indicated except agree to take the extra Seroquel 200 IR HS for mixed manic sx that are worse right now.  May be having mixed seasonal sx.  Discussed potential metabolic side effects associated with atypical antipsychotics, as well as potential risk for movement side effects. Advised pt to contact office if movement side effects occur.   We discussed the short-term risks associated with benzodiazepines including sedation and increased fall risk among others.  Discussed long-term side effect risk including dependence, potential withdrawal symptoms, and the potential eventual dose-related risk of dementia.  Use of lorazepam remains medically necessary to manage her panic disorder.  Sleep hygiene discussed in detail.  She is having some trouble related to anxiety.  Pt is chronically needy and requires extended appts DT chronic anxiety and easily stressed.  Supportive and cognitive work are done with the patient on her excessive guilt.  Also stressed  By D and GD and how to deal with GD's school refusal.  For example taking on responsibility for things with her granddaughter that she can fact not control.  Also addressing her fears related to her husband's diagnosis of cancer. Supportive therapy dealing with chronic stressors and difficulty with daughter.  Encourage giving daughter more space.  this appt was 35 mins.  FU  2 mos  [name], MD, DFAPA   Please see After Visit Summary for patient specific instructions.  Future Appointments  Date Time Provider [place]  02/04/2020  2:45 PM Trula Slade, DPM [facility]    No orders of the  defined types were placed in this encounter.     -------------------------------

## 2020-01-02 ENCOUNTER — Telehealth: Payer: Self-pay | Admitting: Psychiatry

## 2020-01-02 ENCOUNTER — Other Ambulatory Visit: Payer: Self-pay

## 2020-01-02 MED ORDER — QUETIAPINE FUMARATE ER 400 MG PO TB24: 800.0000 mg | ORAL_TABLET | Freq: Every day | ORAL | 3 refills | Status: DC

## 2020-01-02 NOTE — Telephone Encounter (Signed)
Rx sent 

## 2020-01-02 NOTE — Telephone Encounter (Signed)
Pt requesting a refill on the Seroquel 200 mg. Fill through [facility]. Next appt 7/1.

## 2020-01-13 ENCOUNTER — Other Ambulatory Visit: Payer: Self-pay

## 2020-01-13 MED ORDER — QUETIAPINE FUMARATE 200 MG PO TABS: 200.0000 mg | ORAL_TABLET | Freq: Every evening | ORAL | 1 refills | Status: DC | PRN

## 2020-01-13 NOTE — Telephone Encounter (Signed)
Rx sent 

## 2020-01-13 NOTE — Telephone Encounter (Signed)
Thayer Headings called to again request a refill for the seroquel 200mg  to be sent to [facility].  The XR 400mg  was sent but she also needs the 200mg  to go wih the 400mg . Please send this to [facility] as well.

## 2020-01-22 ENCOUNTER — Other Ambulatory Visit: Payer: Self-pay | Admitting: Family Medicine

## 2020-01-22 DIAGNOSIS — Z1231 Encounter for screening mammogram for malignant neoplasm of breast: Secondary | ICD-10-CM

## 2020-02-04 ENCOUNTER — Other Ambulatory Visit: Payer: Self-pay

## 2020-02-04 ENCOUNTER — Ambulatory Visit: Payer: Medicare Other | Admitting: Podiatry

## 2020-02-04 DIAGNOSIS — E1149 Type 2 diabetes mellitus with other diabetic neurological complication: Secondary | ICD-10-CM | POA: Diagnosis not present

## 2020-02-04 DIAGNOSIS — M79675 Pain in left toe(s): Secondary | ICD-10-CM

## 2020-02-04 DIAGNOSIS — M79674 Pain in right toe(s): Secondary | ICD-10-CM | POA: Diagnosis not present

## 2020-02-04 DIAGNOSIS — B351 Tinea unguium: Secondary | ICD-10-CM

## 2020-02-10 ENCOUNTER — Other Ambulatory Visit: Payer: Self-pay | Admitting: Psychiatry

## 2020-02-10 NOTE — Progress Notes (Signed)
Subjective: 69 year old female presents the office today for thick, discolored toenails that he cannot trim herself.  Denies any drainage or pus or any other new concerns.  Gets occasional right heel tenderness but not very good shoes or with significant activity but otherwise she been doing well.  No recent injury or falls.  No other concerns today.  Denies any systemic complaints such as fevers, chills, nausea, vomiting.   Objective: AAO x3, NAD DP/PT pulses palpable bilaterally, CRT less than 3 seconds There is no tnderness today along the posterior aspect of the left heel.  No tenderness to Achilles tendon or along the plantar fascia.  No pain with lateral compression of calcaneus.  Nails are hypertrophic, dystrophic, brittle, discolored, elongated 10. No surrounding redness or drainage. Tenderness nails 1-5 bilaterally. No open lesions or pre-ulcerative lesions are identified today. No open lesions or pre-ulcerative lesions.  No pain with calf compression, swelling, warmth, erythema  Assessment: Symptomatic onychomycosis; Achilles tendonitis/posterior heel pain  Plan: -All treatment options discussed with the patient including all alternatives, risks, complications.  -Nails sharp debrided x10 without any complications or bleeding -Continue stretching exercises daily as well as wearing supportive shoes.  She can use a heel lift inside of her regular shoes as well.  Return in about 9 weeks  Trula Slade DPM

## 2020-02-12 ENCOUNTER — Ambulatory Visit
Admission: RE | Admit: 2020-02-12 | Discharge: 2020-02-12 | Disposition: A | Discharge status: Home / Self Care | Payer: Medicare Other | Source: Ambulatory Visit | Attending: Family Medicine | Admitting: Family Medicine

## 2020-02-12 ENCOUNTER — Other Ambulatory Visit: Payer: Self-pay

## 2020-02-12 DIAGNOSIS — Z1231 Encounter for screening mammogram for malignant neoplasm of breast: Secondary | ICD-10-CM

## 2020-02-20 ENCOUNTER — Ambulatory Visit (INDEPENDENT_AMBULATORY_CARE_PROVIDER_SITE_OTHER): Payer: Medicare Other | Admitting: Psychiatry

## 2020-02-20 ENCOUNTER — Encounter: Payer: Self-pay | Admitting: Psychiatry

## 2020-02-20 DIAGNOSIS — F3181 Bipolar II disorder: Secondary | ICD-10-CM | POA: Diagnosis not present

## 2020-02-20 DIAGNOSIS — F411 Generalized anxiety disorder: Secondary | ICD-10-CM | POA: Diagnosis not present

## 2020-02-20 DIAGNOSIS — G3184 Mild cognitive impairment, so stated: Secondary | ICD-10-CM | POA: Diagnosis not present

## 2020-02-20 DIAGNOSIS — F4001 Agoraphobia with panic disorder: Secondary | ICD-10-CM

## 2020-02-20 DIAGNOSIS — F5105 Insomnia due to other mental disorder: Secondary | ICD-10-CM

## 2020-02-20 NOTE — Progress Notes (Signed)
[name] [id number] [birth year] 69 y.o.    Subjective:   Patient ID:  [name] is a 69 y.o. (DOB [birth year]) female.  Chief Complaint:  Chief Complaint  Patient presents with  . Follow-up  . Anxiety  . Depression    Anxiety Symptoms include nervous/anxious behavior. Patient reports no chest pain, confusion, decreased concentration, dizziness or suicidal ideas.        [name] presents to the office today for follow-up of anxiety and depression and poor cognition.  At her visit October 12, 2018.  Because of her poor cognition which did seem to get even worse lately with her memory and given a negative unremarkable medical work-up by her primary care doctor the decision was made to change her from Xanax which she has been on for years to lorazepam.  Lorazepam often has less cognitive side effects than does alprazolam.  She had a lot of difficulty with headaches and insomnia with the transition.  She called several times in that transition.  Her husband reported that her cognition was better after the transition however.  She was satisfied with the Ativan.  There was an attempt to reduce her lamotrigine to 150 mg also in hopes of improving cognition but it was uncertain at the last visit where there she had actually done so.  visit December 2020 without med changes.  She had continued to do cognitively better off alprazolam and on lorazepam.  seen March 2021.   Better than December visit.  Feeling good and less down.  Adjusting time of med helped excessively sleep.  Brief hypomanic episode resolved where she cleaned all night.  Sleep 10 hours . No med changes.  12/23/19 appt.  Noted: More depression, crying and racing thoughts and distressed to the point of wondering if needed the hospital.  Sleep got worse and needed Seroquel IR 200 also bc went 2 days without sleep.  It worked and helped sleep.  Worry a lot and melancholy this week.  Cries over what happens dealing  with her daugher.[name] has prostate and kidney cancer again.  Had surgery June 10, 2019 and she's cried a lot.  He's incontinent and very uncomfortable.  He usually has positive attitude.   Worry over his health and how she'll do if something happens to him.  She's afraid of Covid.  Trusting in God usually.  She realizes she  Needs to drive occasionally bc of his health.  [name]'s father had prostate CA and lived to 66 yo.  Uncle died of it.  Not markedly depressed. Can't walk much dT plantar fascitis. Also stressed over [name] 69 yo not doing school work and causing problems.  Her father [name] is a bad parent and not helpful.  Stressed over [name] and downs too and they have periodic conflict.   More down and anxious.   Guilt tendency.   Patient reports difficulty with sleep initiation or maintenance in part DT anxiety and racing thoughts.Intermittent sleep problems with days and nights mixed up.  Not napping. Not manic.[name] Denies appetite disturbance.  Patient reports that energy and motivation have been good.  Patient has some difficulty with concentration.  Chronic memory problems.   Patient denies any suicidal ideation. Plan:  No changes in meds indicated except agree to take the extra Seroquel 200 IR HS for mixed manic sx that are worse right now.  May be having mixed seasonal sx.  02/20/2020 appointment with the following noted: Doing fairly good except chronic  worry over D and GD [name].  Some days cries a lot over it.  D having a hard time lately.  Still easily stressed but not more than usual. Stopped extra quetiapine 200 mg HS bc no longer needed it. Anxiety is chronic but at baseline as is depression and not manic now. H helps her take the meds.   Memory is still bad but has been worse when on Xanax instead fo Ativan.  Sleep is fine from 10 to 9 which is much better than in the past.  Occ spells of staying up for 2 days and then needs to take quetiapine.   H [name] goes for FU soon for cancer  check up.  Prior psychiatric medication trials include paroxetine, Lexapro,  risperidone 4 mg which was sedating, aripiprazole,  perphenazine, Seroquel 800, olanzapine for anxiety, and  lithium which was not tolerated even at very low dosages.  Topamax for anxiety, She was intolerant of both Namenda and Aricept.   Lamotrigine 300.   Xanax, Ativan.   This is not an exhaustive list.  Review of Systems:  Review of Systems  Cardiovascular: Negative for chest pain.  Musculoskeletal: Positive for arthralgias, back pain and gait problem.  Neurological: Negative for dizziness, tremors and weakness.  Psychiatric/Behavioral: Negative for agitation, behavioral problems, confusion, decreased concentration, dysphoric mood, hallucinations, self-injury, sleep disturbance and suicidal ideas. The patient is nervous/anxious. The patient is not hyperactive.     Medications: I have reviewed the patient's current medications.  Current Outpatient Medications  Medication Sig Dispense Refill  . amLODipine (NORVASC) 5 MG tablet     . aspirin 81 MG tablet Take 81 mg by mouth daily.      . busPIRone (BUSPAR) 30 MG tablet Take 1 tablet (30 mg total) by mouth 2 (two) times daily. 180 tablet 3  . cefpodoxime (VANTIN) 100 MG tablet Take 100 mg by mouth 2 (two) times daily.  0  . cephALEXin (KEFLEX) 250 MG capsule Take 250 mg by mouth at bedtime.    . diclofenac sodium (VOLTAREN) 1 % GEL Apply 2 g topically 4 (four) times daily. Rub into affected area of foot 2 to 4 times daily 100 g 2  . esomeprazole (NEXIUM) 40 MG capsule Take 1 capsule (40 mg total) by mouth daily. 30 capsule 0  . etodolac (LODINE) 400 MG tablet Take 400 mg by mouth 2 (two) times daily.    . furosemide (LASIX) 20 MG tablet Take 20 mg by mouth daily as needed (for severe swelling).     [name] lamoTRIgine (LAMICTAL) 150 MG tablet Take 1 tablet (150 mg total) by mouth 2 (two) times daily. 180 tablet 3  . lansoprazole (PREVACID) 15 MG capsule Take 15 mg  by mouth daily.    [name] levocetirizine (XYZAL) 5 MG tablet Take 5 mg by mouth every evening.    [name] levothyroxine (SYNTHROID) 88 MCG tablet TAKE 1 TABLET BY MOUTH EVERY MORNING ON AN EMPTY STOMACH    . lisinopril (PRINIVIL,ZESTRIL) 20 MG tablet Take 20 mg by mouth daily.    [name] LORazepam (ATIVAN) 0.5 MG tablet TAKE 1 TABLET BY MOUTH IN  THE MORNING AND 2 TABLETS  IN THE EVENING 270 tablet 0  . meloxicam (MOBIC) 15 MG tablet Take 15 mg by mouth daily.    . Methylfol-Methylcob-Acetylcyst (L-METHYL-MC NAC) 6-2-600 MG TABS TK 1 T PO QD    . metoprolol succinate (TOPROL-XL) 50 MG 24 hr tablet Take 50 mg by mouth daily. Take with or immediately following  a meal.    . phenazopyridine (PYRIDIUM) 200 MG tablet Take 200 mg by mouth 3 (three) times daily as needed for pain.     . polyethylene glycol (MIRALAX / GLYCOLAX) packet Take 17 g by mouth 2 (two) times daily as needed for mild constipation.    . potassium chloride SA (K-DUR,KLOR-CON) 20 MEQ tablet Take 20 mEq by mouth daily.    . QUEtiapine (SEROQUEL XR) 400 MG 24 hr tablet Take 2 tablets (800 mg total) by mouth at bedtime. 180 tablet 3  . simvastatin (ZOCOR) 40 MG tablet Take 40 mg by mouth daily.     . Vitamin D, Ergocalciferol, (DRISDOL) 1.25 MG (50000 UNIT) CAPS capsule TAKE 1 CAPSULE (50,000  UNITS TOTAL) BY MOUTH TWICE WEEKLY 26 capsule 3  . QUEtiapine (SEROQUEL) 200 MG tablet Take 1 tablet (200 mg total) by mouth at bedtime as needed (sleep). (Patient not taking: Reported on 02/20/2020) 90 tablet 1   No current facility-administered medications for this visit.    Medication Side Effects: None  Allergies:  Allergies  Allergen Reactions  . Celexa [Citalopram Hydrobromide] Nausea Only  . Maxzide [Triamterene-Hctz]     Hurt patients kidneys  . Metformin And Related Nausea And Vomiting  . Other Nausea And Vomiting, Nausea Only and Other (See Comments)    Hurt patients kidneys  . Reglan [Metoclopramide] Nausea Only  . Risperdal [Risperidone]  Nausea Only    Also causes hallucinations  . Trazodone And Nefazodone Nausea Only  . Zestril [Lisinopril] Nausea Only  . Lithium Palpitations and Other (See Comments)    Shakes and delusional pt started seeing things      Past Medical History:  Diagnosis Date  . Anxiety   . Arthritis   . Asthma   . Bipolar 1 disorder ([place])   . CHF (congestive heart failure) ([place])   . Complication of anesthesia    left a bad taste in my mouth it has lasted since january   . Depression   . Diabetes mellitus   . Hernia    umbilical  . Hyperlipidemia   . Hypertension   . Hypothyroidism   . Pericarditis, viral 2010 October  . Sleep apnea    uses cpap setting of 15    Family History  Problem Relation Age of Onset  . Diabetes Mother   . Hypertension Mother   . Hyperlipidemia Mother   . Diabetes Father   . Hypertension Father   . Hyperlipidemia Father   . Hypertension Sister     Social History   Socioeconomic History  . Marital status: Married    Spouse name: Not on file  . Number of children: Not on file  . Years of education: Not on file  . Highest education level: Not on file  Occupational History  . Not on file  Tobacco Use  . Smoking status: Never Smoker  . Smokeless tobacco: Never Used  Substance and Sexual Activity  . Alcohol use: No  . Drug use: No  . Sexual activity: Not on file  Other Topics Concern  . Not on file  Social History Narrative  . Not on file   Social Determinants of Health   Financial Resource Strain:   . Difficulty of Paying Living Expenses:   Food Insecurity:   . Worried About Charity fundraiser in the Last Year:   . Arboriculturist in the Last Year:   Transportation Needs:   . Film/video editor (Medical):   [name]  Lack of Transportation (Non-Medical):   Physical Activity:   . Days of Exercise per Week:   . Minutes of Exercise per Session:   Stress:   . Feeling of Stress :   Social Connections:   . Frequency of Communication with Friends  and Family:   . Frequency of Social Gatherings with Friends and Family:   . Attends Religious Services:   . Active Member of Clubs or Organizations:   . Attends Archivist Meetings:   [name] Marital Status:   Intimate Partner Violence:   . Fear of Current or Ex-Partner:   . Emotionally Abused:   [name] Physically Abused:   . Sexually Abused:     Past Medical History, Surgical history, Social history, and Family history were reviewed and updated as appropriate.   Please see review of systems for further details on the patient's review from today.   Objective:   Physical Exam:  There were no vitals taken for this visit.  Physical Exam Constitutional:      General: She is not in acute distress. Musculoskeletal:        General: No deformity.  Neurological:     Mental Status: She is alert and oriented to person, place, and time.     Cranial Nerves: No dysarthria.     Coordination: Coordination normal.  Psychiatric:        Attention and Perception: Attention and perception normal. She does not perceive auditory or visual hallucinations.        Mood and Affect: Mood is anxious. Mood is not depressed. Affect is not labile, blunt, angry or inappropriate.        Speech: Speech normal.        Behavior: Behavior normal. Behavior is cooperative.        Thought Content: Thought content normal. Thought content is not paranoid or delusional. Thought content does not include homicidal or suicidal ideation. Thought content does not include homicidal or suicidal plan.        Cognition and Memory: Cognition and memory normal.        Judgment: Judgment normal.     Comments: Insight fair and chronically stressed. Fair judgment. Less manic dysphoria than usual.   Talkative chronically per usual.  Lab Review:     Component Value Date/Time   NA 140 03/23/2016 1933   K 4.4 03/23/2016 1933   CL 106 03/23/2016 1933   CO2 27 03/23/2016 1933   GLUCOSE 143 (H) 03/23/2016 1933   GLUCOSE 154 (H)  09/01/2006 1100   BUN 23 (H) 03/23/2016 1933   CREATININE 0.98 03/23/2016 1933   CREATININE 0.93 07/19/2011 1110   CALCIUM 9.8 03/23/2016 1933   PROT 6.8 01/21/2015 0140   ALBUMIN 3.8 01/21/2015 0140   AST 26 01/21/2015 0140   ALT 25 01/21/2015 0140   ALKPHOS 100 01/21/2015 0140   BILITOT 0.6 01/21/2015 0140   GFRNONAA >60 03/23/2016 1933   GFRAA >60 03/23/2016 1933       Component Value Date/Time   WBC 7.6 03/23/2016 1933   RBC 4.50 03/23/2016 1933   HGB 13.5 03/23/2016 1933   HCT 40.8 03/23/2016 1933   PLT 266 03/23/2016 1933   MCV 90.7 03/23/2016 1933   MCH 30.0 03/23/2016 1933   MCHC 33.1 03/23/2016 1933   RDW 13.6 03/23/2016 1933   LYMPHSABS 2.5 01/21/2015 0140   MONOABS 0.4 01/21/2015 0140   EOSABS 0.2 01/21/2015 0140   BASOSABS 0.1 01/21/2015 0140   Prior lamotrigine level in 2018  on this dosage was 3.6.  Not particularly high.  Per Dr. Marcos Eke: Clinical Impressions: Cognitive complaints are likely due to underlying psychiatric disorder (bipolar disorder/anxiety disorder with racing thoughts). Diagnostic impressions based on test performances are limited due to the patient's severe inability to fully attend to the tasks. However, based on clinical presentation, I highly doubt the presence of a neurodegenerative dementia. It is much more likely that her racing thoughts and psychiatric disorders are interfering with cognitive   No results found for: POCLITH, LITHIUM   No results found for: PHENYTOIN, PHENOBARB, VALPROATE, CBMZ   .res Assessment: Plan:    [name] disorder ([place])  Panic disorder with agoraphobia  Generalized anxiety disorder  Mild cognitive impairment  Insomnia due to mental condition   Thayer Headings has chronic severe anxiety with panic attacks as well as bipolar disorder with a history of significant lability.  Overall mood lability has been improved with the current med med regimen.  She remains chronically anxious.  She also has mild cognitive  impairment as a complicating factor.  She needs her husband's assistance to manage her medications.  At the visit in February 2020 Masako was more acutely confused recently for no clear reason.   She had a negative medical work-up for causes of short-term memory problems and confusion.  Therefore the decision was made to try switching her from alprazolam to lorazepam at the lowest possible dose in hopes of improving her cognition.  This was successful and her cognition was improved significantly and her husband verified this.    Mixed manic sx resolved for now. No med changes today. Can't reduce lorazepam any further but she's aware it might affect her memory.  Discussed potential metabolic side effects associated with atypical antipsychotics, as well as potential risk for movement side effects. Advised pt to contact office if movement side effects occur.   We discussed the short-term risks associated with benzodiazepines including sedation and increased fall risk among others.  Discussed long-term side effect risk including dependence, potential withdrawal symptoms, and the potential eventual dose-related risk of dementia.  Use of lorazepam remains medically necessary to manage her panic disorder.  Pt is chronically needy and requires extended appts DT chronic anxiety and easily stressed.  Supportive and cognitive work are done with the patient on her excessive guilt.  Also stressed  By D and GD and how to deal with GD's school refusal.  For example taking on responsibility for things with her granddaughter that she can fact not control.  Also addressing her fears related to her husband's diagnosis of cancer. Supportive therapy dealing with chronic stressors and difficulty with daughter.   Pt doesn't feel able to fix her meds by herself.  this appt was 35 mins.  FU 2-3 mos  [name], MD, DFAPA   Please see After Visit Summary for patient specific instructions.  Future Appointments   Date Time Provider [place]  04/07/2020  2:45 PM Trula Slade, DPM [facility]    No orders of the defined types were placed in this encounter.     -------------------------------

## 2020-03-01 ENCOUNTER — Other Ambulatory Visit: Payer: Self-pay | Admitting: Psychiatry

## 2020-03-02 NOTE — Telephone Encounter (Signed)
/  Next apt  with CC

## 2020-04-07 ENCOUNTER — Ambulatory Visit: Payer: Medicare Other | Admitting: Podiatry

## 2020-05-05 ENCOUNTER — Ambulatory Visit: Payer: Medicare Other | Admitting: Podiatry

## 2020-05-21 ENCOUNTER — Ambulatory Visit (INDEPENDENT_AMBULATORY_CARE_PROVIDER_SITE_OTHER): Payer: Medicare Other | Admitting: Psychiatry

## 2020-05-21 ENCOUNTER — Encounter: Payer: Self-pay | Admitting: Psychiatry

## 2020-05-21 ENCOUNTER — Other Ambulatory Visit: Payer: Self-pay

## 2020-05-21 DIAGNOSIS — F4001 Agoraphobia with panic disorder: Secondary | ICD-10-CM

## 2020-05-21 DIAGNOSIS — F411 Generalized anxiety disorder: Secondary | ICD-10-CM | POA: Diagnosis not present

## 2020-05-21 DIAGNOSIS — F3181 Bipolar II disorder: Secondary | ICD-10-CM | POA: Diagnosis not present

## 2020-05-21 DIAGNOSIS — F40240 Claustrophobia: Secondary | ICD-10-CM

## 2020-05-21 DIAGNOSIS — F5105 Insomnia due to other mental disorder: Secondary | ICD-10-CM

## 2020-05-21 DIAGNOSIS — G3184 Mild cognitive impairment, so stated: Secondary | ICD-10-CM | POA: Diagnosis not present

## 2020-05-21 NOTE — Progress Notes (Signed)
[name] [id number] [birth year] 69 y.o.    Subjective:   Patient ID:  [name] is a 69 y.o. (DOB [birth year]) female.  Chief Complaint:  Chief Complaint  Patient presents with  . Follow-up  . Anxiety  . Depression    Anxiety Symptoms include nervous/anxious behavior. Patient reports no chest pain, confusion, decreased concentration, dizziness or suicidal ideas.        Beckie Salts [name] presents to the office today for follow-up of anxiety and depression and poor cognition.  At her visit October 12, 2018.  Because of her poor cognition which did seem to get even worse lately with her memory and given a negative unremarkable medical work-up by her primary care doctor the decision was made to change her from Xanax which she has been on for years to lorazepam.  Lorazepam often has less cognitive side effects than does alprazolam.  She had a lot of difficulty with headaches and insomnia with the transition.  She called several times in that transition.  Her husband reported that her cognition was better after the transition however.  She was satisfied with the Ativan.  There was an attempt to reduce her lamotrigine to 150 mg also in hopes of improving cognition but it was uncertain at the last visit where there she had actually done so.  visit December 2020 without med changes.  She had continued to do cognitively better off alprazolam and on lorazepam.  seen March 2021.   Better than December visit.  Feeling good and less down.  Adjusting time of med helped excessively sleep.  Brief hypomanic episode resolved where she cleaned all night.  Sleep 10 hours . No med changes.  12/23/19 appt.  Noted: More depression, crying and racing thoughts and distressed to the point of wondering if needed the hospital.  Sleep got worse and needed Seroquel IR 200 also bc went 2 days without sleep.  It worked and helped sleep.  Worry a lot and melancholy this week.  Cries over what happens dealing  with her daugher.[name] has prostate and kidney cancer again.  Had surgery June 10, 2019 and she's cried a lot.  He's incontinent and very uncomfortable.  He usually has positive attitude.   Worry over his health and how she'll do if something happens to him.  She's afraid of Covid.  Trusting in God usually.  She realizes she  Needs to drive occasionally bc of his health.  [name]'s father had prostate CA and lived to 66 yo.  Uncle died of it.  Not markedly depressed. Can't walk much dT plantar fascitis. Also stressed over [name] 67 yo not doing school work and causing problems.  Her father [name] is a bad parent and not helpful.  Stressed over [name] and downs too and they have periodic conflict.   More down and anxious.   Guilt tendency.   Patient reports difficulty with sleep initiation or maintenance in part DT anxiety and racing thoughts.Intermittent sleep problems with days and nights mixed up.  Not napping. Not manic.[name] Denies appetite disturbance.  Patient reports that energy and motivation have been good.  Patient has some difficulty with concentration.  Chronic memory problems.   Patient denies any suicidal ideation. Plan:  No changes in meds indicated except agree to take the extra Seroquel 200 IR HS for mixed manic sx that are worse right now.  May be having mixed seasonal sx.  02/20/2020 appointment with the following noted: Doing fairly good except chronic  worry over D and Yolanda [name].  Some days cries a lot over it.  D having a hard time lately.  Still easily stressed but not more than usual. Stopped extra quetiapine 200 mg HS bc no longer needed it. Anxiety is chronic but at baseline as is depression and not manic now. H helps her take the meds.   Memory is still bad but has been worse when on Xanax instead fo Ativan.  Sleep is fine from 10 to 9 which is much better than in the past.  Occ spells of staying up for 2 days and then needs to take quetiapine.   H [name] goes for FU soon for cancer  check up. Plan: no med changes  05/21/20 appt with following noted: "A lot of problems".  Can't drive bc fear and anxiety.  Needed to drive H and couldn't. Fall off toilet twice. Dizzy and HA 2 days ago with a lot of crying and stress with daughter. Leaving home even coming here causes anxiety.    Cataract surgery twice. Fear of [name] being diagnosed with recurrent cancer. Chronic anxiety greather than depression.   Don't know what to do with daughter. Sleep, appetite and energy are OK. Tolerating meds. Sees [name] one day weekly. H helps with meds bc she's forgetful and easily confused.  Loses train of thoughts. Dog has separation anxiety and so they don't go out much.  Prior psychiatric medication trials include paroxetine, Lexapro,  risperidone 4 mg which was sedating, aripiprazole,  perphenazine, Seroquel 800, olanzapine for anxiety, and  lithium which was not tolerated even at very low dosages.  Topamax for anxiety, She was intolerant of both Namenda and Aricept.   Lamotrigine 300.   Xanax, Ativan.   This is not an exhaustive list.  Review of Systems:  Review of Systems  Eyes: Positive for visual disturbance.  Cardiovascular: Negative for chest pain.  Musculoskeletal: Positive for arthralgias, back pain and gait problem.  Neurological: Negative for dizziness, tremors and weakness.  Psychiatric/Behavioral: Negative for agitation, behavioral problems, confusion, decreased concentration, dysphoric mood, hallucinations, self-injury, sleep disturbance and suicidal ideas. The patient is nervous/anxious. The patient is not hyperactive.     Medications: I have reviewed the patient's current medications.  Current Outpatient Medications  Medication Sig Dispense Refill  . amLODipine (NORVASC) 5 MG tablet     . aspirin 81 MG tablet Take 81 mg by mouth daily.      . busPIRone (BUSPAR) 30 MG tablet Take 1 tablet (30 mg total) by mouth 2 (two) times daily. 180 tablet 3  . cefpodoxime  (VANTIN) 100 MG tablet Take 100 mg by mouth 2 (two) times daily.  0  . cephALEXin (KEFLEX) 250 MG capsule Take 250 mg by mouth at bedtime.    . diclofenac sodium (VOLTAREN) 1 % GEL Apply 2 g topically 4 (four) times daily. Rub into affected area of foot 2 to 4 times daily 100 g 2  . esomeprazole (NEXIUM) 40 MG capsule Take 1 capsule (40 mg total) by mouth daily. 30 capsule 0  . etodolac (LODINE) 400 MG tablet Take 400 mg by mouth 2 (two) times daily.    . furosemide (LASIX) 20 MG tablet Take 20 mg by mouth daily as needed (for severe swelling).     [name] lamoTRIgine (LAMICTAL) 150 MG tablet Take 1 tablet (150 mg total) by mouth 2 (two) times daily. 180 tablet 3  . lansoprazole (PREVACID) 15 MG capsule Take 15 mg by mouth daily.    [name]  levocetirizine (XYZAL) 5 MG tablet Take 5 mg by mouth every evening.    [name] levothyroxine (SYNTHROID) 88 MCG tablet TAKE 1 TABLET BY MOUTH EVERY MORNING ON AN EMPTY STOMACH    . lisinopril (PRINIVIL,ZESTRIL) 20 MG tablet Take 20 mg by mouth daily.    [name] LORazepam (ATIVAN) 0.5 MG tablet TAKE 1 TABLET BY MOUTH IN  THE MORNING AND 2 TABLETS  IN THE EVENING 270 tablet 0  . meloxicam (MOBIC) 15 MG tablet Take 15 mg by mouth daily.    . Methylfol-Methylcob-Acetylcyst (L-METHYL-MC NAC) 6-2-600 MG TABS TK 1 T PO QD    . metoprolol succinate (TOPROL-XL) 50 MG 24 hr tablet Take 50 mg by mouth daily. Take with or immediately following a meal.    . phenazopyridine (PYRIDIUM) 200 MG tablet Take 200 mg by mouth 3 (three) times daily as needed for pain.     . polyethylene glycol (MIRALAX / GLYCOLAX) packet Take 17 g by mouth 2 (two) times daily as needed for mild constipation.    . potassium chloride SA (K-DUR,KLOR-CON) 20 MEQ tablet Take 20 mEq by mouth daily.    . QUEtiapine (SEROQUEL XR) 400 MG 24 hr tablet Take 2 tablets (800 mg total) by mouth at bedtime. 180 tablet 3  . QUEtiapine (SEROQUEL) 200 MG tablet Take 1 tablet (200 mg total) by mouth at bedtime as needed (sleep). 90 tablet 1   . simvastatin (ZOCOR) 40 MG tablet Take 40 mg by mouth daily.     . Vitamin D, Ergocalciferol, (DRISDOL) 1.25 MG (50000 UNIT) CAPS capsule TAKE 1 CAPSULE (50,000  UNITS TOTAL) BY MOUTH TWICE WEEKLY 26 capsule 3   No current facility-administered medications for this visit.    Medication Side Effects: None  Allergies:  Allergies  Allergen Reactions  . Celexa [Citalopram Hydrobromide] Nausea Only  . Maxzide [Triamterene-Hctz]     Hurt patients kidneys  . Metformin And Related Nausea And Vomiting  . Other Nausea And Vomiting, Nausea Only and Other (See Comments)    Hurt patients kidneys  . Reglan [Metoclopramide] Nausea Only  . Risperdal [Risperidone] Nausea Only    Also causes hallucinations  . Trazodone And Nefazodone Nausea Only  . Zestril [Lisinopril] Nausea Only  . Lithium Palpitations and Other (See Comments)    Shakes and delusional pt started seeing things      Past Medical History:  Diagnosis Date  . Anxiety   . Arthritis   . Asthma   . Bipolar 1 disorder ([place])   . CHF (congestive heart failure) ([place])   . Complication of anesthesia    left a bad taste in my mouth it has lasted since january   . Depression   . Diabetes mellitus   . Hernia    umbilical  . Hyperlipidemia   . Hypertension   . Hypothyroidism   . Pericarditis, viral 2010 October  . Sleep apnea    uses cpap setting of 15    Family History  Problem Relation Age of Onset  . Diabetes Mother   . Hypertension Mother   . Hyperlipidemia Mother   . Diabetes Father   . Hypertension Father   . Hyperlipidemia Father   . Hypertension Sister     Social History   Socioeconomic History  . Marital status: Married    Spouse name: Not on file  . Number of children: Not on file  . Years of education: Not on file  . Highest education level: Not on file  Occupational History  .  Not on file  Tobacco Use  . Smoking status: Never Smoker  . Smokeless tobacco: Never Used  Substance and Sexual Activity   . Alcohol use: No  . Drug use: No  . Sexual activity: Not on file  Other Topics Concern  . Not on file  Social History Narrative  . Not on file   Social Determinants of Health   Financial Resource Strain:   . Difficulty of Paying Living Expenses: Not on file  Food Insecurity:   . Worried About Charity fundraiser in the Last Year: Not on file  . Ran Out of Food in the Last Year: Not on file  Transportation Needs:   . Lack of Transportation (Medical): Not on file  . Lack of Transportation (Non-Medical): Not on file  Physical Activity:   . Days of Exercise per Week: Not on file  . Minutes of Exercise per Session: Not on file  Stress:   . Feeling of Stress : Not on file  Social Connections:   . Frequency of Communication with Friends and Family: Not on file  . Frequency of Social Gatherings with Friends and Family: Not on file  . Attends Religious Services: Not on file  . Active Member of Clubs or Organizations: Not on file  . Attends Archivist Meetings: Not on file  . Marital Status: Not on file  Intimate Partner Violence:   . Fear of Current or Ex-Partner: Not on file  . Emotionally Abused: Not on file  . Physically Abused: Not on file  . Sexually Abused: Not on file    Past Medical History, Surgical history, Social history, and Family history were reviewed and updated as appropriate.   Please see review of systems for further details on the patient's review from today.   Objective:   Physical Exam:  There were no vitals taken for this visit.  Physical Exam Constitutional:      General: She is not in acute distress. Musculoskeletal:        General: No deformity.  Neurological:     Mental Status: She is alert and oriented to person, place, and time.     Cranial Nerves: No dysarthria.     Coordination: Coordination normal.  Psychiatric:        Attention and Perception: Attention and perception normal. She does not perceive auditory or visual  hallucinations.        Mood and Affect: Mood is anxious. Mood is not depressed. Affect is not labile, blunt, angry, tearful or inappropriate.        Speech: Speech normal.        Behavior: Behavior normal. Behavior is cooperative.        Thought Content: Thought content normal. Thought content is not paranoid or delusional. Thought content does not include homicidal or suicidal ideation. Thought content does not include homicidal or suicidal plan.        Cognition and Memory: Cognition and memory normal.        Judgment: Judgment normal.     Comments: Insight fair and chronically stressed. Fair judgment. Less manic dysphoria than usual.   Talkative chronically per usual.  Lab Review:     Component Value Date/Time   NA 140 03/23/2016 1933   K 4.4 03/23/2016 1933   CL 106 03/23/2016 1933   CO2 27 03/23/2016 1933   GLUCOSE 143 (H) 03/23/2016 1933   GLUCOSE 154 (H) 09/01/2006 1100   BUN 23 (H) 03/23/2016 1933   CREATININE 0.98  03/23/2016 1933   CREATININE 0.93 07/19/2011 1110   CALCIUM 9.8 03/23/2016 1933   PROT 6.8 01/21/2015 0140   ALBUMIN 3.8 01/21/2015 0140   AST 26 01/21/2015 0140   ALT 25 01/21/2015 0140   ALKPHOS 100 01/21/2015 0140   BILITOT 0.6 01/21/2015 0140   GFRNONAA >60 03/23/2016 1933   GFRAA >60 03/23/2016 1933       Component Value Date/Time   WBC 7.6 03/23/2016 1933   RBC 4.50 03/23/2016 1933   HGB 13.5 03/23/2016 1933   HCT 40.8 03/23/2016 1933   PLT 266 03/23/2016 1933   MCV 90.7 03/23/2016 1933   MCH 30.0 03/23/2016 1933   MCHC 33.1 03/23/2016 1933   RDW 13.6 03/23/2016 1933   LYMPHSABS 2.5 01/21/2015 0140   MONOABS 0.4 01/21/2015 0140   EOSABS 0.2 01/21/2015 0140   BASOSABS 0.1 01/21/2015 0140   Prior lamotrigine level in 2018 on this dosage was 3.6.  Not particularly high.  Per Dr. Marcos Eke: Clinical Impressions: Cognitive complaints are likely due to underlying psychiatric disorder (bipolar disorder/anxiety disorder with racing  thoughts). Diagnostic impressions based on test performances are limited due to the patient's severe inability to fully attend to the tasks. However, based on clinical presentation, I highly doubt the presence of a neurodegenerative dementia. It is much more likely that her racing thoughts and psychiatric disorders are interfering with cognitive   No results found for: POCLITH, LITHIUM   No results found for: PHENYTOIN, PHENOBARB, VALPROATE, CBMZ   .res Assessment: Plan:    [name] disorder ([place])  Panic disorder with agoraphobia  Generalized anxiety disorder  Mild cognitive impairment  Insomnia due to mental condition  Claustrophobia   Thayer Headings has chronic severe anxiety with panic attacks as well as bipolar disorder with a history of significant lability.  Overall mood lability has been improved with the current med med regimen.  She remains chronically anxious.  She also has mild cognitive impairment as a complicating factor.  She needs her husband's assistance to manage her medications.  At the visit in February 2020 Al[place]ria was more acutely confused recently for no clear reason.   She had a negative medical work-up for causes of short-term memory problems and confusion.  Therefore the decision was made to try switching her from alprazolam to lorazepam at the lowest possible dose in hopes of improving her cognition.  This was successful and her cognition was improved significantly and her husband verified this.    Mixed manic sx resolved for now. No med changes today. Continue lorazepam, lamotrigine, quetiapine, buspirone. Can't reduce lorazepam any further but she's aware it might affect her memory.  Discussed potential metabolic side effects associated with atypical antipsychotics, as well as potential risk for movement side effects. Advised pt to contact office if movement side effects occur.   We discussed the short-term risks associated with benzodiazepines including sedation  and increased fall risk among others.  Discussed long-term side effect risk including dependence, potential withdrawal symptoms, and the potential eventual dose-related risk of dementia.  Use of lorazepam remains medically necessary to manage her panic disorder.  Pt is chronically needy and requires extended appts DT chronic anxiety and easily stressed.  Supportive and cognitive work are done with the patient on her excessive guilt.  Also stressed  By D and Yolanda and how to deal with Yolanda's school refusal.  For example taking on responsibility for things with her granddaughter that she can fact not control.  Also addressing her fears related to  her husband's diagnosis of cancer. Supportive therapy dealing with chronic stressors and difficulty with daughter.   Pt doesn't feel able to fix her meds by herself.  No med changes  this appt was 35 mins.  FU 3-4 mos  [name], MD, DFAPA   Please see After Visit Summary for patient specific instructions.  No future appointments.  No orders of the defined types were placed in this encounter.     -------------------------------

## 2020-05-26 ENCOUNTER — Other Ambulatory Visit: Payer: Self-pay | Admitting: Physician Assistant

## 2020-05-26 ENCOUNTER — Other Ambulatory Visit: Payer: Self-pay | Admitting: Psychiatry

## 2020-05-27 NOTE — Telephone Encounter (Signed)
Due back in Dec

## 2020-06-12 ENCOUNTER — Emergency Department (HOSPITAL_COMMUNITY)
Admission: EM | Admit: 2020-06-12 | Discharge: 2020-06-12 | Disposition: A | Discharge status: Home / Self Care | Payer: Medicare Other | Attending: Emergency Medicine | Admitting: Emergency Medicine

## 2020-06-12 ENCOUNTER — Encounter (HOSPITAL_COMMUNITY): Payer: Self-pay | Admitting: *Deleted

## 2020-06-12 ENCOUNTER — Emergency Department (HOSPITAL_COMMUNITY): Payer: Medicare Other

## 2020-06-12 ENCOUNTER — Other Ambulatory Visit: Payer: Self-pay

## 2020-06-12 DIAGNOSIS — I509 Heart failure, unspecified: Secondary | ICD-10-CM | POA: Insufficient documentation

## 2020-06-12 DIAGNOSIS — E039 Hypothyroidism, unspecified: Secondary | ICD-10-CM | POA: Diagnosis not present

## 2020-06-12 DIAGNOSIS — Z7982 Long term (current) use of aspirin: Secondary | ICD-10-CM | POA: Diagnosis not present

## 2020-06-12 DIAGNOSIS — Z79899 Other long term (current) drug therapy: Secondary | ICD-10-CM | POA: Insufficient documentation

## 2020-06-12 DIAGNOSIS — E119 Type 2 diabetes mellitus without complications: Secondary | ICD-10-CM | POA: Diagnosis not present

## 2020-06-12 DIAGNOSIS — J45909 Unspecified asthma, uncomplicated: Secondary | ICD-10-CM | POA: Diagnosis not present

## 2020-06-12 DIAGNOSIS — I110 Hypertensive heart disease with heart failure: Secondary | ICD-10-CM | POA: Diagnosis not present

## 2020-06-12 DIAGNOSIS — I10 Essential (primary) hypertension: Secondary | ICD-10-CM

## 2020-06-12 DIAGNOSIS — R42 Dizziness and giddiness: Secondary | ICD-10-CM | POA: Diagnosis present

## 2020-06-12 LAB — BASIC METABOLIC PANEL
Anion gap: 12 (ref 5–15)
BUN: 13 mg/dL (ref 8–23)
CO2: 26 mmol/L (ref 22–32)
Calcium: 10.8 mg/dL — ABNORMAL HIGH (ref 8.9–10.3)
Chloride: 101 mmol/L (ref 98–111)
Creatinine, Ser: 0.8 mg/dL (ref 0.44–1.00)
GFR, Estimated: >60 mL/min (ref >60)
Glucose, Bld: 232 mg/dL — ABNORMAL HIGH (ref 70–99)
Potassium: 3.8 mmol/L (ref 3.5–5.1)
Sodium: 139 mmol/L (ref 135–145)

## 2020-06-12 LAB — CBC WITH DIFFERENTIAL/PLATELET
Abs Immature Granulocytes: 0.02 10*3/uL (ref 0.00–0.07)
Basophils Absolute: 0.1 10*3/uL (ref 0.0–0.1)
Basophils Relative: 1 %
Eosinophils Absolute: 0.1 10*3/uL (ref 0.0–0.5)
Eosinophils Relative: 2 %
HCT: 40.1 % (ref 36.0–46.0)
Hemoglobin: 13.7 g/dL (ref 12.0–15.0)
Immature Granulocytes: 0 %
Lymphocytes Relative: 21 %
Lymphs Abs: 1.1 10*3/uL (ref 0.7–4.0)
MCH: 31.4 pg (ref 26.0–34.0)
MCHC: 34.2 g/dL (ref 30.0–36.0)
MCV: 91.8 fL (ref 80.0–100.0)
Monocytes Absolute: 0.4 10*3/uL (ref 0.1–1.0)
Monocytes Relative: 7 %
Neutro Abs: 3.6 10*3/uL (ref 1.7–7.7)
Neutrophils Relative %: 69 %
Platelets: 184 10*3/uL (ref 150–400)
RBC: 4.37 MIL/uL (ref 3.87–5.11)
RDW: 13.7 % (ref 11.5–15.5)
WBC: 5.3 10*3/uL (ref 4.0–10.5)
nRBC: 0 % (ref 0.0–0.2)

## 2020-06-12 LAB — BRAIN NATRIURETIC PEPTIDE: B Natriuretic Peptide: 46 pg/mL (ref 0.0–100.0)

## 2020-06-12 MED ORDER — LISINOPRIL 20 MG PO TABS: 20.0000 mg | ORAL_TABLET | Freq: Once | ORAL | Status: DC

## 2020-06-12 MED ORDER — MECLIZINE HCL 25 MG PO TABS: 25.0000 mg | ORAL_TABLET | Freq: Three times a day (TID) | ORAL | 0 refills | Status: DC | PRN

## 2020-06-12 MED ORDER — AMLODIPINE BESYLATE 5 MG PO TABS
5.0000 mg | ORAL_TABLET | Freq: Once | ORAL | Status: AC
  Administered 2020-06-12: 5 mg via ORAL
  Filled 2020-06-12: qty 1

## 2020-06-12 NOTE — ED Triage Notes (Addendum)
Pt rambling in conversation, states she has had cataract surgery and her head feels dizzy when she lays down. Then states she has leg swelling and took pyridium to get the fluid off. She keeps saying she is so scared of Covid. Will not give specific reason for coming in,  Later in triage she says she is bipolar and it causes her blood sugar to go up.

## 2020-06-12 NOTE — Discharge Instructions (Signed)
The blood tests and x-rays were reassuring today.  Continue your current medications.  Take the meclizine to help with the dizziness follow-up with your doctor next week to be rechecked.  Return to the ED as needed for worsening symptoms

## 2020-06-12 NOTE — ED Provider Notes (Signed)
[name] DEPT Provider Note   CSN: [id number] Arrival date & time: 06/12/20  1052     History Chief Complaint  Patient presents with  . Leg Swelling  . Dizziness    [name] is a 69 y.o. female.  HPI   Pt states she is here for a lot of stuff.  Pt has been noticing a feeling like her eyes are spinning around.  Pt feels like her face is swollen.  She feels like she is gaining fluid.  She takes furosemide but only has to take it prn.  She did take it this am.  Patient is not having any chest pain or shortness of breath.  She is able to walk without difficulty.  No trouble with her speech.  No trouble with her vision.  Patient's doctor is not available so she came to the ED to get checked out  Past Medical History:  Diagnosis Date  . Anxiety   . Arthritis   . Asthma   . Bipolar 1 disorder ([place])   . CHF (congestive heart failure) ([place])   . Complication of anesthesia    left a bad taste in my mouth it has lasted since january   . Depression   . Diabetes mellitus   . Hernia    umbilical  . Hyperlipidemia   . Hypertension   . Hypothyroidism   . Pericarditis, viral 2010 October  . Sleep apnea    uses cpap setting of 15    Patient Active Problem List   Diagnosis Date Noted  . [name] disorder ([place]) 09/19/2018  . GAD (generalized anxiety disorder) 09/19/2018  . Panic 09/19/2018  . GERD (gastroesophageal reflux disease) 05/20/2014  . Abdominal pain, generalized 05/20/2014  . Nausea and vomiting 11/15/2012  . Diarrhea 11/15/2012  . OSA on CPAP 11/15/2012  . Bipolar 1 disorder, depressed ([place]) 11/15/2012  . Incisional hernia without mention of obstruction or gangrene 07/19/2011  . DYSPNEA 10/25/2010  . HYPERLIPIDEMIA-MIXED 10/22/2010  . OBESITY-MORBID (>100') 10/22/2010  . HYPERTENSION, UNSPECIFIED 10/22/2010  . Atrial fibrillation ([place]) 10/22/2010  . CHEST PAIN-UNSPECIFIED 10/22/2010    Past Surgical History:  Procedure  Laterality Date  . ABDOMINAL HYSTERECTOMY    . APPENDECTOMY    . CHOLECYSTECTOMY  1984   open  . COLONOSCOPY WITH PROPOFOL N/A 05/20/2014   Procedure: COLONOSCOPY WITH PROPOFOL;  Surgeon: Lear Ng, MD;  Location: [facility];  Service: Endoscopy;  Laterality: N/A;  . ESOPHAGOGASTRODUODENOSCOPY (EGD) WITH PROPOFOL N/A 05/20/2014   Procedure: ESOPHAGOGASTRODUODENOSCOPY (EGD) WITH PROPOFOL;  Surgeon: Lear Ng, MD;  Location: [facility];  Service: Endoscopy;  Laterality: N/A;  . FOOT SURGERY     bone spurs   . KNEE ARTHROSCOPY  09/07/2012   Procedure: ARTHROSCOPY KNEE;  Surgeon: Newt Minion, MD;  Location: [place];  Service: Orthopedics;  Laterality: Left;  Left Knee Arthroscopy     OB History    Gravida  2   Para  2   Term      Preterm      AB      Living  2     SAB      TAB      Ectopic      Multiple      Live Births              Family History  Problem Relation Age of Onset  . Diabetes Mother   . Hypertension Mother   . Hyperlipidemia  Mother   . Diabetes Father   . Hypertension Father   . Hyperlipidemia Father   . Hypertension Sister     Social History   Tobacco Use  . Smoking status: Never Smoker  . Smokeless tobacco: Never Used  Substance Use Topics  . Alcohol use: No  . Drug use: No    Home Medications Prior to Admission medications   Medication Sig Start Date End Date Taking? Authorizing Provider  amLODipine (NORVASC) 5 MG tablet  01/28/19   [provider]  aspirin 81 MG tablet Take 81 mg by mouth daily.      [provider]  busPIRone (BUSPAR) 30 MG tablet Take 1 tablet (30 mg total) by mouth 2 (two) times daily. 09/24/19   Cottle, Billey Co., MD  cefpodoxime (VANTIN) 100 MG tablet Take 100 mg by mouth 2 (two) times daily. 06/21/18   [provider]  cephALEXin (KEFLEX) 250 MG capsule Take 250 mg by mouth at bedtime. 09/28/19   [provider]  diclofenac sodium (VOLTAREN) 1 % GEL Apply  2 g topically 4 (four) times daily. Rub into affected area of foot 2 to 4 times daily 06/03/19   Trula Slade, DPM  esomeprazole (NEXIUM) 40 MG capsule Take 1 capsule (40 mg total) by mouth daily. 02/16/14   Elyn Peers, MD  etodolac (LODINE) 400 MG tablet Take 400 mg by mouth 2 (two) times daily. 12/01/13   [provider]  furosemide (LASIX) 20 MG tablet Take 20 mg by mouth daily as needed (for severe swelling).     [provider]  lamoTRIgine (LAMICTAL) 150 MG tablet Take 1 tablet (150 mg total) by mouth 2 (two) times daily. 09/24/19   Cottle, Billey Co., MD  lansoprazole (PREVACID) 15 MG capsule Take 15 mg by mouth daily. 08/27/19   [provider]  levocetirizine (XYZAL) 5 MG tablet Take 5 mg by mouth every evening.    [provider]  levothyroxine (SYNTHROID) 88 MCG tablet TAKE 1 TABLET BY MOUTH EVERY MORNING ON AN EMPTY STOMACH 01/16/19   [provider]  lisinopril (PRINIVIL,ZESTRIL) 20 MG tablet Take 20 mg by mouth daily.    [provider]  LORazepam (ATIVAN) 0.5 MG tablet TAKE 1 TABLET BY MOUTH IN  THE MORNING AND 2 TABLETS  IN THE EVENING 05/27/20   Cottle, Billey Co., MD  meclizine (ANTIVERT) 25 MG tablet Take 1 tablet (25 mg total) by mouth 3 (three) times daily as needed for dizziness. 06/12/20   Dorie Rank, MD  meloxicam (MOBIC) 15 MG tablet Take 15 mg by mouth daily. 08/27/19   [provider]  Methylfol-Methylcob-Acetylcyst (L-METHYL-MC NAC) 6-2-600 MG TABS TK 1 T PO QD 02/12/18   [provider]  metoprolol succinate (TOPROL-XL) 50 MG 24 hr tablet Take 50 mg by mouth daily. Take with or immediately following a meal.    [provider]  phenazopyridine (PYRIDIUM) 200 MG tablet Take 200 mg by mouth 3 (three) times daily as needed for pain.  12/10/15   [provider]  polyethylene glycol (MIRALAX / GLYCOLAX) packet Take 17 g by mouth 2 (two) times daily as needed for mild constipation.     [provider]  potassium chloride SA (K-DUR,KLOR-CON) 20 MEQ tablet Take 20 mEq by mouth daily.    [provider]  QUEtiapine (SEROQUEL XR) 400 MG 24 hr tablet Take 2 tablets (800 mg total) by mouth at bedtime. 01/02/20   Reatha Armour  Brooke Bonito., MD  QUEtiapine (SEROQUEL) 200 MG tablet TAKE 1 TABLET BY MOUTH AT  BEDTIME AS NEEDED FOR SLEEP 05/27/20   Cottle, Billey Co., MD  simvastatin (ZOCOR) 40 MG tablet Take 40 mg by mouth daily.     [provider]  Vitamin D, Ergocalciferol, (DRISDOL) 1.25 MG (50000 UNIT) CAPS capsule TAKE 1 CAPSULE (50,000  UNITS TOTAL) BY MOUTH TWICE WEEKLY 02/11/20   Cottle, Billey Co., MD    Allergies    Celexa [citalopram hydrobromide], Maxzide [triamterene-hctz], Metformin and related, Other, Reglan [metoclopramide], Risperdal [risperidone], Trazodone and nefazodone, Zestril [lisinopril], and Lithium  Review of Systems   Review of Systems  All other systems reviewed and are negative.   Physical Exam Updated Vital Signs BP (!) 182/94   Pulse 89   Temp 97.8 F (36.6 C) (Oral)   Resp 17   Ht 1.651 m (5\' 5" )   Wt 124.7 kg   SpO2 99%   BMI 45.76 kg/m   Physical Exam Vitals and nursing note reviewed.  Constitutional:      General: She is not in acute distress.    Appearance: She is well-developed.  HENT:     Head: Normocephalic and atraumatic.     Comments: No edema noted     Right Ear: External ear normal.     Left Ear: External ear normal.  Eyes:     General: No scleral icterus.       Right eye: No discharge.        Left eye: No discharge.     Conjunctiva/sclera: Conjunctivae normal.  Neck:     Trachea: No tracheal deviation.  Cardiovascular:     Rate and Rhythm: Normal rate and regular rhythm.  Pulmonary:     Effort: Pulmonary effort is normal. No respiratory distress.     Breath sounds: Normal breath sounds. No stridor. No wheezing or rales.  Abdominal:     General: Bowel sounds are normal. There is no distension.      Palpations: Abdomen is soft.     Tenderness: There is no abdominal tenderness. There is no guarding or rebound.  Musculoskeletal:        General: No tenderness.     Cervical back: Neck supple.     Right lower leg: No edema.     Left lower leg: No edema.  Skin:    General: Skin is warm and dry.     Findings: No rash.  Neurological:     Mental Status: She is alert and oriented to person, place, and time.     Cranial Nerves: No cranial nerve deficit (no facial droop, extraocular movements intact, no slurred speech).     Sensory: No sensory deficit.     Motor: No abnormal muscle tone or seizure activity.     Coordination: Coordination normal.     Comments: No pronator drift bilateral upper extrem, able to hold both legs off bed for 5 seconds, sensation intact in all extremities, no visual field cuts, no left or right sided neglect, normal finger-nose exam bilaterally, no nystagmus noted  Nl gait     ED Results / Procedures / Treatments   Labs (all labs ordered are listed, but only abnormal results are displayed) Labs Reviewed  BASIC METABOLIC PANEL - Abnormal; Notable for the following components:      Result Value   Glucose, Bld 232 (*)    Calcium 10.8 (*)    All other components within normal limits  CBC WITH DIFFERENTIAL/PLATELET  BRAIN NATRIURETIC PEPTIDE    EKG EKG Interpretation  Date/Time:  Friday June 12 2020 12:16:09 EDT Ventricular Rate:  101 PR Interval:    QRS Duration: 97 QT Interval:  352 QTC Calculation: 457 R Axis:   -130 Text Interpretation: Sinus tachycardia Consider left atrial enlargement Anterior infarct, old No significant change since last tracing Confirmed by Dorie Rank [phone]) on 06/12/2020 12:27:57 PM   Radiology CT Head Wo Contrast  Result Date: 06/12/2020 CLINICAL DATA:  Neuro deficit, acute stroke suspected.  Dizziness. EXAM: CT HEAD WITHOUT CONTRAST TECHNIQUE: Contiguous axial images were obtained from the base of the skull through  the vertex without intravenous contrast. COMPARISON:  CT head 10/08/2018 FINDINGS: Brain: No evidence of acute large vascular territory infarction, hemorrhage, hydrocephalus, extra-axial collection or mass lesion/mass effect. Vascular: Calcific atherosclerosis. Skull: No acute fracture.  Hyperostosis frontalis. Sinuses/Orbits: No substantial paranasal sinus disease. Unremarkable orbits. Other: No mastoid effusions. IMPRESSION: No evidence of acute intracranial abnormality. Electronically Signed   By: Margaretha Sheffield MD   On: 06/12/2020 14:05   DG Chest Portable 1 View  Result Date: 06/12/2020 CLINICAL DATA:  Dyspnea, swelling. EXAM: PORTABLE CHEST 1 VIEW COMPARISON:  November 15, 2012 FINDINGS: Enlarged cardiac silhouette. No evidence of focal consolidation, although patient body habitus limits evaluation. No visible pleural effusions or pneumothorax. No acute osseous abnormality. IMPRESSION: 1. No evidence of acute cardiopulmonary disease, although patient body habitus limits evaluation. Dedicated PA and lateral radiographs could better evaluate if clinically indicated. 2. Cardiomegaly. Electronically Signed   By: Margaretha Sheffield MD   On: 06/12/2020 12:15    Procedures Procedures (including critical care time)  Medications Ordered in ED Medications - No data to display  ED Course  I have reviewed the triage vital signs and the nursing notes.  Pertinent labs & imaging results that were available during my care of the patient were reviewed by me and considered in my medical decision making (see chart for details).  Clinical Course as of Jun 12 1504  Fri Jun 12, 2020  1436 Patient's laboratory tests are reassuring.  CBC is normal.  BNP is normal.  Metabolic panel shows hyperglycemia but no acidosis   [JK]    Clinical Course User Index [JK] Dorie Rank, MD   MDM Rules/Calculators/A&P                          Patient presented to the ED for evaluation of of dizziness and concerned about  swelling.  Patient on exam did not have any evidence of focal neurologic deficits.  She does not have any edema on exam to suggest CHF exacerbation.  Patient was able to walk without difficulty.  ED work-up is reassuring.  Head CT without acute findings.  Laboratory tests do not show signs to suggest CHF exacerbation.  Patient does describe some dizziness with head movement.  She may have a case of vertigo.  Patient was noted to be hypertensive.  She has not taken her home medications.  We will give her a dose of Norvasc Final Clinical Impression(s) / ED Diagnoses Final diagnoses:  Dizziness  Vertigo    Rx / DC Orders ED Discharge Orders         Ordered    meclizine (ANTIVERT) 25 MG tablet  3 times daily PRN        06/12/20 1459           Dorie Rank, MD 06/12/20 1507

## 2020-06-18 ENCOUNTER — Telehealth: Payer: Self-pay | Admitting: Psychiatry

## 2020-06-18 NOTE — Telephone Encounter (Signed)
Please review

## 2020-06-18 NOTE — Telephone Encounter (Signed)
Pt called to report she had to go to emergency room for vertigo and doctor gave her Meclizine for dizziness. Pt needs to know if she can take with other meds she take? Contact # [phone]

## 2020-06-19 NOTE — Telephone Encounter (Signed)
Patient's husband aware °

## 2020-06-19 NOTE — Telephone Encounter (Signed)
Please let her know that she can take the meclizine as prescribed by her other doctor without fear of any interaction.

## 2020-07-27 ENCOUNTER — Other Ambulatory Visit: Payer: Self-pay | Admitting: Psychiatry

## 2020-07-30 ENCOUNTER — Other Ambulatory Visit: Payer: Self-pay

## 2020-07-30 ENCOUNTER — Emergency Department (HOSPITAL_COMMUNITY)
Admission: EM | Admit: 2020-07-30 | Discharge: 2020-07-30 | Disposition: A | Discharge status: Home / Self Care | Payer: Medicare Other | Attending: Emergency Medicine | Admitting: Emergency Medicine

## 2020-07-30 ENCOUNTER — Telehealth: Payer: Self-pay | Admitting: Psychiatry

## 2020-07-30 ENCOUNTER — Emergency Department (HOSPITAL_COMMUNITY): Payer: Medicare Other

## 2020-07-30 ENCOUNTER — Encounter (HOSPITAL_COMMUNITY): Payer: Self-pay

## 2020-07-30 DIAGNOSIS — E039 Hypothyroidism, unspecified: Secondary | ICD-10-CM | POA: Insufficient documentation

## 2020-07-30 DIAGNOSIS — Z79899 Other long term (current) drug therapy: Secondary | ICD-10-CM | POA: Insufficient documentation

## 2020-07-30 DIAGNOSIS — I110 Hypertensive heart disease with heart failure: Secondary | ICD-10-CM | POA: Diagnosis not present

## 2020-07-30 DIAGNOSIS — E119 Type 2 diabetes mellitus without complications: Secondary | ICD-10-CM | POA: Insufficient documentation

## 2020-07-30 DIAGNOSIS — Z7982 Long term (current) use of aspirin: Secondary | ICD-10-CM | POA: Insufficient documentation

## 2020-07-30 DIAGNOSIS — I509 Heart failure, unspecified: Secondary | ICD-10-CM | POA: Insufficient documentation

## 2020-07-30 DIAGNOSIS — R42 Dizziness and giddiness: Secondary | ICD-10-CM | POA: Diagnosis present

## 2020-07-30 DIAGNOSIS — J45909 Unspecified asthma, uncomplicated: Secondary | ICD-10-CM | POA: Insufficient documentation

## 2020-07-30 LAB — COMPREHENSIVE METABOLIC PANEL
ALT: 23 U/L (ref 0–44)
AST: 30 U/L (ref 15–41)
Albumin: 3.7 g/dL (ref 3.5–5.0)
Alkaline Phosphatase: 99 U/L (ref 38–126)
Anion gap: 8 (ref 5–15)
BUN: 13 mg/dL (ref 8–23)
CO2: 25 mmol/L (ref 22–32)
Calcium: 10.2 mg/dL (ref 8.9–10.3)
Chloride: 107 mmol/L (ref 98–111)
Creatinine, Ser: 0.78 mg/dL (ref 0.44–1.00)
GFR, Estimated: >60 mL/min (ref >60)
Glucose, Bld: 215 mg/dL — ABNORMAL HIGH (ref 70–99)
Potassium: 4 mmol/L (ref 3.5–5.1)
Sodium: 140 mmol/L (ref 135–145)
Total Bilirubin: 0.8 mg/dL (ref 0.3–1.2)
Total Protein: 6.8 g/dL (ref 6.5–8.1)

## 2020-07-30 LAB — DIFFERENTIAL
Abs Immature Granulocytes: 0.02 10*3/uL (ref 0.00–0.07)
Basophils Absolute: 0.1 10*3/uL (ref 0.0–0.1)
Basophils Relative: 1 %
Eosinophils Absolute: 0.2 10*3/uL (ref 0.0–0.5)
Eosinophils Relative: 3 %
Immature Granulocytes: 0 %
Lymphocytes Relative: 18 %
Lymphs Abs: 1.1 10*3/uL (ref 0.7–4.0)
Monocytes Absolute: 0.4 10*3/uL (ref 0.1–1.0)
Monocytes Relative: 6 %
Neutro Abs: 4.5 10*3/uL (ref 1.7–7.7)
Neutrophils Relative %: 72 %

## 2020-07-30 LAB — ETHANOL: Alcohol, Ethyl (B): <10 mg/dL (ref <10)

## 2020-07-30 LAB — RAPID URINE DRUG SCREEN, HOSP PERFORMED
Amphetamines: NOT DETECTED
Barbiturates: NOT DETECTED
Benzodiazepines: POSITIVE — AB
Cocaine: NOT DETECTED
Opiates: NOT DETECTED
Tetrahydrocannabinol: NOT DETECTED

## 2020-07-30 LAB — CBC
HCT: 44.5 % (ref 36.0–46.0)
Hemoglobin: 13.9 g/dL (ref 12.0–15.0)
MCH: 31.2 pg (ref 26.0–34.0)
MCHC: 31.2 g/dL (ref 30.0–36.0)
MCV: 100 fL (ref 80.0–100.0)
Platelets: 174 10*3/uL (ref 150–400)
RBC: 4.45 MIL/uL (ref 3.87–5.11)
RDW: 13.3 % (ref 11.5–15.5)
WBC: 6.3 10*3/uL (ref 4.0–10.5)
nRBC: 0 % (ref 0.0–0.2)

## 2020-07-30 LAB — APTT: aPTT: 30 seconds (ref 24–36)

## 2020-07-30 LAB — PROTIME-INR
INR: 1.1 (ref 0.8–1.2)
Prothrombin Time: 14 seconds (ref 11.4–15.2)

## 2020-07-30 LAB — URINALYSIS, ROUTINE W REFLEX MICROSCOPIC
Bilirubin Urine: NEGATIVE
Glucose, UA: NEGATIVE mg/dL
Hgb urine dipstick: NEGATIVE
Ketones, ur: NEGATIVE mg/dL
Leukocytes,Ua: NEGATIVE
Nitrite: NEGATIVE
Protein, ur: NEGATIVE mg/dL
Specific Gravity, Urine: 1.017 (ref 1.005–1.030)
pH: 5 (ref 5.0–8.0)

## 2020-07-30 MED ORDER — PROCHLORPERAZINE EDISYLATE 10 MG/2ML IJ SOLN
5.0000 mg | Freq: Once | INTRAMUSCULAR | Status: AC
  Administered 2020-07-30: 5 mg via INTRAVENOUS
  Filled 2020-07-30: qty 2

## 2020-07-30 MED ORDER — MECLIZINE HCL 25 MG PO TABS
25.0000 mg | ORAL_TABLET | Freq: Once | ORAL | Status: AC
  Administered 2020-07-30: 25 mg via ORAL
  Filled 2020-07-30: qty 1

## 2020-07-30 MED ORDER — DIAZEPAM 5 MG PO TABS: 5.0000 mg | ORAL_TABLET | Freq: Two times a day (BID) | ORAL | 0 refills | Status: DC | PRN

## 2020-07-30 MED ORDER — DIAZEPAM 5 MG/ML IJ SOLN
5.0000 mg | Freq: Once | INTRAMUSCULAR | Status: AC
  Administered 2020-07-30: 5 mg via INTRAVENOUS
  Filled 2020-07-30: qty 2

## 2020-07-30 MED ORDER — SODIUM CHLORIDE 0.9 % IV BOLUS
1000.0000 mL | Freq: Once | INTRAVENOUS | Status: AC
  Administered 2020-07-30: 1000 mL via INTRAVENOUS

## 2020-07-30 MED ORDER — DIPHENHYDRAMINE HCL 50 MG/ML IJ SOLN
12.5000 mg | Freq: Once | INTRAMUSCULAR | Status: AC
  Administered 2020-07-30: 12.5 mg via INTRAVENOUS
  Filled 2020-07-30: qty 1

## 2020-07-30 NOTE — ED Notes (Signed)
Pt discharged from this ED in stable condition at this time. All discharge instructions and follow up care reviewed with pt with no further questions at this time. Pt ambulatory to baseline, clear speech.   

## 2020-07-30 NOTE — ED Notes (Signed)
With NT assistance, wheeled pt to restroom. Pt able to transfer to/from wheelchair and toilet with 2-person standby. Pt experienced mild vertigo when moving quickly onto bed, relieved with rest.

## 2020-07-30 NOTE — ED Notes (Signed)
Pt declined ambulating in room due to dizziness and shaking. MD present. New medication orders.

## 2020-07-30 NOTE — ED Notes (Signed)
Pt transported to CT ?

## 2020-07-30 NOTE — ED Provider Notes (Signed)
Pt signed out by Dr. Tyrone Nine pending symptomatic improvement.  Pt had a CT head:    IMPRESSION:  No acute intracranial findings or mass lesions.   She is feeling better after valium and fluids.  She is able to transfer from the wheelchair to the toilet.  She said she feels much better.  Pt told to do her vestibular exercises and is given a print out of the Epley maneuver exercises to do at home.  Return if worse.  F/u with pcp.  Amb referral given for neurology.   Isla Pence, MD 07/30/20 [phone]

## 2020-07-30 NOTE — ED Triage Notes (Signed)
Pt arrives EMS with c/o vertigo x 2 days. Pt has hx of same. 4mg  zofran given enroute.

## 2020-07-30 NOTE — ED Provider Notes (Signed)
[name] DEPT Provider Note   CSN: [id number] Arrival date & time: 07/30/20  0229     History Chief Complaint  Patient presents with  . Dizziness    [name] is a 69 y.o. female.  69 yo F with a chief complaint of dizziness.  This feels like the room is spinning and occurs mostly when she turns her head to the right.  This is now the first time the patient's had this problem.  Has been off and on for some time.  She was having some trouble getting around the house and so ended up calling 911.  Denies recent head injury or headache denies neck pain.  Denies ringing in her ears.  Has been having some pain to the left lower extremity but denies weakness or numbness to the extremities.  She denies cough congestion or fever.  Denies abdominal pain.  Denies recent medication change.  Has been feeling very thirsty recently.  The history is provided by the patient.  Dizziness Quality:  Vertigo Severity:  Moderate Onset quality:  Gradual Timing:  Constant Progression:  Worsening Chronicity:  New Relieved by:  Nothing Worsened by:  Nothing Ineffective treatments:  None tried Associated symptoms: no chest pain, no headaches, no nausea, no palpitations, no shortness of breath and no vomiting        Past Medical History:  Diagnosis Date  . Anxiety   . Arthritis   . Asthma   . Bipolar 1 disorder ([place])   . CHF (congestive heart failure) ([place])   . Complication of anesthesia    left a bad taste in my mouth it has lasted since january   . Depression   . Diabetes mellitus   . Hernia    umbilical  . Hyperlipidemia   . Hypertension   . Hypothyroidism   . Pericarditis, viral 2010 October  . Sleep apnea    uses cpap setting of 15    Patient Active Problem List   Diagnosis Date Noted  . [name] disorder ([place]) 09/19/2018  . GAD (generalized anxiety disorder) 09/19/2018  . Panic 09/19/2018  . GERD (gastroesophageal reflux disease)  05/20/2014  . Abdominal pain, generalized 05/20/2014  . Nausea and vomiting 11/15/2012  . Diarrhea 11/15/2012  . OSA on CPAP 11/15/2012  . Bipolar 1 disorder, depressed ([place]) 11/15/2012  . Incisional hernia without mention of obstruction or gangrene 07/19/2011  . DYSPNEA 10/25/2010  . HYPERLIPIDEMIA-MIXED 10/22/2010  . OBESITY-MORBID (>100') 10/22/2010  . HYPERTENSION, UNSPECIFIED 10/22/2010  . Atrial fibrillation ([place]) 10/22/2010  . CHEST PAIN-UNSPECIFIED 10/22/2010    Past Surgical History:  Procedure Laterality Date  . ABDOMINAL HYSTERECTOMY    . APPENDECTOMY    . CHOLECYSTECTOMY  1984   open  . COLONOSCOPY WITH PROPOFOL N/A 05/20/2014   Procedure: COLONOSCOPY WITH PROPOFOL;  Surgeon: Lear Ng, MD;  Location: [facility];  Service: Endoscopy;  Laterality: N/A;  . ESOPHAGOGASTRODUODENOSCOPY (EGD) WITH PROPOFOL N/A 05/20/2014   Procedure: ESOPHAGOGASTRODUODENOSCOPY (EGD) WITH PROPOFOL;  Surgeon: Lear Ng, MD;  Location: [facility];  Service: Endoscopy;  Laterality: N/A;  . FOOT SURGERY     bone spurs   . KNEE ARTHROSCOPY  09/07/2012   Procedure: ARTHROSCOPY KNEE;  Surgeon: Newt Minion, MD;  Location: [facility];  Service: Orthopedics;  Laterality: Left;  Left Knee Arthroscopy     OB History    Gravida  2   Para  2   Term      Preterm  AB      Living  2     SAB      IAB      Ectopic      Multiple      Live Births              Family History  Problem Relation Age of Onset  . Diabetes Mother   . Hypertension Mother   . Hyperlipidemia Mother   . Diabetes Father   . Hypertension Father   . Hyperlipidemia Father   . Hypertension Sister     Social History   Tobacco Use  . Smoking status: Never Smoker  . Smokeless tobacco: Never Used  Substance Use Topics  . Alcohol use: No  . Drug use: No    Home Medications Prior to Admission medications   Medication Sig Start Date End Date Taking? Authorizing Provider  amLODipine  (NORVASC) 5 MG tablet  01/28/19   [provider]  aspirin 81 MG tablet Take 81 mg by mouth daily.      [provider]  busPIRone (BUSPAR) 30 MG tablet Take 1 tablet (30 mg total) by mouth 2 (two) times daily. 09/24/19   Cottle, Billey Co., MD  cefpodoxime (VANTIN) 100 MG tablet Take 100 mg by mouth 2 (two) times daily. 06/21/18   [provider]  cephALEXin (KEFLEX) 250 MG capsule Take 250 mg by mouth at bedtime. 09/28/19   [provider]  diclofenac sodium (VOLTAREN) 1 % GEL Apply 2 g topically 4 (four) times daily. Rub into affected area of foot 2 to 4 times daily 06/03/19   Trula Slade, DPM  esomeprazole (NEXIUM) 40 MG capsule Take 1 capsule (40 mg total) by mouth daily. 02/16/14   Elyn Peers, MD  etodolac (LODINE) 400 MG tablet Take 400 mg by mouth 2 (two) times daily. 12/01/13   [provider]  furosemide (LASIX) 20 MG tablet Take 20 mg by mouth daily as needed (for severe swelling).     [provider]  lamoTRIgine (LAMICTAL) 150 MG tablet TAKE 1 TABLET BY MOUTH  TWICE DAILY 07/28/20   Cottle, Billey Co., MD  lansoprazole (PREVACID) 15 MG capsule Take 15 mg by mouth daily. 08/27/19   [provider]  levocetirizine (XYZAL) 5 MG tablet Take 5 mg by mouth every evening.    [provider]  levothyroxine (SYNTHROID) 88 MCG tablet TAKE 1 TABLET BY MOUTH EVERY MORNING ON AN EMPTY STOMACH 01/16/19   [provider]  lisinopril (PRINIVIL,ZESTRIL) 20 MG tablet Take 20 mg by mouth daily.    [provider]  LORazepam (ATIVAN) 0.5 MG tablet TAKE 1 TABLET BY MOUTH IN  THE MORNING AND 2 TABLETS  IN THE EVENING 05/27/20   Cottle, Billey Co., MD  meclizine (ANTIVERT) 25 MG tablet Take 1 tablet (25 mg total) by mouth 3 (three) times daily as needed for dizziness. 06/12/20   Dorie Rank, MD  meloxicam (MOBIC) 15 MG tablet Take 15 mg by mouth daily. 08/27/19   [provider]  Methylfol-Methylcob-Acetylcyst  (L-METHYL-MC NAC) 6-2-600 MG TABS TK 1 T PO QD 02/12/18   [provider]  metoprolol succinate (TOPROL-XL) 50 MG 24 hr tablet Take 50 mg by mouth daily. Take with or immediately following a meal.    [provider]  phenazopyridine (PYRIDIUM) 200 MG tablet Take 200 mg by mouth 3 (three) times daily as needed for pain.  12/10/15   [provider]  polyethylene glycol (  MIRALAX / GLYCOLAX) packet Take 17 g by mouth 2 (two) times daily as needed for mild constipation.    [provider]  potassium chloride SA (K-DUR,KLOR-CON) 20 MEQ tablet Take 20 mEq by mouth daily.    [provider]  QUEtiapine (SEROQUEL XR) 400 MG 24 hr tablet Take 2 tablets (800 mg total) by mouth at bedtime. 01/02/20   Cottle, Billey Co., MD  QUEtiapine (SEROQUEL) 200 MG tablet TAKE 1 TABLET BY MOUTH AT  BEDTIME AS NEEDED FOR SLEEP 05/27/20   Cottle, Billey Co., MD  simvastatin (ZOCOR) 40 MG tablet Take 40 mg by mouth daily.     [provider]  Vitamin D, Ergocalciferol, (DRISDOL) 1.25 MG (50000 UNIT) CAPS capsule TAKE 1 CAPSULE (50,000  UNITS TOTAL) BY MOUTH TWICE WEEKLY 02/11/20   Cottle, Billey Co., MD    Allergies    Celexa [citalopram hydrobromide], Maxzide [triamterene-hctz], Metformin and related, Other, Reglan [metoclopramide], Risperdal [risperidone], Trazodone and nefazodone, Zestril [lisinopril], and Lithium  Review of Systems   Review of Systems  Constitutional: Negative for chills and fever.  HENT: Negative for congestion and rhinorrhea.   Eyes: Negative for redness and visual disturbance.  Respiratory: Negative for shortness of breath and wheezing.   Cardiovascular: Negative for chest pain and palpitations.  Gastrointestinal: Negative for nausea and vomiting.  Endocrine: Positive for polydipsia.  Genitourinary: Negative for dysuria and urgency.  Musculoskeletal: Negative for arthralgias and myalgias.  Skin: Negative for pallor and wound.  Neurological:  Positive for dizziness. Negative for headaches.    Physical Exam Updated Vital Signs BP (!) 164/103   Pulse 76   Temp (!) 97.3 F (36.3 C) (Oral)   Resp 16   Ht 5\' 5"  (1.651 m)   Wt 124.7 kg   SpO2 100%   BMI 45.76 kg/m   Physical Exam Vitals and nursing note reviewed.  Constitutional:      General: She is not in acute distress.    Appearance: She is well-developed and well-nourished. She is obese. She is not diaphoretic.     Comments: Appears dry, dry and tachy mucous membranes  HENT:     Head: Normocephalic and atraumatic.  Eyes:     Extraocular Movements: EOM normal.     Pupils: Pupils are equal, round, and reactive to light.  Cardiovascular:     Rate and Rhythm: Normal rate and regular rhythm.     Heart sounds: No murmur heard. No friction rub. No gallop.   Pulmonary:     Effort: Pulmonary effort is normal.     Breath sounds: No wheezing or rales.  Abdominal:     General: There is no distension.     Palpations: Abdomen is soft.     Tenderness: There is no abdominal tenderness.  Musculoskeletal:        General: No tenderness or edema.     Cervical back: Normal range of motion and neck supple.  Skin:    General: Skin is warm and dry.  Neurological:     Mental Status: She is alert and oriented to person, place, and time.     Cranial Nerves: Cranial nerves are intact.     Sensory: Sensation is intact.     Motor: Motor function is intact.     Comments: Difficulty with finger to nose bilaterally.  ? Visual disturbance.  Otherwise benign exam.   Psychiatric:        Mood and Affect: Mood and affect normal.  Behavior: Behavior normal.     ED Results / Procedures / Treatments   Labs (all labs ordered are listed, but only abnormal results are displayed) Labs Reviewed  COMPREHENSIVE METABOLIC PANEL - Abnormal; Notable for the following components:      Result Value   Glucose, Bld 215 (*)    All other components within normal limits  ETHANOL  PROTIME-INR   APTT  CBC  DIFFERENTIAL  RAPID URINE DRUG SCREEN, HOSP PERFORMED  URINALYSIS, ROUTINE W REFLEX MICROSCOPIC    EKG EKG Interpretation  Date/Time:  Thursday July 30 2020 04:41:26 EST Ventricular Rate:  75 PR Interval:    QRS Duration: 96 QT Interval:  405 QTC Calculation: 453 R Axis:   79 Text Interpretation: Sinus rhythm Low voltage, precordial leads No significant change since last tracing Confirmed by Deno Etienne [phone]) on 07/30/2020 4:57:58 AM   Radiology No results found.  Procedures Procedures (including critical care time)  Medications Ordered in ED Medications  meclizine (ANTIVERT) tablet 25 mg (25 mg Oral Given 07/30/20 0424)  prochlorperazine (COMPAZINE) injection 5 mg (5 mg Intravenous Given 07/30/20 0422)  diphenhydrAMINE (BENADRYL) injection 12.5 mg (12.5 mg Intravenous Given 07/30/20 0423)  sodium chloride 0.9 % bolus 1,000 mL (0 mLs Intravenous Stopped 07/30/20 0641)  diazepam (VALIUM) injection 5 mg (5 mg Intravenous Given 07/30/20 0620)    ED Course  I have reviewed the triage vital signs and the nursing notes.  Pertinent labs & imaging results that were available during my care of the patient were reviewed by me and considered in my medical decision making (see chart for details).    MDM Rules/Calculators/A&P                          69 yo F with a chief complaints of dizziness.  This is a recurrent issue for her.  Feels like her vertigo.  She feels very dizzy and the room spins when she turns her head in a certain direction.  Not dizzy currently but worried if she stands up it will recur.  Her neuro exam she has difficulty with finger-to-nose and is unable to touch my finger with her finger, also seems to have trouble comprehending that she needs to look at my hand when she does that portion of the exam.  With that being abnormal though in the patient having dizziness lasting longer than typical I did discuss with her getting an MRI to evaluate for stroke.   This she is adamant on not obtaining.  We will treat symptomatically.  Obtain blood work.  Reassess.  No significant blood work cause for her dizziness.  Alcohol level is negative.  No significant anemia.  Given a liter of IV fluids and headache cocktail and meclizine with some improvement though when there was an attempt to ambulate the patient felt too unwell to walk.  We will give a dose of IV Valium and reassess.  CT head.    Signed out to Dr. Gilford Raid, please see her note for further details of care.   The patients results and plan were reviewed and discussed.   Any x-rays performed were independently reviewed by myself.   Differential diagnosis were considered with the presenting HPI.  Medications  meclizine (ANTIVERT) tablet 25 mg (25 mg Oral Given 07/30/20 0424)  prochlorperazine (COMPAZINE) injection 5 mg (5 mg Intravenous Given 07/30/20 0422)  diphenhydrAMINE (BENADRYL) injection 12.5 mg (12.5 mg Intravenous Given 07/30/20 0423)  sodium chloride 0.9 %  bolus 1,000 mL (0 mLs Intravenous Stopped 07/30/20 0641)  diazepam (VALIUM) injection 5 mg (5 mg Intravenous Given 07/30/20 0620)    Vitals:   07/30/20 0400 07/30/20 0500 07/30/20 0607 07/30/20 0655  BP: 137/74 (!) 154/74 (!) 162/74 (!) 164/103  Pulse: 79 75 77 76  Resp: 13 12 16 16   Temp:      TempSrc:      SpO2: 95% 93% 97% 100%  Weight:      Height:        Final diagnoses:  Vertigo     Final Clinical Impression(s) / ED Diagnoses Final diagnoses:  Vertigo    Rx / DC Orders ED Discharge Orders         Ordered    Ambulatory referral to Neurology       Comments: vertigo   07/30/20 [place], [place], DO 07/30/20 [phone]

## 2020-07-30 NOTE — Telephone Encounter (Signed)
[name]'s spouse, Jeneen Rinks, called to inform that she went to the ER today for vertigo and given generic Valium. He wants to make sure that her other medicines don't interact with it. His number is [phone].

## 2020-08-18 ENCOUNTER — Ambulatory Visit (INDEPENDENT_AMBULATORY_CARE_PROVIDER_SITE_OTHER): Payer: Medicare Other | Admitting: Psychiatry

## 2020-08-18 ENCOUNTER — Encounter: Payer: Self-pay | Admitting: Psychiatry

## 2020-08-18 ENCOUNTER — Other Ambulatory Visit: Payer: Self-pay

## 2020-08-18 DIAGNOSIS — F5105 Insomnia due to other mental disorder: Secondary | ICD-10-CM

## 2020-08-18 DIAGNOSIS — F4001 Agoraphobia with panic disorder: Secondary | ICD-10-CM | POA: Diagnosis not present

## 2020-08-18 DIAGNOSIS — G3184 Mild cognitive impairment, so stated: Secondary | ICD-10-CM

## 2020-08-18 DIAGNOSIS — F411 Generalized anxiety disorder: Secondary | ICD-10-CM | POA: Diagnosis not present

## 2020-08-18 DIAGNOSIS — F3181 Bipolar II disorder: Secondary | ICD-10-CM

## 2020-08-18 DIAGNOSIS — H8113 Benign paroxysmal vertigo, bilateral: Secondary | ICD-10-CM

## 2020-08-18 DIAGNOSIS — F40240 Claustrophobia: Secondary | ICD-10-CM

## 2020-08-18 NOTE — Progress Notes (Signed)
MABLE OROS [id number] Feb 04, 1951 69 y.o.    Subjective:   Patient ID:  [name] is a 69 y.o. (DOB 06/08/1951) female.  Chief Complaint:  Chief Complaint  Patient presents with  . Follow-up  . Anxiety  . Depression  . Stress    health    Anxiety Symptoms include dizziness and nervous/anxious behavior. Patient reports no chest pain, confusion, decreased concentration or suicidal ideas.        Beckie Salts Khamis presents to the office today for follow-up of anxiety and depression and poor cognition.  At her visit October 12, 2018.  Because of her poor cognition which did seem to get even worse lately with her memory and given a negative unremarkable medical work-up by her primary care doctor the decision was made to change her from Xanax which she has been on for years to lorazepam.  Lorazepam often has less cognitive side effects than does alprazolam.  She had a lot of difficulty with headaches and insomnia with the transition.  She called several times in that transition.  Her husband reported that her cognition was better after the transition however.  She was satisfied with the Ativan.  There was an attempt to reduce her lamotrigine to 150 mg also in hopes of improving cognition but it was uncertain at the last visit where there she had actually done so.  visit December 2020 without med changes.  She had continued to do cognitively better off alprazolam and on lorazepam.  seen March 2021.   Better than December visit.  Feeling good and less down.  Adjusting time of med helped excessively sleep.  Brief hypomanic episode resolved where she cleaned all night.  Sleep 10 hours . No med changes.  12/23/19 appt.  Noted: More depression, crying and racing thoughts and distressed to the point of wondering if needed the hospital.  Sleep got worse and needed Seroquel IR 200 also bc went 2 days without sleep.  It worked and helped sleep.  Worry a lot and melancholy this week.  Cries  over what happens dealing with her daugher.[name] has prostate and kidney cancer again.  Had surgery June 10, 2019 and she's cried a lot.  He's incontinent and very uncomfortable.  He usually has positive attitude.   Worry over his health and how she'll do if something happens to him.  She's afraid of Covid.  Trusting in God usually.  She realizes she  Needs to drive occasionally bc of his health.  [name] had prostate CA and lived to 6 yo.  Uncle died of it.  Not markedly depressed. Can't walk much dT plantar fascitis. Also stressed over [name] 67 yo not doing school work and causing problems.  Her father [name] is a bad parent and not helpful.  Stressed over [name] and downs too and they have periodic conflict.   More down and anxious.   Guilt tendency.   Patient reports difficulty with sleep initiation or maintenance in part DT anxiety and racing thoughts.Intermittent sleep problems with days and nights mixed up.  Not napping. Not manic.[name] Denies appetite disturbance.  Patient reports that energy and motivation have been good.  Patient has some difficulty with concentration.  Chronic memory problems.   Patient denies any suicidal ideation. Plan:  No changes in meds indicated except agree to take the extra Seroquel 200 IR HS for mixed manic sx that are worse right now.  May be having mixed seasonal sx.  02/20/2020 appointment with  the following noted: Doing fairly good except chronic worry over [name].  Some days cries a lot over it.  D having a hard time lately.  Still easily stressed but not more than usual. Stopped extra quetiapine 200 mg HS bc no longer needed it. Anxiety is chronic but at baseline as is depression and not manic now. H helps her take the meds.   Memory is still bad but has been worse when on Xanax instead fo Ativan.  Sleep is fine from 10 to 9 which is much better than in the past.  Occ spells of staying up for 2 days and then needs to take quetiapine.   [name]  goes for [facility] soon for cancer check up. Plan: no med changes  05/21/20 appt with following noted: "A lot of problems".  Can't drive bc fear and anxiety.  Needed to drive H and couldn't. Fall off toilet twice. Dizzy and HA 2 days ago with a lot of crying and stress with daughter. Leaving home even coming here causes anxiety.    Cataract surgery twice. Fear of Bill being diagnosed with recurrent cancer. Chronic anxiety greather than depression.   Don't know what to do with daughter. Sleep, appetite and energy are OK. Tolerating meds. Sees [name] one day weekly. H helps with meds bc she's forgetful and easily confused.  Loses train of thoughts. Dog has separation anxiety and so they don't go out much.  Plan: no med changes  08/18/20 appt with following noted: 2 ER visitis with vertigo.  RX diazepam and meclizine. Rarely takes former but takes latter regularly. Shakes so bad it made her cry and scream.  Started PT. Continues lorazepam low dose 0.5 mg AM and 1.0 mg HS.  Still has dizziness and vertigo. Memory is still bad and H administers meds with pill box. Chronic anxiety.  Depression manageable. Sleep OK and tolerates meds otherwise. [name] driving her crazy.  Prior psychiatric medication trials include paroxetine, Lexapro,  risperidone 4 mg which was sedating, aripiprazole,  perphenazine, Seroquel 800, olanzapine for anxiety, and  lithium which was not tolerated even at very low dosages.  Topamax for anxiety, She was intolerant of both Namenda and Aricept.   Lamotrigine 300.   Xanax, Ativan.   This is not an exhaustive list.  Review of Systems:  Review of Systems  Eyes: Positive for visual disturbance.  Cardiovascular: Negative for chest pain.  Musculoskeletal: Positive for arthralgias, back pain and gait problem.  Neurological: Positive for dizziness. Negative for tremors and weakness.  Psychiatric/Behavioral: Negative for agitation, behavioral problems, confusion, decreased  concentration, dysphoric mood, hallucinations, self-injury, sleep disturbance and suicidal ideas. The patient is nervous/anxious. The patient is not hyperactive.     Medications: I have reviewed the patient's current medications.  Current Outpatient Medications  Medication Sig Dispense Refill  . amLODipine (NORVASC) 5 MG tablet     . aspirin 81 MG tablet Take 81 mg by mouth daily.    . busPIRone (BUSPAR) 30 MG tablet Take 1 tablet (30 mg total) by mouth 2 (two) times daily. 180 tablet 3  . cefpodoxime (VANTIN) 100 MG tablet Take 100 mg by mouth 2 (two) times daily.  0  . cephALEXin (KEFLEX) 250 MG capsule Take 250 mg by mouth at bedtime.    . diazepam (VALIUM) 5 MG tablet Take 1 tablet (5 mg total) by mouth every 12 (twelve) hours as needed (dizziness). 10 tablet 0  . diclofenac sodium (VOLTAREN) 1 % GEL Apply  2 g topically 4 (four) times daily. Rub into affected area of foot 2 to 4 times daily 100 g 2  . esomeprazole (NEXIUM) 40 MG capsule Take 1 capsule (40 mg total) by mouth daily. 30 capsule 0  . etodolac (LODINE) 400 MG tablet Take 400 mg by mouth 2 (two) times daily.    . furosemide (LASIX) 20 MG tablet Take 20 mg by mouth daily as needed (for severe swelling).    [name] glimepiride (AMARYL) 1 MG tablet Take 1 mg by mouth daily.    [name] lamoTRIgine (LAMICTAL) 150 MG tablet TAKE 1 TABLET BY MOUTH  TWICE DAILY 180 tablet 3  . lansoprazole (PREVACID) 15 MG capsule Take 15 mg by mouth daily.    [name] levocetirizine (XYZAL) 5 MG tablet Take 5 mg by mouth every evening.    [name] levothyroxine (SYNTHROID) 100 MCG tablet Take 100 mcg by mouth daily.    [name] levothyroxine (SYNTHROID) 88 MCG tablet TAKE 1 TABLET BY MOUTH EVERY MORNING ON AN EMPTY STOMACH    . lisinopril (PRINIVIL,ZESTRIL) 20 MG tablet Take 20 mg by mouth daily.    [name] LORazepam (ATIVAN) 0.5 MG tablet TAKE 1 TABLET BY MOUTH IN  THE MORNING AND 2 TABLETS  IN THE EVENING 270 tablet 1  . meclizine (ANTIVERT) 25 MG tablet Take 1 tablet (25 mg total) by  mouth 3 (three) times daily as needed for dizziness. 30 tablet 0  . meloxicam (MOBIC) 15 MG tablet Take 15 mg by mouth daily.    . Methylfol-Methylcob-Acetylcyst (L-METHYL-MC NAC) 6-2-600 MG TABS TK 1 T PO QD    . metoprolol succinate (TOPROL-XL) 50 MG 24 hr tablet Take 50 mg by mouth daily. Take with or immediately following a meal.    . phenazopyridine (PYRIDIUM) 200 MG tablet Take 200 mg by mouth 3 (three) times daily as needed for pain.     . polyethylene glycol (MIRALAX / GLYCOLAX) packet Take 17 g by mouth 2 (two) times daily as needed for mild constipation.    . potassium chloride SA (K-DUR,KLOR-CON) 20 MEQ tablet Take 20 mEq by mouth daily.    . QUEtiapine (SEROQUEL XR) 400 MG 24 hr tablet Take 2 tablets (800 mg total) by mouth at bedtime. 180 tablet 3  . QUEtiapine (SEROQUEL) 200 MG tablet TAKE 1 TABLET BY MOUTH AT  BEDTIME AS NEEDED FOR SLEEP 90 tablet 3  . simvastatin (ZOCOR) 40 MG tablet Take 40 mg by mouth daily.    . Vitamin D, Ergocalciferol, (DRISDOL) 1.25 MG (50000 UNIT) CAPS capsule TAKE 1 CAPSULE (50,000  UNITS TOTAL) BY MOUTH TWICE WEEKLY 26 capsule 3   No current facility-administered medications for this visit.    Medication Side Effects: None  Allergies:  Allergies  Allergen Reactions  . Celexa [Citalopram Hydrobromide] Nausea Only  . Maxzide [Triamterene-Hctz]     Hurt patients kidneys  . Metformin And Related Nausea And Vomiting  . Other Nausea And Vomiting, Nausea Only and Other (See Comments)    Hurt patients kidneys  . Reglan [Metoclopramide] Nausea Only  . Risperdal [Risperidone] Nausea Only    Also causes hallucinations  . Trazodone And Nefazodone Nausea Only  . Zestril [Lisinopril] Nausea Only  . Lithium Palpitations and Other (See Comments)    Shakes and delusional pt started seeing things      Past Medical History:  Diagnosis Date  . Anxiety   . Arthritis   . Asthma   . Bipolar 1 disorder (HCC)   . CHF (congestive  heart failure) (HCC)   .  Complication of anesthesia    left a bad taste in my mouth it has lasted since january   . Depression   . Diabetes mellitus   . Hernia    umbilical  . Hyperlipidemia   . Hypertension   . Hypothyroidism   . Pericarditis, viral 2010 October  . Sleep apnea    uses cpap setting of 15    Family History  Problem Relation Age of Onset  . Diabetes Mother   . Hypertension Mother   . Hyperlipidemia Mother   . Diabetes Father   . Hypertension Father   . Hyperlipidemia Father   . Hypertension Sister     Social History   Socioeconomic History  . Marital status: Married    Spouse name: Not on file  . Number of children: Not on file  . Years of education: Not on file  . Highest education level: Not on file  Occupational History  . Not on file  Tobacco Use  . Smoking status: Never Smoker  . Smokeless tobacco: Never Used  Substance and Sexual Activity  . Alcohol use: No  . Drug use: No  . Sexual activity: Not on file  Other Topics Concern  . Not on file  Social History Narrative  . Not on file   Social Determinants of Health   Financial Resource Strain: Not on file  Food Insecurity: Not on file  Transportation Needs: Not on file  Physical Activity: Not on file  Stress: Not on file  Social Connections: Not on file  Intimate Partner Violence: Not on file    Past Medical History, Surgical history, Social history, and Family history were reviewed and updated as appropriate.   Please see review of systems for further details on the patient's review from today.   Objective:   Physical Exam:  There were no vitals taken for this visit.  Physical Exam Constitutional:      General: She is not in acute distress. Musculoskeletal:        General: No deformity.  Neurological:     Mental Status: She is alert and oriented to person, place, and time.     Cranial Nerves: No dysarthria.     Coordination: Coordination normal.  Psychiatric:        Attention and Perception:  Attention and perception normal. She does not perceive auditory or visual hallucinations.        Mood and Affect: Mood is anxious. Mood is not depressed. Affect is not labile, blunt, angry, tearful or inappropriate.        Speech: Speech normal. Speech is not slurred.        Behavior: Behavior normal. Behavior is cooperative.        Thought Content: Thought content normal. Thought content is not paranoid or delusional. Thought content does not include homicidal or suicidal ideation. Thought content does not include homicidal or suicidal plan.        Cognition and Memory: Cognition and memory normal.        Judgment: Judgment normal.     Comments: Insight fair and chronically stressed. Fair judgment. Less manic dysphoria than usual.   Talkative chronically per usual.  Lab Review:     Component Value Date/Time   NA 140 07/30/2020 0351   K 4.0 07/30/2020 0351   CL 107 07/30/2020 0351   CO2 25 07/30/2020 0351   GLUCOSE 215 (H) 07/30/2020 0351   GLUCOSE 154 (H) 09/01/2006 1100  BUN 13 07/30/2020 0351   CREATININE 0.78 07/30/2020 0351   CREATININE 0.93 07/19/2011 1110   CALCIUM 10.2 07/30/2020 0351   PROT 6.8 07/30/2020 0351   ALBUMIN 3.7 07/30/2020 0351   AST 30 07/30/2020 0351   ALT 23 07/30/2020 0351   ALKPHOS 99 07/30/2020 0351   BILITOT 0.8 07/30/2020 0351   GFRNONAA >60 07/30/2020 0351   GFRAA >60 03/23/2016 1933       Component Value Date/Time   WBC 6.3 07/30/2020 0351   RBC 4.45 07/30/2020 0351   HGB 13.9 07/30/2020 0351   HCT 44.5 07/30/2020 0351   PLT 174 07/30/2020 0351   MCV 100.0 07/30/2020 0351   MCH 31.2 07/30/2020 0351   MCHC 31.2 07/30/2020 0351   RDW 13.3 07/30/2020 0351   LYMPHSABS 1.1 07/30/2020 0351   MONOABS 0.4 07/30/2020 0351   EOSABS 0.2 07/30/2020 0351   BASOSABS 0.1 07/30/2020 0351   Prior lamotrigine level in 2018 on this dosage was 3.6.  Not particularly high.  Per Dr. Marcos Eke: Clinical Impressions: Cognitive complaints are likely due to  underlying psychiatric disorder (bipolar disorder/anxiety disorder with racing thoughts). Diagnostic impressions based on test performances are limited due to the patient's severe inability to fully attend to the tasks. However, based on clinical presentation, I highly doubt the presence of a neurodegenerative dementia. It is much more likely that her racing thoughts and psychiatric disorders are interfering with cognitive   No results found for: POCLITH, LITHIUM   No results found for: PHENYTOIN, PHENOBARB, VALPROATE, CBMZ   .res Assessment: Plan:    [name] disorder ([place])  Panic disorder with agoraphobia  Generalized anxiety disorder  Mild cognitive impairment  Insomnia due to mental condition  Claustrophobia  Benign paroxysmal positional vertigo due to bilateral vestibular disorder   Thayer Headings has chronic severe anxiety with panic attacks as well as bipolar disorder with a history of significant lability.  Overall mood lability has been improved with the current med med regimen.  She remains chronically anxious.  She also has mild cognitive impairment as a complicating factor.  She needs her husband's assistance to manage her medications.  At the visit in February 2020 Tranice was more acutely confused recently for no clear reason.   She had a negative medical work-up for causes of short-term memory problems and confusion.  Therefore the decision was made to try switching her from alprazolam to lorazepam at the lowest possible dose in hopes of improving her cognition.  This was successful and her cognition was improved significantly and her husband verified this.    Mixed manic sx resolved for now. No med changes today. Continue lorazepam, lamotrigine, quetiapine, buspirone. Can't reduce lorazepam any further but she's aware it might affect her memory.  Discussed potential metabolic side effects associated with atypical antipsychotics, as well as potential risk for movement side  effects. Advised pt to contact office if movement side effects occur.   We discussed the short-term risks associated with benzodiazepines including sedation and increased fall risk among others.  Discussed long-term side effect risk including dependence, potential withdrawal symptoms, and the potential eventual dose-related risk of dementia.  Use of lorazepam remains medically necessary to manage her panic disorder. Disc risk combo with diazepam but she rarely takes it.  Also combo with meclizine.  Pt is chronically needy and requires extended appts DT chronic anxiety and easily stressed.  Supportive and cognitive work are done with the patient on her excessive guilt and now stress of vertigo.  Cannot  drive now and wasn't much before.  Also stressed  By D and GD and how to deal with GD's school refusal.  For example taking on responsibility for things with her granddaughter that she can fact not control.  Also addressing her fears related to her husband's diagnosis of cancer. Supportive therapy dealing with chronic stressors and difficulty with daughter.   Pt doesn't feel able to fix her meds by herself.  Disc stressor with staff member  No med changes indicated.  this appt was 35 mins.  FU 3-4 mos  [name], MD, DFAPA   Please see After Visit Summary for patient specific instructions.  No future appointments.  No orders of the defined types were placed in this encounter.     -------------------------------

## 2020-08-28 ENCOUNTER — Other Ambulatory Visit: Payer: Self-pay | Admitting: Psychiatry

## 2020-08-28 DIAGNOSIS — I129 Hypertensive chronic kidney disease with stage 1 through stage 4 chronic kidney disease, or unspecified chronic kidney disease: Secondary | ICD-10-CM | POA: Diagnosis not present

## 2020-08-28 DIAGNOSIS — E7800 Pure hypercholesterolemia, unspecified: Secondary | ICD-10-CM | POA: Diagnosis not present

## 2020-08-28 DIAGNOSIS — F319 Bipolar disorder, unspecified: Secondary | ICD-10-CM | POA: Diagnosis not present

## 2020-08-28 DIAGNOSIS — N183 Chronic kidney disease, stage 3 unspecified: Secondary | ICD-10-CM | POA: Diagnosis not present

## 2020-08-28 DIAGNOSIS — J45909 Unspecified asthma, uncomplicated: Secondary | ICD-10-CM | POA: Diagnosis not present

## 2020-08-28 DIAGNOSIS — E039 Hypothyroidism, unspecified: Secondary | ICD-10-CM | POA: Diagnosis not present

## 2020-08-28 DIAGNOSIS — I10 Essential (primary) hypertension: Secondary | ICD-10-CM | POA: Diagnosis not present

## 2020-08-28 DIAGNOSIS — E1129 Type 2 diabetes mellitus with other diabetic kidney complication: Secondary | ICD-10-CM | POA: Diagnosis not present

## 2020-08-28 DIAGNOSIS — K219 Gastro-esophageal reflux disease without esophagitis: Secondary | ICD-10-CM | POA: Diagnosis not present

## 2020-08-28 MED ORDER — QUETIAPINE FUMARATE 200 MG PO TABS
200.0000 mg | ORAL_TABLET | Freq: Every evening | ORAL | 3 refills | Status: DC | PRN
Start: 2020-08-28 — End: 2021-05-11

## 2020-08-28 MED ORDER — BUSPIRONE HCL 30 MG PO TABS
30.0000 mg | ORAL_TABLET | Freq: Two times a day (BID) | ORAL | 3 refills | Status: DC
Start: 2020-08-28 — End: 2021-05-11

## 2020-08-28 MED ORDER — LORAZEPAM 0.5 MG PO TABS
ORAL_TABLET | ORAL | 1 refills | Status: DC
Start: 2020-08-28 — End: 2021-03-29

## 2020-08-28 MED ORDER — LAMOTRIGINE 150 MG PO TABS
150.0000 mg | ORAL_TABLET | Freq: Two times a day (BID) | ORAL | 3 refills | Status: DC
Start: 2020-08-28 — End: 2021-06-15

## 2020-08-28 MED ORDER — QUETIAPINE FUMARATE ER 400 MG PO TB24
800.0000 mg | ORAL_TABLET | Freq: Every day | ORAL | 3 refills | Status: DC
Start: 2020-08-28 — End: 2021-06-11

## 2020-08-31 DIAGNOSIS — M6281 Muscle weakness (generalized): Secondary | ICD-10-CM | POA: Diagnosis not present

## 2020-08-31 DIAGNOSIS — R42 Dizziness and giddiness: Secondary | ICD-10-CM | POA: Diagnosis not present

## 2020-08-31 DIAGNOSIS — R269 Unspecified abnormalities of gait and mobility: Secondary | ICD-10-CM | POA: Diagnosis not present

## 2020-09-01 DIAGNOSIS — E119 Type 2 diabetes mellitus without complications: Secondary | ICD-10-CM | POA: Diagnosis not present

## 2020-09-01 DIAGNOSIS — Z961 Presence of intraocular lens: Secondary | ICD-10-CM | POA: Diagnosis not present

## 2020-09-17 DIAGNOSIS — M6281 Muscle weakness (generalized): Secondary | ICD-10-CM | POA: Diagnosis not present

## 2020-09-17 DIAGNOSIS — R269 Unspecified abnormalities of gait and mobility: Secondary | ICD-10-CM | POA: Diagnosis not present

## 2020-09-17 DIAGNOSIS — R42 Dizziness and giddiness: Secondary | ICD-10-CM | POA: Diagnosis not present

## 2020-10-15 DIAGNOSIS — K219 Gastro-esophageal reflux disease without esophagitis: Secondary | ICD-10-CM | POA: Diagnosis not present

## 2020-10-15 DIAGNOSIS — F319 Bipolar disorder, unspecified: Secondary | ICD-10-CM | POA: Diagnosis not present

## 2020-10-15 DIAGNOSIS — E039 Hypothyroidism, unspecified: Secondary | ICD-10-CM | POA: Diagnosis not present

## 2020-10-15 DIAGNOSIS — N183 Chronic kidney disease, stage 3 unspecified: Secondary | ICD-10-CM | POA: Diagnosis not present

## 2020-10-15 DIAGNOSIS — E1129 Type 2 diabetes mellitus with other diabetic kidney complication: Secondary | ICD-10-CM | POA: Diagnosis not present

## 2020-10-15 DIAGNOSIS — J45909 Unspecified asthma, uncomplicated: Secondary | ICD-10-CM | POA: Diagnosis not present

## 2020-10-15 DIAGNOSIS — E7800 Pure hypercholesterolemia, unspecified: Secondary | ICD-10-CM | POA: Diagnosis not present

## 2020-10-15 DIAGNOSIS — I10 Essential (primary) hypertension: Secondary | ICD-10-CM | POA: Diagnosis not present

## 2020-10-15 DIAGNOSIS — I129 Hypertensive chronic kidney disease with stage 1 through stage 4 chronic kidney disease, or unspecified chronic kidney disease: Secondary | ICD-10-CM | POA: Diagnosis not present

## 2020-10-22 DIAGNOSIS — I10 Essential (primary) hypertension: Secondary | ICD-10-CM | POA: Diagnosis not present

## 2020-10-22 DIAGNOSIS — Z6841 Body Mass Index (BMI) 40.0 and over, adult: Secondary | ICD-10-CM | POA: Diagnosis not present

## 2020-10-22 DIAGNOSIS — K219 Gastro-esophageal reflux disease without esophagitis: Secondary | ICD-10-CM | POA: Diagnosis not present

## 2020-10-22 DIAGNOSIS — N183 Chronic kidney disease, stage 3 unspecified: Secondary | ICD-10-CM | POA: Diagnosis not present

## 2020-10-22 DIAGNOSIS — F319 Bipolar disorder, unspecified: Secondary | ICD-10-CM | POA: Diagnosis not present

## 2020-10-22 DIAGNOSIS — E1122 Type 2 diabetes mellitus with diabetic chronic kidney disease: Secondary | ICD-10-CM | POA: Diagnosis not present

## 2020-10-22 DIAGNOSIS — G4730 Sleep apnea, unspecified: Secondary | ICD-10-CM | POA: Diagnosis not present

## 2020-10-22 DIAGNOSIS — E7800 Pure hypercholesterolemia, unspecified: Secondary | ICD-10-CM | POA: Diagnosis not present

## 2020-10-22 DIAGNOSIS — E039 Hypothyroidism, unspecified: Secondary | ICD-10-CM | POA: Diagnosis not present

## 2020-10-22 DIAGNOSIS — E1129 Type 2 diabetes mellitus with other diabetic kidney complication: Secondary | ICD-10-CM | POA: Diagnosis not present

## 2020-10-23 ENCOUNTER — Telehealth: Payer: Self-pay | Admitting: Psychiatry

## 2020-10-23 NOTE — Telephone Encounter (Signed)
She says she took half a Seroquel because she couldn't sleep but other then that no med changes.She went to PCP yesterday and he could not identify the cause and instructed her to contact us.She said she will monitor it and give [place] a update.

## 2020-10-23 NOTE — Telephone Encounter (Signed)
I do not see that we have changed any medications or that anyone else is changing any medications that would cause this.  Make sure she is not drinking much caffeine.  Make sure she is not increased or decreased any medicine slightly and if so tell us which ones.  Monitor it for another week or so and see if it just goes away and call us if it does not.

## 2020-10-23 NOTE — Telephone Encounter (Signed)
Next visit is 11/17/20. [name] called to say she has been jerking and said it just started a few days ago. She is wondering if one of her medications might be causing this? Her number is [phone].

## 2020-10-29 ENCOUNTER — Ambulatory Visit: Payer: Medicare Other | Admitting: Podiatry

## 2020-11-17 ENCOUNTER — Other Ambulatory Visit: Payer: Self-pay

## 2020-11-17 ENCOUNTER — Ambulatory Visit (INDEPENDENT_AMBULATORY_CARE_PROVIDER_SITE_OTHER): Payer: Medicare HMO | Admitting: Psychiatry

## 2020-11-17 ENCOUNTER — Encounter: Payer: Self-pay | Admitting: Psychiatry

## 2020-11-17 DIAGNOSIS — G3184 Mild cognitive impairment, so stated: Secondary | ICD-10-CM

## 2020-11-17 DIAGNOSIS — F40240 Claustrophobia: Secondary | ICD-10-CM

## 2020-11-17 DIAGNOSIS — H8113 Benign paroxysmal vertigo, bilateral: Secondary | ICD-10-CM | POA: Diagnosis not present

## 2020-11-17 DIAGNOSIS — F3181 Bipolar II disorder: Secondary | ICD-10-CM

## 2020-11-17 DIAGNOSIS — F5105 Insomnia due to other mental disorder: Secondary | ICD-10-CM

## 2020-11-17 DIAGNOSIS — F4001 Agoraphobia with panic disorder: Secondary | ICD-10-CM | POA: Diagnosis not present

## 2020-11-17 DIAGNOSIS — F411 Generalized anxiety disorder: Secondary | ICD-10-CM

## 2020-11-17 NOTE — Progress Notes (Signed)
[name] [id number] 06-17-1951 70 y.o.    Subjective:   Patient ID:  [name] is a 71 y.o. (DOB 1950/12/24) female.  Chief Complaint:  Chief Complaint  Patient presents with  . Follow-up  . Anxiety  . Manic Behavior  . Medication Problem    Anxiety Symptoms include confusion and nervous/anxious behavior. Patient reports no chest pain, decreased concentration, dizziness or suicidal ideas.        [name] presents to the office today for follow-up of anxiety and depression and poor cognition.  At her visit October 12, 2018.  Because of her poor cognition which did seem to get even worse lately with her memory and given a negative unremarkable medical work-up by her primary care doctor the decision was made to change her from Xanax which she has been on for years to lorazepam.  Lorazepam often has less cognitive side effects than does alprazolam.  She had a lot of difficulty with headaches and insomnia with the transition.  She called several times in that transition.  Her husband reported that her cognition was better after the transition however.  She was satisfied with the Ativan.  There was an attempt to reduce her lamotrigine to 150 mg also in hopes of improving cognition but it was uncertain at the last visit where there she had actually done so.  visit December 2020 without med changes.  She had continued to do cognitively better off alprazolam and on lorazepam.  seen March 2021.   Better than December visit.  Feeling good and less down.  Adjusting time of med helped excessively sleep.  Brief hypomanic episode resolved where she cleaned all night.  Sleep 10 hours . No med changes.  12/23/19 appt.  Noted: More depression, crying and racing thoughts and distressed to the point of wondering if needed the hospital.  Sleep got worse and needed Seroquel IR 200 also bc went 2 days without sleep.  It worked and helped sleep.  Worry a lot and melancholy this week.   Cries over what happens dealing with her daugher.[name] has prostate and kidney cancer again.  Had surgery June 10, 2019 and she's cried a lot.  He's incontinent and very uncomfortable.  He usually has positive attitude.   Worry over his health and how she'll do if something happens to him.  She's afraid of Covid.  Trusting in God usually.  She realizes she  Needs to drive occasionally bc of his health.  [name]'s father had prostate CA and lived to 74 yo.  Uncle died of it.  Not markedly depressed. Can't walk much dT plantar fascitis. Also stressed over [name] 68 yo not doing school work and causing problems.  Her father [name] is a bad parent and not helpful.  Stressed over [name] and downs too and they have periodic conflict.   More down and anxious.   Guilt tendency.   Patient reports difficulty with sleep initiation or maintenance in part DT anxiety and racing thoughts.Intermittent sleep problems with days and nights mixed up.  Not napping. Not manic.[name] Denies appetite disturbance.  Patient reports that energy and motivation have been good.  Patient has some difficulty with concentration.  Chronic memory problems.   Patient denies any suicidal ideation. Plan:  No changes in meds indicated except agree to take the extra Seroquel 200 IR HS for mixed manic sx that are worse right now.  May be having mixed seasonal sx.  02/20/2020 appointment with the following  noted: Doing fairly good except chronic worry over [name] and Yolanda [name].  Some days cries a lot over it.  [name] having a hard time lately.  Still easily stressed but not more than usual. Stopped extra quetiapine 200 mg HS bc no longer needed it. Anxiety is chronic but at baseline as is depression and not manic now. H helps her take the meds.   Memory is still bad but has been worse when on Xanax instead fo Ativan.  Sleep is fine from 10 to 9 which is much better than in the past.  Occ spells of staying up for 2 days and then needs to take quetiapine.   H  [name] goes for FU soon for cancer check up. Plan: no med changes  05/21/20 appt with following noted: "A lot of problems".  Can't drive bc fear and anxiety.  Needed to drive H and couldn't. Fall off toilet twice. Dizzy and HA 2 days ago with a lot of crying and stress with daughter. Leaving home even coming here causes anxiety.    Cataract surgery twice. Fear of [name] being diagnosed with recurrent cancer. Chronic anxiety greather than depression.   Don't know what to do with daughter. Sleep, appetite and energy are OK. Tolerating meds. Sees [name] one day weekly. H helps with meds bc she's forgetful and easily confused.  Loses train of thoughts. Dog has separation anxiety and so they don't go out much.  Plan: no med changes  08/18/20 appt with following noted: 2 ER visitis with vertigo.  RX diazepam and meclizine. Rarely takes former but takes latter regularly. Shakes so bad it made her cry and scream.  Started PT. Continues lorazepam low dose 0.5 mg AM and 1.0 mg HS.  Still has dizziness and vertigo. Memory is still bad and H administers meds with pill box. Chronic anxiety.  Depression manageable. Sleep OK and tolerates meds otherwise. [name] driving her crazy. Plan: no med changes  11/17/20 appt with following noted: Has had periods of vertigo and meds for it.  Then had manic sx with cleaning and couldn't sleep for 24 hours. Stopped quetiapine 200 and continued quetiapine XR 800. Also started diazepam 5 mg BID for vertigo and continued lorazepam for anxiety.  No meclizine now.  Vertigo stopped and manic sx stopped.  Tolerating meds now. Still feels [name] is mean to her too often and she will cry over it. Mandy's ExH [name] in jail and is the father of Mandy's [name] [name] 45 yo.  Will be there for 3 mos. Not markedly depressed.  But hates herself some days.  Still memory problems about the same.  Sleeping OK now.  Periods of mood swings. Easily anxious with relatives visiting.    Prior  psychiatric medication trials include paroxetine, Lexapro,  risperidone 4 mg which was sedating, aripiprazole,  perphenazine, Seroquel 1000, olanzapine for anxiety, and  lithium which was not tolerated even at very low dosages.  Topamax for anxiety, She was intolerant of both Namenda and Aricept.   Lamotrigine 300.   Xanax, Ativan, diazepam 5 mg BID   This is not an exhaustive list.   At the visit in February 2020 Jesse was more acutely confused recently for no clear reason.   She had a negative medical work-up for causes of short-term memory problems and confusion.  Therefore the decision was made to try switching her from alprazolam to lorazepam at the lowest possible dose in hopes of improving her cognition.  This was successful and  her cognition was improved significantly and her husband verified this.    Review of Systems:  Review of Systems  Eyes: Positive for visual disturbance.  Cardiovascular: Negative for chest pain.  Musculoskeletal: Positive for arthralgias, back pain and gait problem.  Neurological: Negative for dizziness, tremors and weakness.  Psychiatric/Behavioral: Positive for confusion. Negative for agitation, behavioral problems, decreased concentration, dysphoric mood, hallucinations, self-injury, sleep disturbance and suicidal ideas. The patient is nervous/anxious. The patient is not hyperactive.     Medications: I have reviewed the patient's current medications.  Current Outpatient Medications  Medication Sig Dispense Refill  . amLODipine (NORVASC) 5 MG tablet     . aspirin 81 MG tablet Take 81 mg by mouth daily.    . busPIRone (BUSPAR) 30 MG tablet Take 1 tablet (30 mg total) by mouth 2 (two) times daily. 180 tablet 3  . cefpodoxime (VANTIN) 100 MG tablet Take 100 mg by mouth 2 (two) times daily.  0  . diazepam (VALIUM) 5 MG tablet Take 1 tablet (5 mg total) by mouth every 12 (twelve) hours as needed (dizziness). 10 tablet 0  . esomeprazole (NEXIUM) 40 MG  capsule Take 1 capsule (40 mg total) by mouth daily. 30 capsule 0  . etodolac (LODINE) 400 MG tablet Take 400 mg by mouth 2 (two) times daily.    . furosemide (LASIX) 20 MG tablet Take 20 mg by mouth daily as needed (for severe swelling).    [name] glimepiride (AMARYL) 1 MG tablet Take 1 mg by mouth daily.    [name] lamoTRIgine (LAMICTAL) 150 MG tablet Take 1 tablet (150 mg total) by mouth 2 (two) times daily. 180 tablet 3  . lansoprazole (PREVACID) 15 MG capsule Take 15 mg by mouth daily.    [name] levocetirizine (XYZAL) 5 MG tablet Take 5 mg by mouth every evening.    [name] levothyroxine (SYNTHROID) 100 MCG tablet Take 100 mcg by mouth daily.    [name] lisinopril (PRINIVIL,ZESTRIL) 20 MG tablet Take 20 mg by mouth daily.    [name] LORazepam (ATIVAN) 0.5 MG tablet 1 in the AM and 2 at night 270 tablet 1  . meloxicam (MOBIC) 15 MG tablet Take 15 mg by mouth daily.    . Methylfol-Methylcob-Acetylcyst (L-METHYL-MC NAC) 6-2-600 MG TABS TK 1 T PO QD    . metoprolol succinate (TOPROL-XL) 50 MG 24 hr tablet Take 50 mg by mouth daily. Take with or immediately following a meal.    . polyethylene glycol (MIRALAX / GLYCOLAX) packet Take 17 g by mouth 2 (two) times daily as needed for mild constipation.    . potassium chloride SA (K-DUR,KLOR-CON) 20 MEQ tablet Take 20 mEq by mouth daily.    . QUEtiapine (SEROQUEL XR) 400 MG 24 hr tablet Take 2 tablets (800 mg total) by mouth at bedtime. 180 tablet 3  . simvastatin (ZOCOR) 40 MG tablet Take 40 mg by mouth daily.    . Vitamin [name], Ergocalciferol, (DRISDOL) 1.25 MG (50000 UNIT) CAPS capsule TAKE 1 CAPSULE (50,000  UNITS TOTAL) BY MOUTH TWICE WEEKLY 26 capsule 3  . cephALEXin (KEFLEX) 250 MG capsule Take 250 mg by mouth at bedtime.    . diclofenac sodium (VOLTAREN) 1 % GEL Apply 2 g topically 4 (four) times daily. Rub into affected area of foot 2 to 4 times daily 100 g 2  . levothyroxine (SYNTHROID) 88 MCG tablet TAKE 1 TABLET BY MOUTH EVERY MORNING ON AN EMPTY STOMACH (Patient not taking:  Reported on 11/17/2020)    . meclizine (  ANTIVERT) 25 MG tablet Take 1 tablet (25 mg total) by mouth 3 (three) times daily as needed for dizziness. 30 tablet 0  . phenazopyridine (PYRIDIUM) 200 MG tablet Take 200 mg by mouth 3 (three) times daily as needed for pain.     [name] QUEtiapine (SEROQUEL) 200 MG tablet Take 1 tablet (200 mg total) by mouth at bedtime as needed. for sleep (Patient not taking: Reported on 11/17/2020) 90 tablet 3   No current facility-administered medications for this visit.    Medication Side Effects: None  Allergies:  Allergies  Allergen Reactions  . Celexa [Citalopram Hydrobromide] Nausea Only  . Maxzide [Triamterene-Hctz]     Hurt patients kidneys  . Metformin And Related Nausea And Vomiting  . Other Nausea And Vomiting, Nausea Only and Other (See Comments)    Hurt patients kidneys  . Reglan [Metoclopramide] Nausea Only  . Risperdal [Risperidone] Nausea Only    Also causes hallucinations  . Trazodone And Nefazodone Nausea Only  . Zestril [Lisinopril] Nausea Only  . Lithium Palpitations and Other (See Comments)    Shakes and delusional pt started seeing things      Past Medical History:  Diagnosis Date  . Anxiety   . Arthritis   . Asthma   . Bipolar 1 disorder ([place])   . CHF (congestive heart failure) ([place])   . Complication of anesthesia    left a bad taste in my mouth it has lasted since january   . Depression   . Diabetes mellitus   . Hernia    umbilical  . Hyperlipidemia   . Hypertension   . Hypothyroidism   . Pericarditis, viral 2010 October  . Sleep apnea    uses cpap setting of 15    Family History  Problem Relation Age of Onset  . Diabetes Mother   . Hypertension Mother   . Hyperlipidemia Mother   . Diabetes Father   . Hypertension Father   . Hyperlipidemia Father   . Hypertension Sister     Social History   Socioeconomic History  . Marital status: Married    Spouse name: Not on file  . Number of children: Not on file  .  Years of education: Not on file  . Highest education level: Not on file  Occupational History  . Not on file  Tobacco Use  . Smoking status: Never Smoker  . Smokeless tobacco: Never Used  Substance and Sexual Activity  . Alcohol use: No  . Drug use: No  . Sexual activity: Not on file  Other Topics Concern  . Not on file  Social History Narrative  . Not on file   Social Determinants of Health   Financial Resource Strain: Not on file  Food Insecurity: Not on file  Transportation Needs: Not on file  Physical Activity: Not on file  Stress: Not on file  Social Connections: Not on file  Intimate Partner Violence: Not on file    Past Medical History, Surgical history, Social history, and Family history were reviewed and updated as appropriate.   Please see review of systems for further details on the patient's review from today.   Objective:   Physical Exam:  There were no vitals taken for this visit.  Physical Exam Constitutional:      General: She is not in acute distress. Musculoskeletal:        General: No deformity.  Neurological:     Mental Status: She is alert and oriented to person, place, and time.  Cranial Nerves: No dysarthria.     Coordination: Coordination normal.  Psychiatric:        Attention and Perception: Attention and perception normal. She does not perceive auditory or visual hallucinations.        Mood and Affect: Mood is anxious. Mood is not depressed. Affect is not labile, blunt, angry, tearful or inappropriate.        Speech: Speech normal. Speech is not slurred.        Behavior: Behavior normal. Behavior is cooperative.        Thought Content: Thought content normal. Thought content is not paranoid or delusional. Thought content does not include homicidal or suicidal ideation. Thought content does not include homicidal or suicidal plan.        Cognition and Memory: Cognition is impaired. Memory is impaired.        Judgment: Judgment normal.      Comments: Insight fair and chronically stressed. Fair judgment. Less manic dysphoria than usual.   Talkative chronically per usual.  Lab Review:     Component Value Date/Time   NA 140 07/30/2020 0351   K 4.0 07/30/2020 0351   CL 107 07/30/2020 0351   CO2 25 07/30/2020 0351   GLUCOSE 215 (H) 07/30/2020 0351   GLUCOSE 154 (H) 09/01/2006 1100   BUN 13 07/30/2020 0351   CREATININE 0.78 07/30/2020 0351   CREATININE 0.93 07/19/2011 1110   CALCIUM 10.2 07/30/2020 0351   PROT 6.8 07/30/2020 0351   ALBUMIN 3.7 07/30/2020 0351   AST 30 07/30/2020 0351   ALT 23 07/30/2020 0351   ALKPHOS 99 07/30/2020 0351   BILITOT 0.8 07/30/2020 0351   GFRNONAA >60 07/30/2020 0351   GFRAA >60 03/23/2016 1933       Component Value Date/Time   WBC 6.3 07/30/2020 0351   RBC 4.45 07/30/2020 0351   HGB 13.9 07/30/2020 0351   HCT 44.5 07/30/2020 0351   PLT 174 07/30/2020 0351   MCV 100.0 07/30/2020 0351   MCH 31.2 07/30/2020 0351   MCHC 31.2 07/30/2020 0351   RDW 13.3 07/30/2020 0351   LYMPHSABS 1.1 07/30/2020 0351   MONOABS 0.4 07/30/2020 0351   EOSABS 0.2 07/30/2020 0351   BASOSABS 0.1 07/30/2020 0351   Prior lamotrigine level in 2018 on this dosage was 3.6.  Not particularly high.  Per Dr. Marcos Eke: Clinical Impressions: Cognitive complaints are likely due to underlying psychiatric disorder (bipolar disorder/anxiety disorder with racing thoughts). Diagnostic impressions based on test performances are limited due to the patient's severe inability to fully attend to the tasks. However, based on clinical presentation, I highly doubt the presence of a neurodegenerative dementia. It is much more likely that her racing thoughts and psychiatric disorders are interfering with cognitive   No results found for: POCLITH, LITHIUM   No results found for: PHENYTOIN, PHENOBARB, VALPROATE, CBMZ   .res Assessment: Plan:    [name] disorder ([place])  Panic disorder with agoraphobia  Generalized anxiety  disorder  Mild cognitive impairment  Insomnia due to mental condition  Claustrophobia  Benign paroxysmal positional vertigo due to bilateral vestibular disorder   Thayer Headings has chronic severe anxiety with panic attacks as well as bipolar disorder with a history of significant lability.  Overall mood lability has been improved with the current med med regimen.  She remains chronically anxious.  She also has mild cognitive impairment as a complicating factor.  She needs her husband's assistance to manage her medications.   Mixed manic sx resolved for now. No  med changes today. Continue lorazepam, lamotrigine, quetiapine, buspirone. Can't reduce lorazepam any further but she's aware it might affect her memory.  Discussed potential metabolic side effects associated with atypical antipsychotics, as well as potential risk for movement side effects. Advised pt to contact office if movement side effects occur.   We discussed the short-term risks associated with benzodiazepines including sedation and increased fall risk among others.  Discussed long-term side effect risk including dependence, potential withdrawal symptoms, and the potential eventual dose-related risk of dementia.  Use of lorazepam remains medically necessary to manage her panic disorder. Disc risk combo with diazepam but she rarely takes it.  Also combo with meclizine at times but not current.  Pt is chronically needy and requires extended appts DT chronic anxiety and easily stressed.  Supportive and cognitive work are done with the patient on her excessive guilt and now stress of vertigo.  Cannot drive now and wasn't much before.  Also stressed  By [name] and Yolanda and how to deal with Yolanda's school refusal.  For example taking on responsibility for things with her granddaughter that she can fact not control.  Also addressing her fears related to her husband's diagnosis of cancer. Supportive therapy dealing with chronic stressors and difficulty  with daughter.   Pt doesn't feel able to fix her meds by herself.  No med changes indicated.  this appt was 35 mins.  FU 3-4 mos  [name], MD, DFAPA   Please see After Visit Summary for patient specific instructions.  No future appointments.  No orders of the defined types were placed in this encounter.     -------------------------------

## 2020-11-18 DIAGNOSIS — E7800 Pure hypercholesterolemia, unspecified: Secondary | ICD-10-CM | POA: Diagnosis not present

## 2020-11-18 DIAGNOSIS — J45909 Unspecified asthma, uncomplicated: Secondary | ICD-10-CM | POA: Diagnosis not present

## 2020-11-18 DIAGNOSIS — N183 Chronic kidney disease, stage 3 unspecified: Secondary | ICD-10-CM | POA: Diagnosis not present

## 2020-11-18 DIAGNOSIS — I10 Essential (primary) hypertension: Secondary | ICD-10-CM | POA: Diagnosis not present

## 2020-11-18 DIAGNOSIS — K219 Gastro-esophageal reflux disease without esophagitis: Secondary | ICD-10-CM | POA: Diagnosis not present

## 2020-11-18 DIAGNOSIS — F319 Bipolar disorder, unspecified: Secondary | ICD-10-CM | POA: Diagnosis not present

## 2020-11-18 DIAGNOSIS — I129 Hypertensive chronic kidney disease with stage 1 through stage 4 chronic kidney disease, or unspecified chronic kidney disease: Secondary | ICD-10-CM | POA: Diagnosis not present

## 2020-11-18 DIAGNOSIS — E039 Hypothyroidism, unspecified: Secondary | ICD-10-CM | POA: Diagnosis not present

## 2020-11-18 DIAGNOSIS — E1129 Type 2 diabetes mellitus with other diabetic kidney complication: Secondary | ICD-10-CM | POA: Diagnosis not present

## 2020-12-10 DIAGNOSIS — M79604 Pain in right leg: Secondary | ICD-10-CM | POA: Diagnosis not present

## 2020-12-16 DIAGNOSIS — M79605 Pain in left leg: Secondary | ICD-10-CM | POA: Diagnosis not present

## 2020-12-17 DIAGNOSIS — G4733 Obstructive sleep apnea (adult) (pediatric): Secondary | ICD-10-CM | POA: Diagnosis not present

## 2020-12-20 ENCOUNTER — Emergency Department (HOSPITAL_COMMUNITY): Payer: Medicare HMO

## 2020-12-20 ENCOUNTER — Encounter (HOSPITAL_COMMUNITY): Payer: Self-pay | Admitting: Emergency Medicine

## 2020-12-20 ENCOUNTER — Emergency Department (HOSPITAL_COMMUNITY)
Admission: EM | Admit: 2020-12-20 | Discharge: 2020-12-20 | Disposition: A | Discharge status: Home / Self Care | Payer: Medicare HMO | Attending: Emergency Medicine | Admitting: Emergency Medicine

## 2020-12-20 ENCOUNTER — Other Ambulatory Visit: Payer: Self-pay

## 2020-12-20 DIAGNOSIS — E119 Type 2 diabetes mellitus without complications: Secondary | ICD-10-CM | POA: Diagnosis not present

## 2020-12-20 DIAGNOSIS — R4182 Altered mental status, unspecified: Secondary | ICD-10-CM | POA: Diagnosis not present

## 2020-12-20 DIAGNOSIS — R251 Tremor, unspecified: Secondary | ICD-10-CM | POA: Diagnosis not present

## 2020-12-20 DIAGNOSIS — I509 Heart failure, unspecified: Secondary | ICD-10-CM | POA: Diagnosis not present

## 2020-12-20 DIAGNOSIS — M1712 Unilateral primary osteoarthritis, left knee: Secondary | ICD-10-CM | POA: Diagnosis not present

## 2020-12-20 DIAGNOSIS — Z7982 Long term (current) use of aspirin: Secondary | ICD-10-CM | POA: Diagnosis not present

## 2020-12-20 DIAGNOSIS — R410 Disorientation, unspecified: Secondary | ICD-10-CM | POA: Diagnosis not present

## 2020-12-20 DIAGNOSIS — Z79899 Other long term (current) drug therapy: Secondary | ICD-10-CM | POA: Diagnosis not present

## 2020-12-20 DIAGNOSIS — Z7984 Long term (current) use of oral hypoglycemic drugs: Secondary | ICD-10-CM | POA: Diagnosis not present

## 2020-12-20 DIAGNOSIS — I110 Hypertensive heart disease with heart failure: Secondary | ICD-10-CM | POA: Insufficient documentation

## 2020-12-20 DIAGNOSIS — J45909 Unspecified asthma, uncomplicated: Secondary | ICD-10-CM | POA: Insufficient documentation

## 2020-12-20 DIAGNOSIS — R531 Weakness: Secondary | ICD-10-CM | POA: Diagnosis not present

## 2020-12-20 DIAGNOSIS — I10 Essential (primary) hypertension: Secondary | ICD-10-CM | POA: Diagnosis not present

## 2020-12-20 DIAGNOSIS — E039 Hypothyroidism, unspecified: Secondary | ICD-10-CM | POA: Diagnosis not present

## 2020-12-20 LAB — CBG MONITORING, ED: Glucose-Capillary: 165 mg/dL — ABNORMAL HIGH (ref 70–99)

## 2020-12-20 MED ORDER — ONDANSETRON 8 MG PO TBDP
8.0000 mg | ORAL_TABLET | Freq: Once | ORAL | Status: AC
  Administered 2020-12-20: 8 mg via ORAL
  Filled 2020-12-20: qty 1

## 2020-12-20 MED ORDER — METOPROLOL SUCCINATE ER 50 MG PO TB24
50.0000 mg | ORAL_TABLET | Freq: Every day | ORAL | Status: DC
  Administered 2020-12-20: 50 mg via ORAL
  Filled 2020-12-20: qty 1

## 2020-12-20 MED ORDER — LISINOPRIL 20 MG PO TABS
20.0000 mg | ORAL_TABLET | Freq: Every day | ORAL | Status: DC
  Administered 2020-12-20: 20 mg via ORAL
  Filled 2020-12-20: qty 1

## 2020-12-20 NOTE — ED Triage Notes (Addendum)
Patient's husband reports tremors/shaking which started yesterday. He reports she has been "shaking everywhere". He reports she recently had a fall and was prescribed Tylenol and Tramadol 50 mg PRN for knee pain. She reports taking Tramadol at 6 AM this morning.The patient believes she is having a medication reaction to the tramadol. Patient is extremely tearful and states, "I just don't feel right." Husband reports she has a hx of bipolar disorder.

## 2020-12-20 NOTE — ED Provider Notes (Signed)
[facility] DEPT Provider Note   CSN: [id number] Arrival date & time: 12/20/20  0824     History Chief Complaint  Patient presents with  . Tremors    [name] is a 70 y.o. female.  70 year old female presents with several month history of confusion.  Patient has not seen her doctor for this.  Recently she started taking tramadol for a knee injury that she sustained 3 weeks ago.  That was her left knee and has not been imaged.  Currently, she denies any somatic complaints such as fever, cough or congestion.  Patient does have a history of bipolar disorder.  This morning she had tremors and began to shake.  Husband felt this was anxiety.  Presents for further evaluation        Past Medical History:  Diagnosis Date  . Anxiety   . Arthritis   . Asthma   . Bipolar 1 disorder ([place])   . CHF (congestive heart failure) ([place])   . Complication of anesthesia    left a bad taste in my mouth it has lasted since january   . Depression   . Diabetes mellitus   . Hernia    umbilical  . Hyperlipidemia   . Hypertension   . Hypothyroidism   . Pericarditis, viral 2010 October  . Sleep apnea    uses cpap setting of 15    Patient Active Problem List   Diagnosis Date Noted  . [name] disorder ([place]) 09/19/2018  . GAD (generalized anxiety disorder) 09/19/2018  . Panic 09/19/2018  . GERD (gastroesophageal reflux disease) 05/20/2014  . Abdominal pain, generalized 05/20/2014  . Nausea and vomiting 11/15/2012  . Diarrhea 11/15/2012  . OSA on CPAP 11/15/2012  . Bipolar 1 disorder, depressed ([place]) 11/15/2012  . Incisional hernia without mention of obstruction or gangrene 07/19/2011  . DYSPNEA 10/25/2010  . HYPERLIPIDEMIA-MIXED 10/22/2010  . OBESITY-MORBID (>100') 10/22/2010  . HYPERTENSION, UNSPECIFIED 10/22/2010  . Atrial fibrillation ([place]) 10/22/2010  . CHEST PAIN-UNSPECIFIED 10/22/2010    Past Surgical History:  Procedure Laterality Date  .  ABDOMINAL HYSTERECTOMY    . APPENDECTOMY    . CHOLECYSTECTOMY  1984   open  . COLONOSCOPY WITH PROPOFOL N/A 05/20/2014   Procedure: COLONOSCOPY WITH PROPOFOL;  Surgeon: Lear Ng, MD;  Location: [facility];  Service: Endoscopy;  Laterality: N/A;  . ESOPHAGOGASTRODUODENOSCOPY (EGD) WITH PROPOFOL N/A 05/20/2014   Procedure: ESOPHAGOGASTRODUODENOSCOPY (EGD) WITH PROPOFOL;  Surgeon: Lear Ng, MD;  Location: [facility];  Service: Endoscopy;  Laterality: N/A;  . FOOT SURGERY     bone spurs   . KNEE ARTHROSCOPY  09/07/2012   Procedure: ARTHROSCOPY KNEE;  Surgeon: Newt Minion, MD;  Location: [facility];  Service: Orthopedics;  Laterality: Left;  Left Knee Arthroscopy     OB History    Gravida  2   Para  2   Term      Preterm      AB      Living  2     SAB      IAB      Ectopic      Multiple      Live Births              Family History  Problem Relation Age of Onset  . Diabetes Mother   . Hypertension Mother   . Hyperlipidemia Mother   . Diabetes Father   . Hypertension Father   . Hyperlipidemia Father   .  Hypertension Sister     Social History   Tobacco Use  . Smoking status: Never Smoker  . Smokeless tobacco: Never Used  Substance Use Topics  . Alcohol use: No  . Drug use: No    Home Medications Prior to Admission medications   Medication Sig Start Date End Date Taking? Authorizing Provider  amLODipine (NORVASC) 5 MG tablet  01/28/19   [provider]  aspirin 81 MG tablet Take 81 mg by mouth daily.    [provider]  busPIRone (BUSPAR) 30 MG tablet Take 1 tablet (30 mg total) by mouth 2 (two) times daily. 08/28/20   Cottle, Billey Co., MD  cefpodoxime (VANTIN) 100 MG tablet Take 100 mg by mouth 2 (two) times daily. 06/21/18   [provider]  cephALEXin (KEFLEX) 250 MG capsule Take 250 mg by mouth at bedtime. 09/28/19   [provider]  diazepam (VALIUM) 5 MG tablet Take 1 tablet (5 mg total) by mouth  every 12 (twelve) hours as needed (dizziness). 07/30/20   Isla Pence, MD  diclofenac sodium (VOLTAREN) 1 % GEL Apply 2 g topically 4 (four) times daily. Rub into affected area of foot 2 to 4 times daily 06/03/19   Trula Slade, DPM  esomeprazole (NEXIUM) 40 MG capsule Take 1 capsule (40 mg total) by mouth daily. 02/16/14   Elyn Peers, MD  etodolac (LODINE) 400 MG tablet Take 400 mg by mouth 2 (two) times daily. 12/01/13   [provider]  furosemide (LASIX) 20 MG tablet Take 20 mg by mouth daily as needed (for severe swelling).    [provider]  glimepiride (AMARYL) 1 MG tablet Take 1 mg by mouth daily. 07/27/20   [provider]  lamoTRIgine (LAMICTAL) 150 MG tablet Take 1 tablet (150 mg total) by mouth 2 (two) times daily. 08/28/20   Cottle, Billey Co., MD  lansoprazole (PREVACID) 15 MG capsule Take 15 mg by mouth daily. 08/27/19   [provider]  levocetirizine (XYZAL) 5 MG tablet Take 5 mg by mouth every evening.    [provider]  levothyroxine (SYNTHROID) 100 MCG tablet Take 100 mcg by mouth daily. 07/27/20   [provider]  levothyroxine (SYNTHROID) 88 MCG tablet TAKE 1 TABLET BY MOUTH EVERY MORNING ON AN EMPTY STOMACH Patient not taking: Reported on 11/17/2020 01/16/19   [provider]  lisinopril (PRINIVIL,ZESTRIL) 20 MG tablet Take 20 mg by mouth daily.    [provider]  LORazepam (ATIVAN) 0.5 MG tablet 1 in the AM and 2 at night 08/28/20   Cottle, Billey Co., MD  meclizine (ANTIVERT) 25 MG tablet Take 1 tablet (25 mg total) by mouth 3 (three) times daily as needed for dizziness. 06/12/20   Dorie Rank, MD  meloxicam (MOBIC) 15 MG tablet Take 15 mg by mouth daily. 08/27/19   [provider]  Methylfol-Methylcob-Acetylcyst (L-METHYL-MC NAC) 6-2-600 MG TABS TK 1 T PO QD 02/12/18   [provider]  metoprolol succinate (TOPROL-XL) 50 MG 24 hr tablet Take 50 mg by mouth daily. Take with or  immediately following a meal.    [provider]  phenazopyridine (PYRIDIUM) 200 MG tablet Take 200 mg by mouth 3 (three) times daily as needed for pain.  12/10/15   [provider]  polyethylene glycol (MIRALAX / GLYCOLAX) packet Take 17 g by mouth 2 (two) times daily as needed for mild constipation.    [provider]  potassium chloride SA (K-DUR,KLOR-CON) 20  MEQ tablet Take 20 mEq by mouth daily.    [provider]  QUEtiapine (SEROQUEL XR) 400 MG 24 hr tablet Take 2 tablets (800 mg total) by mouth at bedtime. 08/28/20   Cottle, Billey Co., MD  QUEtiapine (SEROQUEL) 200 MG tablet Take 1 tablet (200 mg total) by mouth at bedtime as needed. for sleep Patient not taking: Reported on 11/17/2020 08/28/20   Purnell Shoemaker., MD  simvastatin (ZOCOR) 40 MG tablet Take 40 mg by mouth daily.    [provider]  Vitamin D, Ergocalciferol, (DRISDOL) 1.25 MG (50000 UNIT) CAPS capsule TAKE 1 CAPSULE (50,000  UNITS TOTAL) BY MOUTH TWICE WEEKLY 02/11/20   Cottle, Billey Co., MD    Allergies    Celexa [citalopram hydrobromide], Maxzide [triamterene-hctz], Metformin and related, Other, Reglan [metoclopramide], Risperdal [risperidone], Trazodone and nefazodone, Zestril [lisinopril], and Lithium  Review of Systems   Review of Systems  All other systems reviewed and are negative.   Physical Exam Updated Vital Signs BP (!) 164/132 (BP Location: Right Arm)   Pulse 84   Temp 98.6 F (37 C) (Oral)   Resp 16   SpO2 99%   Physical Exam Vitals and nursing note reviewed.  Constitutional:      General: She is not in acute distress.    Appearance: Normal appearance. She is well-developed. She is not toxic-appearing.  HENT:     Head: Normocephalic and atraumatic.  Eyes:     General: Lids are normal.     Conjunctiva/sclera: Conjunctivae normal.     Pupils: Pupils are equal, round, and reactive to light.  Neck:     Thyroid: No thyroid mass.     Trachea: No  tracheal deviation.  Cardiovascular:     Rate and Rhythm: Normal rate and regular rhythm.     Heart sounds: Normal heart sounds. No murmur heard. No gallop.   Pulmonary:     Effort: Pulmonary effort is normal. No respiratory distress.     Breath sounds: Normal breath sounds. No stridor. No decreased breath sounds, wheezing, rhonchi or rales.  Abdominal:     General: Bowel sounds are normal. There is no distension.     Palpations: Abdomen is soft.     Tenderness: There is no abdominal tenderness. There is no rebound.  Musculoskeletal:        General: No tenderness. Normal range of motion.     Cervical back: Normal range of motion and neck supple.  Skin:    General: Skin is warm and dry.     Findings: No abrasion or rash.  Neurological:     General: No focal deficit present.     Mental Status: She is alert and oriented to person, place, and time.     GCS: GCS eye subscore is 4. GCS verbal subscore is 5. GCS motor subscore is 6.     Cranial Nerves: Cranial nerves are intact. No cranial nerve deficit.     Sensory: No sensory deficit.     Motor: Motor function is intact.     Coordination: Coordination is intact.  Psychiatric:        Attention and Perception: Attention normal.        Mood and Affect: Mood normal.        Speech: Speech normal.        Behavior: Behavior normal.     ED Results / Procedures / Treatments   Labs (all labs ordered are listed, but only abnormal results are displayed)  Labs Reviewed  CBG MONITORING, ED - Abnormal; Notable for the following components:      Result Value   Glucose-Capillary 165 (*)    All other components within normal limits    EKG None  Radiology No results found.  Procedures Procedures   Medications Ordered in ED Medications - No data to display  ED Course  I have reviewed the triage vital signs and the nursing notes.  Pertinent labs & imaging results that were available during my care of the patient were reviewed by me  and considered in my medical decision making (see chart for details).    MDM Rules/Calculators/A&P                         CT of head negative for acute findings.  Left knee x-ray without acute findings as well 2.  Has nausea that was treated with Zofran.  Patient will follow-up with her doctor  Final Clinical Impression(s) / ED Diagnoses Final diagnoses:  None    Rx / DC Orders ED Discharge Orders    None       Lacretia Leigh, MD 12/20/20 1026

## 2020-12-29 DIAGNOSIS — N183 Chronic kidney disease, stage 3 unspecified: Secondary | ICD-10-CM | POA: Diagnosis not present

## 2020-12-29 DIAGNOSIS — F319 Bipolar disorder, unspecified: Secondary | ICD-10-CM | POA: Diagnosis not present

## 2020-12-29 DIAGNOSIS — I10 Essential (primary) hypertension: Secondary | ICD-10-CM | POA: Diagnosis not present

## 2020-12-29 DIAGNOSIS — K219 Gastro-esophageal reflux disease without esophagitis: Secondary | ICD-10-CM | POA: Diagnosis not present

## 2020-12-29 DIAGNOSIS — I129 Hypertensive chronic kidney disease with stage 1 through stage 4 chronic kidney disease, or unspecified chronic kidney disease: Secondary | ICD-10-CM | POA: Diagnosis not present

## 2020-12-29 DIAGNOSIS — J45909 Unspecified asthma, uncomplicated: Secondary | ICD-10-CM | POA: Diagnosis not present

## 2020-12-29 DIAGNOSIS — E1129 Type 2 diabetes mellitus with other diabetic kidney complication: Secondary | ICD-10-CM | POA: Diagnosis not present

## 2020-12-29 DIAGNOSIS — E7800 Pure hypercholesterolemia, unspecified: Secondary | ICD-10-CM | POA: Diagnosis not present

## 2020-12-29 DIAGNOSIS — E039 Hypothyroidism, unspecified: Secondary | ICD-10-CM | POA: Diagnosis not present

## 2021-01-27 DIAGNOSIS — I10 Essential (primary) hypertension: Secondary | ICD-10-CM | POA: Diagnosis not present

## 2021-01-27 DIAGNOSIS — K219 Gastro-esophageal reflux disease without esophagitis: Secondary | ICD-10-CM | POA: Diagnosis not present

## 2021-01-27 DIAGNOSIS — E039 Hypothyroidism, unspecified: Secondary | ICD-10-CM | POA: Diagnosis not present

## 2021-01-27 DIAGNOSIS — F319 Bipolar disorder, unspecified: Secondary | ICD-10-CM | POA: Diagnosis not present

## 2021-01-27 DIAGNOSIS — E7800 Pure hypercholesterolemia, unspecified: Secondary | ICD-10-CM | POA: Diagnosis not present

## 2021-01-27 DIAGNOSIS — G4730 Sleep apnea, unspecified: Secondary | ICD-10-CM | POA: Diagnosis not present

## 2021-01-27 DIAGNOSIS — E1129 Type 2 diabetes mellitus with other diabetic kidney complication: Secondary | ICD-10-CM | POA: Diagnosis not present

## 2021-01-27 DIAGNOSIS — Z0000 Encounter for general adult medical examination without abnormal findings: Secondary | ICD-10-CM | POA: Diagnosis not present

## 2021-01-27 DIAGNOSIS — Z7984 Long term (current) use of oral hypoglycemic drugs: Secondary | ICD-10-CM | POA: Diagnosis not present

## 2021-02-03 DIAGNOSIS — I129 Hypertensive chronic kidney disease with stage 1 through stage 4 chronic kidney disease, or unspecified chronic kidney disease: Secondary | ICD-10-CM | POA: Diagnosis not present

## 2021-02-03 DIAGNOSIS — J45909 Unspecified asthma, uncomplicated: Secondary | ICD-10-CM | POA: Diagnosis not present

## 2021-02-03 DIAGNOSIS — E039 Hypothyroidism, unspecified: Secondary | ICD-10-CM | POA: Diagnosis not present

## 2021-02-03 DIAGNOSIS — I10 Essential (primary) hypertension: Secondary | ICD-10-CM | POA: Diagnosis not present

## 2021-02-03 DIAGNOSIS — E1129 Type 2 diabetes mellitus with other diabetic kidney complication: Secondary | ICD-10-CM | POA: Diagnosis not present

## 2021-02-03 DIAGNOSIS — N183 Chronic kidney disease, stage 3 unspecified: Secondary | ICD-10-CM | POA: Diagnosis not present

## 2021-02-03 DIAGNOSIS — K219 Gastro-esophageal reflux disease without esophagitis: Secondary | ICD-10-CM | POA: Diagnosis not present

## 2021-02-03 DIAGNOSIS — F319 Bipolar disorder, unspecified: Secondary | ICD-10-CM | POA: Diagnosis not present

## 2021-02-03 DIAGNOSIS — E7800 Pure hypercholesterolemia, unspecified: Secondary | ICD-10-CM | POA: Diagnosis not present

## 2021-02-06 ENCOUNTER — Emergency Department (HOSPITAL_BASED_OUTPATIENT_CLINIC_OR_DEPARTMENT_OTHER)
Admission: EM | Admit: 2021-02-06 | Discharge: 2021-02-06 | Disposition: A | Discharge status: Home / Self Care | Payer: Medicare HMO | Attending: Emergency Medicine | Admitting: Emergency Medicine

## 2021-02-06 ENCOUNTER — Encounter (HOSPITAL_BASED_OUTPATIENT_CLINIC_OR_DEPARTMENT_OTHER): Payer: Self-pay

## 2021-02-06 ENCOUNTER — Emergency Department (HOSPITAL_BASED_OUTPATIENT_CLINIC_OR_DEPARTMENT_OTHER): Payer: Medicare HMO | Admitting: Radiology

## 2021-02-06 DIAGNOSIS — E119 Type 2 diabetes mellitus without complications: Secondary | ICD-10-CM | POA: Insufficient documentation

## 2021-02-06 DIAGNOSIS — Z79899 Other long term (current) drug therapy: Secondary | ICD-10-CM | POA: Insufficient documentation

## 2021-02-06 DIAGNOSIS — M545 Low back pain, unspecified: Secondary | ICD-10-CM | POA: Diagnosis not present

## 2021-02-06 DIAGNOSIS — E039 Hypothyroidism, unspecified: Secondary | ICD-10-CM | POA: Insufficient documentation

## 2021-02-06 DIAGNOSIS — Z7984 Long term (current) use of oral hypoglycemic drugs: Secondary | ICD-10-CM | POA: Insufficient documentation

## 2021-02-06 DIAGNOSIS — I509 Heart failure, unspecified: Secondary | ICD-10-CM | POA: Insufficient documentation

## 2021-02-06 DIAGNOSIS — S39012A Strain of muscle, fascia and tendon of lower back, initial encounter: Secondary | ICD-10-CM | POA: Insufficient documentation

## 2021-02-06 DIAGNOSIS — R6889 Other general symptoms and signs: Secondary | ICD-10-CM | POA: Diagnosis not present

## 2021-02-06 DIAGNOSIS — Z7982 Long term (current) use of aspirin: Secondary | ICD-10-CM | POA: Diagnosis not present

## 2021-02-06 DIAGNOSIS — J45909 Unspecified asthma, uncomplicated: Secondary | ICD-10-CM | POA: Insufficient documentation

## 2021-02-06 DIAGNOSIS — W19XXXA Unspecified fall, initial encounter: Secondary | ICD-10-CM | POA: Diagnosis not present

## 2021-02-06 DIAGNOSIS — I110 Hypertensive heart disease with heart failure: Secondary | ICD-10-CM | POA: Diagnosis not present

## 2021-02-06 DIAGNOSIS — M549 Dorsalgia, unspecified: Secondary | ICD-10-CM | POA: Diagnosis not present

## 2021-02-06 DIAGNOSIS — M47816 Spondylosis without myelopathy or radiculopathy, lumbar region: Secondary | ICD-10-CM | POA: Diagnosis not present

## 2021-02-06 DIAGNOSIS — S3992XA Unspecified injury of lower back, initial encounter: Secondary | ICD-10-CM | POA: Diagnosis present

## 2021-02-06 DIAGNOSIS — Z743 Need for continuous supervision: Secondary | ICD-10-CM | POA: Diagnosis not present

## 2021-02-06 DIAGNOSIS — R0902 Hypoxemia: Secondary | ICD-10-CM | POA: Diagnosis not present

## 2021-02-06 MED ORDER — HYDROCODONE-ACETAMINOPHEN 5-325 MG PO TABS
1.0000 | ORAL_TABLET | Freq: Once | ORAL | Status: AC
  Administered 2021-02-06: 16:00:00 1 via ORAL
  Filled 2021-02-06: qty 1

## 2021-02-06 MED ORDER — LIDOCAINE 5 % EX PTCH: 1.0000 | MEDICATED_PATCH | CUTANEOUS | 0 refills | Status: DC

## 2021-02-06 MED ORDER — LIDOCAINE 5 % EX PTCH
1.0000 | MEDICATED_PATCH | Freq: Once | CUTANEOUS | Status: DC
  Administered 2021-02-06: 17:00:00 1 via TRANSDERMAL
  Filled 2021-02-06: qty 1

## 2021-02-06 NOTE — Discharge Instructions (Addendum)
If you develop worsening, recurrent, or continued back pain, numbness or weakness in the legs, incontinence of your bowels or bladders, numbness of your buttocks, fever, abdominal pain, or any other new/concerning symptoms then return to the ER for evaluation.  

## 2021-02-06 NOTE — ED Provider Notes (Signed)
[facility] EMERGENC[facility] DEPT Provider Note   CSN: [id number] Arrival date & time: 02/06/21  1505     History Chief Complaint  Patient presents with   Back Pain    [name] is a 70 y.o. female.  HPI 70 year old female presents with acute low back pain.  She states that she injured it about 3 days ago when she was bending over to get a pot.  She has been having waxing and waning pain since.  It is in her bilateral low back.  Denies any fevers, abdominal pain, weakness or numbness in the extremities, incontinence or urinary symptoms.  It hurts worst when she goes from laying to standing or moving.  She has been able to get up with a walker, which she uses as needed.  Today the pain was much worse when she was trying to get up and walk. Has been taking tylenol in addition to her chronic medicines.   Past Medical History:  Diagnosis Date   Anxiety    Arthritis    Asthma    Bipolar 1 disorder ([place])    CHF (congestive heart failure) ([place])    Complication of anesthesia    left a bad taste in my mouth it has lasted since january    Depression    Diabetes mellitus    Hernia    umbilical   Hyperlipidemia    Hypertension    Hypothyroidism    Pericarditis, viral 2010 October   Sleep apnea    uses cpap setting of 15    Patient Active Problem List   Diagnosis Date Noted   [name] disorder ([place]) 09/19/2018   GAD (generalized anxiety disorder) 09/19/2018   Panic 09/19/2018   GERD (gastroesophageal reflux disease) 05/20/2014   Abdominal pain, generalized 05/20/2014   Nausea and vomiting 11/15/2012   Diarrhea 11/15/2012   OSA on CPAP 11/15/2012   Bipolar 1 disorder, depressed ([place]) 11/15/2012   Incisional hernia without mention of obstruction or gangrene 07/19/2011   DYSPNEA 10/25/2010   HYPERLIPIDEMIA-MIXED 10/22/2010   OBESITY-MORBID (>100') 10/22/2010   HYPERTENSION, UNSPECIFIED 10/22/2010   Atrial fibrillation ([place]) 10/22/2010   CHEST PAIN-UNSPECIFIED  10/22/2010    Past Surgical History:  Procedure Laterality Date   ABDOMINAL HYSTERECTOMY     APPENDECTOMY     CHOLECYSTECTOMY  1984   open   COLONOSCOPY WITH PROPOFOL N/A 05/20/2014   Procedure: COLONOSCOPY WITH PROPOFOL;  Surgeon: Lear Ng, MD;  Location: [facility];  Service: Endoscopy;  Laterality: N/A;   ESOPHAGOGASTRODUODENOSCOPY (EGD) WITH PROPOFOL N/A 05/20/2014   Procedure: ESOPHAGOGASTRODUODENOSCOPY (EGD) WITH PROPOFOL;  Surgeon: Lear Ng, MD;  Location: [facility];  Service: Endoscopy;  Laterality: N/A;   FOOT SURGERY     bone spurs    KNEE ARTHROSCOPY  09/07/2012   Procedure: ARTHROSCOPY KNEE;  Surgeon: Newt Minion, MD;  Location: [facility];  Service: Orthopedics;  Laterality: Left;  Left Knee Arthroscopy     OB History     Gravida  2   Para  2   Term      Preterm      AB      Living  2      SAB      IAB      Ectopic      Multiple      Live Births              Family History  Problem Relation Age of Onset   Diabetes Mother  Hypertension Mother    Hyperlipidemia Mother    Diabetes Father    Hypertension Father    Hyperlipidemia Father    Hypertension Sister     Social History   Tobacco Use   Smoking status: Never   Smokeless tobacco: Never  Substance Use Topics   Alcohol use: No   Drug use: No    Home Medications Prior to Admission medications   Medication Sig Start Date End Date Taking? Authorizing Provider  amLODipine (NORVASC) 5 MG tablet Take 5 mg by mouth daily. 01/28/19  Yes [provider]  aspirin 81 MG tablet Take 81 mg by mouth daily.   Yes [provider]  busPIRone (BUSPAR) 30 MG tablet Take 1 tablet (30 mg total) by mouth 2 (two) times daily. 08/28/20  Yes Cottle, Billey Co., MD  cephALEXin (KEFLEX) 250 MG capsule Take 250 mg by mouth at bedtime. 09/28/19  Yes [provider]  furosemide (LASIX) 20 MG tablet Take 20 mg by mouth daily as needed (for severe swelling).   Yes  [provider]  glimepiride (AMARYL) 1 MG tablet Take 1 mg by mouth daily. 07/27/20  Yes [provider]  lamoTRIgine (LAMICTAL) 150 MG tablet Take 1 tablet (150 mg total) by mouth 2 (two) times daily. 08/28/20  Yes Cottle, Billey Co., MD  lansoprazole (PREVACID) 15 MG capsule Take 15 mg by mouth daily. 08/27/19  Yes [provider]  levocetirizine (XYZAL) 5 MG tablet Take 5 mg by mouth every evening.   Yes [provider]  levothyroxine (SYNTHROID) 100 MCG tablet Take 100 mcg by mouth daily. 07/27/20  Yes [provider]  lidocaine (LIDODERM) 5 % Place 1 patch onto the skin daily. Remove & Discard patch within 12 hours or as directed by MD 02/06/21  Yes Sherwood Gambler, MD  lisinopril (PRINIVIL,ZESTRIL) 20 MG tablet Take 20 mg by mouth daily.   Yes [provider]  meclizine (ANTIVERT) 25 MG tablet Take 1 tablet (25 mg total) by mouth 3 (three) times daily as needed for dizziness. 06/12/20  Yes Dorie Rank, MD  meloxicam (MOBIC) 15 MG tablet Take 15 mg by mouth daily. 08/27/19  Yes [provider]  metoprolol succinate (TOPROL-XL) 50 MG 24 hr tablet Take 50 mg by mouth daily. Take with or immediately following a meal.   Yes [provider]  phenazopyridine (PYRIDIUM) 200 MG tablet Take 200 mg by mouth 3 (three) times daily as needed for pain.  12/10/15  Yes [provider]  polyethylene glycol (MIRALAX / GLYCOLAX) packet Take 17 g by mouth 2 (two) times daily as needed for mild constipation.   Yes [provider]  potassium chloride SA (K-DUR,KLOR-CON) 20 MEQ tablet Take 20 mEq by mouth daily.   Yes [provider]  QUEtiapine (SEROQUEL XR) 400 MG 24 hr tablet Take 2 tablets (800 mg total) by mouth at bedtime. 08/28/20  Yes Cottle, Billey Co., MD  QUEtiapine (SEROQUEL) 200 MG tablet Take 1 tablet (200 mg total) by mouth at bedtime as needed. for sleep 08/28/20  Yes Cottle, Billey Co., MD  simvastatin (ZOCOR) 40  MG tablet Take 40 mg by mouth daily.   Yes [provider]  Vitamin D, Ergocalciferol, (DRISDOL) 1.25 MG (50000 UNIT) CAPS capsule TAKE 1 CAPSULE (50,000  UNITS TOTAL) BY MOUTH TWICE WEEKLY Patient taking differently: Take 50,000 Units by mouth 2 (two) times a week. 02/11/20  Yes Cottle, Billey Co., MD  diazepam (VALIUM) 5  MG tablet Take 1 tablet (5 mg total) by mouth every 12 (twelve) hours as needed (dizziness). 07/30/20   Isla Pence, MD  diclofenac sodium (VOLTAREN) 1 % GEL Apply 2 g topically 4 (four) times daily. Rub into affected area of foot 2 to 4 times daily 06/03/19   Trula Slade, DPM  esomeprazole (NEXIUM) 40 MG capsule Take 1 capsule (40 mg total) by mouth daily. Patient not taking: No sig reported 02/16/14   Elyn Peers, MD  levothyroxine (SYNTHROID) 88 MCG tablet TAKE 1 TABLET BY MOUTH EVERY MORNING ON AN EMPTY STOMACH Patient not taking: No sig reported 01/16/19   [provider]  LORazepam (ATIVAN) 0.5 MG tablet 1 in the AM and 2 at night Patient taking differently: Take 0.5-1 mg by mouth 2 (two) times daily. 1 in the AM and 2 at night 08/28/20   Cottle, Billey Co., MD  traMADol (ULTRAM) 50 MG tablet Take 50 mg by mouth daily as needed for pain. 12/16/20   [provider]    Allergies    Celexa [citalopram hydrobromide], Maxzide [triamterene-hctz], Metformin and related, Other, Reglan [metoclopramide], Risperdal [risperidone], Trazodone and nefazodone, Zestril [lisinopril], and Lithium  Review of Systems   Review of Systems  Constitutional:  Negative for fever.  Gastrointestinal:  Negative for abdominal pain.  Genitourinary:  Negative for dysuria.       No incontinence  Musculoskeletal:  Positive for back pain.  Neurological:  Negative for weakness and numbness.  All other systems reviewed and are negative.  Physical Exam Updated Vital Signs BP (!) 135/91   Pulse 66   Temp 98.2 F (36.8 C) (Oral)   Resp 20   SpO2 100%   Physical  Exam Vitals and nursing note reviewed.  Constitutional:      General: She is not in acute distress.    Appearance: She is well-developed. She is obese.  HENT:     Head: Normocephalic and atraumatic.     Right Ear: External ear normal.     Left Ear: External ear normal.     Nose: Nose normal.  Eyes:     General:        Right eye: No discharge.        Left eye: No discharge.  Cardiovascular:     Rate and Rhythm: Normal rate and regular rhythm.     Pulses:          Dorsalis pedis pulses are 2+ on the right side and 2+ on the left side.     Heart sounds: Normal heart sounds.  Pulmonary:     Effort: Pulmonary effort is normal.     Breath sounds: Normal breath sounds.  Abdominal:     Palpations: Abdomen is soft.     Tenderness: There is no abdominal tenderness.  Musculoskeletal:     Cervical back: No tenderness.     Thoracic back: No tenderness.     Lumbar back: Tenderness (mild, hard to localize) present. No bony tenderness.     Right hip: No tenderness. Normal range of motion.     Left hip: No tenderness. Normal range of motion.  Skin:    General: Skin is warm and dry.  Neurological:     Mental Status: She is alert.     Comments: 5/5 strength in BLE. Grossly normal sensation  Psychiatric:        Mood and Affect: Mood is not anxious.    ED Results / Procedures / Treatments   Labs (  all labs ordered are listed, but only abnormal results are displayed) Labs Reviewed - No data to display  EKG None  Radiology DG Lumbar Spine Complete  Result Date: 02/06/2021 CLINICAL DATA:  Low back pain after fall 4 days ago EXAM: LUMBAR SPINE - COMPLETE 4+ VIEW COMPARISON:  03/23/2016 FINDINGS: Five lumbar type vertebral segments. Vertebral body heights and alignment are maintained. No fracture identified. Multilevel intervertebral disc space loss with associated degenerative endplate changes. Lower lumbar facet arthrosis. Abdominal aortic atherosclerosis. IMPRESSION: 1. No acute fracture  or static subluxation of the lumbar spine. 2. Moderate multilevel lumbar spondylosis. Electronically Signed   By: Davina Poke D.O.   On: 02/06/2021 16:31    Procedures Procedures   Medications Ordered in ED Medications  lidocaine (LIDODERM) 5 % 1 patch (has no administration in time range)  HYDROcodone-acetaminophen (NORCO/VICODIN) 5-325 MG per tablet 1 tablet (1 tablet Oral Given 02/06/21 1546)    ED Course  I have reviewed the triage vital signs and the nursing notes.  Pertinent labs & imaging results that were available during my care of the patient were reviewed by me and considered in my medical decision making (see chart for details).    MDM Rules/Calculators/A&P                          Presentation is most consistent with a lumbar sacral strain.  She is feeling better in the emergency department.  X-ray is unremarkable.  She was able to get up and walk.  My suspicion is she has a muscle problem rather than a spinal cord problem/emergency or retroperitoneal/abdominal emergency.  No infectious symptoms.  At this point think she is stable for discharge home.  We discussed topical treatments given her complex medication history and I do not think muscle relaxers would be beneficial as it is more likely to cause harm.  Discharge home with return precautions Final Clinical Impression(s) / ED Diagnoses Final diagnoses:  Strain of lumbar region, initial encounter    Rx / DC Orders ED Discharge Orders          Ordered    lidocaine (LIDODERM) 5 %  Every 24 hours        02/06/21 1644             Sherwood Gambler, MD 02/06/21 1707

## 2021-02-06 NOTE — ED Notes (Signed)
Patient ambulated with assistance.  Able to get off bed and take a few steps with assistance.  States she feels better than she did when she came in.

## 2021-02-06 NOTE — ED Triage Notes (Signed)
Her husband told EMS that pt. [place] about 4 days ago and pt. Has c/o low back pain ever since.

## 2021-02-06 NOTE — ED Notes (Signed)
Pt unable to ambulate. 

## 2021-02-06 NOTE — ED Notes (Signed)
Patient transported to X-ray 

## 2021-02-09 DIAGNOSIS — R296 Repeated falls: Secondary | ICD-10-CM | POA: Diagnosis not present

## 2021-02-09 DIAGNOSIS — M545 Low back pain, unspecified: Secondary | ICD-10-CM | POA: Diagnosis not present

## 2021-02-17 ENCOUNTER — Ambulatory Visit (INDEPENDENT_AMBULATORY_CARE_PROVIDER_SITE_OTHER): Payer: Medicare HMO | Admitting: Psychiatry

## 2021-02-17 ENCOUNTER — Telehealth: Payer: Self-pay | Admitting: Psychiatry

## 2021-02-17 ENCOUNTER — Other Ambulatory Visit: Payer: Self-pay

## 2021-02-17 ENCOUNTER — Encounter: Payer: Self-pay | Admitting: Psychiatry

## 2021-02-17 DIAGNOSIS — F411 Generalized anxiety disorder: Secondary | ICD-10-CM

## 2021-02-17 DIAGNOSIS — F4001 Agoraphobia with panic disorder: Secondary | ICD-10-CM

## 2021-02-17 DIAGNOSIS — F3181 Bipolar II disorder: Secondary | ICD-10-CM

## 2021-02-17 DIAGNOSIS — G3184 Mild cognitive impairment, so stated: Secondary | ICD-10-CM | POA: Diagnosis not present

## 2021-02-17 DIAGNOSIS — F40240 Claustrophobia: Secondary | ICD-10-CM

## 2021-02-17 DIAGNOSIS — F5105 Insomnia due to other mental disorder: Secondary | ICD-10-CM | POA: Diagnosis not present

## 2021-02-17 DIAGNOSIS — R7989 Other specified abnormal findings of blood chemistry: Secondary | ICD-10-CM

## 2021-02-17 NOTE — Progress Notes (Signed)
[name] [id number] January 27, 1951 70 y.o.    Subjective:   Patient ID:  [name] is a 70 y.o. (DOB 1950-12-05) female.  Chief Complaint:  Chief Complaint  Patient presents with   Follow-up   [name] disorder ([place])   Anxiety   Stress   Panic Attack   Depression    Anxiety Symptoms include confusion, dizziness and nervous/anxious behavior. Patient reports no chest pain, decreased concentration or suicidal ideas.       [name] presents to the office today for follow-up of anxiety and depression and poor cognition.  At her visit October 12, 2018.  Because of her poor cognition which did seem to get even worse lately with her memory and given a negative unremarkable medical work-up by her primary care doctor the decision was made to change her from Xanax which she has been on for years to lorazepam.  Lorazepam often has less cognitive side effects than does alprazolam.  She had a lot of difficulty with headaches and insomnia with the transition.  She called several times in that transition.  Her husband reported that her cognition was better after the transition however.  She was satisfied with the Ativan.  There was an attempt to reduce her lamotrigine to 150 mg also in hopes of improving cognition but it was uncertain at the last visit where there she had actually done so.  visit December 2020 without med changes.  She had continued to do cognitively better off alprazolam and on lorazepam.  seen March 2021.   Better than December visit.  Feeling good and less down.  Adjusting time of med helped excessively sleep.  Brief hypomanic episode resolved where she cleaned all night.  Sleep 10 hours . No med changes.  12/23/19 appt.  Noted: More depression, crying and racing thoughts and distressed to the point of wondering if needed the hospital.  Sleep got worse and needed Seroquel IR 200 also bc went 2 days without sleep.  It worked and helped sleep.  Worry a lot and  melancholy this week.  Cries over what happens dealing with her daugher.[name] has prostate and kidney cancer again.  Had surgery June 10, 2019 and she's cried a lot.  He's incontinent and very uncomfortable.  He usually has positive attitude.   Worry over his health and how she'll do if something happens to him.  She's afraid of Covid.  Trusting in God usually.  She realizes she  Needs to drive occasionally bc of his health.  [name]'s father had prostate CA and lived to 39 yo.  Uncle died of it.  Not markedly depressed. Can't walk much dT plantar fascitis. Also stressed over [name] 68 yo not doing school work and causing problems.  Her father [name] is a bad parent and not helpful.  Stressed over [name] and downs too and they have periodic conflict.   More down and anxious.   Guilt tendency.   Patient reports difficulty with sleep initiation or maintenance in part DT anxiety and racing thoughts.Intermittent sleep problems with days and nights mixed up.  Not napping. Not manic.[name] Denies appetite disturbance.  Patient reports that energy and motivation have been good.  Patient has some difficulty with concentration.  Chronic memory problems.   Patient denies any suicidal ideation. Plan:   No changes in meds indicated except agree to take the extra Seroquel 200 IR HS for mixed manic sx that are worse right now.  May be having mixed  seasonal sx.  02/20/2020 appointment with the following noted: Doing fairly good except chronic worry over [name] and [name].  Some days cries a lot over it.  [name] having a hard time lately.  Still easily stressed but not more than usual. Stopped extra quetiapine 200 mg HS bc no longer needed it. Anxiety is chronic but at baseline as is depression and not manic now. Yolanda helps her take the meds.   Memory is still bad but has been worse when on Xanax instead fo Ativan.  Sleep is fine from 10 to 9 which is much better than in the past.  Occ spells of staying up for 2 days and then needs to  take quetiapine.   [name] goes for FU soon for cancer check up. Plan: no med changes  05/21/20 appt with following noted: "A lot of problems".  Can't drive bc fear and anxiety.  Needed to drive Yolanda and couldn't. Fall off toilet twice. Dizzy and HA 2 days ago with a lot of crying and stress with daughter. Leaving home even coming here causes anxiety.    Cataract surgery twice. Fear of [name] being diagnosed with recurrent cancer. Chronic anxiety greather than depression.   Don't know what to do with daughter. Sleep, appetite and energy are OK. Tolerating meds. Sees Yolanda [name] one day weekly. Yolanda helps with meds bc she's forgetful and easily confused.  Loses train of thoughts. Dog has separation anxiety and so they don't go out much.  Plan: no med changes  08/18/20 appt with following noted: 2 ER visitis with vertigo.  RX diazepam and meclizine. Rarely takes former but takes latter regularly. Shakes so bad it made her cry and scream.  Started PT. Continues lorazepam low dose 0.5 mg AM and 1.0 mg HS.  Still has dizziness and vertigo. Memory is still bad and Yolanda administers meds with pill box. Chronic anxiety.  Depression manageable. Sleep OK and tolerates meds otherwise. [name] driving her crazy. Plan: no med changes  11/17/20 appt with following noted: Has had periods of vertigo and meds for it.  Then had manic sx with cleaning and couldn't sleep for 24 hours. Stopped quetiapine 200 and continued quetiapine XR 800. Also started diazepam 5 mg BID for vertigo and continued lorazepam for anxiety.  No meclizine now.  Vertigo stopped and manic sx stopped.  Tolerating meds now. Still feels [name] is mean to her too often and she will cry over it. Yolanda Ex[employer] [name] in jail and is the father of [name] 45 yo.  Will be there for 3 mos. Not markedly depressed.  But hates herself some days.  Still memory problems about the same.  Sleeping OK now.  Periods of mood swings. Easily anxious with relatives  visiting.   Plan: Mixed manic sx resolved for now. No med changes today.  02/17/2021 appointment with the following noted: Not good.  "A whole lof of everything."  Shaking for mos. It comes and goes.  Shaking at times makes her think she'll have a nervous breakdown.   Went to ER and dx vertigo.  Has tried meclizine and Valium but scared to take it.  Big shakes again today.  Memory is not good. Sleep to escape anxiety.  Scared to leave home. Not sure what brings on the spells of shaking. Today sx hit her as soon as she awoke.   Prior psychiatric medication trials include paroxetine, Lexapro,  risperidone 4 mg which was sedating, aripiprazole,  perphenazine,  Seroquel  1000, olanzapine for anxiety, and  lithium which was not tolerated even at very low dosages.  Topamax for anxiety, She was intolerant of both Namenda and Aricept.   Lamotrigine 300.   Xanax, Ativan, diazepam 5 mg BID   This is not an exhaustive list.   At the visit in February 2020 Matricia was more acutely confused recently for no clear reason.   She had a negative medical work-up for causes of short-term memory problems and confusion.  Therefore the decision was made to try switching her from alprazolam to lorazepam at the lowest possible dose in hopes of improving her cognition.  This was successful and her cognition was improved significantly and her husband verified this.     Review of Systems:  Review of Systems  Eyes:  Positive for visual disturbance.  Cardiovascular:  Negative for chest pain.  Musculoskeletal:  Positive for arthralgias, back pain and gait problem.  Neurological:  Positive for dizziness. Negative for tremors and weakness.  Psychiatric/Behavioral:  Positive for confusion. Negative for agitation, behavioral problems, decreased concentration, dysphoric mood, hallucinations, self-injury, sleep disturbance and suicidal ideas. The patient is nervous/anxious. The patient is not hyperactive.    Medications: I  have reviewed the patient's current medications.  Current Outpatient Medications  Medication Sig Dispense Refill   amLODipine (NORVASC) 5 MG tablet Take 5 mg by mouth daily.     aspirin 81 MG tablet Take 81 mg by mouth daily.     busPIRone (BUSPAR) 30 MG tablet Take 1 tablet (30 mg total) by mouth 2 (two) times daily. 180 tablet 3   cephALEXin (KEFLEX) 250 MG capsule Take 250 mg by mouth at bedtime.     diazepam (VALIUM) 5 MG tablet Take 1 tablet (5 mg total) by mouth every 12 (twelve) hours as needed (dizziness). 10 tablet 0   diclofenac sodium (VOLTAREN) 1 % GEL Apply 2 g topically 4 (four) times daily. Rub into affected area of foot 2 to 4 times daily 100 g 2   furosemide (LASIX) 20 MG tablet Take 20 mg by mouth daily as needed (for severe swelling).     glimepiride (AMARYL) 1 MG tablet Take 1 mg by mouth daily.     lamoTRIgine (LAMICTAL) 150 MG tablet Take 1 tablet (150 mg total) by mouth 2 (two) times daily. 180 tablet 3   levocetirizine (XYZAL) 5 MG tablet Take 5 mg by mouth every evening.     levothyroxine (SYNTHROID) 88 MCG tablet TAKE 1 TABLET BY MOUTH EVERY MORNING ON AN EMPTY STOMACH     lidocaine (LIDODERM) 5 % Place 1 patch onto the skin daily. Remove & Discard patch within 12 hours or as directed by MD 6 patch 0   lisinopril (PRINIVIL,ZESTRIL) 20 MG tablet Take 20 mg by mouth daily.     LORazepam (ATIVAN) 0.5 MG tablet 1 in the AM and 2 at night (Patient taking differently: Take 0.5-1 mg by mouth 2 (two) times daily. 1 in the AM and 2 at night) 270 tablet 1   meclizine (ANTIVERT) 25 MG tablet Take 1 tablet (25 mg total) by mouth 3 (three) times daily as needed for dizziness. 30 tablet 0   meloxicam (MOBIC) 15 MG tablet Take 15 mg by mouth daily.     metoprolol succinate (TOPROL-XL) 50 MG 24 hr tablet Take 50 mg by mouth daily. Take with or immediately following a meal.     phenazopyridine (PYRIDIUM) 200 MG tablet Take 200 mg by mouth 3 (three) times daily as  needed for pain.       polyethylene glycol (MIRALAX / GLYCOLAX) packet Take 17 g by mouth 2 (two) times daily as needed for mild constipation.     potassium chloride SA (K-DUR,KLOR-CON) 20 MEQ tablet Take 20 mEq by mouth daily.     QUEtiapine (SEROQUEL XR) 400 MG 24 hr tablet Take 2 tablets (800 mg total) by mouth at bedtime. 180 tablet 3   QUEtiapine (SEROQUEL) 200 MG tablet Take 1 tablet (200 mg total) by mouth at bedtime as needed. for sleep 90 tablet 3   simvastatin (ZOCOR) 40 MG tablet Take 40 mg by mouth daily.     Vitamin [name], Ergocalciferol, (DRISDOL) 1.25 MG (50000 UNIT) CAPS capsule TAKE 1 CAPSULE (50,000  UNITS TOTAL) BY MOUTH TWICE WEEKLY (Patient taking differently: Take 50,000 Units by mouth 2 (two) times a week.) 26 capsule 3   esomeprazole (NEXIUM) 40 MG capsule Take 1 capsule (40 mg total) by mouth daily. (Patient not taking: No sig reported) 30 capsule 0   lansoprazole (PREVACID) 15 MG capsule Take 15 mg by mouth daily.     traMADol (ULTRAM) 50 MG tablet Take 50 mg by mouth daily as needed for pain. (Patient not taking: Reported on 02/17/2021)     No current facility-administered medications for this visit.    Medication Side Effects: None  Allergies:  Allergies  Allergen Reactions   Celexa [Citalopram Hydrobromide] Nausea Only   Maxzide [Triamterene-Hctz]     Hurt patients kidneys   Metformin And Related Nausea And Vomiting   Other Nausea And Vomiting, Nausea Only and Other (See Comments)    Hurt patients kidneys   Reglan [Metoclopramide] Nausea Only   Risperdal [Risperidone] Nausea Only    Also causes hallucinations   Trazodone And Nefazodone Nausea Only   Zestril [Lisinopril] Nausea Only   Lithium Palpitations and Other (See Comments)    Shakes and delusional pt started seeing things      Past Medical History:  Diagnosis Date   Anxiety    Arthritis    Asthma    Bipolar 1 disorder ([facility])    CHF (congestive heart failure) ([facility])    Complication of anesthesia    left a bad taste in my  mouth it has lasted since january    Depression    Diabetes mellitus    Hernia    umbilical   Hyperlipidemia    Hypertension    Hypothyroidism    Pericarditis, viral 2010 October   Sleep apnea    uses cpap setting of 15    Family History  Problem Relation Age of Onset   Diabetes Mother    Hypertension Mother    Hyperlipidemia Mother    Diabetes Father    Hypertension Father    Hyperlipidemia Father    Hypertension Sister     Social History   Socioeconomic History   Marital status: Married    Spouse name: Not on file   Number of children: Not on file   Years of education: Not on file   Highest education level: Not on file  Occupational History   Not on file  Tobacco Use   Smoking status: Never   Smokeless tobacco: Never  Substance and Sexual Activity   Alcohol use: No   Drug use: No   Sexual activity: Not on file  Other Topics Concern   Not on file  Social History Narrative   Not on file   Social Determinants of Health   Financial Resource Strain:  Not on file  Food Insecurity: Not on file  Transportation Needs: Not on file  Physical Activity: Not on file  Stress: Not on file  Social Connections: Not on file  Intimate Partner Violence: Not on file    Past Medical History, Surgical history, Social history, and Family history were reviewed and updated as appropriate.   Please see review of systems for further details on the patient's review from today.   Objective:   Physical Exam:  There were no vitals taken for this visit.  Physical Exam Constitutional:      General: She is not in acute distress. Musculoskeletal:        General: No deformity.  Neurological:     Mental Status: She is alert and oriented to person, place, and time.     Cranial Nerves: No dysarthria.     Coordination: Coordination normal.  Psychiatric:        Attention and Perception: Attention and perception normal. She does not perceive auditory or visual hallucinations.         Mood and Affect: Mood is anxious. Mood is not depressed. Affect is not labile, blunt, angry, tearful or inappropriate.        Speech: Speech normal. Speech is not slurred.        Behavior: Behavior normal. Behavior is cooperative.        Thought Content: Thought content normal. Thought content is not paranoid or delusional. Thought content does not include homicidal or suicidal ideation. Thought content does not include homicidal or suicidal plan.        Cognition and Memory: Cognition is impaired. Memory is impaired.        Judgment: Judgment normal.     Comments: Insight fair and chronically stressed. Fair judgment. Anxiety is worse  Talkative chronically per usual.  Lab Review:     Component Value Date/Time   NA 140 07/30/2020 0351   K 4.0 07/30/2020 0351   CL 107 07/30/2020 0351   CO2 25 07/30/2020 0351   GLUCOSE 215 (Yolanda) 07/30/2020 0351   GLUCOSE 154 (Yolanda) 09/01/2006 1100   BUN 13 07/30/2020 0351   CREATININE 0.78 07/30/2020 0351   CREATININE 0.93 07/19/2011 1110   CALCIUM 10.2 07/30/2020 0351   PROT 6.8 07/30/2020 0351   ALBUMIN 3.7 07/30/2020 0351   AST 30 07/30/2020 0351   ALT 23 07/30/2020 0351   ALKPHOS 99 07/30/2020 0351   BILITOT 0.8 07/30/2020 0351   GFRNONAA >60 07/30/2020 0351   GFRAA >60 03/23/2016 1933       Component Value Date/Time   WBC 6.3 07/30/2020 0351   RBC 4.45 07/30/2020 0351   HGB 13.9 07/30/2020 0351   HCT 44.5 07/30/2020 0351   PLT 174 07/30/2020 0351   MCV 100.0 07/30/2020 0351   MCH 31.2 07/30/2020 0351   MCHC 31.2 07/30/2020 0351   RDW 13.3 07/30/2020 0351   LYMPHSABS 1.1 07/30/2020 0351   MONOABS 0.4 07/30/2020 0351   EOSABS 0.2 07/30/2020 0351   BASOSABS 0.1 07/30/2020 0351   Prior lamotrigine level in 2018 on this dosage was 3.6.  Not particularly high.  Per Dr. Marcos Eke: Clinical Impressions: Cognitive complaints are likely due to underlying psychiatric disorder (bipolar disorder/anxiety disorder with racing thoughts). Diagnostic  impressions based on test performances are limited due to the patient's severe inability to fully attend to the tasks. However, based on clinical presentation, I highly doubt the presence of a neurodegenerative dementia. It is much more likely that her racing thoughts and  psychiatric disorders are interfering with cognitive   No results found for: POCLITH, LITHIUM   No results found for: PHENYTOIN, PHENOBARB, VALPROATE, CBMZ   .res Assessment: Plan:    [name] disorder ([place])  Panic disorder with agoraphobia  Generalized anxiety disorder  Mild cognitive impairment  Insomnia due to mental condition  Claustrophobia  Low vitamin [name] level   Thayer Headings has chronic severe anxiety with panic attacks as well as bipolar disorder with a history of significant lability.  Overall mood lability has been improved with the current med med regimen.  She remains chronically anxious and that has been worse lately.  She also has mild cognitive impairment as a complicating factor.  She needs her husband's assistance to manage her medications.  She has  fallen several times and couldn't get up.   Mixed manic sx resolved for now. No med changes today. Continue lorazepam, lamotrigine, quetiapine, buspirone. Can't reduce lorazepam any further but she's aware it might affect her memory. Hesitate to increase Bz further. Only taken diazepam 3 times.    Consider switch to [place] bc lower SE and may help anxiety but probably would not be covered by insurance.  Try taking meclizine BID to try to prevent episodes of shaking and aanxiety.  Discussed potential metabolic side effects associated with atypical antipsychotics, as well as potential risk for movement side effects. Advised pt to contact office if movement side effects occur.   We discussed the short-term risks associated with benzodiazepines including sedation and increased fall risk among others.  Discussed long-term side effect risk including  dependence, potential withdrawal symptoms, and the potential eventual dose-related risk of dementia.  Use of lorazepam remains medically necessary to manage her panic disorder. Disc risk combo with diazepam but she rarely takes it.  Also combo with meclizine at times but not current.  Pt is chronically needy and requires extended appts DT chronic anxiety and easily stressed.  Supportive and cognitive work are done with the patient on her excessive guilt and now stress of vertigo.  Cannot drive now and wasn't much before.  Also stressed  By [name] and Yolanda and how to deal with Yolanda's school refusal.  For example taking on responsibility for things with her granddaughter that she can fact not control.  Also addressing her fears related to her husband's diagnosis of cancer. Supportive therapy dealing with chronic stressors and difficulty with daughter.   Pt doesn't feel able to fix her meds by herself. Disc her fears about going out and other phobias and disc behaviour therapy for I.t  Guarded prognosis.  this appt was 35 mins.  FU 2 mos  [name], MD, DFAPA   Please see After Visit Summary for patient specific instructions.  No future appointments.  No orders of the defined types were placed in this encounter.     -------------------------------

## 2021-02-17 NOTE — Telephone Encounter (Signed)
FYI

## 2021-02-17 NOTE — Telephone Encounter (Signed)
[name] just called and said that she thought she was having vertigo symptoms and feels like she is having a nervous breakdown. I then spoke to [place], spouse and he said she is safe as he is with her. She has an appt today at  2. He will be bringing her in to be seen.

## 2021-02-17 NOTE — Patient Instructions (Signed)
Try taking meclizine twice daily every day for 2-3 weeks and see if that will prevent the shakes and anxiety

## 2021-03-12 ENCOUNTER — Telehealth: Payer: Self-pay | Admitting: Psychiatry

## 2021-03-12 NOTE — Telephone Encounter (Signed)
Pt was tearful.Stated she is so scared and does not know what to do.Denied suicidal thoughts.She stated the meclizine does help her a little but her kids seem to think she is taking too much.She is having a very hard time coping with her husband leaving.

## 2021-03-12 NOTE — Telephone Encounter (Signed)
Pt called and said that she feels like she is not going to make it. Her husband walked out 3 days ago. Pt said that she has done nothing but cry all day. Pt said that she needs help. Can't handle what's going on. Please call.

## 2021-03-13 ENCOUNTER — Other Ambulatory Visit: Payer: Self-pay | Admitting: Psychiatry

## 2021-03-13 MED ORDER — RISPERIDONE 2 MG PO TABS: 2.0000 mg | ORAL_TABLET | Freq: Every day | ORAL | 0 refills | Status: DC

## 2021-03-13 NOTE — Telephone Encounter (Signed)
Tell her not to use meclizine for this.  Meclizine if for dizziness not anxiety.   H will probably calm down and return.  Reassure her to be patient. Use lorazepam as needed and prescribed. Add risperidone 2 mg 1 at night which will help anxiety Normally H prepares her meds.  Make sure she's taking correctly her meds.

## 2021-03-15 NOTE — Telephone Encounter (Signed)
Rtc to pt but her husband, [name] answered. Apparently she started hallucinating starting on Tuesday that her husband left. Although [name] is there and she calls him [name], but tells him he left her. She was also looking for the dog that was on the couch, she said her name but that wasn't the right one. [name] reports this is mostly occurring as the evening comes on. She also cant dial the #'s on the phone anymore, not sure how she called [place] last week. He wanted her to go to the hospital to be assessed but she refuses. She does have a dental apt on Wednesday, so he's hoping to get her to stop by and be assessed. [name] also reports she is allergic to Risperdal, causes hallucinations and nausea. It is listed as an allergy as well.    Informed him [name] out of town but I would update him with information and call back with recommendation.

## 2021-03-16 ENCOUNTER — Other Ambulatory Visit: Payer: Self-pay | Admitting: Psychiatry

## 2021-03-16 NOTE — Telephone Encounter (Signed)
[name] reports he also thought the UTI but she has had no c/o of pain. She's not taking anything OTC, no Benadryl, and Meclizine is only prn for her vertigo. He says it's been 2 weeks or more since she had any of that.  She does have dentist apt tomorrow so after that he's going to take her to medical center to have them check labs and urine.   Instructed him to update [place] with information.

## 2021-03-16 NOTE — Telephone Encounter (Signed)
She may have delirium from UTI.  She needs to go to the doctor or ER.  Please  verify what meds including OTC meds she's taking.  She will not know.  H administers meds.  Is she taking Benadryl or too much meclizine which could cause confusion.

## 2021-03-17 ENCOUNTER — Ambulatory Visit (HOSPITAL_COMMUNITY)
Admission: EM | Admit: 2021-03-17 | Discharge: 2021-03-17 | Disposition: A | Discharge status: Home / Self Care | Payer: Medicare HMO | Attending: Clinical | Admitting: Clinical

## 2021-03-17 ENCOUNTER — Other Ambulatory Visit: Payer: Self-pay

## 2021-03-17 DIAGNOSIS — F3181 Bipolar II disorder: Secondary | ICD-10-CM | POA: Diagnosis not present

## 2021-03-17 NOTE — ED Provider Notes (Signed)
Behavioral Health Urgent Care Medical Screening Exam  Patient Name: [name] MRN: [id number] Date of Evaluation: 03/17/21 Chief Complaint:   Diagnosis:  Final diagnoses:  [name] disorder ([place])    History of Present illness: [name] is a 70 y.o. female.  She reports presents for walk-in assessment today after outpatient psychiatrist "told me last week that he did not think he can help me anymore."   Patient is assessed face-to-face by nurse practitioner.  She request that her husband, [name], remain present during assessment.  She is seated in assessment area, no acute distress.  She is alert and oriented  to self and situation only at this time. [name] is unable to articulate the current month, reports this is typical, husband agrees.  Per patient and husband she has suffered from short-term memory loss for the last 4 to 5 years.  Patient and husband report primary care provider as well as psychiatrist have discussed medications that can affect memory, they have attempted to adjust these medications however patient continues to experience memory loss.  She was evaluated by neurology last 3 years ago, no new diagnoses at that time, per husband.  Patient and husband report confusion times approximately 2 weeks.  Patient's husband reports on yesterday patient stated "I do not want to live anymore." [name] reports that she did make a statement however she was did not feel suicidal and does not currently feel suicidal.  She states "sometimes I just feel like I do not fit in, and I do not want anybody to know so much of what is going on (mental health diagnoses) somethings are private."  [name] reports current stressors include "I miss my children."  She reports she would like to see more of her adult children, 1 of whom lives in [place], the other in [place].  She also reports a strained relationship with her adult daughter.   She is  pleasant and cooperative during  assessment.  She reports depressed mood with congruent affect, tearful at times. She denies suicidal and homicidal ideations. She denies any history of suicide attempts, denies history of self-harm. She contracts verbally for safety with this Probation officer.  She has normal speech and behavior.  She denies both auditory and visual hallucinations.  Patient is able to converse coherently with goal-directed thoughts and no distractibility or preoccupation.  She denies paranoia.  Objectively there is no evidence of psychosis/mania.  [name] has been diagnosed with bipolar disorder and generalized anxiety disorder.  She is currently followed by Dr Clovis Pu.  She reports compliance with medications including lorazepam, Lamictal, quetiapine and BuSpar.  Patient's husband assists with medication management, he agrees that she is compliant and does not miss medication doses.  She is not followed by outpatient counseling currently but would like to follow-up with outpatient individual therapy.  She has struggled with outpatient therapy because she suffers from agoraphobia states it is difficult to establish relationships with new providers and she prefers not to leave her home unless necessary.  She resides in [place] with her husband.  She denies access to weapons.  She is retired.  She denies alcohol and substance use.  She endorses increased sleep, an average of approximately 20 hours/day.  She endorses average appetite.  Patient offered support and encouragement.  Patient's husband, [name] understanding of treatment plan as well as follow-up suggestions.    Psychiatric Specialty Exam  Presentation  General Appearance:Appropriate for Environment; Casual  Eye Contact:Good  Speech:Clear and Coherent; Normal Rate  Speech Volume:Normal  Handedness:Right   Mood and Affect  Mood:Depressed  Affect:Congruent; Depressed   Thought Process  Thought Processes:Coherent; Goal Directed  Descriptions of  Associations:Intact  Orientation:Partial  Thought Content:WDL    Hallucinations:None  Ideas of Reference:None  Suicidal Thoughts:No  Homicidal Thoughts:No   Sensorium  Memory:Immediate Fair; Recent Poor; Remote Fair  Judgment:Fair  Insight:Present   Executive Functions  Concentration:Fair  Attention Span:Fair  Recall:Poor  Fund of Knowledge:Good  Language:Good   Psychomotor Activity  Psychomotor Activity:Normal   Assets  Assets:Communication Skills; Desire for Improvement; Financial Resources/Insurance; Housing; Social Support   Sleep  Sleep:Good  Number of hours:  No data recorded  No data recorded  Physical Exam: Physical Exam Vitals and nursing note reviewed.  Constitutional:      Appearance: Normal appearance. She is well-developed. She is obese.  HENT:     Head: Normocephalic and atraumatic.     Nose: Nose normal.  Cardiovascular:     Rate and Rhythm: Normal rate.  Pulmonary:     Effort: Pulmonary effort is normal.  Musculoskeletal:        General: Normal range of motion.     Cervical back: Normal range of motion.  Neurological:     Mental Status: She is alert. Mental status is at baseline.  Psychiatric:        Attention and Perception: Attention and perception normal.        Mood and Affect: Affect normal. Mood is depressed.        Speech: Speech normal.        Behavior: Behavior normal. Behavior is cooperative.        Thought Content: Thought content normal.        Cognition and Memory: Memory is impaired.   Review of Systems  Constitutional: Negative.   HENT: Negative.    Eyes: Negative.   Respiratory: Negative.    Cardiovascular: Negative.   Gastrointestinal: Negative.   Genitourinary: Negative.   Musculoskeletal: Negative.   Skin: Negative.   Neurological: Negative.   Endo/Heme/Allergies: Negative.   Psychiatric/Behavioral:  Positive for depression and memory loss.   Blood pressure 132/81, pulse 90, temperature 97.7 F  (36.5 C), temperature source Oral, resp. rate 16, SpO2 96 %. There is no height or weight on file to calculate BMI.  Musculoskeletal: Strength & Muscle Tone: within normal limits Gait & Station: normal Patient leans: N/A   [place] MSE Discharge Disposition for Follow up and Recommendations: Based on my evaluation the patient does not appear to have an emergency medical condition and can be discharged with resources and follow up care in outpatient services for Medication Management and Individual Therapy Patient reviewed with Dr. Serafina Mitchell. Continue current medications, follow-up with established outpatient psychiatry. Recommend consider follow-up with primary care provider to discuss possible neurology consult.   Lucky Rathke, FNP 03/17/2021, 2:16 PM

## 2021-03-17 NOTE — BH Assessment (Addendum)
Comprehensive Clinical Assessment (CCA) Note  03/17/2021 [name] [id number]  Patient is a 70 year old female presenting voluntarily to [facility] for evaluation. She is accompanied by her husband, [name], who is present for evaluation and assists with providing history. Patient is a poor historian due to [name]. She is unable to state what month it is, where she is or who the president is. She states the last president was [name]. She reports a history of bipolar disorder and is followed by [name] for medication management. Husband states patient was diagnosed with dementia 2 years ago but over the last 2 weeks has become increasingly confused and delusional at times. He states yesterday patient expressed SI out of frustration for her current mental state. They are unable to get her an appointment with [name] until November. Patient denies current SI/HI/AVH. She denies any prior suicide attempts or access to firearms. Husband confirms this. Patient is linked with neurologist and PCP.  Per [name], FNP patient does not meet in patient care criteria and is psych cleared for d/c.  Chief Complaint:  Chief Complaint  Patient presents with   Delusional    Pt's husband brought her in -- her husband [name] -- Pt believes that her husband has been replaced.  Pt is tearful, confused.  Started about two weeks ago.  Currently treated for Bipolar D/O by [name].   Visit Diagnosis: Bipolar (per history) Dementia    CCA Screening, Triage and Referral (STR)  Patient Reported Information How did you hear about [place]? Family/Friend (Husband [name])  What Is the Reason for Your Visit/Call Today? Increased delusion  How Long Has This Been Causing You Problems? 1 wk - 1 month  What Do You Feel Would Help You the Most Today? Medication(s)   Have You Recently Had Any Thoughts About Hurting Yourself? Yes  Are You Planning to Commit Suicide/Harm Yourself At This time? No   Have you  Recently Had Thoughts About [place]? No  Are You Planning to Harm Someone at This Time? No  Explanation: No data recorded  Have You Used Any Alcohol or Drugs in the Past 24 Hours? No  How Long Ago Did You Use Drugs or Alcohol? No data recorded What Did You Use and How Much? No data recorded  Do You Currently Have a Therapist/Psychiatrist? Yes  Name of Therapist/Psychiatrist: [name]   Have You Been Recently Discharged From Any Office Practice or Programs? No  Explanation of Discharge From Practice/Program: No data recorded    CCA Screening Triage Referral Assessment Type of Contact: Face-to-Face  Telemedicine Service Delivery:   Is this Initial or Reassessment? No data recorded Date Telepsych consult ordered in CHL:  No data recorded Time Telepsych consult ordered in CHL:  No data recorded Location of Assessment: [facility]  Provider Location: [facility] Assessment Services   Collateral Involvement: husband, [name]   Does Patient Have a [place]? No data recorded Name and Contact of Legal Guardian: No data recorded If Minor and Not Living with Parent(s), Who has Custody? No data recorded Is CPS involved or ever been involved? Never  Is APS involved or ever been involved? Never   Patient Determined To Be At Risk for Harm To Self or Others Based on Review of Patient Reported Information or Presenting Complaint? No  Method: No data recorded Availability of Means: No data recorded Intent: No data recorded Notification Required: No data recorded Additional Information for  Danger to Others Potential: No data recorded Additional Comments for Danger to Others Potential: No data recorded Are There Guns or Other Weapons in [place] No data recorded Types of Guns/Weapons: No data recorded Are These Weapons Safely Secured?                            No data recorded Who Could Verify You Are Able To Have These Secured: No  data recorded Do You Have any Outstanding Charges, Pending Court Dates, Parole/Probation? No data recorded Contacted To Inform of Risk of Harm To Self or Others: No data recorded   Does Patient Present under Involuntary Commitment? No  IVC Papers Initial File Date: No data recorded  [place] of Residence: [place]   Patient Currently Receiving the Following Services: Medication Management   Determination of Need: Urgent (48 hours)   Options For Referral: Outpatient Therapy; Other: Comment (neurology)     CCA Biopsychosocial Patient Reported Schizophrenia/Schizoaffective Diagnosis in Past: No   Strengths: outpatient care, good support system   Mental Health Symptoms Depression:   Difficulty Concentrating; Change in energy/activity; Fatigue; Hopelessness; Increase/decrease in appetite; Irritability; Sleep (too much or little); Tearfulness; Weight gain/loss; Worthlessness   Duration of Depressive symptoms:  Duration of Depressive Symptoms: Greater than two weeks   Mania:   None   Anxiety:    Worrying; Tension; Difficulty concentrating   Psychosis:   None   Duration of Psychotic symptoms:    Trauma:   None   Obsessions:   None   Compulsions:   None   Inattention:   None   Hyperactivity/Impulsivity:   None   Oppositional/Defiant Behaviors:   None   Emotional Irregularity:   None   Other Mood/Personality Symptoms:  No data recorded   Mental Status Exam Appearance and self-care  Stature:   Average   Weight:   Obese   Clothing:   Neat/clean   Grooming:   Normal   Cosmetic use:   None   Posture/gait:   Normal   Motor activity:   Not Remarkable   Sensorium  Attention:   Confused   Concentration:   Focuses on irrelevancies   Orientation:   Person   Recall/memory:   Defective in Short-term; Defective in Remote; Defective in Recent; Defective in Immediate   Affect and Mood  Affect:   Blunted   Mood:   Dysphoric    Relating  Eye contact:   Normal   Facial expression:   Responsive   Attitude toward examiner:   Cooperative   Thought and Language  Speech flow:  Slow   Thought content:   Appropriate to Mood and Circumstances   Preoccupation:   None   Hallucinations:   None   Organization:  No data recorded  [employer] of Knowledge:   Good   Intelligence:   Average   Abstraction:   Concrete   Judgement:   Impaired   Reality Testing:   Distorted   Insight:   Poor   Decision Making:   Paralyzed   Social Functioning  Social Maturity:   Irresponsible   Social Judgement:   Heedless   Stress  Stressors:   Illness   Coping Ability:   Overwhelmed   Skill Deficits:   Decision making; Communication; Responsibility; Self-care   Supports:   Family; Friends/Service system     Religion: Religion/Spirituality Are You A Religious Person?: Yes What is Your Religious Affiliation?: Christian  Leisure/Recreation: Leisure /  Recreation Do You Have Hobbies?: No  Exercise/Diet: Exercise/Diet Do You Exercise?: No Have You Gained or Lost A Significant Amount of Weight in the Past Six Months?: No Do You Follow a Special Diet?: No Do You Have Any Trouble Sleeping?: Yes Explanation of Sleeping Difficulties: husband states sleeps 20 hours per day   CCA Employment/Education Employment/Work Situation: Employment / Work Situation Employment Situation: Retired Social research officer, government has Been Impacted by Current Illness: No Has Patient ever Been in Passenger transport manager?: No  Education: Education Is Patient Currently Attending School?: No Last Grade Completed: 12 Did You Nutritional therapist?: No Did You Have An Individualized Education Program (IIEP): No Did You Have Any Difficulty At [employer]?: No Patient's Education Has Been Impacted by Current Illness: No   CCA Family/Childhood History Family and Relationship History: Family history Marital status: Married Number of  Years Married:  Special educational needs teacher) What types of issues is patient dealing with in the relationship?: none noted Does patient have children?: Yes How many children?: 2 How is patient's relationship with their children?: adult children, live nearby  Childhood History:  Childhood History By whom was/is the patient raised?: Both parents Did patient suffer any verbal/emotional/physical/sexual abuse as a child?: No Did patient suffer from severe childhood neglect?: No Has patient ever been sexually abused/assaulted/raped as an adolescent or adult?: No Was the patient ever a victim of a crime or a disaster?: No Witnessed domestic violence?: No Has patient been affected by domestic violence as an adult?: No  Child/Adolescent Assessment:     CCA Substance Use Alcohol/Drug Use: Alcohol / Drug Use Pain Medications: see MAR Prescriptions: see MAR Over the Counter: see MAR History of alcohol / drug use?: No history of alcohol / drug abuse                         ASAM's:  Six Dimensions of Multidimensional Assessment  Dimension 1:  Acute Intoxication and/or Withdrawal Potential:      Dimension 2:  Biomedical Conditions and Complications:      Dimension 3:  Emotional, Behavioral, or Cognitive Conditions and Complications:     Dimension 4:  Readiness to Change:     Dimension 5:  Relapse, Continued use, or Continued Problem Potential:     Dimension 6:  Recovery/Living Environment:     ASAM Severity Score:    ASAM Recommended Level of Treatment:     Substance use Disorder (SUD)    Recommendations for Services/Supports/Treatments:    Discharge Disposition:    DSM5 Diagnoses: Patient Active Problem List   Diagnosis Date Noted   [name] disorder ([place]) 09/19/2018   GAD (generalized anxiety disorder) 09/19/2018   Panic 09/19/2018   GERD (gastroesophageal reflux disease) 05/20/2014   Abdominal pain, generalized 05/20/2014   Nausea and vomiting 11/15/2012   Diarrhea 11/15/2012    OSA on CPAP 11/15/2012   Bipolar 1 disorder, depressed ([place]) 11/15/2012   Incisional hernia without mention of obstruction or gangrene 07/19/2011   DYSPNEA 10/25/2010   HYPERLIPIDEMIA-MIXED 10/22/2010   OBESITY-MORBID (>100') 10/22/2010   HYPERTENSION, UNSPECIFIED 10/22/2010   Atrial fibrillation ([place]) 10/22/2010   CHEST PAIN-UNSPECIFIED 10/22/2010     Referrals to Alternative Service(s): Referred to Alternative Service(s):   Place:   Date:   Time:    Referred to Alternative Service(s):   Place:   Date:   Time:    Referred to Alternative Service(s):   Place:   Date:   Time:    Referred to Alternative Service(s):  Place:   Date:   Time:     Orvis Brill, LCSW

## 2021-03-17 NOTE — Discharge Instructions (Signed)

## 2021-03-17 NOTE — ED Notes (Signed)
Pt discharged in no acute distress. Verbalized understanding of discharge instructions reviewed on AVS  To include follow up care recommendations. Safety maintained.

## 2021-03-22 ENCOUNTER — Other Ambulatory Visit: Payer: Self-pay | Admitting: Psychiatry

## 2021-03-22 ENCOUNTER — Telehealth: Payer: Self-pay | Admitting: Psychiatry

## 2021-03-22 MED ORDER — HALOPERIDOL 2 MG PO TABS
2.0000 mg | ORAL_TABLET | Freq: Every evening | ORAL | 0 refills | Status: DC
Start: 2021-03-22 — End: 2021-05-11

## 2021-03-22 NOTE — Telephone Encounter (Signed)
Please review

## 2021-03-22 NOTE — Telephone Encounter (Signed)
Patient has been very unstable with bipolar disorder mixed symptoms lately also with some delirium of unknown cause.  She will be seeing her primary care physician tomorrow.  Due to the severity of her symptoms it is suggested that she add haloperidol 2 mg nightly to her current quetiapine 800 mg nightly since we cannot increase the quetiapine dose further.  This may help with her confusion and agitation.  It is okay to talk with her husband because he administers her medication and because she is too confused to understand the instructions.

## 2021-03-22 NOTE — Telephone Encounter (Signed)
[name], Pt's H, called . He took [place] to the [employer] last week. She has been having problems - delusional, crying a lot, expressing a desire for it all to go away. H had called to try to get her in for therapy asap. With Medicare we are limited in who can see her. May warrant a f/up call from MD re: her care.

## 2021-03-23 DIAGNOSIS — E039 Hypothyroidism, unspecified: Secondary | ICD-10-CM | POA: Diagnosis not present

## 2021-03-23 DIAGNOSIS — R413 Other amnesia: Secondary | ICD-10-CM | POA: Diagnosis not present

## 2021-03-23 DIAGNOSIS — F319 Bipolar disorder, unspecified: Secondary | ICD-10-CM | POA: Diagnosis not present

## 2021-03-23 DIAGNOSIS — F411 Generalized anxiety disorder: Secondary | ICD-10-CM | POA: Diagnosis not present

## 2021-03-23 NOTE — Telephone Encounter (Signed)
Rtc to husband and he reports she is doing better today. They did see primary care earlier today but does not sound like any changes were made. He felt she didn't need the haloperidol currently but would call back if anything changes.

## 2021-03-23 NOTE — Telephone Encounter (Signed)
Okay to hold haloperidol if her symptoms have improved.

## 2021-03-26 ENCOUNTER — Encounter: Payer: Self-pay | Admitting: Psychology

## 2021-03-28 ENCOUNTER — Other Ambulatory Visit: Payer: Self-pay | Admitting: Psychiatry

## 2021-03-29 NOTE — Telephone Encounter (Signed)
Kolette's husband called to ask about RF for Lorazepam. He would like [place] to update her dosage to 1 in am, 2 1/2 night per [name]. To [employer].

## 2021-03-31 ENCOUNTER — Telehealth: Payer: Self-pay | Admitting: Psychiatry

## 2021-03-31 NOTE — Telephone Encounter (Signed)
Next visit is 05/11/21. [name]'s husband called to check on the update on the prior authorization for her Lorazepam. [facility] will be out of the medicine on Saturday. They are concerned about the prior authorization and not being able to get more pills on Saturday if the prior authorization is not approved. Phone number is [phone].

## 2021-03-31 NOTE — Telephone Encounter (Signed)
Please review

## 2021-04-01 NOTE — Telephone Encounter (Signed)
Prior authorization pending at this time. She has never needed 1 before so there must be a change in something

## 2021-04-01 NOTE — Telephone Encounter (Signed)
Prior authorization already received back effective 08/21/2021 for Lorazepam 0.5 mg #105   Informed husband.

## 2021-04-15 ENCOUNTER — Encounter (HOSPITAL_COMMUNITY): Payer: Self-pay | Admitting: Emergency Medicine

## 2021-04-15 ENCOUNTER — Emergency Department (HOSPITAL_COMMUNITY)
Admission: EM | Admit: 2021-04-15 | Discharge: 2021-04-17 | Disposition: A | Discharge status: Other Acute Inpatient Hospital | Payer: Medicare HMO | Attending: Emergency Medicine | Admitting: Emergency Medicine

## 2021-04-15 ENCOUNTER — Emergency Department (HOSPITAL_COMMUNITY): Payer: Medicare HMO

## 2021-04-15 ENCOUNTER — Other Ambulatory Visit: Payer: Self-pay

## 2021-04-15 DIAGNOSIS — R410 Disorientation, unspecified: Secondary | ICD-10-CM | POA: Insufficient documentation

## 2021-04-15 DIAGNOSIS — I110 Hypertensive heart disease with heart failure: Secondary | ICD-10-CM | POA: Insufficient documentation

## 2021-04-15 DIAGNOSIS — Z7984 Long term (current) use of oral hypoglycemic drugs: Secondary | ICD-10-CM | POA: Diagnosis not present

## 2021-04-15 DIAGNOSIS — Z79899 Other long term (current) drug therapy: Secondary | ICD-10-CM | POA: Insufficient documentation

## 2021-04-15 DIAGNOSIS — Z20822 Contact with and (suspected) exposure to covid-19: Secondary | ICD-10-CM | POA: Insufficient documentation

## 2021-04-15 DIAGNOSIS — F3164 Bipolar disorder, current episode mixed, severe, with psychotic features: Secondary | ICD-10-CM | POA: Diagnosis present

## 2021-04-15 DIAGNOSIS — F319 Bipolar disorder, unspecified: Secondary | ICD-10-CM | POA: Diagnosis not present

## 2021-04-15 DIAGNOSIS — J45909 Unspecified asthma, uncomplicated: Secondary | ICD-10-CM | POA: Insufficient documentation

## 2021-04-15 DIAGNOSIS — E039 Hypothyroidism, unspecified: Secondary | ICD-10-CM | POA: Diagnosis not present

## 2021-04-15 DIAGNOSIS — I509 Heart failure, unspecified: Secondary | ICD-10-CM | POA: Diagnosis not present

## 2021-04-15 DIAGNOSIS — R443 Hallucinations, unspecified: Secondary | ICD-10-CM | POA: Diagnosis not present

## 2021-04-15 DIAGNOSIS — Y900 Blood alcohol level of less than 20 mg/100 ml: Secondary | ICD-10-CM | POA: Diagnosis not present

## 2021-04-15 DIAGNOSIS — E119 Type 2 diabetes mellitus without complications: Secondary | ICD-10-CM | POA: Insufficient documentation

## 2021-04-15 DIAGNOSIS — N183 Chronic kidney disease, stage 3 unspecified: Secondary | ICD-10-CM | POA: Diagnosis not present

## 2021-04-15 DIAGNOSIS — I129 Hypertensive chronic kidney disease with stage 1 through stage 4 chronic kidney disease, or unspecified chronic kidney disease: Secondary | ICD-10-CM | POA: Diagnosis not present

## 2021-04-15 DIAGNOSIS — Z7982 Long term (current) use of aspirin: Secondary | ICD-10-CM | POA: Diagnosis not present

## 2021-04-15 DIAGNOSIS — F23 Brief psychotic disorder: Secondary | ICD-10-CM | POA: Diagnosis not present

## 2021-04-15 DIAGNOSIS — I10 Essential (primary) hypertension: Secondary | ICD-10-CM | POA: Diagnosis not present

## 2021-04-15 DIAGNOSIS — E7800 Pure hypercholesterolemia, unspecified: Secondary | ICD-10-CM | POA: Diagnosis not present

## 2021-04-15 DIAGNOSIS — K219 Gastro-esophageal reflux disease without esophagitis: Secondary | ICD-10-CM | POA: Diagnosis not present

## 2021-04-15 DIAGNOSIS — F29 Unspecified psychosis not due to a substance or known physiological condition: Secondary | ICD-10-CM | POA: Diagnosis not present

## 2021-04-15 DIAGNOSIS — E1129 Type 2 diabetes mellitus with other diabetic kidney complication: Secondary | ICD-10-CM | POA: Diagnosis not present

## 2021-04-15 DIAGNOSIS — F3181 Bipolar II disorder: Secondary | ICD-10-CM | POA: Diagnosis present

## 2021-04-15 LAB — ETHANOL: Alcohol, Ethyl (B): <10 mg/dL (ref <10)

## 2021-04-15 LAB — CBC WITH DIFFERENTIAL/PLATELET
Abs Immature Granulocytes: 0.01 10*3/uL (ref 0.00–0.07)
Basophils Absolute: 0.1 10*3/uL (ref 0.0–0.1)
Basophils Relative: 2 %
Eosinophils Absolute: 0.1 10*3/uL (ref 0.0–0.5)
Eosinophils Relative: 1 %
HCT: 41 % (ref 36.0–46.0)
Hemoglobin: 14.1 g/dL (ref 12.0–15.0)
Immature Granulocytes: 0 %
Lymphocytes Relative: 20 %
Lymphs Abs: 1.2 10*3/uL (ref 0.7–4.0)
MCH: 32.3 pg (ref 26.0–34.0)
MCHC: 34.4 g/dL (ref 30.0–36.0)
MCV: 93.8 fL (ref 80.0–100.0)
Monocytes Absolute: 0.4 10*3/uL (ref 0.1–1.0)
Monocytes Relative: 7 %
Neutro Abs: 3.9 10*3/uL (ref 1.7–7.7)
Neutrophils Relative %: 70 %
Platelets: 221 10*3/uL (ref 150–400)
RBC: 4.37 MIL/uL (ref 3.87–5.11)
RDW: 13.7 % (ref 11.5–15.5)
WBC: 5.7 10*3/uL (ref 4.0–10.5)
nRBC: 0 % (ref 0.0–0.2)

## 2021-04-15 LAB — COMPREHENSIVE METABOLIC PANEL
ALT: 21 U/L (ref 0–44)
AST: 26 U/L (ref 15–41)
Albumin: 4.2 g/dL (ref 3.5–5.0)
Alkaline Phosphatase: 104 U/L (ref 38–126)
Anion gap: 10 (ref 5–15)
BUN: 15 mg/dL (ref 8–23)
CO2: 27 mmol/L (ref 22–32)
Calcium: 11.1 mg/dL — ABNORMAL HIGH (ref 8.9–10.3)
Chloride: 106 mmol/L (ref 98–111)
Creatinine, Ser: 0.91 mg/dL (ref 0.44–1.00)
GFR, Estimated: >60 mL/min (ref >60)
Glucose, Bld: 162 mg/dL — ABNORMAL HIGH (ref 70–99)
Potassium: 4.2 mmol/L (ref 3.5–5.1)
Sodium: 143 mmol/L (ref 135–145)
Total Bilirubin: 0.7 mg/dL (ref 0.3–1.2)
Total Protein: 7.5 g/dL (ref 6.5–8.1)

## 2021-04-15 LAB — URINALYSIS, ROUTINE W REFLEX MICROSCOPIC
Bacteria, UA: NONE SEEN
Bilirubin Urine: NEGATIVE
Glucose, UA: NEGATIVE mg/dL
Hgb urine dipstick: NEGATIVE
Ketones, ur: NEGATIVE mg/dL
Nitrite: NEGATIVE
Protein, ur: NEGATIVE mg/dL
Specific Gravity, Urine: 1.017 (ref 1.005–1.030)
pH: 6 (ref 5.0–8.0)

## 2021-04-15 LAB — RAPID URINE DRUG SCREEN, HOSP PERFORMED
Amphetamines: NOT DETECTED
Barbiturates: NOT DETECTED
Benzodiazepines: POSITIVE — AB
Cocaine: NOT DETECTED
Opiates: NOT DETECTED
Tetrahydrocannabinol: NOT DETECTED

## 2021-04-15 LAB — RESP PANEL BY RT-PCR (FLU A&B, COVID) ARPGX2
Influenza A by PCR: NEGATIVE
Influenza B by PCR: NEGATIVE
SARS Coronavirus 2 by RT PCR: NEGATIVE

## 2021-04-15 LAB — AMMONIA: Ammonia: 23 umol/L (ref 9–35)

## 2021-04-15 LAB — SALICYLATE LEVEL: Salicylate Lvl: <7 mg/dL — ABNORMAL LOW (ref 7.0–30.0)

## 2021-04-15 LAB — ACETAMINOPHEN LEVEL: Acetaminophen (Tylenol), Serum: <10 ug/mL — ABNORMAL LOW (ref 10–30)

## 2021-04-15 MED ORDER — POLYETHYLENE GLYCOL 3350 17 G PO PACK: 17.0000 g | PACK | Freq: Two times a day (BID) | ORAL | Status: DC | PRN

## 2021-04-15 MED ORDER — LEVOTHYROXINE SODIUM 100 MCG PO TABS: 100.0000 ug | ORAL_TABLET | Freq: Every day | ORAL | Status: DC

## 2021-04-15 MED ORDER — FUROSEMIDE 40 MG PO TABS: 20.0000 mg | ORAL_TABLET | Freq: Every day | ORAL | Status: DC | PRN

## 2021-04-15 MED ORDER — LEVOTHYROXINE SODIUM 100 MCG PO TABS
100.0000 ug | ORAL_TABLET | Freq: Every day | ORAL | Status: DC
  Administered 2021-04-16 – 2021-04-17 (×2): 100 ug via ORAL
  Filled 2021-04-15 (×2): qty 1

## 2021-04-15 MED ORDER — PHENAZOPYRIDINE HCL 200 MG PO TABS: 200.0000 mg | ORAL_TABLET | Freq: Three times a day (TID) | ORAL | Status: DC | PRN

## 2021-04-15 MED ORDER — MECLIZINE HCL 25 MG PO TABS: 25.0000 mg | ORAL_TABLET | Freq: Three times a day (TID) | ORAL | Status: DC | PRN

## 2021-04-15 MED ORDER — HALOPERIDOL 1 MG PO TABS
2.0000 mg | ORAL_TABLET | Freq: Once | ORAL | Status: AC | PRN
  Administered 2021-04-16: 2 mg via ORAL
  Filled 2021-04-15: qty 2

## 2021-04-15 MED ORDER — LORATADINE 10 MG PO TABS
10.0000 mg | ORAL_TABLET | Freq: Every day | ORAL | Status: DC
  Administered 2021-04-16: 10 mg via ORAL
  Filled 2021-04-15: qty 1

## 2021-04-15 MED ORDER — GLIMEPIRIDE 1 MG PO TABS
1.0000 mg | ORAL_TABLET | Freq: Every day | ORAL | Status: DC
  Administered 2021-04-16: 1 mg via ORAL
  Filled 2021-04-15 (×2): qty 1

## 2021-04-15 MED ORDER — LORAZEPAM 0.5 MG PO TABS: 0.5000 mg | ORAL_TABLET | Freq: Every day | ORAL | Status: DC

## 2021-04-15 MED ORDER — ASPIRIN 81 MG PO CHEW
81.0000 mg | CHEWABLE_TABLET | Freq: Every day | ORAL | Status: DC
  Administered 2021-04-16: 81 mg via ORAL
  Filled 2021-04-15: qty 1

## 2021-04-15 MED ORDER — FAMOTIDINE 20 MG PO TABS
20.0000 mg | ORAL_TABLET | Freq: Every day | ORAL | Status: DC
  Administered 2021-04-16: 20 mg via ORAL
  Filled 2021-04-15: qty 1

## 2021-04-15 MED ORDER — LORAZEPAM 2 MG/ML IJ SOLN
1.0000 mg | Freq: Once | INTRAMUSCULAR | Status: AC
  Administered 2021-04-15: 1 mg via INTRAVENOUS
  Filled 2021-04-15: qty 1

## 2021-04-15 MED ORDER — MELOXICAM 15 MG PO TABS
15.0000 mg | ORAL_TABLET | Freq: Every day | ORAL | Status: DC
  Administered 2021-04-16: 15 mg via ORAL
  Filled 2021-04-15 (×2): qty 1

## 2021-04-15 MED ORDER — CEPHALEXIN 250 MG PO CAPS
250.0000 mg | ORAL_CAPSULE | Freq: Every day | ORAL | Status: DC
  Administered 2021-04-15 – 2021-04-16 (×2): 250 mg via ORAL
  Filled 2021-04-15 (×2): qty 1

## 2021-04-15 MED ORDER — METOPROLOL SUCCINATE ER 50 MG PO TB24
50.0000 mg | ORAL_TABLET | Freq: Every evening | ORAL | Status: DC
  Administered 2021-04-16: 50 mg via ORAL
  Filled 2021-04-15: qty 1

## 2021-04-15 MED ORDER — BUSPIRONE HCL 10 MG PO TABS
30.0000 mg | ORAL_TABLET | Freq: Two times a day (BID) | ORAL | Status: DC
  Administered 2021-04-15 – 2021-04-16 (×3): 30 mg via ORAL
  Filled 2021-04-15 (×3): qty 3

## 2021-04-15 MED ORDER — AMLODIPINE BESYLATE 5 MG PO TABS
5.0000 mg | ORAL_TABLET | Freq: Every evening | ORAL | Status: DC
  Administered 2021-04-16: 5 mg via ORAL
  Filled 2021-04-15: qty 1

## 2021-04-15 MED ORDER — ACETAMINOPHEN 325 MG PO TABS: 650.0000 mg | ORAL_TABLET | Freq: Four times a day (QID) | ORAL | Status: DC | PRN

## 2021-04-15 MED ORDER — LORAZEPAM 0.5 MG PO TABS: 0.5000 mg | ORAL_TABLET | ORAL | Status: DC

## 2021-04-15 MED ORDER — LORAZEPAM 1 MG PO TABS
1.2500 mg | ORAL_TABLET | Freq: Every day | ORAL | Status: DC
  Administered 2021-04-15: 1.25 mg via ORAL
  Filled 2021-04-15: qty 1

## 2021-04-15 MED ORDER — QUETIAPINE FUMARATE ER 300 MG PO TB24
800.0000 mg | ORAL_TABLET | Freq: Every day | ORAL | Status: DC
  Administered 2021-04-15 – 2021-04-16 (×2): 800 mg via ORAL
  Filled 2021-04-15 (×2): qty 1

## 2021-04-15 MED ORDER — POTASSIUM CHLORIDE CRYS ER 20 MEQ PO TBCR
20.0000 meq | EXTENDED_RELEASE_TABLET | Freq: Every day | ORAL | Status: DC
  Administered 2021-04-16: 20 meq via ORAL
  Filled 2021-04-15: qty 1

## 2021-04-15 MED ORDER — LEVOCETIRIZINE DIHYDROCHLORIDE 5 MG PO TABS: 5.0000 mg | ORAL_TABLET | Freq: Every evening | ORAL | Status: DC

## 2021-04-15 MED ORDER — LISINOPRIL 20 MG PO TABS
20.0000 mg | ORAL_TABLET | Freq: Every day | ORAL | Status: DC
  Administered 2021-04-16: 20 mg via ORAL
  Filled 2021-04-15: qty 1

## 2021-04-15 MED ORDER — LAMOTRIGINE 25 MG PO TABS
150.0000 mg | ORAL_TABLET | Freq: Two times a day (BID) | ORAL | Status: DC
  Administered 2021-04-15 – 2021-04-16 (×3): 150 mg via ORAL
  Filled 2021-04-15 (×3): qty 2

## 2021-04-15 MED ORDER — SIMVASTATIN 20 MG PO TABS
40.0000 mg | ORAL_TABLET | Freq: Every evening | ORAL | Status: DC
  Administered 2021-04-16: 40 mg via ORAL
  Filled 2021-04-15: qty 2

## 2021-04-15 NOTE — ED Notes (Signed)
Gave bedside report to [facility], [place]

## 2021-04-15 NOTE — BH Assessment (Addendum)
Comprehensive Clinical Assessment (CCA) Note  04/15/2021 [name] [id number] Disposition: Clinician discussed patient care with Yolanda John, PA.  He recommends inpatient geropsych placement for patient.  RN [name] and PA [name] were informed of recommendation via secure messaging.    Patient is oriented x2 and has normal eye contact.  Pt responds to internal stimuli.  She has delusional thoughts that make her grasp of reality tenuous and impacts her health.  Patient is not left alone by husband, if he has to go to the store he gets the daughter to watch her.  Patient has poor sleep and is up and down at night.    Pt has Dr. [name] as her psychiatrist and she has an appointment with him in mid September.   She has an appointment for neurology at [facility] in November.     Chief Complaint: No chief complaint on file.  Visit Diagnosis: [name] d/o    CCA Screening, Triage and Referral (STR)  Patient Reported Information How did you hear about [place]? Family/Friend (Husband brought her to [facility].)  What Is the Reason for Your Visit/Call Today? Pt was brought to [facility] by husband.  Pt was seen at [facility] on 03/17/21.  Today, patient told husband that she thought she needed to come to ED "becasue she felt like she was having a nervous breakdown."  Pt has been seeing people coming in and out of the house, stealing their cars.  She gets to thinking that her husband is in 3 places at once in the house.  Pt denies any SI or HI.  She says that having hallucinations is new to her.  "I just want this to stop."  These hallucinations & delusions started up about 6 weeks ago.  Husband said that "it was like flipping a switch."  About two months ago she had a fall but CT scan in the ED did not reveal anything according to husband.  Pt becomes tearful at times during assessment.  She is labile, confused and fixated on delusions that people are breaking in and stealing.  Husband administers  her medications.  Patient says that she wants to go home and that she feels safer there.  No access to guns.  How Long Has This Been Causing You Problems? 1-6 months  What Do You Feel Would Help You the Most Today? Treatment for Depression or other mood problem   Have You Recently Had Any Thoughts About Hurting Yourself? No  Are You Planning to Commit Suicide/Harm Yourself At This time? No   Have you Recently Had Thoughts About [place]? No  Are You Planning to Harm Someone at This Time? No  Explanation: No data recorded  Have You Used Any Alcohol or Drugs in the Past 24 Hours? No  How Long Ago Did You Use Drugs or Alcohol? No data recorded What Did You Use and How Much? No data recorded  Do You Currently Have a Therapist/Psychiatrist? Yes  Name of Therapist/Psychiatrist: Dr. [name].  Next appt is in the middle of September.   Have You Been Recently Discharged From Any Office Practice or Programs? No  Explanation of Discharge From Practice/Program: No data recorded    CCA Screening Triage Referral Assessment Type of Contact: Tele-Assessment  Telemedicine Service Delivery:   Is this Initial or Reassessment? Initial Assessment  Date Telepsych consult ordered in [facility]:  04/15/21  Time Telepsych consult ordered in [facility]:  1957  Location of Assessment: [facility]  ED  Provider Location: [facility]   Collateral Involvement: [name], husband present during assessment.   Does Patient Have a Stage manager Guardian? No data recorded Name and Contact of Legal Guardian: No data recorded If Minor and Not Living with Parent(s), Who has Custody? No data recorded Is CPS involved or ever been involved? Never  Is APS involved or ever been involved? Never   Patient Determined To Be At Risk for Harm To Self or Others Based on Review of Patient Reported Information or Presenting Complaint? No  Method: No data recorded Availability of Means:  No data recorded Intent: No data recorded Notification Required: No data recorded Additional Information for Danger to Others Potential: No data recorded Additional Comments for Danger to Others Potential: No data recorded Are There Guns or Other Weapons in Your Home? No data recorded Types of Guns/Weapons: No data recorded Are These Weapons Safely Secured?                            No data recorded Who Could Verify You Are Able To Have These Secured: No data recorded Do You Have any Outstanding Charges, Pending Court Dates, Parole/Probation? No data recorded Contacted To Inform of Risk of Harm To Self or Others: No data recorded   Does Patient Present under Involuntary Commitment? No  IVC Papers Initial File Date: No data recorded  [place] of Residence: [place]   Patient Currently Receiving the Following Services: Medication Management   Determination of Need: Urgent (48 hours)   Options For Referral: Inpatient Hospitalization (Geropsych placement recommended.)     CCA Biopsychosocial Patient Reported Schizophrenia/Schizoaffective Diagnosis in Past: No   Strengths: outpatient care, good support system   Mental Health Symptoms Depression:   Change in energy/activity; Difficulty Concentrating; Fatigue; Sleep (too much or little); Tearfulness; Worthlessness; Hopelessness   Duration of Depressive symptoms:  Duration of Depressive Symptoms: Greater than two weeks   Mania:   None   Anxiety:    Worrying; Tension; Difficulty concentrating   Psychosis:   Hallucinations; Delusions   Duration of Psychotic symptoms:  Duration of Psychotic Symptoms: Less than six months   Trauma:   None   Obsessions:   None   Compulsions:   None   Inattention:   None   Hyperactivity/Impulsivity:   None   Oppositional/Defiant Behaviors:   None   Emotional Irregularity:   None   Other Mood/Personality Symptoms:  No data recorded   Mental Status Exam Appearance and  self-care  Stature:   Average   Weight:   Obese   Clothing:   Casual   Grooming:   Normal   Cosmetic use:   None   Posture/gait:   Normal   Motor activity:   Not Remarkable   Sensorium  Attention:   Confused; Distractible   Concentration:   Focuses on irrelevancies; Scattered   Orientation:   Person; Place   Recall/memory:   Defective in Short-term; Defective in Remote; Defective in Recent; Defective in Immediate   Affect and Mood  Affect:   Anxious; Tearful   Mood:   Dysphoric   Relating  Eye contact:   Normal   Facial expression:   Sad; Depressed   Attitude toward examiner:   Cooperative   Thought and Language  Speech flow:  Flight of Ideas   Thought content:   Appropriate to Mood and Circumstances   Preoccupation:   None   Hallucinations:   Auditory;  Visual   Organization:  No data recorded  [employer] of Knowledge:   Fair   Intelligence:   Average   Abstraction:   Concrete   Judgement:   Impaired   Reality Testing:   Distorted   Insight:   Poor   Decision Making:   Paralyzed   Social Functioning  Social Maturity:   Irresponsible   Social Judgement:   Heedless   Stress  Stressors:   Illness   Coping Ability:   Overwhelmed   Skill Deficits:   Decision making; Communication; Responsibility; Self-care   Supports:   Family; Friends/Service system     Religion: Religion/Spirituality Are You A Religious Person?: Yes What is Your Religious Affiliation?: Christian  Leisure/Recreation: Leisure / Recreation Do You Have Hobbies?: No  Exercise/Diet: Exercise/Diet Do You Exercise?: No Have You Gained or Lost A Significant Amount of Weight in the Past Six Months?: No Do You Follow a Special Diet?: No Do You Have Any Trouble Sleeping?: Yes Explanation of Sleeping Difficulties: husband states sleeps 20 hours per day   CCA Employment/Education Employment/Work Situation: Employment / Work  Situation Employment Situation: Retired Social research officer, government has Been Impacted by Current Illness: No Has Patient ever Been in Passenger transport manager?: No  Education: Education Is Patient Currently Attending School?: No Last Grade Completed: 12   CCA Family/Childhood History Family and Relationship History: Family history Marital status: Married What types of issues is patient dealing with in the relationship?: none noted Does patient have children?: Yes How many children?: 2 How is patient's relationship with their children?: adult children, live nearby  Childhood History:  Childhood History By whom was/is the patient raised?: Both parents Did patient suffer any verbal/emotional/physical/sexual abuse as a child?: No Has patient ever been sexually abused/assaulted/raped as an adolescent or adult?: No Witnessed domestic violence?: No Has patient been affected by domestic violence as an adult?: No  Child/Adolescent Assessment:     CCA Substance Use Alcohol/Drug Use: Alcohol / Drug Use Pain Medications: see MAR Prescriptions: see MAR Over the Counter: see MAR History of alcohol / drug use?: No history of alcohol / drug abuse                         ASAM's:  Six Dimensions of Multidimensional Assessment  Dimension 1:  Acute Intoxication and/or Withdrawal Potential:      Dimension 2:  Biomedical Conditions and Complications:      Dimension 3:  Emotional, Behavioral, or Cognitive Conditions and Complications:     Dimension 4:  Readiness to Change:     Dimension 5:  Relapse, Continued use, or Continued Problem Potential:     Dimension 6:  Recovery/Living Environment:     ASAM Severity Score:    ASAM Recommended Level of Treatment:     Substance use Disorder (SUD)    Recommendations for Services/Supports/Treatments:    Discharge Disposition:    DSM5 Diagnoses: Patient Active Problem List   Diagnosis Date Noted   [name] disorder ([place]) 09/19/2018   GAD  (generalized anxiety disorder) 09/19/2018   Panic 09/19/2018   GERD (gastroesophageal reflux disease) 05/20/2014   Abdominal pain, generalized 05/20/2014   Nausea and vomiting 11/15/2012   Diarrhea 11/15/2012   OSA on CPAP 11/15/2012   Bipolar 1 disorder, depressed ([place]) 11/15/2012   Incisional hernia without mention of obstruction or gangrene 07/19/2011   DYSPNEA 10/25/2010   HYPERLIPIDEMIA-MIXED 10/22/2010   OBESITY-MORBID (>100') 10/22/2010   HYPERTENSION, UNSPECIFIED 10/22/2010  Atrial fibrillation ([place]) 10/22/2010   CHEST PAIN-UNSPECIFIED 10/22/2010     Referrals to Alternative Service(s): Referred to Alternative Service(s):   Place:   Date:   Time:    Referred to Alternative Service(s):   Place:   Date:   Time:    Referred to Alternative Service(s):   Place:   Date:   Time:    Referred to Alternative Service(s):   Place:   Date:   Time:     [place] Session

## 2021-04-15 NOTE — ED Triage Notes (Signed)
Per husband patient has been hallucinating, paranoid delusions x6 weeks and that she is about to have a nervous breakdown. Attempted to see her psychiatrist and there are no openings for a few months. Takes prophylactic abx for UTIs, has not had one for a while. Unable to tell me name or DOB in triage, flight of ideas.

## 2021-04-15 NOTE — ED Notes (Signed)
Pt ambulated to bathroom without assistance 

## 2021-04-15 NOTE — ED Notes (Signed)
Pt changed into burgundy scrub pants & gown. Pt belongings bag(2) in [place]: 19-22, Venita Sheffield. Security notified to wand pt.

## 2021-04-15 NOTE — ED Provider Notes (Signed)
[facility] DEPT Provider Note   CSN: [id number] Arrival date & time: 04/15/21  1749     History No chief complaint on file.   [name] is a 70 y.o. female with from past medical history of bipolar 1 disorder, GAD, A. fib, CHF, hypertension, diabetes that presents emerged department today for psychiatric evaluation.  Patient is level 5 caveat due to disorientation at this time.  Husband is able to supplement history.  Patient's husband states that 7 weeks ago patient fell and hit her head, unsure if loss of consciousness.  States that then 6 weeks ago she started having hallucinations, states that he is never experienced this before.  Patient currently is telling me that her cars were stolen, husband states this is not true.  Husband states that she has been having hallucinations and seeing people in the house, states that she has been seeing 3 of him at times.  He states that she is paranoid that someone keeps breaking into the house and stealing cars, patient states that she sees this happen.  Husband states that when this started she went to behavioral health urgent care, who referred her to her family doctor.  He states that the family doctor referred her to neurology and psychiatry, states that they have not been able to see her until the end of September.  He is concerned that she could have a tumor since she has never had this presentation before.  He states that she has been acting more and more confused, has been getting worse.  States that she has been confused for a year has been worsening over the past couple of weeks.  Patient is unable to tell me where she is or why she is here, is only alert to self.  Patient with rambling speech, tangential, hyper focused on people coming into her house.  She denies any pain.  States that she is on a prophylactic UTI medication.  Husband denies any other falls that he knows of.  No substance abuse per husband.  No SI  or HI.  HPI     Past Medical History:  Diagnosis Date   Anxiety    Arthritis    Asthma    Bipolar 1 disorder ([place])    CHF (congestive heart failure) ([place])    Complication of anesthesia    left a bad taste in my mouth it has lasted since january    Depression    Diabetes mellitus    Hernia    umbilical   Hyperlipidemia    Hypertension    Hypothyroidism    Pericarditis, viral 2010 October   Sleep apnea    uses cpap setting of 15    Patient Active Problem List   Diagnosis Date Noted   [name] disorder ([place]) 09/19/2018   GAD (generalized anxiety disorder) 09/19/2018   Panic 09/19/2018   GERD (gastroesophageal reflux disease) 05/20/2014   Abdominal pain, generalized 05/20/2014   Nausea and vomiting 11/15/2012   Diarrhea 11/15/2012   OSA on CPAP 11/15/2012   Bipolar 1 disorder, depressed ([place]) 11/15/2012   Incisional hernia without mention of obstruction or gangrene 07/19/2011   DYSPNEA 10/25/2010   HYPERLIPIDEMIA-MIXED 10/22/2010   OBESITY-MORBID (>100') 10/22/2010   HYPERTENSION, UNSPECIFIED 10/22/2010   Atrial fibrillation ([place]) 10/22/2010   CHEST PAIN-UNSPECIFIED 10/22/2010    Past Surgical History:  Procedure Laterality Date   ABDOMINAL HYSTERECTOMY     APPENDECTOMY     CHOLECYSTECTOMY  1984   open  COLONOSCOPY WITH PROPOFOL N/A 05/20/2014   Procedure: COLONOSCOPY WITH PROPOFOL;  Surgeon: Lear Ng, MD;  Location: [facility];  Service: Endoscopy;  Laterality: N/A;   ESOPHAGOGASTRODUODENOSCOPY (EGD) WITH PROPOFOL N/A 05/20/2014   Procedure: ESOPHAGOGASTRODUODENOSCOPY (EGD) WITH PROPOFOL;  Surgeon: Lear Ng, MD;  Location: [facility];  Service: Endoscopy;  Laterality: N/A;   FOOT SURGERY     bone spurs    KNEE ARTHROSCOPY  09/07/2012   Procedure: ARTHROSCOPY KNEE;  Surgeon: Newt Minion, MD;  Location: [facility];  Service: Orthopedics;  Laterality: Left;  Left Knee Arthroscopy     OB History     Gravida  2   Para  2   Term       Preterm      AB      Living  2      SAB      IAB      Ectopic      Multiple      Live Births              Family History  Problem Relation Age of Onset   Diabetes Mother    Hypertension Mother    Hyperlipidemia Mother    Diabetes Father    Hypertension Father    Hyperlipidemia Father    Hypertension Sister     Social History   Tobacco Use   Smoking status: Never   Smokeless tobacco: Never  Substance Use Topics   Alcohol use: No   Drug use: No    Home Medications Prior to Admission medications   Medication Sig Start Date End Date Taking? Authorizing Provider  acetaminophen (TYLENOL) 325 MG tablet Take 650 mg by mouth every 6 (six) hours as needed for mild pain or headache.   Yes [provider]  amLODipine (NORVASC) 5 MG tablet Take 5 mg by mouth every evening. 01/28/19  Yes [provider]  aspirin 81 MG tablet Take 81 mg by mouth daily.   Yes [provider]  busPIRone (BUSPAR) 30 MG tablet Take 1 tablet (30 mg total) by mouth 2 (two) times daily. 08/28/20  Yes Cottle, Billey Co., MD  cephALEXin (KEFLEX) 250 MG capsule Take 250 mg by mouth at bedtime. 09/28/19  Yes [provider]  famotidine (PEPCID) 20 MG tablet Take 20 mg by mouth in the morning and at bedtime.   Yes [provider]  furosemide (LASIX) 20 MG tablet Take 20 mg by mouth daily as needed (for severe swelling).   Yes [provider]  glimepiride (AMARYL) 1 MG tablet Take 1 mg by mouth daily. 07/27/20  Yes [provider]  lamoTRIgine (LAMICTAL) 150 MG tablet Take 1 tablet (150 mg total) by mouth 2 (two) times daily. Patient taking differently: Take 150 mg by mouth in the morning and at bedtime. 08/28/20  Yes Cottle, Billey Co., MD  levocetirizine (XYZAL) 5 MG tablet Take 5 mg by mouth every evening.   Yes [provider]  levothyroxine (SYNTHROID) 100 MCG tablet Take 100 mcg by mouth daily before breakfast.   Yes [provider]  lisinopril (PRINIVIL,ZESTRIL) 20 MG tablet Take 20 mg by mouth daily.   Yes [provider]  LORazepam (ATIVAN) 0.5 MG tablet TAKE 1 TABLET IN THE MORNING AND TAKE 2 and 1/2 TABLETS AT NIGHT Patient taking differently: Take 0.5-1.25 mg by mouth See admin instructions. Take 0.5 mg by mouth in the morning and 1.25 mg at bedtime 03/29/21  Yes  Cottle, Billey Co., MD  meclizine (ANTIVERT) 25 MG tablet Take 1 tablet (25 mg total) by mouth 3 (three) times daily as needed for dizziness. 06/12/20  Yes Dorie Rank, MD  meloxicam (MOBIC) 15 MG tablet Take 15 mg by mouth daily. 08/27/19  Yes [provider]  metoprolol succinate (TOPROL-XL) 50 MG 24 hr tablet Take 50 mg by mouth every evening. Take with or immediately following a meal.   Yes [provider]  phenazopyridine (PYRIDIUM) 200 MG tablet Take 200 mg by mouth 3 (three) times daily as needed for pain.  12/10/15  Yes [provider]  polyethylene glycol (MIRALAX / GLYCOLAX) packet Take 17 g by mouth 2 (two) times daily as needed for mild constipation (MIX AS DIRECTED AND DRINK).   Yes [provider]  potassium chloride SA (K-DUR,KLOR-CON) 20 MEQ tablet Take 20 mEq by mouth daily.   Yes [provider]  QUEtiapine (SEROQUEL XR) 400 MG 24 hr tablet Take 2 tablets (800 mg total) by mouth at bedtime. 08/28/20  Yes Cottle, Billey Co., MD  simvastatin (ZOCOR) 40 MG tablet Take 40 mg by mouth every evening.   Yes [provider]  Vitamin D, Ergocalciferol, (DRISDOL) 1.25 MG (50000 UNIT) CAPS capsule TAKE 1 CAPSULE (50,000  UNITS TOTAL) BY MOUTH TWICE WEEKLY Patient taking differently: Take 50,000 Units by mouth 2 (two) times a week. 02/11/20  Yes Cottle, Billey Co., MD  diazepam (VALIUM) 5 MG tablet Take 1 tablet (5 mg total) by mouth every 12 (twelve) hours as needed (dizziness). Patient not taking: No sig reported 07/30/20   Isla Pence, MD  diclofenac sodium (VOLTAREN) 1 % GEL Apply  2 g topically 4 (four) times daily. Rub into affected area of foot 2 to 4 times daily Patient not taking: No sig reported 06/03/19   Trula Slade, DPM  esomeprazole (NEXIUM) 40 MG capsule Take 1 capsule (40 mg total) by mouth daily. Patient not taking: No sig reported 02/16/14   Elyn Peers, MD  haloperidol (HALDOL) 2 MG tablet Take 1 tablet (2 mg total) by mouth at bedtime. Patient not taking: No sig reported 03/22/21   Cottle, Billey Co., MD  lidocaine (LIDODERM) 5 % Place 1 patch onto the skin daily. Remove & Discard patch within 12 hours or as directed by MD Patient not taking: No sig reported 02/06/21   Sherwood Gambler, MD  QUEtiapine (SEROQUEL) 200 MG tablet Take 1 tablet (200 mg total) by mouth at bedtime as needed. for sleep Patient not taking: No sig reported 08/28/20   Cottle, Billey Co., MD  risperiDONE (RISPERDAL) 2 MG tablet Take 1 tablet (2 mg total) by mouth at bedtime. Patient not taking: No sig reported 03/13/21   Cottle, Billey Co., MD    Allergies    Maxzide [triamterene-hctz], Celexa [citalopram hydrobromide], Metformin and related, Other, Reglan [metoclopramide], Risperdal [risperidone], Trazodone and nefazodone, Zestril [lisinopril], and Lithium  Review of Systems   Review of Systems  Unable to perform ROS: Mental status change   Physical Exam Updated Vital Signs BP (!) 160/77 (BP Location: Left Arm)   Pulse 71   Temp 98.5 F (36.9 C) (Oral)   Resp 17   SpO2 96%   Physical Exam Constitutional:      General: She is not in acute distress.    Appearance: Normal appearance. She is not ill-appearing, toxic-appearing or diaphoretic.  Cardiovascular:     Rate and Rhythm: Normal rate and regular rhythm.  Pulses: Normal pulses.  Pulmonary:     Effort: Pulmonary effort is normal.     Breath sounds: Normal breath sounds.  Musculoskeletal:        General: Normal range of motion.  Skin:    General: Skin is warm and dry.     Capillary Refill: Capillary refill  takes less than 2 seconds.  Neurological:     General: No focal deficit present.     Mental Status: She is alert and oriented to person, place, and time.  Psychiatric:        Attention and Perception: She is inattentive.        Mood and Affect: Affect is labile and flat.        Speech: Speech is rapid and pressured and tangential.        Behavior: Behavior is cooperative.        Thought Content: Thought content is paranoid and delusional.    ED Results / Procedures / Treatments   Labs (all labs ordered are listed, but only abnormal results are displayed) Labs Reviewed  RAPID URINE DRUG SCREEN, HOSP PERFORMED - Abnormal; Notable for the following components:      Result Value   Benzodiazepines POSITIVE (*)    All other components within normal limits  URINALYSIS, ROUTINE W REFLEX MICROSCOPIC - Abnormal; Notable for the following components:   Leukocytes,Ua MODERATE (*)    All other components within normal limits  COMPREHENSIVE METABOLIC PANEL - Abnormal; Notable for the following components:   Glucose, Bld 162 (*)    Calcium 11.1 (*)    All other components within normal limits  SALICYLATE LEVEL - Abnormal; Notable for the following components:   Salicylate Lvl [id number] (*)    All other components within normal limits  ACETAMINOPHEN LEVEL - Abnormal; Notable for the following components:   Acetaminophen (Tylenol), Serum <10 (*)    All other components within normal limits  RESP PANEL BY RT-PCR (FLU A&B, COVID) ARPGX2  ETHANOL  CBC WITH DIFFERENTIAL/PLATELET  AMMONIA    EKG EKG Interpretation  Date/Time:  Thursday April 15 2021 18:58:20 EDT Ventricular Rate:  71 PR Interval:  204 QRS Duration: 86 QT Interval:  392 QTC Calculation: 425 R Axis:   72 Text Interpretation: Normal sinus rhythm Low voltage QRS Cannot rule out Anterior infarct , age undetermined Abnormal ECG Confirmed by Dene Gentry [phone]) on 04/15/2021 7:50:14 PM  Radiology CT Head Wo Contrast  Result  Date: 04/15/2021 CLINICAL DATA:  Delirium, hallucinations EXAM: CT HEAD WITHOUT CONTRAST TECHNIQUE: Contiguous axial images were obtained from the base of the skull through the vertex without intravenous contrast. COMPARISON:  12/20/2020 FINDINGS: Brain: No acute intracranial abnormality. Specifically, no hemorrhage, hydrocephalus, mass lesion, acute infarction, or significant intracranial injury. Vascular: No hyperdense vessel or unexpected calcification. Skull: No acute calvarial abnormality. Sinuses/Orbits: No acute findings Other: None IMPRESSION: No acute intracranial abnormality. Electronically Signed   By: Rolm Baptise M.D.   On: 04/15/2021 18:55    Procedures Procedures   Medications Ordered in ED Medications  acetaminophen (TYLENOL) tablet 650 mg (has no administration in time range)  amLODipine (NORVASC) tablet 5 mg (has no administration in time range)  aspirin chewable tablet 81 mg (has no administration in time range)  busPIRone (BUSPAR) tablet 30 mg (has no administration in time range)  cephALEXin (KEFLEX) capsule 250 mg (has no administration in time range)  famotidine (PEPCID) tablet 20 mg (has no administration in time range)  furosemide (LASIX) tablet 20 mg (has  no administration in time range)  haloperidol (HALDOL) tablet 2 mg (has no administration in time range)  lamoTRIgine (LAMICTAL) tablet 150 mg (has no administration in time range)  levothyroxine (SYNTHROID) tablet 100 mcg (has no administration in time range)  lisinopril (ZESTRIL) tablet 20 mg (has no administration in time range)  LORazepam (ATIVAN) tablet 0.5-1.25 mg (has no administration in time range)  meclizine (ANTIVERT) tablet 25 mg (has no administration in time range)  meloxicam (MOBIC) tablet 15 mg (has no administration in time range)  metoprolol succinate (TOPROL-XL) 24 hr tablet 50 mg (has no administration in time range)  phenazopyridine (PYRIDIUM) tablet 200 mg (has no administration in time range)   polyethylene glycol (MIRALAX / GLYCOLAX) packet 17 g (has no administration in time range)  potassium chloride SA (KLOR-CON) CR tablet 20 mEq (has no administration in time range)  QUEtiapine (SEROQUEL XR) 24 hr tablet 800 mg (has no administration in time range)  simvastatin (ZOCOR) tablet 40 mg (has no administration in time range)  glimepiride (AMARYL) tablet 1 mg (has no administration in time range)  LORazepam (ATIVAN) injection 1 mg (1 mg Intravenous Given 04/15/21 1917)    ED Course  I have reviewed the triage vital signs and the nursing notes.  Pertinent labs & imaging results that were available during my care of the patient were reviewed by me and considered in my medical decision making (see chart for details).    MDM Rules/Calculators/A&P                          Patient presents to the [facility] department today for hallucinations for the past 6 weeks, has been worsening.  Patient appears confused on my exam, with tangential thoughts.  This is most likely psychiatric related, however will obtain basic labs for altered mental status.  Patient denies any SI or HI.  Work-up today unremarkable with normal UA, CBC, CMP, UDS does show some benzodiazepines.  CT head without any abnormalities.  Patient has been medically cleared and awaiting psych evaluation.  Psych disposition recommending inpatient Geri psych.  Patient will be placed as provider Default.  Home meds have been ordered.  Final Clinical Impression(s) / ED Diagnoses Final diagnoses:  Acute psychosis [facility]    Rx / DC Orders ED Discharge Orders     None        [name], PA-C 04/15/21 2311    Valarie Merino, MD 04/17/21 0005

## 2021-04-15 NOTE — ED Notes (Signed)
From [facility] after TTS:  Dante Gang, our PA Margorie John recommends geropsych placement.

## 2021-04-15 NOTE — ED Notes (Signed)
Family at bedside. 

## 2021-04-15 NOTE — ED Notes (Addendum)
Patient calm, cooperative, but confused as to why she is staying here all night. Patients spouse understands that the patient is awaiting geriatric placement at a facility. Patient and spouse understand that patient could be staying in the ED for a while until she is placed. Patient also given sandwich and drink.

## 2021-04-16 ENCOUNTER — Telehealth: Payer: Self-pay | Admitting: Psychiatry

## 2021-04-16 DIAGNOSIS — F3164 Bipolar disorder, current episode mixed, severe, with psychotic features: Secondary | ICD-10-CM | POA: Diagnosis present

## 2021-04-16 DIAGNOSIS — F319 Bipolar disorder, unspecified: Secondary | ICD-10-CM

## 2021-04-16 NOTE — Consult Note (Signed)
[place] Psychiatry Consult   Reason for Consult:  psych consult Referring Physician:  [name], PA-C Patient Identification: [name] MRN:  [id number] Principal Diagnosis: Bipolar affective disorder, mixed, severe, with psychotic behavior ([place]) Diagnosis:  Principal Problem:   Bipolar affective disorder, mixed, severe, with psychotic behavior ([place]) Active Problems:   [name] disorder ([place])   Total Time spent with patient: 20 minutes  Subjective:   [name] is a 70 y.o. female patient admitted with hallucinations, paranoid delusions.  Patient stated several times throughout the assessment unprompted that she "doesn't drive anymore", because "it is safe for me"; endorses driving and getting lost in the past.  Patient presents laying in bed; oriented to self only, confused and disorganized. Unable to complete serial 2's, would not attempt serial 7's. Unable to complete 3 word recall, or interpret proverbs. Unsuccessful in completing clock test. Noted issues word finding.   Patient denies suicidal or homicidal ideations, auditory or visual hallucinations; she does appear to be responding to external/internal stimuli.   Collateral: husband [name] [id number] Husband reports cognitive decline over past 3 years with acute change x 6 weeks ago where she became acutely delusional and paranoid. Endorses decrease in memory 2-3 years ago; stopped driving x1 year ago due to inability to concentrate on road while driving. States patient had testing completed x3 years ago at [facility] where she was ruled out for dementia and noted as medication related; states medication changes were made and patient has been managed on outpatient since. He states patient sleeps "about 20 hours/day"; denies self harm. Endorses extensive history of depression, sleeping 20 hrs/day; denies any history or recent incidents of self harm; x2 inpatient psychiatric admissions including [place],  [facility].    HPI:   [name] is a 70 year old female patient with past psychiatric history of bipolar I, II anxiety, and depression who presented to [facility] with her husband for hallucinations, paranoia x6 weeks. Patient is currently being seen by Dr Clovis Pu for outpatient medications management, last appointment 02/17/21. Per chart review, patient is noted over past 2 years to have increased depression, cognitive and memory issues; recent office visit 02/17/21 to Dr Clovis Pu for anxiety, depression, and poor cognition.   Past Psychiatric History:   -depression  -anxiety  -Bipolar I  -[name]  Risk to Self:   Risk to Others:   Prior Inpatient Therapy:   Prior Outpatient Therapy:    Past Medical History:  Past Medical History:  Diagnosis Date   Anxiety    Arthritis    Asthma    Bipolar 1 disorder ([place])    CHF (congestive heart failure) ([place])    Complication of anesthesia    left a bad taste in my mouth it has lasted since january    Depression    Diabetes mellitus    Hernia    umbilical   Hyperlipidemia    Hypertension    Hypothyroidism    Pericarditis, viral 2010 October   Sleep apnea    uses cpap setting of 15    Past Surgical History:  Procedure Laterality Date   [place]   open   COLONOSCOPY WITH PROPOFOL N/A 05/20/2014   Procedure: COLONOSCOPY WITH PROPOFOL;  Surgeon: Lear Ng, MD;  Location: [facility];  Service: Endoscopy;  Laterality: N/A;   ESOPHAGOGASTRODUODENOSCOPY (EGD) WITH PROPOFOL N/A 05/20/2014   Procedure: ESOPHAGOGASTRODUODENOSCOPY (EGD) WITH PROPOFOL;  Surgeon: Evette Doffing  Aloha Gell, MD;  Location: [facility];  Service: Endoscopy;  Laterality: N/A;   FOOT SURGERY     bone spurs    KNEE ARTHROSCOPY  09/07/2012   Procedure: ARTHROSCOPY KNEE;  Surgeon: Newt Minion, MD;  Location: [facility];  Service: Orthopedics;  Laterality: Left;  Left Knee Arthroscopy   Family History:  Family  History  Problem Relation Age of Onset   Diabetes Mother    Hypertension Mother    Hyperlipidemia Mother    Diabetes Father    Hypertension Father    Hyperlipidemia Father    Hypertension Sister    Family Psychiatric  History: not noted Social History:  Social History   Substance and Sexual Activity  Alcohol Use No     Social History   Substance and Sexual Activity  Drug Use No    Social History   Socioeconomic History   Marital status: Married    Spouse name: Not on file   Number of children: Not on file   Years of education: Not on file   Highest education level: Not on file  Occupational History   Not on file  Tobacco Use   Smoking status: Never   Smokeless tobacco: Never  Substance and Sexual Activity   Alcohol use: No   Drug use: No   Sexual activity: Not on file  Other Topics Concern   Not on file  Social History Narrative   Not on file   Social Determinants of Health   Financial Resource Strain: Not on file  Food Insecurity: Not on file  Transportation Needs: Not on file  Physical Activity: Not on file  Stress: Not on file  Social Connections: Not on file   Additional Social History:    Allergies:   Allergies  Allergen Reactions   Maxzide [Triamterene-Hctz] Other (See Comments)    Hurt patient's kidneys   Celexa [Citalopram Hydrobromide] Nausea Only   Metformin And Related Nausea And Vomiting   Other Nausea And Vomiting and Other (See Comments)    Unnamed medication hurt the patient's kidneys   Reglan [Metoclopramide] Nausea Only   Risperdal [Risperidone] Nausea Only and Other (See Comments)    Also caused hallucinations   Trazodone And Nefazodone Nausea Only   Zestril [Lisinopril] Nausea Only   Lithium Palpitations and Other (See Comments)    Shakes and delusional pt started seeing things      Labs:  Results for orders placed or performed during the hospital encounter of 04/15/21 (from the past 48 hour(s))  Rapid urine drug screen  (hospital performed)     Status: Abnormal   Collection Time: 04/15/21  6:13 PM  Result Value Ref Range   Opiates NONE DETECTED NONE DETECTED   Cocaine NONE DETECTED NONE DETECTED   Benzodiazepines POSITIVE (A) NONE DETECTED   Amphetamines NONE DETECTED NONE DETECTED   Tetrahydrocannabinol NONE DETECTED NONE DETECTED   Barbiturates NONE DETECTED NONE DETECTED    Comment: (NOTE) DRUG SCREEN FOR MEDICAL PURPOSES ONLY.  IF CONFIRMATION IS NEEDED FOR ANY PURPOSE, NOTIFY LAB WITHIN 5 DAYS.  LOWEST DETECTABLE LIMITS FOR URINE DRUG SCREEN Drug Class                     Cutoff (ng/mL) Amphetamine and metabolites    1000 Barbiturate and metabolites    200 Benzodiazepine                 [id number] Tricyclics and metabolites     300 Opiates and metabolites  300 Cocaine and metabolites        300 THC                            50 Performed at [facility], [place] [address], [place], [place] [address]   Urinalysis, Routine w reflex microscopic Urine, Clean Catch     Status: Abnormal   Collection Time: 04/15/21  6:13 PM  Result Value Ref Range   Color, Urine YELLOW YELLOW   APPearance CLEAR CLEAR   Specific Gravity, Urine 1.017 1.005 - 1.030   pH 6.0 5.0 - 8.0   Glucose, UA NEGATIVE NEGATIVE mg/dL   Hgb urine dipstick NEGATIVE NEGATIVE   Bilirubin Urine NEGATIVE NEGATIVE   Ketones, ur NEGATIVE NEGATIVE mg/dL   Protein, ur NEGATIVE NEGATIVE mg/dL   Nitrite NEGATIVE NEGATIVE   Leukocytes,Ua MODERATE (A) NEGATIVE   RBC / HPF 0-5 0 - 5 RBC/hpf   WBC, UA 6-10 0 - 5 WBC/hpf   Bacteria, UA NONE SEEN NONE SEEN   Squamous Epithelial / LPF 0-5 0 - 5   Mucus PRESENT     Comment: Performed at [facility], [place] [address], [place], [place] [address]  Comprehensive metabolic panel     Status: Abnormal   Collection Time: 04/15/21  6:31 PM  Result Value Ref Range   Sodium 143 135 - 145 mmol/L   Potassium 4.2 3.5 - 5.1 mmol/L   Chloride 106 98 - 111 mmol/L    CO2 27 22 - 32 mmol/L   Glucose, Bld 162 (H) 70 - 99 mg/dL    Comment: Glucose reference range applies only to samples taken after fasting for at least 8 hours.   BUN 15 8 - 23 mg/dL   Creatinine, Ser 0.91 0.44 - 1.00 mg/dL   Calcium 11.1 (H) 8.9 - 10.3 mg/dL   Total Protein 7.5 6.5 - 8.1 g/dL   Albumin 4.2 3.5 - 5.0 g/dL   AST 26 15 - 41 U/L   ALT 21 0 - 44 U/L   Alkaline Phosphatase 104 38 - 126 U/L   Total Bilirubin 0.7 0.3 - 1.2 mg/dL   GFR, Estimated >60 >60 mL/min    Comment: (NOTE) Calculated using the CKD-EPI Creatinine Equation (2021)    Anion gap 10 5 - 15    Comment: Performed at [facility], [place] [address] [place], [place] [address]  Ethanol     Status: None   Collection Time: 04/15/21  6:31 PM  Result Value Ref Range   Alcohol, Ethyl (B) <10 <10 mg/dL    Comment: (NOTE) Lowest detectable limit for serum alcohol is 10 mg/dL.  For medical purposes only. Performed at [facility], [place] [address], [place], [place] [address]   CBC with Diff     Status: None   Collection Time: 04/15/21  6:31 PM  Result Value Ref Range   WBC 5.7 4.0 - 10.5 K/uL   RBC 4.37 3.87 - 5.11 MIL/uL   Hemoglobin 14.1 12.0 - 15.0 g/dL   HCT 41.0 36.0 - 46.0 %   MCV 93.8 80.0 - 100.0 fL   MCH 32.3 26.0 - 34.0 pg   MCHC 34.4 30.0 - 36.0 g/dL   RDW 13.7 11.5 - 15.5 %   Platelets 221 150 - 400 K/uL   nRBC 0.0 0.0 - 0.2 %   Neutrophils Relative % 70 %   Neutro Abs 3.9 1.7 -  7.7 K/uL   Lymphocytes Relative 20 %   Lymphs Abs 1.2 0.7 - 4.0 K/uL   Monocytes Relative 7 %   Monocytes Absolute 0.4 0.1 - 1.0 K/uL   Eosinophils Relative 1 %   Eosinophils Absolute 0.1 0.0 - 0.5 K/uL   Basophils Relative 2 %   Basophils Absolute 0.1 0.0 - 0.1 K/uL   Immature Granulocytes 0 %   Abs Immature Granulocytes 0.01 0.00 - 0.07 K/uL    Comment: Performed at [facility], [place] [address], [place], [place] [id number]  Salicylate level     Status:  Abnormal   Collection Time: 04/15/21  6:31 PM  Result Value Ref Range   Salicylate Lvl [id number] (L) 7.0 - 30.0 mg/dL    Comment: Performed at [facility], [place] [address], [place], [place] [address]  Acetaminophen level     Status: Abnormal   Collection Time: 04/15/21  6:31 PM  Result Value Ref Range   Acetaminophen (Tylenol), Serum <10 (L) 10 - 30 ug/mL    Comment: (NOTE) Therapeutic concentrations vary significantly. A range of 10-30 ug/mL  may be an effective concentration for many patients. However, some  are best treated at concentrations outside of this range. Acetaminophen concentrations >150 ug/mL at 4 hours after ingestion  and >50 ug/mL at 12 hours after ingestion are often associated with  toxic reactions.  Performed at [facility], [place] [address], [place], [place] [address]   Ammonia     Status: None   Collection Time: 04/15/21  6:57 PM  Result Value Ref Range   Ammonia 23 9 - 35 umol/L    Comment: Performed at [facility], [place] [address], [place], [place] [address]  Resp Panel by RT-PCR (Flu A&B, Covid) Nasopharyngeal Swab     Status: None   Collection Time: 04/15/21  7:01 PM   Specimen: Nasopharyngeal Swab; Nasopharyngeal(NP) swabs in vial transport medium  Result Value Ref Range   SARS Coronavirus 2 by RT PCR NEGATIVE NEGATIVE    Comment: (NOTE) SARS-CoV-2 target nucleic acids are NOT DETECTED.  The SARS-CoV-2 RNA is generally detectable in upper respiratory specimens during the acute phase of infection. The lowest concentration of SARS-CoV-2 viral copies this assay can detect is 138 copies/mL. A negative result does not preclude SARS-Cov-2 infection and should not be used as the sole basis for treatment or other patient management decisions. A negative result may occur with  improper specimen collection/handling, submission of specimen other than nasopharyngeal swab, presence of viral mutation(s) within  the areas targeted by this assay, and inadequate number of viral copies(<138 copies/mL). A negative result must be combined with clinical observations, patient history, and epidemiological information. The expected result is Negative.  Fact Sheet for Patients:  EntrepreneurPulse.com.au  Fact Sheet for Healthcare Providers:  IncredibleEmployment.be  This test is no t yet approved or cleared by the [place] FDA and  has been authorized for detection and/or diagnosis of SARS-CoV-2 by FDA under an Emergency Use Authorization (EUA). This EUA will remain  in effect (meaning this test can be used) for the duration of the COVID-19 declaration under Section 564(b)(1) of the Act, 21 U.S.C.section 360bbb-3(b)(1), unless the authorization is terminated  or revoked sooner.       Influenza A by PCR NEGATIVE NEGATIVE   Influenza B by PCR NEGATIVE NEGATIVE    Comment: (NOTE) The Xpert Xpress SARS-CoV-2/FLU/RSV plus assay is intended as an aid in the diagnosis of influenza from  Nasopharyngeal swab specimens and should not be used as a sole basis for treatment. Nasal washings and aspirates are unacceptable for Xpert Xpress SARS-CoV-2/FLU/RSV testing.  Fact Sheet for Patients: EntrepreneurPulse.com.au  Fact Sheet for Healthcare Providers: IncredibleEmployment.be  This test is not yet approved or cleared by the [place] FDA and has been authorized for detection and/or diagnosis of SARS-CoV-2 by FDA under an Emergency Use Authorization (EUA). This EUA will remain in effect (meaning this test can be used) for the duration of the COVID-19 declaration under Section 564(b)(1) of the Act, 21 U.S.C. section 360bbb-3(b)(1), unless the authorization is terminated or revoked.  Performed at [facility], [place] [address] [place], [place] [address]     Current Facility-Administered Medications   Medication Dose Route Frequency Provider Last Rate Last Admin   acetaminophen (TYLENOL) tablet 650 mg  650 mg Oral Q6H PRN [name], PA-C       amLODipine (NORVASC) tablet 5 mg  5 mg Oral QPM [name], PA-C       aspirin chewable tablet 81 mg  81 mg Oral Daily Patel, Shalyn, PA-C       busPIRone (BUSPAR) tablet 30 mg  30 mg Oral BID [name], PA-C   30 mg at 04/15/21 2325   cephALEXin (KEFLEX) capsule 250 mg  250 mg Oral QHS [name], PA-C   250 mg at 04/15/21 2325   famotidine (PEPCID) tablet 20 mg  20 mg Oral Daily [name], PA-C       furosemide (LASIX) tablet 20 mg  20 mg Oral Daily PRN [name], PA-C       glimepiride (AMARYL) tablet 1 mg  1 mg Oral Daily [name], PA-C       lamoTRIgine (LAMICTAL) tablet 150 mg  150 mg Oral BID [name], PA-C   150 mg at 04/15/21 2326   levothyroxine (SYNTHROID) tablet 100 mcg  100 mcg Oral Q0600 Valarie Merino, MD   100 mcg at 04/16/21 0509   lisinopril (ZESTRIL) tablet 20 mg  20 mg Oral Daily [name], PA-C       loratadine (CLARITIN) tablet 10 mg  10 mg Oral q1800 Valarie Merino, MD       meloxicam [facility]) tablet 15 mg  15 mg Oral Daily [name], PA-C       metoprolol succinate (TOPROL-XL) 24 hr tablet 50 mg  50 mg Oral QPM Patel, Shalyn, PA-C       phenazopyridine (PYRIDIUM) tablet 200 mg  200 mg Oral TID PRN [name], PA-C       polyethylene glycol (MIRALAX / GLYCOLAX) packet 17 g  17 g Oral BID PRN [name], PA-C       potassium chloride SA (KLOR-CON) CR tablet 20 mEq  20 mEq Oral Daily [name], PA-C       QUEtiapine (SEROQUEL XR) 24 hr tablet 800 mg  800 mg Oral QHS [name], PA-C   800 mg at 04/15/21 2326   simvastatin (ZOCOR) tablet 40 mg  40 mg Oral QPM [name], PA-C       Current Outpatient Medications  Medication Sig Dispense Refill   acetaminophen (TYLENOL) 325 MG tablet Take 650 mg by mouth every 6 (six) hours as needed for mild pain or headache.      amLODipine (NORVASC) 5 MG tablet Take 5 mg by mouth every evening.     aspirin 81 MG tablet Take 81 mg by mouth daily.  busPIRone (BUSPAR) 30 MG tablet Take 1 tablet (30 mg total) by mouth 2 (two) times daily. 180 tablet 3   cephALEXin (KEFLEX) 250 MG capsule Take 250 mg by mouth at bedtime.     famotidine (PEPCID) 20 MG tablet Take 20 mg by mouth in the morning and at bedtime.     furosemide (LASIX) 20 MG tablet Take 20 mg by mouth daily as needed (for severe swelling).     glimepiride (AMARYL) 1 MG tablet Take 1 mg by mouth daily.     lamoTRIgine (LAMICTAL) 150 MG tablet Take 1 tablet (150 mg total) by mouth 2 (two) times daily. (Patient taking differently: Take 150 mg by mouth in the morning and at bedtime.) 180 tablet 3   levocetirizine (XYZAL) 5 MG tablet Take 5 mg by mouth every evening.     levothyroxine (SYNTHROID) 100 MCG tablet Take 100 mcg by mouth daily before breakfast.     lisinopril (PRINIVIL,ZESTRIL) 20 MG tablet Take 20 mg by mouth daily.     LORazepam (ATIVAN) 0.5 MG tablet TAKE 1 TABLET IN THE MORNING AND TAKE 2 and 1/2 TABLETS AT NIGHT (Patient taking differently: Take 0.5-1.25 mg by mouth See admin instructions. Take 0.5 mg by mouth in the morning and 1.25 mg at bedtime) 105 tablet 1   meclizine (ANTIVERT) 25 MG tablet Take 1 tablet (25 mg total) by mouth 3 (three) times daily as needed for dizziness. 30 tablet 0   meloxicam (MOBIC) 15 MG tablet Take 15 mg by mouth daily.     metoprolol succinate (TOPROL-XL) 50 MG 24 hr tablet Take 50 mg by mouth every evening. Take with or immediately following a meal.     phenazopyridine (PYRIDIUM) 200 MG tablet Take 200 mg by mouth 3 (three) times daily as needed for pain.      polyethylene glycol (MIRALAX / GLYCOLAX) packet Take 17 g by mouth 2 (two) times daily as needed for mild constipation (MIX AS DIRECTED AND DRINK).     potassium chloride SA (K-DUR,KLOR-CON) 20 MEQ tablet Take 20 mEq by mouth daily.     QUEtiapine (SEROQUEL XR) 400  MG 24 hr tablet Take 2 tablets (800 mg total) by mouth at bedtime. 180 tablet 3   simvastatin (ZOCOR) 40 MG tablet Take 40 mg by mouth every evening.     Vitamin D, Ergocalciferol, (DRISDOL) 1.25 MG (50000 UNIT) CAPS capsule TAKE 1 CAPSULE (50,000  UNITS TOTAL) BY MOUTH TWICE WEEKLY (Patient taking differently: Take 50,000 Units by mouth 2 (two) times a week.) 26 capsule 3   diazepam (VALIUM) 5 MG tablet Take 1 tablet (5 mg total) by mouth every 12 (twelve) hours as needed (dizziness). (Patient not taking: No sig reported) 10 tablet 0   diclofenac sodium (VOLTAREN) 1 % GEL Apply 2 g topically 4 (four) times daily. Rub into affected area of foot 2 to 4 times daily (Patient not taking: No sig reported) 100 g 2   esomeprazole (NEXIUM) 40 MG capsule Take 1 capsule (40 mg total) by mouth daily. (Patient not taking: No sig reported) 30 capsule 0   haloperidol (HALDOL) 2 MG tablet Take 1 tablet (2 mg total) by mouth at bedtime. (Patient not taking: No sig reported) 30 tablet 0   lidocaine (LIDODERM) 5 % Place 1 patch onto the skin daily. Remove & Discard patch within 12 hours or as directed by MD (Patient not taking: No sig reported) 6 patch 0   QUEtiapine (SEROQUEL) 200 MG tablet Take 1 tablet (  200 mg total) by mouth at bedtime as needed. for sleep (Patient not taking: No sig reported) 90 tablet 3   risperiDONE (RISPERDAL) 2 MG tablet Take 1 tablet (2 mg total) by mouth at bedtime. (Patient not taking: No sig reported) 30 tablet 0    Musculoskeletal: Strength & Muscle Tone: within normal limits Gait & Station:  unable to assess at this time Patient leans: N/A  Psychiatric Specialty Exam:  Presentation  General Appearance: Casual  Eye Contact:Good  Speech:Clear and Coherent  Speech Volume:Normal  Handedness:Right   Mood and Affect  Mood:Euthymic  Affect:Non-Congruent   Thought Process  Thought Processes:Disorganized  Descriptions of  Associations:Loose  Orientation:Partial  Thought Content:Scattered  History of Schizophrenia/Schizoaffective disorder:No  Duration of Psychotic Symptoms:N/A  Hallucinations:Hallucinations: None Ideas of Reference:None  Suicidal Thoughts:Suicidal Thoughts: No Homicidal Thoughts:Homicidal Thoughts: No  Sensorium  Memory:Immediate Poor; Recent Fair; Remote Fair  Judgment:Poor  Insight:Shallow   Executive Functions  Concentration:Poor  Attention Span:Fair  Recall:Poor  Fund of Knowledge:Poor  Language:Fair   Psychomotor Activity  Psychomotor Activity: Psychomotor Activity: Normal  Assets  Assets:Desire for Improvement; Financial Resources/Insurance; Housing; Intimacy; Transportation; Social Support; Resilience   Sleep  Sleep: Sleep: Poor  Physical Exam: Physical Exam Vitals and nursing note reviewed.  Constitutional:      Appearance: She is obese.  HENT:     Head: Normocephalic.     Nose: Nose normal.     Mouth/Throat:     Mouth: Mucous membranes are moist.     Pharynx: Oropharynx is clear.  Eyes:     Pupils: Pupils are equal, round, and reactive to light.  Cardiovascular:     Rate and Rhythm: Normal rate.     Pulses: Normal pulses.  Pulmonary:     Effort: Pulmonary effort is normal.  Musculoskeletal:        General: Normal range of motion.     Cervical back: Normal range of motion.  Skin:    General: Skin is warm.  Neurological:     Mental Status: She is disoriented.  Psychiatric:        Attention and Perception: She is inattentive.        Mood and Affect: Mood is depressed. Affect is flat and inappropriate.        Speech: Speech is tangential.        Behavior: Behavior is cooperative.        Thought Content: Thought content is paranoid and delusional.        Cognition and Memory: Memory is impaired.        Judgment: Judgment is impulsive.   Review of Systems  Psychiatric/Behavioral:  Negative for substance abuse and suicidal ideas. The  patient has insomnia.   All other systems reviewed and are negative. Blood pressure (!) 164/83, pulse 77, temperature 97.7 F (36.5 C), temperature source Oral, resp. rate 20, SpO2 96 %. There is no height or weight on file to calculate BMI.  Treatment Plan Summary: Daily contact with patient to assess and evaluate symptoms and progress in treatment, Medication management, and Plan seek gero-psych psychiatric inpatient hospitalization.   Disposition: Recommend psychiatric Inpatient admission when medically cleared. Supportive therapy provided about ongoing stressors. Discussed crisis plan, support from social network, calling 911, coming to the Emergency Department, and calling Suicide Hotline.  Inda Merlin, NP 04/16/2021 12:55 PM

## 2021-04-16 NOTE — Telephone Encounter (Signed)
Pt.'s husband called.  She is currently in [facility] waiting for an inpatient pyschiatric bed.  Pt advised her husband that Dr Clovis Pu told her to stop taking the Lorazepam.  He said he is not aware of that chang in her prescription.  He would like someone to call him and advise if there were any changes to her meds so he stays aware and can let the hospital know if needed.

## 2021-04-16 NOTE — ED Notes (Signed)
Patient is cooperative and is resting comfortably at this time. Denies any complaints.

## 2021-04-16 NOTE — ED Notes (Signed)
Patient ambulated to restroom w/ no assistance.

## 2021-04-16 NOTE — ED Notes (Signed)
Patient cannot get settled, continuously asking where husband is, why she is here. Patient reminded that she is safe here and waiting on placement in a facility. Patient tearful about her dogs at home.

## 2021-04-16 NOTE — ED Notes (Addendum)
Per case worker:  Pt has been accepted to [facility] [phone] tomorrow 04/17/21 after 0800. Accepting Physician is Dr. Phillip Heal pt will admit to [facility] unit and the phone number for report is [phone].

## 2021-04-16 NOTE — BH Assessment (Addendum)
[place] Assessment Progress Note   Per Oneida Alar, NP, this pt continues to require psychiatric hospitalization at a facility providing specialty care for geriatric patients this time.  The following facilities have been contacted to seek placement for this pt, with results as noted:   Beds available, information sent, decision pending: [facility]   At capacity: [facility] (currently under [employer]) [facility] (unit currently closed) [facility] (unit currently closed)   If this voluntary pt is accepted to a facility, please discuss disposition with pt to be sure that she agrees to the plan.  If a facility agrees to accept pt and the plan changes in any way please call the facility to inform them of the change.  Final disposition is pending as of this writing.   Jalene Mullet, [place] Coordinator [phone]

## 2021-04-16 NOTE — Progress Notes (Addendum)
Pt has been accepted to [facility], [place] tomorrow 04/17/21 after 0800. Accepting Physician is Dr. Phillip Heal pt will admit to [facility] unit and the phone number for report is [phone]. CSW provided phone number to [facility] to provide information to RN [phone] option 1. CSW communicated with Galaxi E. Redmond Pulling, RN via secure chat and advised to setup transportation.   Benjaman Kindler, MSW, [facility] 04/16/2021 5:12 PM

## 2021-04-16 NOTE — ED Notes (Signed)
Pt confused and restless. RN aware.

## 2021-04-16 NOTE — Telephone Encounter (Signed)
Informed husband he did not make any med changes.

## 2021-04-16 NOTE — ED Notes (Signed)
Patient given lunch tray and is feeding self independently.

## 2021-04-16 NOTE — ED Notes (Signed)
Report given to [place], Therapist, sports at [facility]

## 2021-04-17 DIAGNOSIS — E039 Hypothyroidism, unspecified: Secondary | ICD-10-CM | POA: Diagnosis not present

## 2021-04-17 DIAGNOSIS — E785 Hyperlipidemia, unspecified: Secondary | ICD-10-CM | POA: Diagnosis not present

## 2021-04-17 DIAGNOSIS — Z20822 Contact with and (suspected) exposure to covid-19: Secondary | ICD-10-CM | POA: Diagnosis not present

## 2021-04-17 DIAGNOSIS — G8929 Other chronic pain: Secondary | ICD-10-CM | POA: Diagnosis not present

## 2021-04-17 DIAGNOSIS — I509 Heart failure, unspecified: Secondary | ICD-10-CM | POA: Diagnosis not present

## 2021-04-17 DIAGNOSIS — I10 Essential (primary) hypertension: Secondary | ICD-10-CM | POA: Diagnosis not present

## 2021-04-17 DIAGNOSIS — I110 Hypertensive heart disease with heart failure: Secondary | ICD-10-CM | POA: Diagnosis not present

## 2021-04-17 DIAGNOSIS — K219 Gastro-esophageal reflux disease without esophagitis: Secondary | ICD-10-CM | POA: Diagnosis not present

## 2021-04-17 DIAGNOSIS — E119 Type 2 diabetes mellitus without complications: Secondary | ICD-10-CM | POA: Diagnosis not present

## 2021-04-17 DIAGNOSIS — R410 Disorientation, unspecified: Secondary | ICD-10-CM | POA: Diagnosis not present

## 2021-04-17 DIAGNOSIS — F23 Brief psychotic disorder: Secondary | ICD-10-CM | POA: Diagnosis not present

## 2021-04-17 DIAGNOSIS — R45851 Suicidal ideations: Secondary | ICD-10-CM | POA: Diagnosis not present

## 2021-04-17 DIAGNOSIS — J45909 Unspecified asthma, uncomplicated: Secondary | ICD-10-CM | POA: Diagnosis not present

## 2021-04-17 DIAGNOSIS — F419 Anxiety disorder, unspecified: Secondary | ICD-10-CM | POA: Diagnosis not present

## 2021-04-17 DIAGNOSIS — F29 Unspecified psychosis not due to a substance or known physiological condition: Secondary | ICD-10-CM | POA: Diagnosis not present

## 2021-04-17 DIAGNOSIS — F3164 Bipolar disorder, current episode mixed, severe, with psychotic features: Secondary | ICD-10-CM | POA: Diagnosis not present

## 2021-04-17 DIAGNOSIS — F319 Bipolar disorder, unspecified: Secondary | ICD-10-CM | POA: Diagnosis not present

## 2021-04-17 NOTE — ED Provider Notes (Signed)
Accepted to [facility] with Dr. Phillip Heal.  Stable throughout my care.   Lennice Sites, DO 04/17/21 [phone]

## 2021-04-17 NOTE — ED Notes (Signed)
PT departs with [employer]

## 2021-05-11 ENCOUNTER — Telehealth (INDEPENDENT_AMBULATORY_CARE_PROVIDER_SITE_OTHER): Payer: Medicare HMO | Admitting: Psychiatry

## 2021-05-11 ENCOUNTER — Encounter: Payer: Self-pay | Admitting: Psychiatry

## 2021-05-11 DIAGNOSIS — F3164 Bipolar disorder, current episode mixed, severe, with psychotic features: Secondary | ICD-10-CM | POA: Diagnosis not present

## 2021-05-11 DIAGNOSIS — F05 Delirium due to known physiological condition: Secondary | ICD-10-CM

## 2021-05-11 DIAGNOSIS — F4001 Agoraphobia with panic disorder: Secondary | ICD-10-CM

## 2021-05-11 DIAGNOSIS — F5105 Insomnia due to other mental disorder: Secondary | ICD-10-CM

## 2021-05-11 DIAGNOSIS — F411 Generalized anxiety disorder: Secondary | ICD-10-CM

## 2021-05-11 DIAGNOSIS — H8113 Benign paroxysmal vertigo, bilateral: Secondary | ICD-10-CM | POA: Diagnosis not present

## 2021-05-11 DIAGNOSIS — F40240 Claustrophobia: Secondary | ICD-10-CM | POA: Diagnosis not present

## 2021-05-11 DIAGNOSIS — R7989 Other specified abnormal findings of blood chemistry: Secondary | ICD-10-CM | POA: Diagnosis not present

## 2021-05-11 DIAGNOSIS — G3184 Mild cognitive impairment, so stated: Secondary | ICD-10-CM

## 2021-05-11 MED ORDER — SIMVASTATIN 40 MG PO TABS: 40.0000 mg | ORAL_TABLET | Freq: Every evening | ORAL | 0 refills | Status: AC

## 2021-05-11 MED ORDER — QUETIAPINE FUMARATE 300 MG PO TABS: 150.0000 mg | ORAL_TABLET | Freq: Every day | ORAL | 0 refills | Status: DC

## 2021-05-11 NOTE — Progress Notes (Signed)
[name] [id number] 1951-03-20 70 y.o.   Video Visit via My Chart  I connected with pt by My Chart and verified that I am speaking with the correct person using two identifiers.   I discussed the limitations, risks, security and privacy concerns of performing an evaluation and management service by My Chart  and the availability of in person appointments. I also discussed with the patient that there may be a patient responsible charge related to this service. The patient expressed understanding and agreed to proceed.  I discussed the assessment and treatment plan with the patient. The patient was provided an opportunity to ask questions and all were answered. The patient agreed with the plan and demonstrated an understanding of the instructions.   The patient was advised to call back or seek an in-person evaluation if the symptoms worsen or if the condition fails to improve as anticipated.  I provided 30 minutes of video time during this encounter.  The patient was located at home and the provider was located office. Session from 245 until 315  Subjective:   Patient ID:  [name] is a 70 y.o. (DOB Dec 18, 1950) female.  Chief Complaint:  Chief Complaint  Patient presents with   Follow-up   [name] disorder    Hallucinations   Depression   Anxiety    Anxiety Symptoms include confusion, dizziness and nervous/anxious behavior. Patient reports no chest pain, decreased concentration or suicidal ideas.       [name] presents to the office today for follow-up of anxiety and depression and poor cognition.  At her visit October 12, 2018.  Because of her poor cognition which did seem to get even worse lately with her memory and given a negative unremarkable medical work-up by her primary care doctor the decision was made to change her from Xanax which she has been on for years to lorazepam.  Lorazepam often has less cognitive side effects than does alprazolam.  She  had a lot of difficulty with headaches and insomnia with the transition.  She called several times in that transition.  Her husband reported that her cognition was better after the transition however.  She was satisfied with the Ativan.  There was an attempt to reduce her lamotrigine to 150 mg also in hopes of improving cognition but it was uncertain at the last visit where there she had actually done so.  visit December 2020 without med changes.  She had continued to do cognitively better off alprazolam and on lorazepam.  seen March 2021.   Better than December visit.  Feeling good and less down.  Adjusting time of med helped excessively sleep.  Brief hypomanic episode resolved where she cleaned all night.  Sleep 10 hours . No med changes.  12/23/19 appt.  Noted: More depression, crying and racing thoughts and distressed to the point of wondering if needed the hospital.  Sleep got worse and needed Seroquel IR 200 also bc went 2 days without sleep.  It worked and helped sleep.  Worry a lot and melancholy this week.  Cries over what happens dealing with her daugher.[name] has prostate and kidney cancer again.  Had surgery June 10, 2019 and she's cried a lot.  He's incontinent and very uncomfortable.  He usually has positive attitude.   Worry over his health and how she'll do if something happens to him.  She's afraid of Covid.  Trusting in God usually.  She realizes she  Needs to drive occasionally  bc of his health.  [name] had prostate CA and lived to 27 yo.  Uncle died of it.  Not markedly depressed. Can't walk much dT plantar fascitis. Also stressed over [name] 68 yo not doing school work and causing problems.  Her father [name] is a bad parent and not helpful.  Stressed over [name] and downs too and they have periodic conflict.   More down and anxious.   Guilt tendency.   Patient reports difficulty with sleep initiation or maintenance in part DT anxiety and racing thoughts.Intermittent  sleep problems with days and nights mixed up.  Not napping. Not manic.[name] Denies appetite disturbance.  Patient reports that energy and motivation have been good.  Patient has some difficulty with concentration.  Chronic memory problems.   Patient denies any suicidal ideation. Plan:   No changes in meds indicated except agree to take the extra Seroquel 200 IR HS for mixed manic sx that are worse right now.  May be having mixed seasonal sx.  02/20/2020 appointment with the following noted: Doing fairly good except chronic worry over [name].  Some days cries a lot over it.  D having a hard time lately.  Still easily stressed but not more than usual. Stopped extra quetiapine 200 mg HS bc no longer needed it. Anxiety is chronic but at baseline as is depression and not manic now. H helps her take the meds.   Memory is still bad but has been worse when on Xanax instead fo Ativan.  Sleep is fine from 10 to 9 which is much better than in the past.  Occ spells of staying up for 2 days and then needs to take quetiapine.   [name] goes for FU soon for cancer check up. Plan: no med changes  05/21/20 appt with following noted: "A lot of problems".  Can't drive bc fear and anxiety.  Needed to drive H and couldn't. Fall off toilet twice. Dizzy and HA 2 days ago with a lot of crying and stress with daughter. Leaving home even coming here causes anxiety.    Cataract surgery twice. Fear of [name] being diagnosed with recurrent cancer. Chronic anxiety greather than depression.   Don't know what to do with daughter. Sleep, appetite and energy are OK. Tolerating meds. Sees [name] one day weekly. H helps with meds bc she's forgetful and easily confused.  Loses train of thoughts. Dog has separation anxiety and so they don't go out much.  Plan: no med changes  08/18/20 appt with following noted: 2 ER visitis with vertigo.  RX diazepam and meclizine. Rarely takes former but takes latter regularly. Shakes so bad  it made her cry and scream.  Started PT. Continues lorazepam low dose 0.5 mg AM and 1.0 mg HS.  Still has dizziness and vertigo. Memory is still bad and H administers meds with pill box. Chronic anxiety.  Depression manageable. Sleep OK and tolerates meds otherwise. [name] driving her crazy. Plan: no med changes  11/17/20 appt with following noted: Has had periods of vertigo and meds for it.  Then had manic sx with cleaning and couldn't sleep for 24 hours. Stopped quetiapine 200 and continued quetiapine XR 800. Also started diazepam 5 mg BID for vertigo and continued lorazepam for anxiety.  No meclizine now.  Vertigo stopped and manic sx stopped.  Tolerating meds now. Still feels [name] is mean to her too often and she will cry over it. Mandy's ExH [name] in jail and is  the father of Mandy's D [name] 93 yo.  Will be there for 3 mos. Not markedly depressed.  But hates herself some days.  Still memory problems about the same.  Sleeping OK now.  Periods of mood swings. Easily anxious with relatives visiting.   Plan: Mixed manic sx resolved for now. No med changes today.  02/17/2021 appointment with the following noted: Not good.  "A whole lof of everything."  Shaking for mos. It comes and goes.  Shaking at times makes her think she'll have a nervous breakdown.   Went to ER and dx vertigo.  Has tried meclizine and Valium but scared to take it.  Big shakes again today.  Memory is not good. Sleep to escape anxiety.  Scared to leave home. Not sure what brings on the spells of shaking. Today sx hit her as soon as she awoke.  Plan: No med changes  03/12/2021 phone call: Patient with a lot of anxiety.  Husband reported the patient had been hallucinating and was paranoid that he had left her.  Some confusion over identity of people that were around her.  It was suggested that she have medical work-up because it sounded like she had delirium and may have a UTI causing that. 03/17/2021 psychiatric  hospitalization. 03/22/2021 MD response:  Note Patient has been very unstable with bipolar disorder mixed symptoms lately also with some delirium of unknown cause.  She will be seeing her primary care physician tomorrow.  Due to the severity of her symptoms it is suggested that she add haloperidol 2 mg nightly to her current quetiapine 800 mg nightly since we cannot increase the quetiapine dose further.  This may help with her confusion and agitation.  It is okay to talk with her husband because he administers her medication and because she is too confused to understand the instructions.     04/16/21 H called with following:  Pt.'s husband called.  She is currently in [facility] waiting for an inpatient pyschiatric bed.  Pt advised her husband that Dr Clovis Pu told her to stop taking the Lorazepam.  He said he is not aware of that chang in her prescription.  He would like someone to call him and advise if there were any changes to her meds so he stays aware and can let the hospital know if needed. Husband was informed we had not made any medication changes and would not have suggested she abruptly stopped lorazepam which could cause withdrawal and other kinds of problems.  05/11/2021 appointment with the following noted:  spoke with H for info At hospital was taken off Ativan, buspirone, and Seroquel reduced to 500 mg HS (as 1 of XR 400 mg  and 1/2 of 200 mg IR quetiapine).  Had sedative SE at 200 mg queiapinte !R.  Off risperidone and haloperidol.   Hospitalized at [facility] for 9 days.  They didn't feel communication was good with them. But she is better now than she was.  Had been psychotic PTH.  Last 2-3 days getting more hyper and can't wind down and hard time going to sleep.  Came home 05/03/21. She didn't like the restrictions at the hospital.  No panic lately and wants something to help her sleep.  Prior psychiatric medication trials include paroxetine, Lexapro,  risperidone 4 mg  which was sedating, aripiprazole,  perphenazine,  Seroquel 1000, olanzapine for anxiety, and  lithium which was not tolerated even at very low dosages.  Topamax for anxiety, She was  intolerant of both Namenda and Aricept.   Lamotrigine 300.   Xanax, Ativan, diazepam 5 mg BID   This is not an exhaustive list.   At the visit in February 2020 Yanely was more acutely confused recently for no clear reason.   She had a negative medical work-up for causes of short-term memory problems and confusion.  Therefore the decision was made to try switching her from alprazolam to lorazepam at the lowest possible dose in hopes of improving her cognition.  This was successful and her cognition was improved significantly and her husband verified this.     Review of Systems:  Review of Systems  Eyes:  Positive for visual disturbance.  Cardiovascular:  Negative for chest pain.  Musculoskeletal:  Positive for arthralgias, back pain and gait problem.  Neurological:  Positive for dizziness. Negative for tremors and weakness.  Psychiatric/Behavioral:  Positive for confusion. Negative for agitation, behavioral problems, decreased concentration, dysphoric mood, hallucinations, self-injury, sleep disturbance and suicidal ideas. The patient is nervous/anxious. The patient is not hyperactive.    Medications: I have reviewed the patient's current medications.  Current Outpatient Medications  Medication Sig Dispense Refill   acetaminophen (TYLENOL) 325 MG tablet Take 650 mg by mouth every 6 (six) hours as needed for mild pain or headache.     amLODipine (NORVASC) 5 MG tablet Take 5 mg by mouth every evening.     aspirin 81 MG tablet Take 81 mg by mouth daily.     cephALEXin (KEFLEX) 250 MG capsule Take 250 mg by mouth at bedtime.     famotidine (PEPCID) 20 MG tablet Take 20 mg by mouth in the morning and at bedtime.     furosemide (LASIX) 20 MG tablet Take 20 mg by mouth daily as needed (for severe swelling).      glimepiride (AMARYL) 1 MG tablet Take 1 mg by mouth daily.     lamoTRIgine (LAMICTAL) 150 MG tablet Take 1 tablet (150 mg total) by mouth 2 (two) times daily. (Patient taking differently: Take 150 mg by mouth in the morning and at bedtime.) 180 tablet 3   levocetirizine (XYZAL) 5 MG tablet Take 5 mg by mouth every evening.     levothyroxine (SYNTHROID) 100 MCG tablet Take 100 mcg by mouth daily before breakfast.     lidocaine (LIDODERM) 5 % Place 1 patch onto the skin daily. Remove & Discard patch within 12 hours or as directed by MD 6 patch 0   lisinopril (PRINIVIL,ZESTRIL) 20 MG tablet Take 20 mg by mouth daily.     meclizine (ANTIVERT) 25 MG tablet Take 1 tablet (25 mg total) by mouth 3 (three) times daily as needed for dizziness. 30 tablet 0   meloxicam (MOBIC) 15 MG tablet Take 15 mg by mouth daily.     metoprolol succinate (TOPROL-XL) 50 MG 24 hr tablet Take 50 mg by mouth every evening. Take with or immediately following a meal.     phenazopyridine (PYRIDIUM) 200 MG tablet Take 200 mg by mouth 3 (three) times daily as needed for pain.      polyethylene glycol (MIRALAX / GLYCOLAX) packet Take 17 g by mouth 2 (two) times daily as needed for mild constipation (MIX AS DIRECTED AND DRINK).     potassium chloride SA (K-DUR,KLOR-CON) 20 MEQ tablet Take 20 mEq by mouth daily.     QUEtiapine (SEROQUEL XR) 400 MG 24 hr tablet Take 2 tablets (800 mg total) by mouth at bedtime. (Patient taking differently: Take 800 mg by  mouth at bedtime. 1 500 mg tab HS) 180 tablet 3   QUEtiapine (SEROQUEL) 300 MG tablet Take 0.5 tablets (150 mg total) by mouth at bedtime. 45 tablet 0   diclofenac sodium (VOLTAREN) 1 % GEL Apply 2 g topically 4 (four) times daily. Rub into affected area of foot 2 to 4 times daily (Patient not taking: No sig reported) 100 g 2   esomeprazole (NEXIUM) 40 MG capsule Take 1 capsule (40 mg total) by mouth daily. (Patient not taking: No sig reported) 30 capsule 0   LORazepam (ATIVAN) 0.5 MG  tablet TAKE 1 TABLET IN THE MORNING AND TAKE 2 and 1/2 TABLETS AT NIGHT (Patient not taking: Reported on 05/11/2021) 105 tablet 1   simvastatin (ZOCOR) 40 MG tablet Take 1 tablet (40 mg total) by mouth every evening. 30 tablet 0   Vitamin D, Ergocalciferol, (DRISDOL) 1.25 MG (50000 UNIT) CAPS capsule TAKE 1 CAPSULE (50,000  UNITS TOTAL) BY MOUTH TWICE WEEKLY (Patient not taking: Reported on 05/11/2021) 26 capsule 3   No current facility-administered medications for this visit.    Medication Side Effects: None  Allergies:  Allergies  Allergen Reactions   Maxzide [Triamterene-Hctz] Other (See Comments)    Hurt patient's kidneys   Celexa [Citalopram Hydrobromide] Nausea Only   Metformin And Related Nausea And Vomiting   Other Nausea And Vomiting and Other (See Comments)    Unnamed medication hurt the patient's kidneys   Reglan [Metoclopramide] Nausea Only   Risperdal [Risperidone] Nausea Only and Other (See Comments)    Also caused hallucinations   Trazodone And Nefazodone Nausea Only   Zestril [Lisinopril] Nausea Only   Lithium Palpitations and Other (See Comments)    Shakes and delusional pt started seeing things      Past Medical History:  Diagnosis Date   Anxiety    Arthritis    Asthma    Bipolar 1 disorder (HCC)    CHF (congestive heart failure) (HCC)    Complication of anesthesia    left a bad taste in my mouth it has lasted since january    Depression    Diabetes mellitus    Hernia    umbilical   Hyperlipidemia    Hypertension    Hypothyroidism    Pericarditis, viral 2010 October   Sleep apnea    uses cpap setting of 15    Family History  Problem Relation Age of Onset   Diabetes Mother    Hypertension Mother    Hyperlipidemia Mother    Diabetes Father    Hypertension Father    Hyperlipidemia Father    Hypertension Sister     Social History   Socioeconomic History   Marital status: Married    Spouse name: Not on file   Number of children: Not on file    Years of education: Not on file   Highest education level: Not on file  Occupational History   Not on file  Tobacco Use   Smoking status: Never   Smokeless tobacco: Never  Substance and Sexual Activity   Alcohol use: No   Drug use: No   Sexual activity: Not on file  Other Topics Concern   Not on file  Social History Narrative   Not on file   Social Determinants of Health   Financial Resource Strain: Not on file  Food Insecurity: Not on file  Transportation Needs: Not on file  Physical Activity: Not on file  Stress: Not on file  Social Connections: Not on file  Intimate Partner Violence: Not on file    Past Medical History, Surgical history, Social history, and Family history were reviewed and updated as appropriate.   Please see review of systems for further details on the patient's review from today.   Objective:   Physical Exam:  There were no vitals taken for this visit.  Physical Exam Constitutional:      General: She is not in acute distress. Musculoskeletal:        General: No deformity.  Neurological:     Mental Status: She is alert and oriented to person, place, and time.     Cranial Nerves: No dysarthria.     Coordination: Coordination normal.  Psychiatric:        Attention and Perception: Attention and perception normal. She does not perceive auditory or visual hallucinations.        Mood and Affect: Mood is anxious. Mood is not depressed. Affect is not labile, blunt, angry, tearful or inappropriate.        Speech: Speech normal. Speech is not slurred.        Behavior: Behavior normal. Behavior is cooperative.        Thought Content: Thought content normal. Thought content is not paranoid or delusional. Thought content does not include homicidal or suicidal ideation. Thought content does not include homicidal or suicidal plan.        Cognition and Memory: Cognition is impaired. Memory is impaired.        Judgment: Judgment normal.     Comments:  Insight fair and chronically stressed. Fair judgment. Anxiety is worse  Talkative chronically per usual.  Lab Review:     Component Value Date/Time   NA 143 04/15/2021 1831   K 4.2 04/15/2021 1831   CL 106 04/15/2021 1831   CO2 27 04/15/2021 1831   GLUCOSE 162 (H) 04/15/2021 1831   GLUCOSE 154 (H) 09/01/2006 1100   BUN 15 04/15/2021 1831   CREATININE 0.91 04/15/2021 1831   CREATININE 0.93 07/19/2011 1110   CALCIUM 11.1 (H) 04/15/2021 1831   PROT 7.5 04/15/2021 1831   ALBUMIN 4.2 04/15/2021 1831   AST 26 04/15/2021 1831   ALT 21 04/15/2021 1831   ALKPHOS 104 04/15/2021 1831   BILITOT 0.7 04/15/2021 1831   GFRNONAA >60 04/15/2021 1831   GFRAA >60 03/23/2016 1933       Component Value Date/Time   WBC 5.7 04/15/2021 1831   RBC 4.37 04/15/2021 1831   HGB 14.1 04/15/2021 1831   HCT 41.0 04/15/2021 1831   PLT 221 04/15/2021 1831   MCV 93.8 04/15/2021 1831   MCH 32.3 04/15/2021 1831   MCHC 34.4 04/15/2021 1831   RDW 13.7 04/15/2021 1831   LYMPHSABS 1.2 04/15/2021 1831   MONOABS 0.4 04/15/2021 1831   EOSABS 0.1 04/15/2021 1831   BASOSABS 0.1 04/15/2021 1831   Prior lamotrigine level in 2018 on this dosage was 3.6.  Not particularly high.  Per Dr. Marcos Eke: Clinical Impressions: Cognitive complaints are likely due to underlying psychiatric disorder (bipolar disorder/anxiety disorder with racing thoughts). Diagnostic impressions based on test performances are limited due to the patient's severe inability to fully attend to the tasks. However, based on clinical presentation, I highly doubt the presence of a neurodegenerative dementia. It is much more likely that her racing thoughts and psychiatric disorders are interfering with cognitive   No results found for: POCLITH, LITHIUM   No results found for: PHENYTOIN, PHENOBARB, VALPROATE, CBMZ   .res Assessment: Plan:    Bipolar  affective disorder, mixed, severe, with psychotic behavior ([place]) - Plan: QUEtiapine (SEROQUEL) 300 MG  tablet  Panic disorder with agoraphobia  Generalized anxiety disorder  Mild cognitive impairment  Claustrophobia  Insomnia due to mental condition  Low vitamin D level  Benign paroxysmal positional vertigo due to bilateral vestibular disorder  Acute confusional state   anice has chronic severe anxiety with panic attacks as well as bipolar disorder with a history of significant lability.  She has had more severe instability with psychotic sx and cognitive problems off and on lately.  She remains chronically anxious and that has been worse lately.   Psych hospitalization 04/2021 at [facility] for psychosis. She also has mild cognitive impairment as a complicating factor.  She needs her husband's assistance to manage her medications.  She has  fallen several times and couldn't get up.   Mixed manic sx including psychosis recently but not psychotic now but insomnia.. Will get more psychotic if she doesn't sleep but apparently didn't tolerate Seroquel IR 200 mg HS well so will increase to quetiapine IR 150 mg HS and XR 400 mg nightly. Continue lamotrigine.    Consider switch to [place] bc lower SE and may help anxiety but probably would not be covered by insurance.  Discussed potential metabolic side effects associated with atypical antipsychotics, as well as potential risk for movement side effects. Advised pt to contact office if movement side effects occur.   Has been able to stop BZ and been without them since 04/26/21 so should be past the sig period of withdrawal.  Pt is chronically needy and requires extended appts DT chronic anxiety and easily stressed.  Supportive and cognitive work are done with the patient on her excessive guilt and now stress of vertigo.  Cannot drive now and wasn't much before.  Also stressed  By D and GD and how to deal with GD's school refusal.  For example taking on responsibility for things with her granddaughter that she can fact not control.  Also  addressing her fears related to her husband's diagnosis of cancer. Supportive therapy dealing with chronic stressors and difficulty with daughter.   Pt doesn't feel able to fix her meds by herself. Disc her fears about going out and other phobias and disc behaviour therapy for I.t  Guarded prognosis.  this appt was 35 mins.  FU1 mos  [name], MD, DFAPA   Please see After Visit Summary for patient specific instructions.  Future Appointments  Date Time Provider College  07/08/2021  8:30 AM Hazle Coca, PhD [facility] None  07/08/2021  9:30 AM [facility] None  07/28/2021  2:30 PM Hazle Coca, PhD [facility] None    No orders of the defined types were placed in this encounter.     -------------------------------

## 2021-06-11 ENCOUNTER — Telehealth (INDEPENDENT_AMBULATORY_CARE_PROVIDER_SITE_OTHER): Payer: Medicare HMO | Admitting: Psychiatry

## 2021-06-11 ENCOUNTER — Encounter: Payer: Self-pay | Admitting: Psychiatry

## 2021-06-11 DIAGNOSIS — F5105 Insomnia due to other mental disorder: Secondary | ICD-10-CM

## 2021-06-11 DIAGNOSIS — F411 Generalized anxiety disorder: Secondary | ICD-10-CM | POA: Diagnosis not present

## 2021-06-11 DIAGNOSIS — F40240 Claustrophobia: Secondary | ICD-10-CM

## 2021-06-11 DIAGNOSIS — F3164 Bipolar disorder, current episode mixed, severe, with psychotic features: Secondary | ICD-10-CM

## 2021-06-11 DIAGNOSIS — G3184 Mild cognitive impairment, so stated: Secondary | ICD-10-CM | POA: Diagnosis not present

## 2021-06-11 DIAGNOSIS — F4001 Agoraphobia with panic disorder: Secondary | ICD-10-CM

## 2021-06-11 DIAGNOSIS — R7989 Other specified abnormal findings of blood chemistry: Secondary | ICD-10-CM

## 2021-06-11 MED ORDER — QUETIAPINE FUMARATE ER 400 MG PO TB24: 400.0000 mg | ORAL_TABLET | Freq: Every day | ORAL | 0 refills | Status: DC

## 2021-06-11 NOTE — Progress Notes (Signed)
[name] [id number] 12-May-1951 70 y.o.   Video Visit via My Chart  I connected with pt by My Chart and verified that I am speaking with the correct person using two identifiers.   I discussed the limitations, risks, security and privacy concerns of performing an evaluation and management service by My Chart  and the availability of in person appointments. I also discussed with the patient that there may be a patient responsible charge related to this service. The patient expressed understanding and agreed to proceed.  I discussed the assessment and treatment plan with the patient. The patient was provided an opportunity to ask questions and all were answered. The patient agreed with the plan and demonstrated an understanding of the instructions.   The patient was advised to call back or seek an in-person evaluation if the symptoms worsen or if the condition fails to improve as anticipated.  I provided 30 minutes of video time during this encounter.  The patient was located at home and the provider was located office. Session from 1030 until 11 AM  Subjective:   Patient ID:  [name] is a 70 y.o. (DOB 03/15/1951) female.  Chief Complaint:  Chief Complaint  Patient presents with   Follow-up   Bipolar affective disorder, mixed, severe, with psychotic b   Sleeping Problem   Anxiety    Anxiety Symptoms include confusion, dizziness and nervous/anxious behavior. Patient reports no chest pain, decreased concentration or suicidal ideas.       [name] presents to the office today for follow-up of anxiety and depression and poor cognition.  At her visit October 12, 2018.  Because of her poor cognition which did seem to get even worse lately with her memory and given a negative unremarkable medical work-up by her primary care doctor the decision was made to change her from Xanax which she has been on for years to lorazepam.  Lorazepam often has less cognitive side effects  than does alprazolam.  She had a lot of difficulty with headaches and insomnia with the transition.  She called several times in that transition.  Her husband reported that her cognition was better after the transition however.  She was satisfied with the Ativan.  There was an attempt to reduce her lamotrigine to 150 mg also in hopes of improving cognition but it was uncertain at the last visit where there she had actually done so.  visit December 2020 without med changes.  She had continued to do cognitively better off alprazolam and on lorazepam.  seen March 2021.   Better than December visit.  Feeling good and less down.  Adjusting time of med helped excessively sleep.  Brief hypomanic episode resolved where she cleaned all night.  Sleep 10 hours . No med changes.  12/23/19 appt.  Noted: More depression, crying and racing thoughts and distressed to the point of wondering if needed the hospital.  Sleep got worse and needed Seroquel IR 200 also bc went 2 days without sleep.  It worked and helped sleep.  Worry a lot and melancholy this week.  Cries over what happens dealing with her daugher.[name] has prostate and kidney cancer again.  Had surgery June 10, 2019 and she's cried a lot.  He's incontinent and very uncomfortable.  He usually has positive attitude.   Worry over his health and how she'll do if something happens to him.  She's afraid of Covid.  Trusting in God usually.  She realizes she  Needs  to drive occasionally bc of his health.  [name] had prostate CA and lived to 59 yo.  Uncle died of it.  Not markedly depressed. Can't walk much dT plantar fascitis. Also stressed over [name] 68 yo not doing school work and causing problems.  Her father [name] is a bad parent and not helpful.  Stressed over [name] and downs too and they have periodic conflict.   More down and anxious.   Guilt tendency.   Patient reports difficulty with sleep initiation or maintenance in part DT anxiety and  racing thoughts.Intermittent sleep problems with days and nights mixed up.  Not napping. Not manic.[name] Denies appetite disturbance.  Patient reports that energy and motivation have been good.  Patient has some difficulty with concentration.  Chronic memory problems.   Patient denies any suicidal ideation. Plan:   No changes in meds indicated except agree to take the extra Seroquel 200 IR HS for mixed manic sx that are worse right now.  May be having mixed seasonal sx.  02/20/2020 appointment with the following noted: Doing fairly good except chronic worry over [name].  Some days cries a lot over it.  D having a hard time lately.  Still easily stressed but not more than usual. Stopped extra quetiapine 200 mg HS bc no longer needed it. Anxiety is chronic but at baseline as is depression and not manic now. H helps her take the meds.   Memory is still bad but has been worse when on Xanax instead fo Ativan.  Sleep is fine from 10 to 9 which is much better than in the past.  Occ spells of staying up for 2 days and then needs to take quetiapine.   [name] goes for FU soon for cancer check up. Plan: no med changes  05/21/20 appt with following noted: "A lot of problems".  Can't drive bc fear and anxiety.  Needed to drive H and couldn't. Fall off toilet twice. Dizzy and HA 2 days ago with a lot of crying and stress with daughter. Leaving home even coming here causes anxiety.    Cataract surgery twice. Fear of Bill being diagnosed with recurrent cancer. Chronic anxiety greather than depression.   Don't know what to do with daughter. Sleep, appetite and energy are OK. Tolerating meds. Sees [name] one day weekly. H helps with meds bc she's forgetful and easily confused.  Loses train of thoughts. Dog has separation anxiety and so they don't go out much.  Plan: no med changes  08/18/20 appt with following noted: 2 ER visitis with vertigo.  RX diazepam and meclizine. Rarely takes former but takes  latter regularly. Shakes so bad it made her cry and scream.  Started PT. Continues lorazepam low dose 0.5 mg AM and 1.0 mg HS.  Still has dizziness and vertigo. Memory is still bad and H administers meds with pill box. Chronic anxiety.  Depression manageable. Sleep OK and tolerates meds otherwise. [name] driving her crazy. Plan: no med changes  11/17/20 appt with following noted: Has had periods of vertigo and meds for it.  Then had manic sx with cleaning and couldn't sleep for 24 hours. Stopped quetiapine 200 and continued quetiapine XR 800. Also started diazepam 5 mg BID for vertigo and continued lorazepam for anxiety.  No meclizine now.  Vertigo stopped and manic sx stopped.  Tolerating meds now. Still feels [name] is mean to her too often and she will cry over it. Mandy's ExH [name] in  jail and is the father of Mandy's D [name] 41 yo.  Will be there for 3 mos. Not markedly depressed.  But hates herself some days.  Still memory problems about the same.  Sleeping OK now.  Periods of mood swings. Easily anxious with relatives visiting.   Plan: Mixed manic sx resolved for now. No med changes today.  02/17/2021 appointment with the following noted: Not good.  "A whole lof of everything."  Shaking for mos. It comes and goes.  Shaking at times makes her think she'll have a nervous breakdown.   Went to ER and dx vertigo.  Has tried meclizine and Valium but scared to take it.  Big shakes again today.  Memory is not good. Sleep to escape anxiety.  Scared to leave home. Not sure what brings on the spells of shaking. Today sx hit her as soon as she awoke.  Plan: No med changes  03/12/2021 phone call: Patient with a lot of anxiety.  Husband reported the patient had been hallucinating and was paranoid that he had left her.  Some confusion over identity of people that were around her.  It was suggested that she have medical work-up because it sounded like she had delirium and may have a UTI causing  that. 03/17/2021 psychiatric hospitalization. 03/22/2021 MD response:  Note Patient has been very unstable with bipolar disorder mixed symptoms lately also with some delirium of unknown cause.  She will be seeing her primary care physician tomorrow.  Due to the severity of her symptoms it is suggested that she add haloperidol 2 mg nightly to her current quetiapine 800 mg nightly since we cannot increase the quetiapine dose further.  This may help with her confusion and agitation.  It is okay to talk with her husband because he administers her medication and because she is too confused to understand the instructions.     04/16/21 H called with following:  Pt.'s husband called.  She is currently in [facility] waiting for an inpatient pyschiatric bed.  Pt advised her husband that Dr Clovis Pu told her to stop taking the Lorazepam.  He said he is not aware of that chang in her prescription.  He would like someone to call him and advise if there were any changes to her meds so he stays aware and can let the hospital know if needed. Husband was informed we had not made any medication changes and would not have suggested she abruptly stopped lorazepam which could cause withdrawal and other kinds of problems.  05/11/2021 appointment with the following noted:  spoke with H for info At hospital was taken off Ativan, buspirone, and Seroquel reduced to 500 mg HS (as 1 of XR 400 mg  and 1/2 of 200 mg IR quetiapine).  Had sedative SE at 200 mg queiapinte !R.  Off risperidone and haloperidol.   Hospitalized at [facility] for 9 days.  They didn't feel communication was good with them. But she is better now than she was.  Had been psychotic PTH.  Last 2-3 days getting more hyper and can't wind down and hard time going to sleep.  Came home 05/03/21. She didn't like the restrictions at the hospital.  No panic lately and wants something to help her sleep. Plan; Mixed manic sx including psychosis recently but not  psychotic now but insomnia.. Will get more psychotic if she doesn't sleep but apparently didn't tolerate Seroquel IR 200 mg HS well so will increase to quetiapine IR 150 mg  HS and XR 400 mg nightly. Continue lamotrigine.    06/11/21 appt:  [place] [facility] thinks she's doing goood. Doing very fine.  No trouble sleeping.  Sometimes in morning feels sad bc she knows she will have racing thoughts, but it resolves after a little while.  Racing thoughts since hospital.  Worries over money chronically. To bed 1015-7:30.  Maybe longer which is normal. H administers meds.  Some wordfinding problems.  Does journal which helps. No SE with meds.  Yesterday was hard with anxiety but today is better.  On lamotrigine 150 BID, SERoquel XR 400 + Seroquel IR 150 mg HS.  Prior psychiatric medication trials include paroxetine, Lexapro,  risperidone 4 mg which was sedating, aripiprazole,  perphenazine,  Seroquel 1000, olanzapine for anxiety, and  lithium which was not tolerated even at very low dosages.  Topamax for anxiety, She was intolerant of both Namenda and Aricept.   Lamotrigine 300.   Xanax, Ativan, diazepam 5 mg BID   This is not an exhaustive list.   At the visit in February 2020 Erik was more acutely confused recently for no clear reason.   She had a negative medical work-up for causes of short-term memory problems and confusion.  Therefore the decision was made to try switching her from alprazolam to lorazepam at the lowest possible dose in hopes of improving her cognition.  This was successful and her cognition was improved significantly and her husband verified this.     Review of Systems:  Review of Systems  Eyes:  Positive for visual disturbance.  Cardiovascular:  Negative for chest pain.  Musculoskeletal:  Positive for arthralgias, back pain and gait problem.  Neurological:  Positive for dizziness. Negative for tremors and weakness.  Psychiatric/Behavioral:  Positive for confusion. Negative for  agitation, behavioral problems, decreased concentration, dysphoric mood, hallucinations, self-injury, sleep disturbance and suicidal ideas. The patient is nervous/anxious. The patient is not hyperactive.    Medications: I have reviewed the patient's current medications.  Current Outpatient Medications  Medication Sig Dispense Refill   acetaminophen (TYLENOL) 325 MG tablet Take 650 mg by mouth every 6 (six) hours as needed for mild pain or headache.     amLODipine (NORVASC) 5 MG tablet Take 5 mg by mouth every evening.     aspirin 81 MG tablet Take 81 mg by mouth daily.     cephALEXin (KEFLEX) 250 MG capsule Take 250 mg by mouth at bedtime.     esomeprazole (NEXIUM) 40 MG capsule Take 1 capsule (40 mg total) by mouth daily. 30 capsule 0   glimepiride (AMARYL) 1 MG tablet Take 1 mg by mouth daily.     lamoTRIgine (LAMICTAL) 150 MG tablet Take 1 tablet (150 mg total) by mouth 2 (two) times daily. (Patient taking differently: Take 150 mg by mouth in the morning and at bedtime.) 180 tablet 3   levocetirizine (XYZAL) 5 MG tablet Take 5 mg by mouth every evening.     levothyroxine (SYNTHROID) 100 MCG tablet Take 100 mcg by mouth daily before breakfast.     lisinopril (PRINIVIL,ZESTRIL) 20 MG tablet Take 20 mg by mouth daily.     meclizine (ANTIVERT) 25 MG tablet Take 1 tablet (25 mg total) by mouth 3 (three) times daily as needed for dizziness. 30 tablet 0   meloxicam (MOBIC) 15 MG tablet Take 15 mg by mouth daily.     metoprolol succinate (TOPROL-XL) 50 MG 24 hr tablet Take 50 mg by mouth every evening. Take with or immediately  following a meal.     Multiple Vitamin (MULTIVITAMIN) tablet Take 1 tablet by mouth daily.     phenazopyridine (PYRIDIUM) 200 MG tablet Take 200 mg by mouth 3 (three) times daily as needed for pain.      polyethylene glycol (MIRALAX / GLYCOLAX) packet Take 17 g by mouth 2 (two) times daily as needed for mild constipation (MIX AS DIRECTED AND DRINK).     potassium chloride SA  (K-DUR,KLOR-CON) 20 MEQ tablet Take 20 mEq by mouth daily.     QUEtiapine (SEROQUEL) 300 MG tablet Take 0.5 tablets (150 mg total) by mouth at bedtime. 45 tablet 0   simvastatin (ZOCOR) 40 MG tablet Take 1 tablet (40 mg total) by mouth every evening. 30 tablet 0   diclofenac sodium (VOLTAREN) 1 % GEL Apply 2 g topically 4 (four) times daily. Rub into affected area of foot 2 to 4 times daily (Patient not taking: No sig reported) 100 g 2   famotidine (PEPCID) 20 MG tablet Take 20 mg by mouth in the morning and at bedtime. (Patient not taking: Reported on 06/11/2021)     furosemide (LASIX) 20 MG tablet Take 20 mg by mouth daily as needed (for severe swelling). (Patient not taking: Reported on 06/11/2021)     lidocaine (LIDODERM) 5 % Place 1 patch onto the skin daily. Remove & Discard patch within 12 hours or as directed by MD (Patient not taking: Reported on 06/11/2021) 6 patch 0   QUEtiapine (SEROQUEL XR) 400 MG 24 hr tablet Take 1 tablet (400 mg total) by mouth at bedtime. 90 tablet 0   No current facility-administered medications for this visit.    Medication Side Effects: None  Allergies:  Allergies  Allergen Reactions   Maxzide [Triamterene-Hctz] Other (See Comments)    Hurt patient's kidneys   Celexa [Citalopram Hydrobromide] Nausea Only   Metformin And Related Nausea And Vomiting   Other Nausea And Vomiting and Other (See Comments)    Unnamed medication hurt the patient's kidneys   Reglan [Metoclopramide] Nausea Only   Risperdal [Risperidone] Nausea Only and Other (See Comments)    Also caused hallucinations   Trazodone And Nefazodone Nausea Only   Zestril [Lisinopril] Nausea Only   Lithium Palpitations and Other (See Comments)    Shakes and delusional pt started seeing things      Past Medical History:  Diagnosis Date   Anxiety    Arthritis    Asthma    Bipolar 1 disorder (HCC)    CHF (congestive heart failure) (HCC)    Complication of anesthesia    left a bad taste in  my mouth it has lasted since january    Depression    Diabetes mellitus    Hernia    umbilical   Hyperlipidemia    Hypertension    Hypothyroidism    Pericarditis, viral 2010 October   Sleep apnea    uses cpap setting of 15    Family History  Problem Relation Age of Onset   Diabetes Mother    Hypertension Mother    Hyperlipidemia Mother    Diabetes Father    Hypertension Father    Hyperlipidemia Father    Hypertension Sister     Social History   Socioeconomic History   Marital status: Married    Spouse name: Not on file   Number of children: Not on file   Years of education: Not on file   Highest education level: Not on file  Occupational History  Not on file  Tobacco Use   Smoking status: Never   Smokeless tobacco: Never  Substance and Sexual Activity   Alcohol use: No   Drug use: No   Sexual activity: Not on file  Other Topics Concern   Not on file  Social History Narrative   Not on file   Social Determinants of Health   Financial Resource Strain: Not on file  Food Insecurity: Not on file  Transportation Needs: Not on file  Physical Activity: Not on file  Stress: Not on file  Social Connections: Not on file  Intimate Partner Violence: Not on file    Past Medical History, Surgical history, Social history, and Family history were reviewed and updated as appropriate.   Please see review of systems for further details on the patient's review from today.   Objective:   Physical Exam:  There were no vitals taken for this visit.  Physical Exam Constitutional:      General: She is not in acute distress. Musculoskeletal:        General: No deformity.  Neurological:     Mental Status: She is alert and oriented to person, place, and time.     Cranial Nerves: No dysarthria.     Coordination: Coordination normal.  Psychiatric:        Attention and Perception: Attention and perception normal. She does not perceive auditory or visual hallucinations.         Mood and Affect: Mood is anxious. Mood is not depressed. Affect is not labile, blunt, angry, tearful or inappropriate.        Speech: Speech normal. Speech is not slurred.        Behavior: Behavior normal. Behavior is cooperative.        Thought Content: Thought content normal. Thought content is not paranoid or delusional. Thought content does not include homicidal or suicidal ideation. Thought content does not include homicidal or suicidal plan.        Cognition and Memory: Cognition is impaired. Memory is impaired.        Judgment: Judgment normal.     Comments: Insight fair and chronically stressed. Fair judgment. Anxiety is worse  Talkative chronically per usual.  Lab Review:     Component Value Date/Time   NA 143 04/15/2021 1831   K 4.2 04/15/2021 1831   CL 106 04/15/2021 1831   CO2 27 04/15/2021 1831   GLUCOSE 162 (H) 04/15/2021 1831   GLUCOSE 154 (H) 09/01/2006 1100   BUN 15 04/15/2021 1831   CREATININE 0.91 04/15/2021 1831   CREATININE 0.93 07/19/2011 1110   CALCIUM 11.1 (H) 04/15/2021 1831   PROT 7.5 04/15/2021 1831   ALBUMIN 4.2 04/15/2021 1831   AST 26 04/15/2021 1831   ALT 21 04/15/2021 1831   ALKPHOS 104 04/15/2021 1831   BILITOT 0.7 04/15/2021 1831   GFRNONAA >60 04/15/2021 1831   GFRAA >60 03/23/2016 1933       Component Value Date/Time   WBC 5.7 04/15/2021 1831   RBC 4.37 04/15/2021 1831   HGB 14.1 04/15/2021 1831   HCT 41.0 04/15/2021 1831   PLT 221 04/15/2021 1831   MCV 93.8 04/15/2021 1831   MCH 32.3 04/15/2021 1831   MCHC 34.4 04/15/2021 1831   RDW 13.7 04/15/2021 1831   LYMPHSABS 1.2 04/15/2021 1831   MONOABS 0.4 04/15/2021 1831   EOSABS 0.1 04/15/2021 1831   BASOSABS 0.1 04/15/2021 1831   Prior lamotrigine level in 2018 on this dosage was 3.6.  Not  particularly high.  Per Dr. Marcos Eke: Clinical Impressions: Cognitive complaints are likely due to underlying psychiatric disorder (bipolar disorder/anxiety disorder with racing  thoughts). Diagnostic impressions based on test performances are limited due to the patient's severe inability to fully attend to the tasks. However, based on clinical presentation, I highly doubt the presence of a neurodegenerative dementia. It is much more likely that her racing thoughts and psychiatric disorders are interfering with cognitive   No results found for: POCLITH, LITHIUM   No results found for: PHENYTOIN, PHENOBARB, VALPROATE, CBMZ   .res Assessment: Plan:    Bipolar affective disorder, mixed, severe, with psychotic behavior ([place])  Panic disorder with agoraphobia  Generalized anxiety disorder  Mild cognitive impairment  Claustrophobia  Insomnia due to mental condition  Low vitamin D level   Thayer Headings has chronic severe anxiety with panic attacks as well as bipolar disorder with a history of significant lability.  She has had more severe instability with psychotic sx and cognitive problems off and on lately.  She remains chronically anxious and that has been worse lately.   Psych hospitalization 04/2021 at [facility] for psychosis. She also has mild cognitive impairment as a complicating factor.  She needs her husband's assistance to manage her medications.  She has  fallen several times and couldn't get up.  Much better but not fully resolved.   Mixed manic sx including psychosis recently but not psychotic but insomnia last visit resolved with the following... apparently didn't tolerate Seroquel IR 200 mg HS well so increased quetiapine IR 150 mg HS and XR 400 mg nightly and now she is sleeping but still having some racing thoughts. Continue lamotrigine 150 BID.    Consider switch to Epes bc lower SE and may help anxiety but probably would not be covered by insurance.  Discussed potential metabolic side effects associated with atypical antipsychotics, as well as potential risk for movement side effects. Advised pt to contact office if movement side effects  occur.   Has been able to stop BZ and been without them since 04/26/21 so should be past the sig period of withdrawal.  Pt is chronically needy and requires extended appts DT chronic anxiety and easily stressed.  Supportive and cognitive work are done with the patient on her excessive guilt and now stress of vertigo.  Cannot drive now and wasn't much before.  Also addressing her fears related to her husband's diagnosis of cancer. Supportive therapy dealing with chronic stressors and difficulty with daughter.   Pt doesn't feel able to fix her meds by herself. Disc her fears about going out and other phobias and disc behaviour therapy for I.t  No med changes this visit.  Patient is off benzodiazepine and just taking the 2 versions of quetiapine plus lamotrigine  this appt was 35 mins.  FU1 mos  [name], MD, DFAPA   Please see After Visit Summary for patient specific instructions.  Future Appointments  Date Time Provider [place]  07/08/2021  8:30 AM Hazle Coca, PhD [facility] None  07/08/2021  9:30 AM [facility] TECH [facility] None  07/14/2021  2:00 PM Cottle, Billey Co., MD [facility] None  07/28/2021  2:30 PM Hazle Coca, PhD [facility] None    No orders of the defined types were placed in this encounter.     -------------------------------

## 2021-06-15 ENCOUNTER — Other Ambulatory Visit: Payer: Self-pay | Admitting: Psychiatry

## 2021-07-01 ENCOUNTER — Other Ambulatory Visit: Payer: Self-pay | Admitting: Psychiatry

## 2021-07-08 ENCOUNTER — Encounter: Payer: Medicare HMO | Admitting: Psychology

## 2021-07-14 ENCOUNTER — Ambulatory Visit (INDEPENDENT_AMBULATORY_CARE_PROVIDER_SITE_OTHER): Payer: Medicare HMO | Admitting: Psychiatry

## 2021-07-14 ENCOUNTER — Encounter: Payer: Self-pay | Admitting: Psychiatry

## 2021-07-14 DIAGNOSIS — F40240 Claustrophobia: Secondary | ICD-10-CM

## 2021-07-14 DIAGNOSIS — F3164 Bipolar disorder, current episode mixed, severe, with psychotic features: Secondary | ICD-10-CM | POA: Diagnosis not present

## 2021-07-14 DIAGNOSIS — R7989 Other specified abnormal findings of blood chemistry: Secondary | ICD-10-CM | POA: Diagnosis not present

## 2021-07-14 DIAGNOSIS — F4001 Agoraphobia with panic disorder: Secondary | ICD-10-CM | POA: Diagnosis not present

## 2021-07-14 DIAGNOSIS — F5105 Insomnia due to other mental disorder: Secondary | ICD-10-CM | POA: Diagnosis not present

## 2021-07-14 DIAGNOSIS — F411 Generalized anxiety disorder: Secondary | ICD-10-CM

## 2021-07-14 DIAGNOSIS — G3184 Mild cognitive impairment, so stated: Secondary | ICD-10-CM

## 2021-07-14 MED ORDER — QUETIAPINE FUMARATE 300 MG PO TABS: 300.0000 mg | ORAL_TABLET | Freq: Every day | ORAL | 0 refills | Status: DC

## 2021-07-14 NOTE — Progress Notes (Signed)
[name] [id number] Aug 22, 1951 70 y.o.   Video Visit via My Chart  I connected with pt by My Chart and verified that I am speaking with the correct person using two identifiers.   I discussed the limitations, risks, security and privacy concerns of performing an evaluation and management service by My Chart  and the availability of in person appointments. I also discussed with the patient that there may be a patient responsible charge related to this service. The patient expressed understanding and agreed to proceed.  I discussed the assessment and treatment plan with the patient. The patient was provided an opportunity to ask questions and all were answered. The patient agreed with the plan and demonstrated an understanding of the instructions.   The patient was advised to call back or seek an in-person evaluation if the symptoms worsen or if the condition fails to improve as anticipated.  I provided 30 minutes of video time during this encounter.  The patient was located at home and the provider was located office. Session from 2:00 until 2:30 PM  Subjective:   Patient ID:  [name] is a 70 y.o. (DOB [birth year]) female.  Chief Complaint:  Chief Complaint  Patient presents with   Follow-up    Bipolar affective disorder, mixed, severe, with psychotic behavior ([place])   Anxiety   Depression   Sleeping Problem    Anxiety Symptoms include confusion, dizziness and nervous/anxious behavior. Patient reports no chest pain, decreased concentration, palpitations or suicidal ideas.       [name] presents to the office today for follow-up of anxiety and depression and poor cognition.  At her visit October 12, 2018.  Because of her poor cognition which did seem to get even worse lately with her memory and given a negative unremarkable medical work-up by her primary care doctor the decision was made to change her from Xanax which she has been on for years to lorazepam.   Lorazepam often has less cognitive side effects than does alprazolam.  She had a lot of difficulty with headaches and insomnia with the transition.  She called several times in that transition.  Her husband reported that her cognition was better after the transition however.  She was satisfied with the Ativan.  There was an attempt to reduce her lamotrigine to 150 mg also in hopes of improving cognition but it was uncertain at the last visit where there she had actually done so.  visit December 2020 without med changes.  She had continued to do cognitively better off alprazolam and on lorazepam.  seen March 2021.   Better than December visit.  Feeling good and less down.  Adjusting time of med helped excessively sleep.  Brief hypomanic episode resolved where she cleaned all night.  Sleep 10 hours . No med changes.  12/23/19 appt.  Noted: More depression, crying and racing thoughts and distressed to the point of wondering if needed the hospital.  Sleep got worse and needed Seroquel IR 200 also bc went 2 days without sleep.  It worked and helped sleep.  Worry a lot and melancholy this week.  Cries over what happens dealing with her daugher.[name] has prostate and kidney cancer again.  Had surgery June 10, 2019 and she's cried a lot.  He's incontinent and very uncomfortable.  He usually has positive attitude.   Worry over his health and how she'll do if something happens to him.  She's afraid of Covid.  Trusting in God usually.  She realizes she  Needs to drive occasionally bc of his health.  [name] had prostate CA and lived to 90 yo.  Uncle died of it.  Not markedly depressed. Can't walk much dT plantar fascitis. Also stressed over [name] 68 yo not doing school work and causing problems.  Her father [name] is a bad parent and not helpful.  Stressed over [name] and downs too and they have periodic conflict.   More down and anxious.   Guilt tendency.   Patient reports difficulty with sleep  initiation or maintenance in part DT anxiety and racing thoughts.Intermittent sleep problems with days and nights mixed up.  Not napping. Not manic.[name] Denies appetite disturbance.  Patient reports that energy and motivation have been good.  Patient has some difficulty with concentration.  Chronic memory problems.   Patient denies any suicidal ideation. Plan:   No changes in meds indicated except agree to take the extra Seroquel 200 IR HS for mixed manic sx that are worse right now.  May be having mixed seasonal sx.  02/20/2020 appointment with the following noted: Doing fairly good except chronic worry over [name].  Some days cries a lot over it.  D having a hard time lately.  Still easily stressed but not more than usual. Stopped extra quetiapine 200 mg HS bc no longer needed it. Anxiety is chronic but at baseline as is depression and not manic now. H helps her take the meds.   Memory is still bad but has been worse when on Xanax instead fo Ativan.  Sleep is fine from 10 to 9 which is much better than in the past.  Occ spells of staying up for 2 days and then needs to take quetiapine.   [name] goes for FU soon for cancer check up. Plan: no med changes  05/21/20 appt with following noted: "A lot of problems".  Can't drive bc fear and anxiety.  Needed to drive H and couldn't. Fall off toilet twice. Dizzy and HA 2 days ago with a lot of crying and stress with daughter. Leaving home even coming here causes anxiety.    Cataract surgery twice. Fear of [name] being diagnosed with recurrent cancer. Chronic anxiety greather than depression.   Don't know what to do with daughter. Sleep, appetite and energy are OK. Tolerating meds. Sees [name] one day weekly. H helps with meds bc she's forgetful and easily confused.  Loses train of thoughts. Dog has separation anxiety and so they don't go out much.  Plan: no med changes  08/18/20 appt with following noted: 2 ER visitis with vertigo.  RX diazepam  and meclizine. Rarely takes former but takes latter regularly. Shakes so bad it made her cry and scream.  Started PT. Continues lorazepam low dose 0.5 mg AM and 1.0 mg HS.  Still has dizziness and vertigo. Memory is still bad and H administers meds with pill box. Chronic anxiety.  Depression manageable. Sleep OK and tolerates meds otherwise. [name] driving her crazy. Plan: no med changes  11/17/20 appt with following noted: Has had periods of vertigo and meds for it.  Then had manic sx with cleaning and couldn't sleep for 24 hours. Stopped quetiapine 200 and continued quetiapine XR 800. Also started diazepam 5 mg BID for vertigo and continued lorazepam for anxiety.  No meclizine now.  Vertigo stopped and manic sx stopped.  Tolerating meds now. Still feels [name] is mean to her too often and she will cry over  it. Mandy's ExH [name] in jail and is the father of Mandy's D [name] 97 yo.  Will be there for 3 mos. Not markedly depressed.  But hates herself some days.  Still memory problems about the same.  Sleeping OK now.  Periods of mood swings. Easily anxious with relatives visiting.   Plan: Mixed manic sx resolved for now. No med changes today.  02/17/2021 appointment with the following noted: Not good.  "A whole lof of everything."  Shaking for mos. It comes and goes.  Shaking at times makes her think she'll have a nervous breakdown.   Went to ER and dx vertigo.  Has tried meclizine and Valium but scared to take it.  Big shakes again today.  Memory is not good. Sleep to escape anxiety.  Scared to leave home. Not sure what brings on the spells of shaking. Today sx hit her as soon as she awoke.  Plan: No med changes  03/12/2021 phone call: Patient with a lot of anxiety.  Husband reported the patient had been hallucinating and was paranoid that he had left her.  Some confusion over identity of people that were around her.  It was suggested that she have medical work-up because it sounded like she had  delirium and may have a UTI causing that. 03/17/2021 psychiatric hospitalization. 03/22/2021 MD response:  Note Patient has been very unstable with bipolar disorder mixed symptoms lately also with some delirium of unknown cause.  She will be seeing her primary care physician tomorrow.  Due to the severity of her symptoms it is suggested that she add haloperidol 2 mg nightly to her current quetiapine 800 mg nightly since we cannot increase the quetiapine dose further.  This may help with her confusion and agitation.  It is okay to talk with her husband because he administers her medication and because she is too confused to understand the instructions.     04/16/21 H called with following:  Pt.'s husband called.  She is currently in [place] [facility] waiting for an inpatient pyschiatric bed.  Pt advised her husband that Dr Clovis Pu told her to stop taking the Lorazepam.  He said he is not aware of that chang in her prescription.  He would like someone to call him and advise if there were any changes to her meds so he stays aware and can let the hospital know if needed. Husband was informed we had not made any medication changes and would not have suggested she abruptly stopped lorazepam which could cause withdrawal and other kinds of problems.  05/11/2021 appointment with the following noted:  spoke with H for info At hospital was taken off Ativan, buspirone, and Seroquel reduced to 500 mg HS (as 1 of XR 400 mg  and 1/2 of 200 mg IR quetiapine).  Had sedative SE at 200 mg queiapinte !R.  Off risperidone and haloperidol.   Hospitalized at [facility] for 9 days.  They didn't feel communication was good with them. But she is better now than she was.  Had been psychotic PTH.  Last 2-3 days getting more hyper and can't wind down and hard time going to sleep.  Came home 05/03/21. She didn't like the restrictions at the hospital.  No panic lately and wants something to help her sleep. Plan; Mixed manic sx  including psychosis recently but not psychotic now but insomnia.. Will get more psychotic if she doesn't sleep but apparently didn't tolerate Seroquel IR 200 mg HS well so will increase  to quetiapine IR 150 mg HS and XR 400 mg nightly. Continue lamotrigine.    06/11/21 appt:  [place] [facility] thinks she's doing goood. Doing very fine.  No trouble sleeping.  Sometimes in morning feels sad bc she knows she will have racing thoughts, but it resolves after a little while.  Racing thoughts since hospital.  Worries over money chronically. To bed 1015-7:30.  Maybe longer which is normal. H administers meds.  Some wordfinding problems.  Does journal which helps. No SE with meds.  Yesterday was hard with anxiety but today is better.  On lamotrigine 150 BID, SERoquel XR 400 + Seroquel IR 150 mg HS. Plan: Much better but not fully resolved.  Mixed manic sx including psychosis recently but not psychotic but insomnia last visit resolved with the following... apparently didn't tolerate Seroquel IR 200 mg HS well so increased quetiapine IR 150 mg HS and XR 400 mg nightly and now she is sleeping but still having some racing thoughts. Continue lamotrigine 150 BID.    07/14/2021 appointment with the following noted: Bad day yesterday feeling very depressed without anxiety. Not sleeping well lately.  Thinks it's related to less Seroquel than in the past.  Some EMA.  Always normal slept a lot. Compulsive cleaning and everything in it's place.  Arranges soap dispensers. Cleaning in the middle of the night. No recent psychotic sx but still racing thoughts and chronic $ worries. Still afraid to leave home.  Prior psychiatric medication trials include paroxetine, Lexapro,  risperidone 4 mg which was sedating, aripiprazole,  perphenazine,  Seroquel 1000, olanzapine for anxiety, and  lithium which was not tolerated even at very low dosages.  Topamax for anxiety, She was intolerant of both Namenda and Aricept.    Lamotrigine 300.   Xanax, Ativan, diazepam 5 mg BID   This is not an exhaustive list.   Psych hospitalization 04/2021 at [facility] for psychosis.  At the visit in February 2020 Emree was more acutely confused recently for no clear reason.   She had a negative medical work-up for causes of short-term memory problems and confusion.  Therefore the decision was made to try switching her from alprazolam to lorazepam at the lowest possible dose in hopes of improving her cognition.  This was successful and her cognition was improved significantly and her husband verified this.     Review of Systems:  Review of Systems  Eyes:  Positive for visual disturbance.  Cardiovascular:  Negative for chest pain and palpitations.  Musculoskeletal:  Positive for arthralgias, back pain and gait problem.  Neurological:  Positive for dizziness. Negative for tremors and weakness.  Psychiatric/Behavioral:  Positive for confusion. Negative for agitation, behavioral problems, decreased concentration, dysphoric mood, hallucinations, self-injury, sleep disturbance and suicidal ideas. The patient is nervous/anxious. The patient is not hyperactive.    Medications: I have reviewed the patient's current medications.  Current Outpatient Medications  Medication Sig Dispense Refill   acetaminophen (TYLENOL) 325 MG tablet Take 650 mg by mouth every 6 (six) hours as needed for mild pain or headache.     amLODipine (NORVASC) 5 MG tablet Take 5 mg by mouth every evening.     aspirin 81 MG tablet Take 81 mg by mouth daily.     cephALEXin (KEFLEX) 250 MG capsule Take 250 mg by mouth at bedtime.     esomeprazole (NEXIUM) 40 MG capsule Take 1 capsule (40 mg total) by mouth daily. 30 capsule 0   famotidine (PEPCID) 20 MG tablet Take  20 mg by mouth in the morning and at bedtime.     glimepiride (AMARYL) 1 MG tablet Take 1 mg by mouth daily.     lamoTRIgine (LAMICTAL) 150 MG tablet TAKE 1 TABLET TWICE DAILY 180 tablet 3    levocetirizine (XYZAL) 5 MG tablet Take 5 mg by mouth every evening.     levothyroxine (SYNTHROID) 100 MCG tablet Take 100 mcg by mouth daily before breakfast.     lisinopril (PRINIVIL,ZESTRIL) 20 MG tablet Take 20 mg by mouth daily.     meloxicam (MOBIC) 15 MG tablet Take 15 mg by mouth daily.     metoprolol succinate (TOPROL-XL) 50 MG 24 hr tablet Take 50 mg by mouth every evening. Take with or immediately following a meal.     Multiple Vitamin (MULTIVITAMIN) tablet Take 1 tablet by mouth daily.     polyethylene glycol (MIRALAX / GLYCOLAX) packet Take 17 g by mouth 2 (two) times daily as needed for mild constipation (MIX AS DIRECTED AND DRINK).     potassium chloride SA (K-DUR,KLOR-CON) 20 MEQ tablet Take 20 mEq by mouth daily.     QUEtiapine (SEROQUEL XR) 400 MG 24 hr tablet Take 1 tablet (400 mg total) by mouth at bedtime. 90 tablet 0   simvastatin (ZOCOR) 40 MG tablet Take 1 tablet (40 mg total) by mouth every evening. 30 tablet 0   busPIRone (BUSPAR) 30 MG tablet TAKE 1 TABLET TWICE DAILY (Patient not taking: Reported on 07/14/2021) 180 tablet 1   diclofenac sodium (VOLTAREN) 1 % GEL Apply 2 g topically 4 (four) times daily. Rub into affected area of foot 2 to 4 times daily (Patient not taking: Reported on 04/15/2021) 100 g 2   furosemide (LASIX) 20 MG tablet Take 20 mg by mouth daily as needed (for severe swelling). (Patient not taking: Reported on 06/11/2021)     lidocaine (LIDODERM) 5 % Place 1 patch onto the skin daily. Remove & Discard patch within 12 hours or as directed by MD (Patient not taking: Reported on 06/11/2021) 6 patch 0   meclizine (ANTIVERT) 25 MG tablet Take 1 tablet (25 mg total) by mouth 3 (three) times daily as needed for dizziness. (Patient not taking: Reported on 07/14/2021) 30 tablet 0   phenazopyridine (PYRIDIUM) 200 MG tablet Take 200 mg by mouth 3 (three) times daily as needed for pain.  (Patient not taking: Reported on 07/14/2021)     QUEtiapine (SEROQUEL) 300 MG  tablet Take 1 tablet (300 mg total) by mouth at bedtime. 90 tablet 0   No current facility-administered medications for this visit.    Medication Side Effects: None  Allergies:  Allergies  Allergen Reactions   Maxzide [Triamterene-Hctz] Other (See Comments)    Hurt patient's kidneys   Celexa [Citalopram Hydrobromide] Nausea Only   Metformin And Related Nausea And Vomiting   Other Nausea And Vomiting and Other (See Comments)    Unnamed medication hurt the patient's kidneys   Reglan [Metoclopramide] Nausea Only   Risperdal [Risperidone] Nausea Only and Other (See Comments)    Also caused hallucinations   Trazodone And Nefazodone Nausea Only   Zestril [Lisinopril] Nausea Only   Lithium Palpitations and Other (See Comments)    Shakes and delusional pt started seeing things      Past Medical History:  Diagnosis Date   Anxiety    Arthritis    Asthma    Bipolar 1 disorder (HCC)    CHF (congestive heart failure) (HCC)    Complication of anesthesia  left a bad taste in my mouth it has lasted since january    Depression    Diabetes mellitus    Hernia    umbilical   Hyperlipidemia    Hypertension    Hypothyroidism    Pericarditis, viral 2010 October   Sleep apnea    uses cpap setting of 15    Family History  Problem Relation Age of Onset   Diabetes Mother    Hypertension Mother    Hyperlipidemia Mother    Diabetes Father    Hypertension Father    Hyperlipidemia Father    Hypertension Sister     Social History   Socioeconomic History   Marital status: Married    Spouse name: Not on file   Number of children: Not on file   Years of education: Not on file   Highest education level: Not on file  Occupational History   Not on file  Tobacco Use   Smoking status: Never   Smokeless tobacco: Never  Substance and Sexual Activity   Alcohol use: No   Drug use: No   Sexual activity: Not on file  Other Topics Concern   Not on file  Social History Narrative   Not  on file   Social Determinants of Health   Financial Resource Strain: Not on file  Food Insecurity: Not on file  Transportation Needs: Not on file  Physical Activity: Not on file  Stress: Not on file  Social Connections: Not on file  Intimate Partner Violence: Not on file    Past Medical History, Surgical history, Social history, and Family history were reviewed and updated as appropriate.   Please see review of systems for further details on the patient's review from today.   Objective:   Physical Exam:  There were no vitals taken for this visit.  Physical Exam Constitutional:      General: She is not in acute distress. Musculoskeletal:        General: No deformity.  Neurological:     Mental Status: She is alert and oriented to person, place, and time.     Cranial Nerves: No dysarthria.     Coordination: Coordination normal.  Psychiatric:        Attention and Perception: Attention and perception normal. She does not perceive auditory or visual hallucinations.        Mood and Affect: Mood is anxious and depressed. Affect is not labile, blunt, angry, tearful or inappropriate.        Speech: Speech normal. Speech is not slurred.        Behavior: Behavior normal. Behavior is cooperative.        Thought Content: Thought content normal. Thought content is not paranoid or delusional. Thought content does not include homicidal or suicidal ideation. Thought content does not include homicidal or suicidal plan.        Cognition and Memory: Cognition is impaired. Memory is impaired.        Judgment: Judgment normal.     Comments: Insight fair and chronically stressed. Fair judgment. Depression is worse over the last week. Chronic $ worry.  Talkative chronically per usual.  Lab Review:     Component Value Date/Time   NA 143 04/15/2021 1831   K 4.2 04/15/2021 1831   CL 106 04/15/2021 1831   CO2 27 04/15/2021 1831   GLUCOSE 162 (H) 04/15/2021 1831   GLUCOSE 154 (H) 09/01/2006  1100   BUN 15 04/15/2021 1831   CREATININE 0.91 04/15/2021 1831  CREATININE 0.93 07/19/2011 1110   CALCIUM 11.1 (H) 04/15/2021 1831   PROT 7.5 04/15/2021 1831   ALBUMIN 4.2 04/15/2021 1831   AST 26 04/15/2021 1831   ALT 21 04/15/2021 1831   ALKPHOS 104 04/15/2021 1831   BILITOT 0.7 04/15/2021 1831   GFRNONAA >60 04/15/2021 1831   GFRAA >60 03/23/2016 1933       Component Value Date/Time   WBC 5.7 04/15/2021 1831   RBC 4.37 04/15/2021 1831   HGB 14.1 04/15/2021 1831   HCT 41.0 04/15/2021 1831   PLT 221 04/15/2021 1831   MCV 93.8 04/15/2021 1831   MCH 32.3 04/15/2021 1831   MCHC 34.4 04/15/2021 1831   RDW 13.7 04/15/2021 1831   LYMPHSABS 1.2 04/15/2021 1831   MONOABS 0.4 04/15/2021 1831   EOSABS 0.1 04/15/2021 1831   BASOSABS 0.1 04/15/2021 1831   Prior lamotrigine level in 2018 on this dosage was 3.6.  Not particularly high.  Per Dr. Marcos Eke: Clinical Impressions: Cognitive complaints are likely due to underlying psychiatric disorder (bipolar disorder/anxiety disorder with racing thoughts). Diagnostic impressions based on test performances are limited due to the patient's severe inability to fully attend to the tasks. However, based on clinical presentation, I highly doubt the presence of a neurodegenerative dementia. It is much more likely that her racing thoughts and psychiatric disorders are interfering with cognitive   No results found for: POCLITH, LITHIUM   No results found for: PHENYTOIN, PHENOBARB, VALPROATE, CBMZ   .res Assessment: Plan:    Bipolar affective disorder, mixed, severe, with psychotic behavior ([place]) - Plan: QUEtiapine (SEROQUEL) 300 MG tablet  Panic disorder with agoraphobia  Generalized anxiety disorder  Mild cognitive impairment  Claustrophobia  Insomnia due to mental condition  Low vitamin D level   Thayer Headings has chronic severe anxiety with panic attacks as well as bipolar disorder with a history of significant lability.  She has had more  severe instability with psychotic sx and cognitive problems off and on lately.  She remains chronically anxious and that has been worse lately.   Psych hospitalization 04/2021 at [facility] for psychosis. She also has mild cognitive impairment as a complicating factor.  She needs her husband's assistance to manage her medications.  She has  fallen several times and couldn't get up.  Much better but not fully resolved.   Mixed manic sx including psychosis recently but not psychotic but insomnia again and EMA with manic sx of cleaning in the middle of the night.   So agree to increase Seroquel IR 300 mg HS well and XR 400 mg nightly and now she is sleeping but still having some racing thoughts.  Continue lamotrigine 150 BID.    Consider switch to [place] bc lower SE and may help anxiety but probably would not be covered by insurance.  Discussed potential metabolic side effects associated with atypical antipsychotics, as well as potential risk for movement side effects. Advised pt to contact office if movement side effects occur.   Has been able to stop BZ and been without them since 04/26/21 so should be past the sig period of withdrawal.  Pt is chronically needy and requires extended appts DT chronic anxiety and easily stressed.  Supportive and cognitive work are done with the patient on her excessive guilt and now stress of vertigo.  Cannot drive now and wasn't much before.  Also addressing her fears related to her husband's diagnosis of cancer. Supportive therapy dealing with chronic stressors and difficulty with daughter.   Pt  doesn't feel able to fix her meds by herself. Disc her fears about going out and other phobias and disc behaviour therapy for I.t  No med changes this visit.  Patient is off benzodiazepine and just taking the 2 versions of quetiapine plus lamotrigine  FU 2 mos  [name], MD, DFAPA   Please see After Visit Summary for patient specific  instructions.  Future Appointments  Date Time Provider [place]  10/14/2021 11:45 AM [place], Ronalee Red, PA-C [facility]    No orders of the defined types were placed in this encounter.     -------------------------------

## 2021-07-28 ENCOUNTER — Encounter: Payer: Medicare HMO | Admitting: Psychology

## 2021-07-29 DIAGNOSIS — I10 Essential (primary) hypertension: Secondary | ICD-10-CM | POA: Diagnosis not present

## 2021-07-29 DIAGNOSIS — E1129 Type 2 diabetes mellitus with other diabetic kidney complication: Secondary | ICD-10-CM | POA: Diagnosis not present

## 2021-07-29 DIAGNOSIS — G4730 Sleep apnea, unspecified: Secondary | ICD-10-CM | POA: Diagnosis not present

## 2021-07-29 DIAGNOSIS — E7800 Pure hypercholesterolemia, unspecified: Secondary | ICD-10-CM | POA: Diagnosis not present

## 2021-07-29 DIAGNOSIS — K219 Gastro-esophageal reflux disease without esophagitis: Secondary | ICD-10-CM | POA: Diagnosis not present

## 2021-07-29 DIAGNOSIS — Z1239 Encounter for other screening for malignant neoplasm of breast: Secondary | ICD-10-CM | POA: Diagnosis not present

## 2021-07-29 DIAGNOSIS — E039 Hypothyroidism, unspecified: Secondary | ICD-10-CM | POA: Diagnosis not present

## 2021-07-29 DIAGNOSIS — F319 Bipolar disorder, unspecified: Secondary | ICD-10-CM | POA: Diagnosis not present

## 2021-08-15 ENCOUNTER — Other Ambulatory Visit: Payer: Self-pay | Admitting: Psychiatry

## 2021-08-15 DIAGNOSIS — F3164 Bipolar disorder, current episode mixed, severe, with psychotic features: Secondary | ICD-10-CM

## 2021-08-25 DIAGNOSIS — K439 Ventral hernia without obstruction or gangrene: Secondary | ICD-10-CM | POA: Diagnosis not present

## 2021-09-02 DIAGNOSIS — Z961 Presence of intraocular lens: Secondary | ICD-10-CM | POA: Diagnosis not present

## 2021-09-02 DIAGNOSIS — F319 Bipolar disorder, unspecified: Secondary | ICD-10-CM | POA: Diagnosis not present

## 2021-09-02 DIAGNOSIS — H52203 Unspecified astigmatism, bilateral: Secondary | ICD-10-CM | POA: Diagnosis not present

## 2021-09-02 DIAGNOSIS — H524 Presbyopia: Secondary | ICD-10-CM | POA: Diagnosis not present

## 2021-09-02 DIAGNOSIS — E119 Type 2 diabetes mellitus without complications: Secondary | ICD-10-CM | POA: Diagnosis not present

## 2021-09-20 ENCOUNTER — Ambulatory Visit (INDEPENDENT_AMBULATORY_CARE_PROVIDER_SITE_OTHER): Payer: Medicare HMO | Admitting: Psychiatry

## 2021-09-20 ENCOUNTER — Encounter: Payer: Self-pay | Admitting: Psychiatry

## 2021-09-20 ENCOUNTER — Other Ambulatory Visit: Payer: Self-pay | Admitting: Psychiatry

## 2021-09-20 DIAGNOSIS — R7989 Other specified abnormal findings of blood chemistry: Secondary | ICD-10-CM | POA: Diagnosis not present

## 2021-09-20 DIAGNOSIS — F411 Generalized anxiety disorder: Secondary | ICD-10-CM | POA: Diagnosis not present

## 2021-09-20 DIAGNOSIS — F4001 Agoraphobia with panic disorder: Secondary | ICD-10-CM | POA: Diagnosis not present

## 2021-09-20 DIAGNOSIS — G3184 Mild cognitive impairment, so stated: Secondary | ICD-10-CM | POA: Diagnosis not present

## 2021-09-20 DIAGNOSIS — F40240 Claustrophobia: Secondary | ICD-10-CM | POA: Diagnosis not present

## 2021-09-20 DIAGNOSIS — F5105 Insomnia due to other mental disorder: Secondary | ICD-10-CM

## 2021-09-20 DIAGNOSIS — F3164 Bipolar disorder, current episode mixed, severe, with psychotic features: Secondary | ICD-10-CM | POA: Diagnosis not present

## 2021-09-20 MED ORDER — QUETIAPINE FUMARATE ER 400 MG PO TB24: 400.0000 mg | ORAL_TABLET | Freq: Every day | ORAL | 0 refills | Status: DC

## 2021-09-20 MED ORDER — QUETIAPINE FUMARATE 300 MG PO TABS: 300.0000 mg | ORAL_TABLET | Freq: Every day | ORAL | 1 refills | Status: DC

## 2021-09-20 NOTE — Progress Notes (Signed)
[name] [id number] 12/17/1950 71 y.o.   Virtual Visit via Telephone Note  I connected with pt by telephone and verified that I am speaking with the correct person using two identifiers.   I discussed the limitations, risks, security and privacy concerns of performing an evaluation and management service by telephone and the availability of in person appointments. I also discussed with the patient that there may be a patient responsible charge related to this service. The patient expressed understanding and agreed to proceed.  I discussed the assessment and treatment plan with the patient. The patient was provided an opportunity to ask questions and all were answered. The patient agreed with the plan and demonstrated an understanding of the instructions.   The patient was advised to call back or seek an in-person evaluation if the symptoms worsen or if the condition fails to improve as anticipated.  I provided 30 minutes of non-face-to-face time during this encounter. The call started at 200 and ended at 230. The patient was located at home and the provider was located office.  Session from 2:00 until 2:30 PM  Subjective:   Patient ID:  [name] is a 71 y.o. (DOB [birth year]) female.  Chief Complaint:  Chief Complaint  Patient presents with   Follow-up    Bipolar affective disorder, mixed, severe, with psychotic behavior ([place])   Anxiety   Depression   Memory Loss    Anxiety Symptoms include confusion, dizziness and nervous/anxious behavior. Patient reports no decreased concentration, palpitations or suicidal ideas.       [name] presents to the office today for follow-up of anxiety and depression and poor cognition.  At her visit October 12, 2018.  Because of her poor cognition which did seem to get even worse lately with her memory and given a negative unremarkable medical work-up by her primary care doctor the decision was made to change her from Xanax  which she has been on for years to lorazepam.  Lorazepam often has less cognitive side effects than does alprazolam.  She had a lot of difficulty with headaches and insomnia with the transition.  She called several times in that transition.  Her husband reported that her cognition was better after the transition however.  She was satisfied with the Ativan.  There was an attempt to reduce her lamotrigine to 150 mg also in hopes of improving cognition but it was uncertain at the last visit where there she had actually done so.  visit December 2020 without med changes.  She had continued to do cognitively better off alprazolam and on lorazepam.  seen March 2021.   Better than December visit.  Feeling good and less down.  Adjusting time of med helped excessively sleep.  Brief hypomanic episode resolved where she cleaned all night.  Sleep 10 hours . No med changes.  12/23/19 appt.  Noted: More depression, crying and racing thoughts and distressed to the point of wondering if needed the hospital.  Sleep got worse and needed Seroquel IR 200 also bc went 2 days without sleep.  It worked and helped sleep.  Worry a lot and melancholy this week.  Cries over what happens dealing with her daugher.[name] has prostate and kidney cancer again.  Had surgery June 10, 2019 and she's cried a lot.  He's incontinent and very uncomfortable.  He usually has positive attitude.   Worry over his health and how she'll do if something happens to him.  She's afraid of Covid.  Trusting in God usually.  She realizes she  Needs to drive occasionally bc of his health.  [name] had prostate CA and lived to 74 yo.  Uncle died of it.  Not markedly depressed. Can't walk much dT plantar fascitis. Also stressed over [name] 69 yo not doing school work and causing problems.  Her father [name] is a bad parent and not helpful.  Stressed over [name], [name] and downs too and they have periodic conflict.   More down and anxious.   Guilt  tendency.   Patient reports difficulty with sleep initiation or maintenance in part DT anxiety and racing thoughts.Intermittent sleep problems with days and nights mixed up.  Not napping. Not manic.[name] Denies appetite disturbance.  Patient reports that energy and motivation have been good.  Patient has some difficulty with concentration.  Chronic memory problems.   Patient denies any suicidal ideation. Plan:   No changes in meds indicated except agree to take the extra Seroquel 200 IR HS for mixed manic sx that are worse right now.  May be having mixed seasonal sx.  02/20/2020 appointment with the following noted: Doing fairly good except chronic worry over [name].  Some days cries a lot over it.  D having a hard time lately.  Still easily stressed but not more than usual. Stopped extra quetiapine 200 mg HS bc no longer needed it. Anxiety is chronic but at baseline as is depression and not manic now. H helps her take the meds.   Memory is still bad but has been worse when on Xanax instead fo Ativan.  Sleep is fine from 10 to 9 which is much better than in the past.  Occ spells of staying up for 2 days and then needs to take quetiapine.   [name] goes for FU soon for cancer check up. Plan: no med changes  05/21/20 appt with following noted: "A lot of problems".  Can't drive bc fear and anxiety.  Needed to drive H and couldn't. Fall off toilet twice. Dizzy and HA 2 days ago with a lot of crying and stress with daughter. Leaving home even coming here causes anxiety.    Cataract surgery twice. Fear of Bill being diagnosed with recurrent cancer. Chronic anxiety greather than depression.   Don't know what to do with daughter. Sleep, appetite and energy are OK. Tolerating meds. Sees [name] one day weekly. H helps with meds bc she's forgetful and easily confused.  Loses train of thoughts. Dog has separation anxiety and so they don't go out much.  Plan: no med changes  08/18/20 appt with following  noted: 2 ER visitis with vertigo.  RX diazepam and meclizine. Rarely takes former but takes latter regularly. Shakes so bad it made her cry and scream.  Started PT. Continues lorazepam low dose 0.5 mg AM and 1.0 mg HS.  Still has dizziness and vertigo. Memory is still bad and H administers meds with pill box. Chronic anxiety.  Depression manageable. Sleep OK and tolerates meds otherwise. [name] driving her crazy. Plan: no med changes  11/17/20 appt with following noted: Has had periods of vertigo and meds for it.  Then had manic sx with cleaning and couldn't sleep for 24 hours. Stopped quetiapine 200 and continued quetiapine XR 800. Also started diazepam 5 mg BID for vertigo and continued lorazepam for anxiety.  No meclizine now.  Vertigo stopped and manic sx stopped.  Tolerating meds now. Still feels [name] is mean to her too often  and she will cry over it. Mandy's ExH [name] in jail and is the father of Mandy's D [name] 70 yo.  Will be there for 3 mos. Not markedly depressed.  But hates herself some days.  Still memory problems about the same.  Sleeping OK now.  Periods of mood swings. Easily anxious with relatives visiting.   Plan: Mixed manic sx resolved for now. No med changes today.  02/17/2021 appointment with the following noted: Not good.  "A whole lof of everything."  Shaking for mos. It comes and goes.  Shaking at times makes her think she'll have a nervous breakdown.   Went to ER and dx vertigo.  Has tried meclizine and Valium but scared to take it.  Big shakes again today.  Memory is not good. Sleep to escape anxiety.  Scared to leave home. Not sure what brings on the spells of shaking. Today sx hit her as soon as she awoke.  Plan: No med changes  03/12/2021 phone call: Patient with a lot of anxiety.  Husband reported the patient had been hallucinating and was paranoid that he had left her.  Some confusion over identity of people that were around her.  It was suggested that she have  medical work-up because it sounded like she had delirium and may have a UTI causing that. 03/17/2021 psychiatric hospitalization. 03/22/2021 MD response:  Note Patient has been very unstable with bipolar disorder mixed symptoms lately also with some delirium of unknown cause.  She will be seeing her primary care physician tomorrow.  Due to the severity of her symptoms it is suggested that she add haloperidol 2 mg nightly to her current quetiapine 800 mg nightly since we cannot increase the quetiapine dose further.  This may help with her confusion and agitation.  It is okay to talk with her husband because he administers her medication and because she is too confused to understand the instructions.     04/16/21 H called with following:  Pt.'s husband called.  She is currently in [place] [facility] waiting for an inpatient pyschiatric bed.  Pt advised her husband that Dr Clovis Pu told her to stop taking the Lorazepam.  He said he is not aware of that chang in her prescription.  He would like someone to call him and advise if there were any changes to her meds so he stays aware and can let the hospital know if needed. Husband was informed we had not made any medication changes and would not have suggested she abruptly stopped lorazepam which could cause withdrawal and other kinds of problems.  05/11/2021 appointment with the following noted:  spoke with H for info At hospital was taken off Ativan, buspirone, and Seroquel reduced to 500 mg HS (as 1 of XR 400 mg  and 1/2 of 200 mg IR quetiapine).  Had sedative SE at 200 mg queiapinte !R.  Off risperidone and haloperidol.   Hospitalized at [facility] for 9 days.  They didn't feel communication was good with them. But she is better now than she was.  Had been psychotic PTH.  Last 2-3 days getting more hyper and can't wind down and hard time going to sleep.  Came home 05/03/21. She didn't like the restrictions at the hospital.  No panic lately and wants  something to help her sleep. Plan; Mixed manic sx including psychosis recently but not psychotic now but insomnia.. Will get more psychotic if she doesn't sleep but apparently didn't tolerate Seroquel IR 200 mg  HS well so will increase to quetiapine IR 150 mg HS and XR 400 mg nightly. Continue lamotrigine.    06/11/21 appt:  [place] H thinks she's doing goood. Doing very fine.  No trouble sleeping.  Sometimes in morning feels sad bc she knows she will have racing thoughts, but it resolves after a little while.  Racing thoughts since hospital.  Worries over money chronically. To bed 1015-7:30.  Maybe longer which is normal. H administers meds.  Some wordfinding problems.  Does journal which helps. No SE with meds.  Yesterday was hard with anxiety but today is better.  On lamotrigine 150 BID, SERoquel XR 400 + Seroquel IR 150 mg HS. Plan: Much better but not fully resolved.  Mixed manic sx including psychosis recently but not psychotic but insomnia last visit resolved with the following... apparently didn't tolerate Seroquel IR 200 mg HS well so increased quetiapine IR 150 mg HS and XR 400 mg nightly and now she is sleeping but still having some racing thoughts. Continue lamotrigine 150 BID.    07/14/2021 appointment with the following noted: Bad day yesterday feeling very depressed without anxiety. Not sleeping well lately.  Thinks it's related to less Seroquel than in the past.  Some EMA.  Always normal slept a lot. Compulsive cleaning and everything in it's place.  Arranges soap dispensers. Cleaning in the middle of the night. No recent psychotic sx but still racing thoughts and chronic $ worries. Still afraid to leave home.  09/20/2021 appt noted: Great overall.  Occ insomnia with EMA.  Still worry but tries not let it consume me.  Still agoraphobia and hates to go out to dentists and doctors.  Challenge to go to dentist.  Can still dread things. Brief nap in daytimes. No SE H  administers meds. Seroquel 300 mg HS, XR 400 mg HS.  Lamotrigine 150 Patient reports stable mood and denies depressed or irritable moods.   Patient denies difficulty with sleep initiation or maintenance. Denies appetite disturbance.  Patient reports that energy and motivation have been good.  Patient denies any difficulty with concentration.  Patient denies any suicidal ideation.  Prior psychiatric medication trials include paroxetine, Lexapro,  risperidone 4 mg which was sedating, aripiprazole,  perphenazine,  Seroquel 1000, olanzapine for anxiety, and  lithium which was not tolerated even at very low dosages.  Topamax for anxiety, She was intolerant of both Namenda and Aricept.   Lamotrigine 300.   Xanax, Ativan, diazepam 5 mg BID   This is not an exhaustive list.   Psych hospitalization 04/2021 at [facility] for psychosis.  At the visit in February 2020 [name] was more acutely confused recently for no clear reason.   She had a negative medical work-up for causes of short-term memory problems and confusion.  Therefore the decision was made to try switching her from alprazolam to lorazepam at the lowest possible dose in hopes of improving her cognition.  This was successful and her cognition was improved significantly and her husband verified this.     Review of Systems:  Review of Systems  Eyes:  Positive for visual disturbance.  Cardiovascular:  Negative for palpitations.  Musculoskeletal:  Positive for arthralgias, back pain and gait problem.  Neurological:  Positive for dizziness. Negative for weakness.  Psychiatric/Behavioral:  Positive for confusion. Negative for agitation, behavioral problems, decreased concentration, dysphoric mood, hallucinations, self-injury, sleep disturbance and suicidal ideas. The patient is nervous/anxious. The patient is not hyperactive.    Medications: I have reviewed the  patient's current medications.  Current Outpatient Medications  Medication Sig  Dispense Refill   acetaminophen (TYLENOL) 325 MG tablet Take 650 mg by mouth every 6 (six) hours as needed for mild pain or headache.     amLODipine (NORVASC) 5 MG tablet Take 5 mg by mouth every evening.     aspirin 81 MG tablet Take 81 mg by mouth daily.     cephALEXin (KEFLEX) 250 MG capsule Take 250 mg by mouth at bedtime.     esomeprazole (NEXIUM) 40 MG capsule Take 1 capsule (40 mg total) by mouth daily. 30 capsule 0   famotidine (PEPCID) 20 MG tablet Take 20 mg by mouth in the morning and at bedtime.     furosemide (LASIX) 20 MG tablet Take 20 mg by mouth daily as needed (for severe swelling).     glimepiride (AMARYL) 1 MG tablet Take 1 mg by mouth daily.     lamoTRIgine (LAMICTAL) 150 MG tablet TAKE 1 TABLET TWICE DAILY 180 tablet 3   levocetirizine (XYZAL) 5 MG tablet Take 5 mg by mouth every evening.     levothyroxine (SYNTHROID) 100 MCG tablet Take 100 mcg by mouth daily before breakfast.     lisinopril (PRINIVIL,ZESTRIL) 20 MG tablet Take 20 mg by mouth daily.     meloxicam (MOBIC) 15 MG tablet Take 15 mg by mouth daily.     metoprolol succinate (TOPROL-XL) 50 MG 24 hr tablet Take 50 mg by mouth every evening. Take with or immediately following a meal.     Multiple Vitamin (MULTIVITAMIN) tablet Take 1 tablet by mouth daily.     polyethylene glycol (MIRALAX / GLYCOLAX) packet Take 17 g by mouth 2 (two) times daily as needed for mild constipation (MIX AS DIRECTED AND DRINK).     potassium chloride SA (K-DUR,KLOR-CON) 20 MEQ tablet Take 20 mEq by mouth daily.     simvastatin (ZOCOR) 40 MG tablet Take 1 tablet (40 mg total) by mouth every evening. 30 tablet 0   diclofenac sodium (VOLTAREN) 1 % GEL Apply 2 g topically 4 (four) times daily. Rub into affected area of foot 2 to 4 times daily (Patient not taking: Reported on 04/15/2021) 100 g 2   lidocaine (LIDODERM) 5 % Place 1 patch onto the skin daily. Remove & Discard patch within 12 hours or as directed by MD (Patient not taking: Reported  on 06/11/2021) 6 patch 0   meclizine (ANTIVERT) 25 MG tablet Take 1 tablet (25 mg total) by mouth 3 (three) times daily as needed for dizziness. (Patient not taking: Reported on 07/14/2021) 30 tablet 0   QUEtiapine (SEROQUEL XR) 400 MG 24 hr tablet Take 1 tablet (400 mg total) by mouth at bedtime. 90 tablet 0   QUEtiapine (SEROQUEL) 300 MG tablet Take 1 tablet (300 mg total) by mouth at bedtime. 90 tablet 1   No current facility-administered medications for this visit.    Medication Side Effects: None  Allergies:  Allergies  Allergen Reactions   Maxzide [Triamterene-Hctz] Other (See Comments)    Hurt patient's kidneys   Celexa [Citalopram Hydrobromide] Nausea Only   Metformin And Related Nausea And Vomiting   Other Nausea And Vomiting and Other (See Comments)    Unnamed medication hurt the patient's kidneys   Reglan [Metoclopramide] Nausea Only   Risperdal [Risperidone] Nausea Only and Other (See Comments)    Also caused hallucinations   Trazodone And Nefazodone Nausea Only   Zestril [Lisinopril] Nausea Only   Lithium Palpitations and Other (See  Comments)    Shakes and delusional pt started seeing things      Past Medical History:  Diagnosis Date   Anxiety    Arthritis    Asthma    Bipolar 1 disorder (HCC)    CHF (congestive heart failure) (HCC)    Complication of anesthesia    left a bad taste in my mouth it has lasted since january    Depression    Diabetes mellitus    Hernia    umbilical   Hyperlipidemia    Hypertension    Hypothyroidism    Pericarditis, viral 2010 October   Sleep apnea    uses cpap setting of 15    Family History  Problem Relation Age of Onset   Diabetes Mother    Hypertension Mother    Hyperlipidemia Mother    Diabetes Father    Hypertension Father    Hyperlipidemia Father    Hypertension Sister     Social History   Socioeconomic History   Marital status: Married    Spouse name: Not on file   Number of children: Not on file    Years of education: Not on file   Highest education level: Not on file  Occupational History   Not on file  Tobacco Use   Smoking status: Never   Smokeless tobacco: Never  Substance and Sexual Activity   Alcohol use: No   Drug use: No   Sexual activity: Not on file  Other Topics Concern   Not on file  Social History Narrative   Not on file   Social Determinants of Health   Financial Resource Strain: Not on file  Food Insecurity: Not on file  Transportation Needs: Not on file  Physical Activity: Not on file  Stress: Not on file  Social Connections: Not on file  Intimate Partner Violence: Not on file    Past Medical History, Surgical history, Social history, and Family history were reviewed and updated as appropriate.   Please see review of systems for further details on the patient's review from today.   Objective:   Physical Exam:  There were no vitals taken for this visit.  Physical Exam Constitutional:      General: She is not in acute distress. Musculoskeletal:        General: No deformity.  Neurological:     Mental Status: She is alert and oriented to person, place, and time.     Cranial Nerves: No dysarthria.     Coordination: Coordination normal.  Psychiatric:        Attention and Perception: Attention and perception normal. She does not perceive auditory or visual hallucinations.        Mood and Affect: Mood is anxious. Mood is not depressed. Affect is not labile, blunt, angry, tearful or inappropriate.        Speech: Speech normal. Speech is not slurred.        Behavior: Behavior normal. Behavior is cooperative.        Thought Content: Thought content normal. Thought content is not paranoid or delusional. Thought content does not include homicidal or suicidal ideation. Thought content does not include suicidal plan.        Cognition and Memory: Cognition is impaired. Memory is impaired.        Judgment: Judgment normal.     Comments: Insight fair and  chronically stressed. Fair judgment. Chronic $ worry.  Talkative chronically per usual.  Lab Review:     Component Value Date/Time  NA 143 04/15/2021 1831   K 4.2 04/15/2021 1831   CL 106 04/15/2021 1831   CO2 27 04/15/2021 1831   GLUCOSE 162 (H) 04/15/2021 1831   GLUCOSE 154 (H) 09/01/2006 1100   BUN 15 04/15/2021 1831   CREATININE 0.91 04/15/2021 1831   CREATININE 0.93 07/19/2011 1110   CALCIUM 11.1 (H) 04/15/2021 1831   PROT 7.5 04/15/2021 1831   ALBUMIN 4.2 04/15/2021 1831   AST 26 04/15/2021 1831   ALT 21 04/15/2021 1831   ALKPHOS 104 04/15/2021 1831   BILITOT 0.7 04/15/2021 1831   GFRNONAA >60 04/15/2021 1831   GFRAA >60 03/23/2016 1933       Component Value Date/Time   WBC 5.7 04/15/2021 1831   RBC 4.37 04/15/2021 1831   HGB 14.1 04/15/2021 1831   HCT 41.0 04/15/2021 1831   PLT 221 04/15/2021 1831   MCV 93.8 04/15/2021 1831   MCH 32.3 04/15/2021 1831   MCHC 34.4 04/15/2021 1831   RDW 13.7 04/15/2021 1831   LYMPHSABS 1.2 04/15/2021 1831   MONOABS 0.4 04/15/2021 1831   EOSABS 0.1 04/15/2021 1831   BASOSABS 0.1 04/15/2021 1831   Prior lamotrigine level in 2018 on this dosage was 3.6.  Not particularly high.  Per Dr. Marcos Eke: Clinical Impressions: Cognitive complaints are likely due to underlying psychiatric disorder (bipolar disorder/anxiety disorder with racing thoughts). Diagnostic impressions based on test performances are limited due to the patient's severe inability to fully attend to the tasks. However, based on clinical presentation, I highly doubt the presence of a neurodegenerative dementia. It is much more likely that her racing thoughts and psychiatric disorders are interfering with cognitive   No results found for: POCLITH, LITHIUM   No results found for: PHENYTOIN, PHENOBARB, VALPROATE, CBMZ   .res Assessment: Plan:    Bipolar affective disorder, mixed, severe, with psychotic behavior ([place]) - Plan: QUEtiapine (SEROQUEL XR) 400 MG 24 hr tablet,  QUEtiapine (SEROQUEL) 300 MG tablet  Panic disorder with agoraphobia  Generalized anxiety disorder  Mild cognitive impairment  Claustrophobia  Insomnia due to mental condition  Low vitamin D level   Thayer Headings has chronic severe anxiety with panic attacks as well as bipolar disorder with a history of significant lability.  She has had more severe instability with psychotic sx and cognitive problems off and on lately.  She remains chronically anxious and that has been worse lately.   Psych hospitalization 04/2021 at [facility] for psychosis. She also has mild cognitive impairment as a complicating factor.  She needs her husband's assistance to manage her medications.    Much better but not fully resolved.   Mixed manic sx including psychosis recently but not psychotic but insomnia again and EMA with manic sx of cleaning in the middle of the night.   So agree to increase Seroquel IR 300 mg HS well and XR 400 mg nightly and now she is sleeping but still having some racing thoughts.  Continue lamotrigine 150 BID.    Consider switch to [place] bc lower SE and may help anxiety but probably would not be covered by insurance.  Discussed potential metabolic side effects associated with atypical antipsychotics, as well as potential risk for movement side effects. Advised pt to contact office if movement side effects occur.   Has been able to stop BZ and been without them since 04/26/21. Last filled lorazepam 03/2021  Pt is chronically needy and requires extended appts DT chronic anxiety and easily stressed.  Supportive and cognitive work are done with  the patient on her excessive guilt and now stress of vertigo.  Cannot drive now and wasn't much before.  Also addressing her fears related to her husband's diagnosis of cancer. Supportive therapy dealing with chronic stressors and difficulty with daughter.   Pt doesn't feel able to fix her meds by herself. Disc her fears about going out and other  phobias and disc behaviour therapy for I.t  No med changes this visit.  Patient is off benzodiazepine and just taking the 2 versions of quetiapine plus lamotrigine  FU 4 mos bc has been more stable  [name], MD, DFAPA   Please see After Visit Summary for patient specific instructions.  Future Appointments  Date Time Provider [place]  10/14/2021 11:45 AM [place], Ronalee Red, PA-C [facility]    No orders of the defined types were placed in this encounter.      -------------------------------

## 2021-09-22 DIAGNOSIS — R350 Frequency of micturition: Secondary | ICD-10-CM | POA: Diagnosis not present

## 2021-09-22 DIAGNOSIS — N3020 Other chronic cystitis without hematuria: Secondary | ICD-10-CM | POA: Diagnosis not present

## 2021-10-14 ENCOUNTER — Encounter: Payer: Self-pay | Admitting: Physician Assistant

## 2021-10-14 ENCOUNTER — Ambulatory Visit: Payer: Medicare HMO | Admitting: Physician Assistant

## 2021-10-14 ENCOUNTER — Other Ambulatory Visit: Payer: Self-pay

## 2021-10-14 DIAGNOSIS — B079 Viral wart, unspecified: Secondary | ICD-10-CM

## 2021-10-14 DIAGNOSIS — Z1283 Encounter for screening for malignant neoplasm of skin: Secondary | ICD-10-CM | POA: Diagnosis not present

## 2021-10-14 DIAGNOSIS — L820 Inflamed seborrheic keratosis: Secondary | ICD-10-CM | POA: Diagnosis not present

## 2021-10-20 DIAGNOSIS — Z0100 Encounter for examination of eyes and vision without abnormal findings: Secondary | ICD-10-CM | POA: Diagnosis not present

## 2021-10-20 DIAGNOSIS — H524 Presbyopia: Secondary | ICD-10-CM | POA: Diagnosis not present

## 2021-11-02 ENCOUNTER — Encounter: Payer: Self-pay | Admitting: Physician Assistant

## 2021-11-16 ENCOUNTER — Telehealth: Payer: Self-pay | Admitting: Psychiatry

## 2021-11-16 NOTE — Telephone Encounter (Signed)
Please see message from patient's husband.  ?

## 2021-11-16 NOTE — Telephone Encounter (Signed)
Jeneen Rinks , Husband, called and reported that [name] is having difficulty sleeping, having racing thoughts and increase in OCD. Last couple of weeks, esp last 3-4 days, with going to sleep and staying asleep. ?Can he increase Seroquel by '100mg'$  to help with sleep? Or something else. ?Uses [facility] [address] ?

## 2021-11-16 NOTE — Telephone Encounter (Signed)
OK to increase quetiapine IR by 100-150 mg nightly using the supply of either he has. ?

## 2021-11-17 NOTE — Telephone Encounter (Signed)
Husband notified. Told him to call when supply gets low so we can send in a new Rx.  ?

## 2022-01-20 ENCOUNTER — Ambulatory Visit (INDEPENDENT_AMBULATORY_CARE_PROVIDER_SITE_OTHER): Payer: Medicare HMO | Admitting: Psychiatry

## 2022-01-20 ENCOUNTER — Encounter: Payer: Self-pay | Admitting: Psychiatry

## 2022-01-20 DIAGNOSIS — F411 Generalized anxiety disorder: Secondary | ICD-10-CM

## 2022-01-20 DIAGNOSIS — F40240 Claustrophobia: Secondary | ICD-10-CM | POA: Diagnosis not present

## 2022-01-20 DIAGNOSIS — F5105 Insomnia due to other mental disorder: Secondary | ICD-10-CM

## 2022-01-20 DIAGNOSIS — F4001 Agoraphobia with panic disorder: Secondary | ICD-10-CM | POA: Diagnosis not present

## 2022-01-20 DIAGNOSIS — F3164 Bipolar disorder, current episode mixed, severe, with psychotic features: Secondary | ICD-10-CM

## 2022-01-20 DIAGNOSIS — R7989 Other specified abnormal findings of blood chemistry: Secondary | ICD-10-CM | POA: Diagnosis not present

## 2022-01-20 DIAGNOSIS — G3184 Mild cognitive impairment, so stated: Secondary | ICD-10-CM

## 2022-01-20 MED ORDER — QUETIAPINE FUMARATE ER 400 MG PO TB24: 400.0000 mg | ORAL_TABLET | Freq: Every day | ORAL | 1 refills | Status: DC

## 2022-01-20 NOTE — Progress Notes (Signed)
[name] [id number] 1951-02-19 71 y.o.   Virtual Visit via Telephone Note  I connected with pt by telephone and verified that I am speaking with the correct person using two identifiers.   I discussed the limitations, risks, security and privacy concerns of performing an evaluation and management service by telephone and the availability of in person appointments. I also discussed with the patient that there may be a patient responsible charge related to this service. The patient expressed understanding and agreed to proceed.  I discussed the assessment and treatment plan with the patient. The patient was provided an opportunity to ask questions and all were answered. The patient agreed with the plan and demonstrated an understanding of the instructions.   The patient was advised to call back or seek an in-person evaluation if the symptoms worsen or if the condition fails to improve as anticipated.  I provided 30 minutes of non-face-to-face time during this encounter. The call started at 200 and ended at 230. The patient was located at home and the provider was located office.  Session from 300 until 330  Subjective:   Patient ID:  [name] is a 71 y.o. (DOB 1951/04/30) female.  Chief Complaint:  Chief Complaint  Patient presents with   Follow-up    Bipolar affective disorder, mixed, severe, with psychotic behavior ([place])    Anxiety Symptoms include confusion, dizziness and nervous/anxious behavior. Patient reports no decreased concentration, palpitations or suicidal ideas.       [name] presents to the office today for follow-up of anxiety and depression and poor cognition.  At her visit October 12, 2018.  Because of her poor cognition which did seem to get even worse lately with her memory and given a negative unremarkable medical work-up by her primary care doctor the decision was made to change her from Xanax which she has been on for years to lorazepam.   Lorazepam often has less cognitive side effects than does alprazolam.  She had a lot of difficulty with headaches and insomnia with the transition.  She called several times in that transition.  Her husband reported that her cognition was better after the transition however.  She was satisfied with the Ativan.  There was an attempt to reduce her lamotrigine to 150 mg also in hopes of improving cognition but it was uncertain at the last visit where there she had actually done so.  visit December 2020 without med changes.  She had continued to do cognitively better off alprazolam and on lorazepam.  seen March 2021.   Better than December visit.  Feeling good and less down.  Adjusting time of med helped excessively sleep.  Brief hypomanic episode resolved where she cleaned all night.  Sleep 10 hours . No med changes.  12/23/19 appt.  Noted: More depression, crying and racing thoughts and distressed to the point of wondering if needed the hospital.  Sleep got worse and needed Seroquel IR 200 also bc went 2 days without sleep.  It worked and helped sleep.  Worry a lot and melancholy this week.  Cries over what happens dealing with her daugher.[name] has prostate and kidney cancer again.  Had surgery June 10, 2019 and she's cried a lot.  He's incontinent and very uncomfortable.  He usually has positive attitude.   Worry over his health and how she'll do if something happens to him.  She's afraid of Covid.  Trusting in God usually.  She realizes she  Needs to  drive occasionally bc of his health.  [name]'s father had prostate CA and lived to 51 yo.  Uncle died of it.  Not markedly depressed. Can't walk much dT plantar fascitis. Also stressed over [name] 69 yo not doing school work and causing problems.  Her father [name] is a bad parent and not helpful.  Stressed over [name] and downs too and they have periodic conflict.   More down and anxious.   Guilt tendency.   Patient reports difficulty with sleep  initiation or maintenance in part DT anxiety and racing thoughts.Intermittent sleep problems with days and nights mixed up.  Not napping. Not manic.[name] Denies appetite disturbance.  Patient reports that energy and motivation have been good.  Patient has some difficulty with concentration.  Chronic memory problems.   Patient denies any suicidal ideation. Plan:   No changes in meds indicated except agree to take the extra Seroquel 200 IR HS for mixed manic sx that are worse right now.  May be having mixed seasonal sx.  02/20/2020 appointment with the following noted: Doing fairly good except chronic worry over [name] and Yolanda [name].  Some days cries a lot over it.  [name] having a hard time lately.  Still easily stressed but not more than usual. Stopped extra quetiapine 200 mg HS bc no longer needed it. Anxiety is chronic but at baseline as is depression and not manic now. H helps her take the meds.   Memory is still bad but has been worse when on Xanax instead fo Ativan.  Sleep is fine from 10 to 9 which is much better than in the past.  Occ spells of staying up for 2 days and then needs to take quetiapine.   H [name] goes for FU soon for cancer check up. Plan: no med changes  05/21/20 appt with following noted: "A lot of problems".  Can't drive bc fear and anxiety.  Needed to drive H and couldn't. Fall off toilet twice. Dizzy and HA 2 days ago with a lot of crying and stress with daughter. Leaving home even coming here causes anxiety.    Cataract surgery twice. Fear of [name] being diagnosed with recurrent cancer. Chronic anxiety greather than depression.   Don't know what to do with daughter. Sleep, appetite and energy are OK. Tolerating meds. Sees [name] one day weekly. H helps with meds bc she's forgetful and easily confused.  Loses train of thoughts. Dog has separation anxiety and so they don't go out much.  Plan: no med changes  08/18/20 appt with following noted: 2 ER visitis with vertigo.  RX diazepam  and meclizine. Rarely takes former but takes latter regularly. Shakes so bad it made her cry and scream.  Started PT. Continues lorazepam low dose 0.5 mg AM and 1.0 mg HS.  Still has dizziness and vertigo. Memory is still bad and H administers meds with pill box. Chronic anxiety.  Depression manageable. Sleep OK and tolerates meds otherwise. [name] driving her crazy. Plan: no med changes  11/17/20 appt with following noted: Has had periods of vertigo and meds for it.  Then had manic sx with cleaning and couldn't sleep for 24 hours. Stopped quetiapine 200 and continued quetiapine XR 800. Also started diazepam 5 mg BID for vertigo and continued lorazepam for anxiety.  No meclizine now.  Vertigo stopped and manic sx stopped.  Tolerating meds now. Still feels [name] is mean to her too often and she will cry over it. Mandy's ExH [name] in jail  and is the father of Mandy's [name] [name] 69 yo.  Will be there for 3 mos. Not markedly depressed.  But hates herself some days.  Still memory problems about the same.  Sleeping OK now.  Periods of mood swings. Easily anxious with relatives visiting.   Plan: Mixed manic sx resolved for now. No med changes today.  02/17/2021 appointment with the following noted: Not good.  "A whole lof of everything."  Shaking for mos. It comes and goes.  Shaking at times makes her think she'll have a nervous breakdown.   Went to ER and dx vertigo.  Has tried meclizine and Valium but scared to take it.  Big shakes again today.  Memory is not good. Sleep to escape anxiety.  Scared to leave home. Not sure what brings on the spells of shaking. Today sx hit her as soon as she awoke.  Plan: No med changes  03/12/2021 phone call: Patient with a lot of anxiety.  Husband reported the patient had been hallucinating and was paranoid that he had left her.  Some confusion over identity of people that were around her.  It was suggested that she have medical work-up because it sounded like she had  delirium and may have a UTI causing that. 03/17/2021 psychiatric hospitalization. 03/22/2021 MD response:  Note Patient has been very unstable with bipolar disorder mixed symptoms lately also with some delirium of unknown cause.  She will be seeing her primary care physician tomorrow.  Due to the severity of her symptoms it is suggested that she add haloperidol 2 mg nightly to her current quetiapine 800 mg nightly since we cannot increase the quetiapine dose further.  This may help with her confusion and agitation.  It is okay to talk with her husband because he administers her medication and because she is too confused to understand the instructions.     04/16/21 H called with following:  Pt.'s husband called.  She is currently in [place] [facility] waiting for an inpatient pyschiatric bed.  Pt advised her husband that Dr Clovis Pu told her to stop taking the Lorazepam.  He said he is not aware of that chang in her prescription.  He would like someone to call him and advise if there were any changes to her meds so he stays aware and can let the hospital know if needed. Husband was informed we had not made any medication changes and would not have suggested she abruptly stopped lorazepam which could cause withdrawal and other kinds of problems.  05/11/2021 appointment with the following noted:  spoke with H for info At hospital was taken off Ativan, buspirone, and Seroquel reduced to 500 mg HS (as 1 of XR 400 mg  and 1/2 of 200 mg IR quetiapine).  Had sedative SE at 200 mg queiapinte !R.  Off risperidone and haloperidol.   Hospitalized at [facility] for 9 days.  They didn't feel communication was good with them. But she is better now than she was.  Had been psychotic PTH.  Last 2-3 days getting more hyper and can't wind down and hard time going to sleep.  Came home 05/03/21. She didn't like the restrictions at the hospital.  No panic lately and wants something to help her sleep. Plan; Mixed manic sx  including psychosis recently but not psychotic now but insomnia.. Will get more psychotic if she doesn't sleep but apparently didn't tolerate Seroquel IR 200 mg HS well so will increase to quetiapine IR 150 mg HS  and XR 400 mg nightly. Continue lamotrigine.    06/11/21 appt:  [place] [facility] thinks she's doing goood. Doing very fine.  No trouble sleeping.  Sometimes in morning feels sad bc she knows she will have racing thoughts, but it resolves after a little while.  Racing thoughts since hospital.  Worries over money chronically. To bed 1015-7:30.  Maybe longer which is normal. H administers meds.  Some wordfinding problems.  Does journal which helps. No SE with meds.  Yesterday was hard with anxiety but today is better.  On lamotrigine 150 BID, SERoquel XR 400 + Seroquel IR 150 mg HS. Plan: Much better but not fully resolved.  Mixed manic sx including psychosis recently but not psychotic but insomnia last visit resolved with the following... apparently didn't tolerate Seroquel IR 200 mg HS well so increased quetiapine IR 150 mg HS and XR 400 mg nightly and now she is sleeping but still having some racing thoughts. Continue lamotrigine 150 BID.    07/14/2021 appointment with the following noted: Bad day yesterday feeling very depressed without anxiety. Not sleeping well lately.  Thinks it's related to less Seroquel than in the past.  Some EMA.  Always normal slept a lot. Compulsive cleaning and everything in it's place.  Arranges soap dispensers. Cleaning in the middle of the night. No recent psychotic sx but still racing thoughts and chronic $ worries. Still afraid to leave home.  09/20/2021 appt noted: Great overall.  Occ insomnia with EMA.  Still worry but tries not let it consume me.  Still agoraphobia and hates to go out to dentists and doctors.  Challenge to go to dentist.  Can still dread things. Brief nap in daytimes. No SE H administers meds. Seroquel 300 mg HS, XR 400 mg HS.   Lamotrigine 150 Patient reports stable mood and denies depressed or irritable moods.   Patient denies difficulty with sleep initiation or maintenance. Denies appetite disturbance.  Patient reports that energy and motivation have been good.  Patient denies any difficulty with concentration.  Patient denies any suicidal ideation.  01/20/22 appt noted: also talked with [name] Doesn't like waiting 4 mos between appts. Was manic before increasing Seroquel with irritability, not sleeping a couple of days and excessive cleaning. Seroquel increased to 400 mg HS.  No SE I feel much Better now.  Doing better at time with anxiety.   Chronic worry over [name] and Yolanda. Yolanda with psych problems. Sleeping good again. Racing thoughts are better but not gone Tolerating meds.   No further changes desired.    Prior psychiatric medication trials include paroxetine, Lexapro,  risperidone 4 mg which was sedating, aripiprazole,  perphenazine,  Seroquel 1000, olanzapine for anxiety, and  lithium which was not tolerated even at very low dosages.  Topamax for anxiety, She was intolerant of both Namenda and Aricept.   Lamotrigine 300.   Xanax, Ativan, diazepam 5 mg BID   This is not an exhaustive list.   Psych hospitalization 04/2021 at [facility] for psychosis.  At the visit in February 2020 Jaedah was more acutely confused recently for no clear reason.   She had a negative medical work-up for causes of short-term memory problems and confusion.  Therefore the decision was made to try switching her from alprazolam to lorazepam at the lowest possible dose in hopes of improving her cognition.  This was successful and her cognition was improved significantly and her husband verified this.     Review of Systems:  Review of  Systems  Eyes:  Positive for visual disturbance.  Cardiovascular:  Negative for palpitations.  Musculoskeletal:  Positive for arthralgias, back pain and gait problem.  Neurological:  Positive for  dizziness. Negative for weakness.  Psychiatric/Behavioral:  Positive for confusion. Negative for agitation, behavioral problems, decreased concentration, dysphoric mood, hallucinations, self-injury, sleep disturbance and suicidal ideas. The patient is nervous/anxious. The patient is not hyperactive.    Medications: I have reviewed the patient's current medications.  Current Outpatient Medications  Medication Sig Dispense Refill   acetaminophen (TYLENOL) 325 MG tablet Take 650 mg by mouth every 6 (six) hours as needed for mild pain or headache.     amLODipine (NORVASC) 5 MG tablet Take 5 mg by mouth every evening.     aspirin 81 MG tablet Take 81 mg by mouth daily.     cephALEXin (KEFLEX) 250 MG capsule Take 250 mg by mouth at bedtime.     diclofenac sodium (VOLTAREN) 1 % GEL Apply 2 g topically 4 (four) times daily. Rub into affected area of foot 2 to 4 times daily 100 g 2   esomeprazole (NEXIUM) 40 MG capsule Take 1 capsule (40 mg total) by mouth daily. 30 capsule 0   famotidine (PEPCID) 20 MG tablet Take 20 mg by mouth in the morning and at bedtime.     furosemide (LASIX) 20 MG tablet Take 20 mg by mouth daily as needed (for severe swelling).     glimepiride (AMARYL) 1 MG tablet Take 1 mg by mouth daily.     lamoTRIgine (LAMICTAL) 150 MG tablet TAKE 1 TABLET TWICE DAILY 180 tablet 3   levocetirizine (XYZAL) 5 MG tablet Take 5 mg by mouth every evening.     levothyroxine (SYNTHROID) 100 MCG tablet Take 100 mcg by mouth daily before breakfast.     lidocaine (LIDODERM) 5 % Place 1 patch onto the skin daily. Remove & Discard patch within 12 hours or as directed by MD 6 patch 0   lisinopril (PRINIVIL,ZESTRIL) 20 MG tablet Take 20 mg by mouth daily.     meloxicam (MOBIC) 15 MG tablet Take 15 mg by mouth daily.     metoprolol succinate (TOPROL-XL) 50 MG 24 hr tablet Take 50 mg by mouth every evening. Take with or immediately following a meal.     Multiple Vitamin (MULTIVITAMIN) tablet Take 1 tablet  by mouth daily.     polyethylene glycol (MIRALAX / GLYCOLAX) packet Take 17 g by mouth 2 (two) times daily as needed for mild constipation (MIX AS DIRECTED AND DRINK).     potassium chloride SA (K-DUR,KLOR-CON) 20 MEQ tablet Take 20 mEq by mouth daily.     QUEtiapine (SEROQUEL XR) 400 MG 24 hr tablet Take 1 tablet (400 mg total) by mouth at bedtime. 90 tablet 0   QUEtiapine (SEROQUEL) 100 MG tablet Take 100 mg by mouth at bedtime. PRN     QUEtiapine (SEROQUEL) 300 MG tablet Take 1 tablet (300 mg total) by mouth at bedtime. 90 tablet 1   simvastatin (ZOCOR) 40 MG tablet Take 1 tablet (40 mg total) by mouth every evening. 30 tablet 0   meclizine (ANTIVERT) 25 MG tablet Take 1 tablet (25 mg total) by mouth 3 (three) times daily as needed for dizziness. (Patient not taking: Reported on 01/20/2022) 30 tablet 0   No current facility-administered medications for this visit.    Medication Side Effects: None  Allergies:  Allergies  Allergen Reactions   Maxzide [Triamterene-Hctz] Other (See Comments)    Hurt  patient's kidneys   Celexa [Citalopram Hydrobromide] Nausea Only   Metformin And Related Nausea And Vomiting   Other Nausea And Vomiting and Other (See Comments)    Unnamed medication hurt the patient's kidneys   Reglan [Metoclopramide] Nausea Only   Risperdal [Risperidone] Nausea Only and Other (See Comments)    Also caused hallucinations   Trazodone And Nefazodone Nausea Only   Zestril [Lisinopril] Nausea Only   Lithium Palpitations and Other (See Comments)    Shakes and delusional pt started seeing things      Past Medical History:  Diagnosis Date   Anxiety    Arthritis    Asthma    Bipolar 1 disorder (HCC)    CHF (congestive heart failure) (HCC)    Complication of anesthesia    left a bad taste in my mouth it has lasted since january    Depression    Diabetes mellitus    Hernia    umbilical   Hyperlipidemia    Hypertension    Hypothyroidism    Pericarditis, viral 2010  October   Sleep apnea    uses cpap setting of 15    Family History  Problem Relation Age of Onset   Diabetes Mother    Hypertension Mother    Hyperlipidemia Mother    Diabetes Father    Hypertension Father    Hyperlipidemia Father    Hypertension Sister     Social History   Socioeconomic History   Marital status: Married    Spouse name: Not on file   Number of children: Not on file   Years of education: Not on file   Highest education level: Not on file  Occupational History   Not on file  Tobacco Use   Smoking status: Never   Smokeless tobacco: Never  Substance and Sexual Activity   Alcohol use: No   Drug use: No   Sexual activity: Not on file  Other Topics Concern   Not on file  Social History Narrative   Not on file   Social Determinants of Health   Financial Resource Strain: Not on file  Food Insecurity: Not on file  Transportation Needs: Not on file  Physical Activity: Not on file  Stress: Not on file  Social Connections: Not on file  Intimate Partner Violence: Not on file    Past Medical History, Surgical history, Social history, and Family history were reviewed and updated as appropriate.   Please see review of systems for further details on the patient's review from today.   Objective:   Physical Exam:  There were no vitals taken for this visit.  Physical Exam Neurological:     Mental Status: She is alert and oriented to person, place, and time.     Cranial Nerves: No dysarthria.  Psychiatric:        Attention and Perception: Attention and perception normal.        Mood and Affect: Mood is anxious. Mood is not depressed.        Speech: Speech normal. Speech is not rapid and pressured.        Behavior: Behavior is cooperative.        Thought Content: Thought content normal. Thought content is not paranoid or delusional. Thought content does not include homicidal or suicidal ideation. Thought content does not include suicidal plan.         Cognition and Memory: Cognition and memory normal.        Judgment: Judgment normal.  Comments: Insight intact  Talkative chronically per usual.  Lab Review:     Component Value Date/Time   NA 143 04/15/2021 1831   K 4.2 04/15/2021 1831   CL 106 04/15/2021 1831   CO2 27 04/15/2021 1831   GLUCOSE 162 (H) 04/15/2021 1831   GLUCOSE 154 (H) 09/01/2006 1100   BUN 15 04/15/2021 1831   CREATININE 0.91 04/15/2021 1831   CREATININE 0.93 07/19/2011 1110   CALCIUM 11.1 (H) 04/15/2021 1831   PROT 7.5 04/15/2021 1831   ALBUMIN 4.2 04/15/2021 1831   AST 26 04/15/2021 1831   ALT 21 04/15/2021 1831   ALKPHOS 104 04/15/2021 1831   BILITOT 0.7 04/15/2021 1831   GFRNONAA >60 04/15/2021 1831   GFRAA >60 03/23/2016 1933       Component Value Date/Time   WBC 5.7 04/15/2021 1831   RBC 4.37 04/15/2021 1831   HGB 14.1 04/15/2021 1831   HCT 41.0 04/15/2021 1831   PLT 221 04/15/2021 1831   MCV 93.8 04/15/2021 1831   MCH 32.3 04/15/2021 1831   MCHC 34.4 04/15/2021 1831   RDW 13.7 04/15/2021 1831   LYMPHSABS 1.2 04/15/2021 1831   MONOABS 0.4 04/15/2021 1831   EOSABS 0.1 04/15/2021 1831   BASOSABS 0.1 04/15/2021 1831   Prior lamotrigine level in 2018 on this dosage was 3.6.  Not particularly high.  Per Dr. Marcos Eke: Clinical Impressions: Cognitive complaints are likely due to underlying psychiatric disorder (bipolar disorder/anxiety disorder with racing thoughts). Diagnostic impressions based on test performances are limited due to the patient's severe inability to fully attend to the tasks. However, based on clinical presentation, I highly doubt the presence of a neurodegenerative dementia. It is much more likely that her racing thoughts and psychiatric disorders are interfering with cognitive   No results found for: POCLITH, LITHIUM   No results found for: PHENYTOIN, PHENOBARB, VALPROATE, CBMZ   .res Assessment: Plan:    Bipolar affective disorder, mixed, severe, with psychotic behavior  ([place])  Panic disorder with agoraphobia  Generalized anxiety disorder  Mild cognitive impairment  Claustrophobia  Insomnia due to mental condition  Low vitamin [name] level   Thayer Headings has chronic severe anxiety with panic attacks as well as bipolar disorder with a history of significant lability.  She has had more severe instability with psychotic sx and cognitive problems off and on lately.  She remains chronically anxious and that has been worse lately.   Psych hospitalization 04/2021 at [facility] for psychosis. She also has mild cognitive impairment as a complicating factor.  She needs her husband's assistance to manage her medications.    Much better mania and anxiety managebale but never gone.  So agree to increase Seroquel IR 400 mg HS well and XR 400 mg nightly abc recent manic sx better with increase dose.  Continue lamotrigine 150 BID.    Consider switch to [place] bc lower SE and may help anxiety but probably would not be covered by insurance.  Discussed potential metabolic side effects associated with atypical antipsychotics, as well as potential risk for movement side effects. Advised pt to contact office if movement side effects occur.  Disc family's fear of LT SE.  However current meds work and without them sx unmanageable.  Has been able to stop BZ and been without them since 04/26/21. Last filled lorazepam 03/2021  Pt is chronically needy and requires extended appts DT chronic anxiety and easily stressed.  Supportive and cognitive work are done with the patient on her excessive guilt and  now stress of vertigo.  Cannot drive now and wasn't much before.  Also addressing her fears related to her husband's diagnosis of cancer. Supportive therapy dealing with chronic stressors and difficulty with daughter.   Pt doesn't feel able to fix her meds by herself. Disc her fears about going out and other phobias and disc behaviour therapy for I.t  No med changes this visit.  Patient  is off benzodiazepine and just taking the 2 versions of quetiapine plus lamotrigine  FU 3 mos   [name], MD, DFAPA   Please see After Visit Summary for patient specific instructions.  No future appointments.   No orders of the defined types were placed in this encounter.      -------------------------------

## 2022-01-28 DIAGNOSIS — E1129 Type 2 diabetes mellitus with other diabetic kidney complication: Secondary | ICD-10-CM | POA: Diagnosis not present

## 2022-01-28 DIAGNOSIS — E7800 Pure hypercholesterolemia, unspecified: Secondary | ICD-10-CM | POA: Diagnosis not present

## 2022-01-28 DIAGNOSIS — I10 Essential (primary) hypertension: Secondary | ICD-10-CM | POA: Diagnosis not present

## 2022-01-28 DIAGNOSIS — N183 Chronic kidney disease, stage 3 unspecified: Secondary | ICD-10-CM | POA: Diagnosis not present

## 2022-02-08 DIAGNOSIS — E1129 Type 2 diabetes mellitus with other diabetic kidney complication: Secondary | ICD-10-CM | POA: Diagnosis not present

## 2022-02-08 DIAGNOSIS — F319 Bipolar disorder, unspecified: Secondary | ICD-10-CM | POA: Diagnosis not present

## 2022-02-08 DIAGNOSIS — E7800 Pure hypercholesterolemia, unspecified: Secondary | ICD-10-CM | POA: Diagnosis not present

## 2022-02-08 DIAGNOSIS — K219 Gastro-esophageal reflux disease without esophagitis: Secondary | ICD-10-CM | POA: Diagnosis not present

## 2022-02-08 DIAGNOSIS — Z0000 Encounter for general adult medical examination without abnormal findings: Secondary | ICD-10-CM | POA: Diagnosis not present

## 2022-02-08 DIAGNOSIS — I10 Essential (primary) hypertension: Secondary | ICD-10-CM | POA: Diagnosis not present

## 2022-02-08 DIAGNOSIS — G4730 Sleep apnea, unspecified: Secondary | ICD-10-CM | POA: Diagnosis not present

## 2022-02-08 DIAGNOSIS — E039 Hypothyroidism, unspecified: Secondary | ICD-10-CM | POA: Diagnosis not present

## 2022-03-08 ENCOUNTER — Telehealth: Payer: Self-pay | Admitting: Psychiatry

## 2022-03-08 ENCOUNTER — Other Ambulatory Visit: Payer: Self-pay | Admitting: Psychiatry

## 2022-03-08 NOTE — Telephone Encounter (Signed)
Husband called and said that they need a refill of seroquel 200 mg sent to [facility]. Please call him at [phone]

## 2022-03-08 NOTE — Telephone Encounter (Signed)
She takes Seroquel XR.  She also takes some immediate release Seroquel that helps her sleep.  There are 100 mg tablets and 300 mg tablets on the med list but no 200 mg tablets.  Find out exactly which 1 she needs.

## 2022-03-08 NOTE — Telephone Encounter (Signed)
Please let me know what to send.300 mg sent previously but husband stated she takes an extra '100mg'$  ,half of 200 mg tab when needed

## 2022-03-09 ENCOUNTER — Other Ambulatory Visit: Payer: Self-pay

## 2022-03-09 DIAGNOSIS — F3164 Bipolar disorder, current episode mixed, severe, with psychotic features: Secondary | ICD-10-CM

## 2022-03-09 MED ORDER — QUETIAPINE FUMARATE 300 MG PO TABS: 300.0000 mg | ORAL_TABLET | Freq: Every day | ORAL | 0 refills | Status: DC

## 2022-03-09 MED ORDER — QUETIAPINE FUMARATE 100 MG PO TABS: 100.0000 mg | ORAL_TABLET | Freq: Every day | ORAL | 0 refills | Status: DC

## 2022-03-09 NOTE — Telephone Encounter (Signed)
She takes 400 mg and 300 mg in the evening and 1/2 off a 200 mg that she already HS PRN.I sent the 300 mg and 100 mg

## 2022-03-09 NOTE — Telephone Encounter (Signed)
Addendum: she had 200 mg already from an old rx that she was breaking in half

## 2022-04-28 ENCOUNTER — Ambulatory Visit (INDEPENDENT_AMBULATORY_CARE_PROVIDER_SITE_OTHER): Payer: Medicare HMO | Admitting: Psychiatry

## 2022-04-28 ENCOUNTER — Encounter: Payer: Self-pay | Admitting: Psychiatry

## 2022-04-28 DIAGNOSIS — F4001 Agoraphobia with panic disorder: Secondary | ICD-10-CM

## 2022-04-28 DIAGNOSIS — R7989 Other specified abnormal findings of blood chemistry: Secondary | ICD-10-CM | POA: Diagnosis not present

## 2022-04-28 DIAGNOSIS — F40240 Claustrophobia: Secondary | ICD-10-CM

## 2022-04-28 DIAGNOSIS — F5105 Insomnia due to other mental disorder: Secondary | ICD-10-CM | POA: Diagnosis not present

## 2022-04-28 DIAGNOSIS — F411 Generalized anxiety disorder: Secondary | ICD-10-CM | POA: Diagnosis not present

## 2022-04-28 DIAGNOSIS — F3164 Bipolar disorder, current episode mixed, severe, with psychotic features: Secondary | ICD-10-CM | POA: Diagnosis not present

## 2022-04-28 DIAGNOSIS — G3184 Mild cognitive impairment, so stated: Secondary | ICD-10-CM | POA: Diagnosis not present

## 2022-04-28 MED ORDER — QUETIAPINE FUMARATE 100 MG PO TABS: 100.0000 mg | ORAL_TABLET | Freq: Every day | ORAL | 1 refills | Status: DC

## 2022-04-28 MED ORDER — QUETIAPINE FUMARATE ER 400 MG PO TB24: ORAL_TABLET | ORAL | 1 refills | Status: DC

## 2022-04-28 MED ORDER — QUETIAPINE FUMARATE 300 MG PO TABS: ORAL_TABLET | ORAL | 0 refills | Status: DC

## 2022-04-28 MED ORDER — LAMOTRIGINE 150 MG PO TABS: 150.0000 mg | ORAL_TABLET | Freq: Two times a day (BID) | ORAL | 3 refills | Status: DC

## 2022-04-28 NOTE — Progress Notes (Signed)
[name] [id number] 25-Aug-1950 71 y.o.   Virtual Visit via Telephone Note  I connected with pt by telephone and verified that I am speaking with the correct person using two identifiers.   I discussed the limitations, risks, security and privacy concerns of performing an evaluation and management service by telephone and the availability of in person appointments. I also discussed with the patient that there may be a patient responsible charge related to this service. The patient expressed understanding and agreed to proceed.  I discussed the assessment and treatment plan with the patient. The patient was provided an opportunity to ask questions and all were answered. The patient agreed with the plan and demonstrated an understanding of the instructions.   The patient was advised to call back or seek an in-person evaluation if the symptoms worsen or if the condition fails to improve as anticipated.  I provided 30 minutes of non-face-to-face time during this encounter. . The patient was located at home and the provider was located office. Session from 230-300p  Subjective:   Patient ID:  [name] is a 71 y.o. (DOB 26-Jun-1951) female.  Chief Complaint:  Chief Complaint  Patient presents with   Follow-up    Bipolar affective disorder, mixed, severe, with psychotic behavior ([name])    Anxiety    Anxiety Symptoms include confusion, dizziness and nervous/anxious behavior. Patient reports no chest pain, decreased concentration, palpitations or suicidal ideas.        [name] presents to the office today for follow-up of anxiety and depression and poor cognition.  At her visit October 12, 2018.  Because of her poor cognition which did seem to get even worse lately with her memory and given a negative unremarkable medical work-up by her primary care doctor the decision was made to change her from Xanax which she has been on for years to lorazepam.  Lorazepam often has  less cognitive side effects than does alprazolam.  She had a lot of difficulty with headaches and insomnia with the transition.  She called several times in that transition.  Her husband reported that her cognition was better after the transition however.  She was satisfied with the Ativan.  There was an attempt to reduce her lamotrigine to 150 mg also in hopes of improving cognition but it was uncertain at the last visit where there she had actually done so.  visit December 2020 without med changes.  She had continued to do cognitively better off alprazolam and on lorazepam.  seen March 2021.   Better than December visit.  Feeling good and less down.  Adjusting time of med helped excessively sleep.  Brief hypomanic episode resolved where she cleaned all night.  Sleep 10 hours . No med changes.  12/23/19 appt.  Noted: More depression, crying and racing thoughts and distressed to the point of wondering if needed the hospital.  Sleep got worse and needed Seroquel IR 200 also bc went 2 days without sleep.  It worked and helped sleep.  Worry a lot and melancholy this week.  Cries over what happens dealing with her daugher.[name] has prostate and kidney cancer again.  Had surgery June 10, 2019 and she's cried a lot.  He's incontinent and very uncomfortable.  He usually has positive attitude.   Worry over his health and how she'll do if something happens to him.  She's afraid of Covid.  Trusting in God usually.  She realizes she  Needs to drive occasionally bc of  his health.  [name] had prostate CA and lived to 29 yo.  Uncle died of it.  Not markedly depressed. Can't walk much dT plantar fascitis. Also stressed over [name] 69 yo not doing school work and causing problems.  Her father [name] is a bad parent and not helpful.  Stressed over [name] and downs too and they have periodic conflict.   More down and anxious.   Guilt tendency.   Patient reports difficulty with sleep initiation or  maintenance in part DT anxiety and racing thoughts.Intermittent sleep problems with days and nights mixed up.  Not napping. Not manic.[name] Denies appetite disturbance.  Patient reports that energy and motivation have been good.  Patient has some difficulty with concentration.  Chronic memory problems.   Patient denies any suicidal ideation. Plan:   No changes in meds indicated except agree to take the extra Seroquel 200 IR HS for mixed manic sx that are worse right now.  May be having mixed seasonal sx.  02/20/2020 appointment with the following noted: Doing fairly good except chronic worry over [name].  Some days cries a lot over it.  D having a hard time lately.  Still easily stressed but not more than usual. Stopped extra quetiapine 200 mg HS bc no longer needed it. Anxiety is chronic but at baseline as is depression and not manic now. H helps her take the meds.   Memory is still bad but has been worse when on Xanax instead fo Ativan.  Sleep is fine from 10 to 9 which is much better than in the past.  Occ spells of staying up for 2 days and then needs to take quetiapine.   [name] goes for FU soon for cancer check up. Plan: no med changes  05/21/20 appt with following noted: "A lot of problems".  Can't drive bc fear and anxiety.  Needed to drive H and couldn't. Fall off toilet twice. Dizzy and HA 2 days ago with a lot of crying and stress with daughter. Leaving home even coming here causes anxiety.    Cataract surgery twice. Fear of Bill being diagnosed with recurrent cancer. Chronic anxiety greather than depression.   Don't know what to do with daughter. Sleep, appetite and energy are OK. Tolerating meds. Sees [name] one day weekly. H helps with meds bc she's forgetful and easily confused.  Loses train of thoughts. Dog has separation anxiety and so they don't go out much.  Plan: no med changes  08/18/20 appt with following noted: 2 ER visitis with vertigo.  RX diazepam and meclizine.  Rarely takes former but takes latter regularly. Shakes so bad it made her cry and scream.  Started PT. Continues lorazepam low dose 0.5 mg AM and 1.0 mg HS.  Still has dizziness and vertigo. Memory is still bad and H administers meds with pill box. Chronic anxiety.  Depression manageable. Sleep OK and tolerates meds otherwise. [name] driving her crazy. Plan: no med changes  11/17/20 appt with following noted: Has had periods of vertigo and meds for it.  Then had manic sx with cleaning and couldn't sleep for 24 hours. Stopped quetiapine 200 and continued quetiapine XR 800. Also started diazepam 5 mg BID for vertigo and continued lorazepam for anxiety.  No meclizine now.  Vertigo stopped and manic sx stopped.  Tolerating meds now. Still feels [name] is mean to her too often and she will cry over it. Mandy's ExH [name] in jail and is the father  of Mandy's D [name] 33 yo.  Will be there for 3 mos. Not markedly depressed.  But hates herself some days.  Still memory problems about the same.  Sleeping OK now.  Periods of mood swings. Easily anxious with relatives visiting.   Plan: Mixed manic sx resolved for now. No med changes today.  02/17/2021 appointment with the following noted: Not good.  "A whole lof of everything."  Shaking for mos. It comes and goes.  Shaking at times makes her think she'll have a nervous breakdown.   Went to ER and dx vertigo.  Has tried meclizine and Valium but scared to take it.  Big shakes again today.  Memory is not good. Sleep to escape anxiety.  Scared to leave home. Not sure what brings on the spells of shaking. Today sx hit her as soon as she awoke.  Plan: No med changes  03/12/2021 phone call: Patient with a lot of anxiety.  Husband reported the patient had been hallucinating and was paranoid that he had left her.  Some confusion over identity of people that were around her.  It was suggested that she have medical work-up because it sounded like she had delirium and  may have a UTI causing that. 03/17/2021 psychiatric hospitalization. 03/22/2021 MD response:  Note Patient has been very unstable with bipolar disorder mixed symptoms lately also with some delirium of unknown cause.  She will be seeing her primary care physician tomorrow.  Due to the severity of her symptoms it is suggested that she add haloperidol 2 mg nightly to her current quetiapine 800 mg nightly since we cannot increase the quetiapine dose further.  This may help with her confusion and agitation.  It is okay to talk with her husband because he administers her medication and because she is too confused to understand the instructions.     04/16/21 H called with following:  Pt.'s husband called.  She is currently in [facility] waiting for an inpatient pyschiatric bed.  Pt advised her husband that Dr Clovis Pu told her to stop taking the Lorazepam.  He said he is not aware of that chang in her prescription.  He would like someone to call him and advise if there were any changes to her meds so he stays aware and can let the hospital know if needed. Husband was informed we had not made any medication changes and would not have suggested she abruptly stopped lorazepam which could cause withdrawal and other kinds of problems.  05/11/2021 appointment with the following noted:  spoke with H for info At hospital was taken off Ativan, buspirone, and Seroquel reduced to 500 mg HS (as 1 of XR 400 mg  and 1/2 of 200 mg IR quetiapine).  Had sedative SE at 200 mg queiapinte !R.  Off risperidone and haloperidol.   Hospitalized at [facility] for 9 days.  They didn't feel communication was good with them. But she is better now than she was.  Had been psychotic PTH.  Last 2-3 days getting more hyper and can't wind down and hard time going to sleep.  Came home 05/03/21. She didn't like the restrictions at the hospital.  No panic lately and wants something to help her sleep. Plan; Mixed manic sx including  psychosis recently but not psychotic now but insomnia.. Will get more psychotic if she doesn't sleep but apparently didn't tolerate Seroquel IR 200 mg HS well so will increase to quetiapine IR 150 mg HS and XR 400 mg  nightly. Continue lamotrigine.    06/11/21 appt:  [place] [facility] thinks she's doing goood. Doing very fine.  No trouble sleeping.  Sometimes in morning feels sad bc she knows she will have racing thoughts, but it resolves after a little while.  Racing thoughts since hospital.  Worries over money chronically. To bed 1015-7:30.  Maybe longer which is normal. H administers meds.  Some wordfinding problems.  Does journal which helps. No SE with meds.  Yesterday was hard with anxiety but today is better.  On lamotrigine 150 BID, SERoquel XR 400 + Seroquel IR 150 mg HS. Plan: Much better but not fully resolved.  Mixed manic sx including psychosis recently but not psychotic but insomnia last visit resolved with the following... apparently didn't tolerate Seroquel IR 200 mg HS well so increased quetiapine IR 150 mg HS and XR 400 mg nightly and now she is sleeping but still having some racing thoughts. Continue lamotrigine 150 BID.    07/14/2021 appointment with the following noted: Bad day yesterday feeling very depressed without anxiety. Not sleeping well lately.  Thinks it's related to less Seroquel than in the past.  Some EMA.  Always normal slept a lot. Compulsive cleaning and everything in it's place.  Arranges soap dispensers. Cleaning in the middle of the night. No recent psychotic sx but still racing thoughts and chronic $ worries. Still afraid to leave home.  09/20/2021 appt noted: Great overall.  Occ insomnia with EMA.  Still worry but tries not let it consume me.  Still agoraphobia and hates to go out to dentists and doctors.  Challenge to go to dentist.  Can still dread things. Brief nap in daytimes. No SE H administers meds. Seroquel 300 mg HS, XR 400 mg HS.  Lamotrigine  150 Patient reports stable mood and denies depressed or irritable moods.   Patient denies difficulty with sleep initiation or maintenance. Denies appetite disturbance.  Patient reports that energy and motivation have been good.  Patient denies any difficulty with concentration.  Patient denies any suicidal ideation.  01/20/22 appt noted: also talked with [name] Doesn't like waiting 4 mos between appts. Was manic before increasing Seroquel with irritability, not sleeping a couple of days and excessive cleaning. Seroquel increased to 400 mg HS.  No SE I feel much Better now.  Doing better at time with anxiety.   Chronic worry over D and GD. GD with psych problems. Sleeping good again. Racing thoughts are better but not gone Tolerating meds.   No further changes desired.   Plan: Seroquel IR 400 mg HS well and XR 400 mg nightly abc recent manic sx better with increase dose. Continue lamotrigine 150 mg twice daily  04/28/2022 appointment noted: Trouble sleeping without extra 100 mg quetiapine HS and takes XR 400 mg and IR 300 mg at 730 pm.  Otherwise will be up until 3 AM.  Sleep from 11 until 830.  Good unless anxious. Doing fairly well except 2 weeks bad spell from July 31 related to [place] receiving call from woman he worked with 8 years ago and triggered fear ans suspiciousness and insecurity and overwhelmed her.  She cried and over reacted.  Rough 2 weeks but otherwise ok. Real worried about [name] who is self cutting.  Seeing therapist and psychiatrist.  Her father is terrible and inattentive.  She is 71 yo and never happy. I still have agoraphobia and doesn't want to go ut places.  Her dog also has severe anxiety and doesn't like to  be alone too.  Still fearul about money.   Prior psychiatric medication trials include paroxetine, Lexapro,  risperidone 4 mg which was sedating, aripiprazole,  perphenazine,  Seroquel 1000, olanzapine for anxiety, and  lithium which was not tolerated even at very low  dosages.  Topamax for anxiety, She was intolerant of both Namenda and Aricept.   Lamotrigine 300.   Xanax, Ativan, diazepam 5 mg BID   This is not an exhaustive list.   Psych hospitalization 04/2021 at [facility] for psychosis.  At the visit in February 2020 [name] was more acutely confused recently for no clear reason.   She had a negative medical work-up for causes of short-term memory problems and confusion.  Therefore the decision was made to try switching her from alprazolam to lorazepam at the lowest possible dose in hopes of improving her cognition.  This was successful and her cognition was improved significantly and her husband verified this.     Review of Systems:  Review of Systems  Eyes:  Positive for visual disturbance.  Cardiovascular:  Negative for chest pain and palpitations.  Musculoskeletal:  Positive for arthralgias, back pain and gait problem.  Neurological:  Positive for dizziness. Negative for weakness.  Psychiatric/Behavioral:  Positive for confusion. Negative for agitation, behavioral problems, decreased concentration, dysphoric mood, hallucinations, self-injury, sleep disturbance and suicidal ideas. The patient is nervous/anxious. The patient is not hyperactive.     Medications: I have reviewed the patient's current medications.  Current Outpatient Medications  Medication Sig Dispense Refill   acetaminophen (TYLENOL) 325 MG tablet Take 650 mg by mouth every 6 (six) hours as needed for mild pain or headache.     amLODipine (NORVASC) 5 MG tablet Take 5 mg by mouth every evening.     aspirin 81 MG tablet Take 81 mg by mouth daily.     cephALEXin (KEFLEX) 250 MG capsule Take 250 mg by mouth at bedtime.     diclofenac sodium (VOLTAREN) 1 % GEL Apply 2 g topically 4 (four) times daily. Rub into affected area of foot 2 to 4 times daily 100 g 2   esomeprazole (NEXIUM) 40 MG capsule Take 1 capsule (40 mg total) by mouth daily. 30 capsule 0   famotidine (PEPCID) 20  MG tablet Take 20 mg by mouth in the morning and at bedtime.     furosemide (LASIX) 20 MG tablet Take 20 mg by mouth daily as needed (for severe swelling).     glimepiride (AMARYL) 1 MG tablet Take 1 mg by mouth daily.     levocetirizine (XYZAL) 5 MG tablet Take 5 mg by mouth every evening.     levothyroxine (SYNTHROID) 100 MCG tablet Take 100 mcg by mouth daily before breakfast.     lidocaine (LIDODERM) 5 % Place 1 patch onto the skin daily. Remove & Discard patch within 12 hours or as directed by MD 6 patch 0   lisinopril (PRINIVIL,ZESTRIL) 20 MG tablet Take 20 mg by mouth daily.     meloxicam (MOBIC) 15 MG tablet Take 15 mg by mouth daily.     metoprolol succinate (TOPROL-XL) 50 MG 24 hr tablet Take 50 mg by mouth every evening. Take with or immediately following a meal.     Multiple Vitamin (MULTIVITAMIN) tablet Take 1 tablet by mouth daily.     polyethylene glycol (MIRALAX / GLYCOLAX) packet Take 17 g by mouth 2 (two) times daily as needed for mild constipation (MIX AS DIRECTED AND DRINK).     potassium chloride  SA (K-DUR,KLOR-CON) 20 MEQ tablet Take 20 mEq by mouth daily.     simvastatin (ZOCOR) 40 MG tablet Take 1 tablet (40 mg total) by mouth every evening. 30 tablet 0   lamoTRIgine (LAMICTAL) 150 MG tablet Take 1 tablet (150 mg total) by mouth 2 (two) times daily. 180 tablet 3   QUEtiapine (SEROQUEL XR) 400 MG 24 hr tablet 1 at 730PM 90 tablet 1   QUEtiapine (SEROQUEL) 100 MG tablet Take 1 tablet (100 mg total) by mouth at bedtime. 90 tablet 1   QUEtiapine (SEROQUEL) 300 MG tablet 1 tablet at 730 PM 90 tablet 0   No current facility-administered medications for this visit.    Medication Side Effects: None  Allergies:  Allergies  Allergen Reactions   Maxzide [Triamterene-Hctz] Other (See Comments)    Hurt patient's kidneys   Celexa [Citalopram Hydrobromide] Nausea Only   Metformin And Related Nausea And Vomiting   Other Nausea And Vomiting and Other (See Comments)    Unnamed  medication hurt the patient's kidneys   Reglan [Metoclopramide] Nausea Only   Risperdal [Risperidone] Nausea Only and Other (See Comments)    Also caused hallucinations   Trazodone And Nefazodone Nausea Only   Zestril [Lisinopril] Nausea Only   Lithium Palpitations and Other (See Comments)    Shakes and delusional pt started seeing things      Past Medical History:  Diagnosis Date   Anxiety    Arthritis    Asthma    Bipolar 1 disorder (HCC)    CHF (congestive heart failure) (HCC)    Complication of anesthesia    left a bad taste in my mouth it has lasted since january    Depression    Diabetes mellitus    Hernia    umbilical   Hyperlipidemia    Hypertension    Hypothyroidism    Pericarditis, viral 2010 October   Sleep apnea    uses cpap setting of 15    Family History  Problem Relation Age of Onset   Diabetes Mother    Hypertension Mother    Hyperlipidemia Mother    Diabetes Father    Hypertension Father    Hyperlipidemia Father    Hypertension Sister     Social History   Socioeconomic History   Marital status: Married    Spouse name: Not on file   Number of children: Not on file   Years of education: Not on file   Highest education level: Not on file  Occupational History   Not on file  Tobacco Use   Smoking status: Never   Smokeless tobacco: Never  Substance and Sexual Activity   Alcohol use: No   Drug use: No   Sexual activity: Not on file  Other Topics Concern   Not on file  Social History Narrative   Not on file   Social Determinants of Health   Financial Resource Strain: Not on file  Food Insecurity: Not on file  Transportation Needs: Not on file  Physical Activity: Not on file  Stress: Not on file  Social Connections: Not on file  Intimate Partner Violence: Not on file    Past Medical History, Surgical history, Social history, and Family history were reviewed and updated as appropriate.   Please see review of systems for further  details on the patient's review from today.   Objective:   Physical Exam:  There were no vitals taken for this visit.  Physical Exam Neurological:     Mental Status:  She is alert and oriented to person, place, and time.     Cranial Nerves: No dysarthria.  Psychiatric:        Attention and Perception: Attention and perception normal.        Mood and Affect: Mood is anxious. Mood is not depressed. Affect is tearful.        Speech: Speech normal. Speech is not rapid and pressured.        Behavior: Behavior is cooperative.        Thought Content: Thought content normal. Thought content is not paranoid or delusional. Thought content does not include homicidal or suicidal ideation. Thought content does not include suicidal plan.        Cognition and Memory: Cognition and memory normal.        Judgment: Judgment normal.     Comments: Insight intact   Talkative chronically per usual.  Lab Review:     Component Value Date/Time   NA 143 04/15/2021 1831   K 4.2 04/15/2021 1831   CL 106 04/15/2021 1831   CO2 27 04/15/2021 1831   GLUCOSE 162 (H) 04/15/2021 1831   GLUCOSE 154 (H) 09/01/2006 1100   BUN 15 04/15/2021 1831   CREATININE 0.91 04/15/2021 1831   CREATININE 0.93 07/19/2011 1110   CALCIUM 11.1 (H) 04/15/2021 1831   PROT 7.5 04/15/2021 1831   ALBUMIN 4.2 04/15/2021 1831   AST 26 04/15/2021 1831   ALT 21 04/15/2021 1831   ALKPHOS 104 04/15/2021 1831   BILITOT 0.7 04/15/2021 1831   GFRNONAA >60 04/15/2021 1831   GFRAA >60 03/23/2016 1933       Component Value Date/Time   WBC 5.7 04/15/2021 1831   RBC 4.37 04/15/2021 1831   HGB 14.1 04/15/2021 1831   HCT 41.0 04/15/2021 1831   PLT 221 04/15/2021 1831   MCV 93.8 04/15/2021 1831   MCH 32.3 04/15/2021 1831   MCHC 34.4 04/15/2021 1831   RDW 13.7 04/15/2021 1831   LYMPHSABS 1.2 04/15/2021 1831   MONOABS 0.4 04/15/2021 1831   EOSABS 0.1 04/15/2021 1831   BASOSABS 0.1 04/15/2021 1831   Prior lamotrigine level in 2018 on  this dosage was 3.6.  Not particularly high.  Per Dr. Marcos Eke: Clinical Impressions: Cognitive complaints are likely due to underlying psychiatric disorder (bipolar disorder/anxiety disorder with racing thoughts). Diagnostic impressions based on test performances are limited due to the patient's severe inability to fully attend to the tasks. However, based on clinical presentation, I highly doubt the presence of a neurodegenerative dementia. It is much more likely that her racing thoughts and psychiatric disorders are interfering with cognitive   No results found for: "POCLITH", "LITHIUM"   No results found for: "PHENYTOIN", "PHENOBARB", "VALPROATE", "CBMZ"   .res Assessment: Plan:    Bipolar affective disorder, mixed, severe, with psychotic behavior ([place]) - Plan: QUEtiapine (SEROQUEL XR) 400 MG 24 hr tablet, QUEtiapine (SEROQUEL) 100 MG tablet, QUEtiapine (SEROQUEL) 300 MG tablet, lamoTRIgine (LAMICTAL) 150 MG tablet  Panic disorder with agoraphobia  Generalized anxiety disorder  Mild cognitive impairment  Claustrophobia  Insomnia due to mental condition - Plan: QUEtiapine (SEROQUEL) 100 MG tablet  Low vitamin D level   Thayer Headings has chronic severe anxiety with panic attacks as well as bipolar disorder with a history of significant lability.  She has had more severe instability with psychotic sx and cognitive problems off and on lately.  She remains chronically anxious and that has been worse lately.   Psych hospitalization 04/2021 at [facility] for  psychosis. She also has mild cognitive impairment as a complicating factor.  She needs her husband's assistance to manage her medications.    Much better mania and anxiety managebale but never gone.  Better with  increase Seroquel IR 400 mg HS well and XR 400 mg nightly abc recent manic sx better with increase dose. Trouble sleeping without extra 100 mg quetiapine HS and takes XR 400 mg and IR 300 mg at 730 pm.    Continue lamotrigine  150 BID.    Consider switch to [place] bc lower SE and may help anxiety but probably would not be covered by insurance.  Discussed potential metabolic side effects associated with atypical antipsychotics, as well as potential risk for movement side effects. Advised pt to contact office if movement side effects occur.  Disc family's fear of LT SE.  However current meds work and without them sx unmanageable.  Has been able to stop BZ and been without them since 04/26/21. Last filled lorazepam 03/2021  Pt is chronically needy and requires extended appts DT chronic anxiety and easily stressed.  Supportive and cognitive work are done with the patient on her excessive guilt and now stress of vertigo.  Cannot drive now and wasn't much before.  Also addressing her fears related to her husband's diagnosis of cancer. Supportive therapy dealing with chronic stressors and difficulty with Gdaughter's mental illness..   Pt doesn't feel able to fix her meds by herself. Disc her fears about going out and other phobias and disc behaviour therapy for I. Fight this behaviorally.  No med changes this visit.  Patient is off benzodiazepine and just taking the 2 versions of quetiapine plus lamotrigine  FU 3 mos   [name], MD, DFAPA   Please see After Visit Summary for patient specific instructions.  No future appointments.    No orders of the defined types were placed in this encounter.      -------------------------------

## 2022-06-02 DIAGNOSIS — Z23 Encounter for immunization: Secondary | ICD-10-CM | POA: Diagnosis not present

## 2022-06-06 ENCOUNTER — Other Ambulatory Visit: Payer: Self-pay | Admitting: Psychiatry

## 2022-06-06 DIAGNOSIS — F3164 Bipolar disorder, current episode mixed, severe, with psychotic features: Secondary | ICD-10-CM

## 2022-06-16 ENCOUNTER — Other Ambulatory Visit: Payer: Self-pay | Admitting: Psychiatry

## 2022-06-16 DIAGNOSIS — F3164 Bipolar disorder, current episode mixed, severe, with psychotic features: Secondary | ICD-10-CM

## 2022-08-03 ENCOUNTER — Ambulatory Visit (INDEPENDENT_AMBULATORY_CARE_PROVIDER_SITE_OTHER): Payer: Medicare HMO | Admitting: Psychiatry

## 2022-08-03 ENCOUNTER — Encounter: Payer: Self-pay | Admitting: Psychiatry

## 2022-08-03 DIAGNOSIS — F5105 Insomnia due to other mental disorder: Secondary | ICD-10-CM | POA: Diagnosis not present

## 2022-08-03 DIAGNOSIS — R7989 Other specified abnormal findings of blood chemistry: Secondary | ICD-10-CM | POA: Diagnosis not present

## 2022-08-03 DIAGNOSIS — F4001 Agoraphobia with panic disorder: Secondary | ICD-10-CM | POA: Diagnosis not present

## 2022-08-03 DIAGNOSIS — G3184 Mild cognitive impairment, so stated: Secondary | ICD-10-CM | POA: Diagnosis not present

## 2022-08-03 DIAGNOSIS — F40240 Claustrophobia: Secondary | ICD-10-CM | POA: Diagnosis not present

## 2022-08-03 DIAGNOSIS — F3164 Bipolar disorder, current episode mixed, severe, with psychotic features: Secondary | ICD-10-CM

## 2022-08-03 DIAGNOSIS — F411 Generalized anxiety disorder: Secondary | ICD-10-CM | POA: Diagnosis not present

## 2022-08-03 MED ORDER — OXCARBAZEPINE 300 MG PO TABS: ORAL_TABLET | ORAL | 0 refills | Status: DC

## 2022-08-03 NOTE — Progress Notes (Signed)
[name] [id number] 09-Jan-1951 71 y.o.   Virtual Visit via Telephone Note  I connected with pt by telephone and verified that I am speaking with the correct person using two identifiers.   I discussed the limitations, risks, security and privacy concerns of performing an evaluation and management service by telephone and the availability of in person appointments. I also discussed with the patient that there may be a patient responsible charge related to this service. The patient expressed understanding and agreed to proceed.  I discussed the assessment and treatment plan with the patient. The patient was provided an opportunity to ask questions and all were answered. The patient agreed with the plan and demonstrated an understanding of the instructions.   The patient was advised to call back or seek an in-person evaluation if the symptoms worsen or if the condition fails to improve as anticipated.  I provided 30 minutes of non-face-to-face time during this encounter. . The patient was located at home and the provider was located office. Session from 230-300p  Subjective:   Patient ID:  [name] is a 71 y.o. (DOB Nov 19, 1950) female.  Chief Complaint:  Chief Complaint  Patient presents with   Follow-up    Bipolar affective disorder, mixed, severe, with psychotic behavior ([place])   Anxiety   Depression    Anxiety Symptoms include confusion, dizziness and nervous/anxious behavior. Patient reports no chest pain, decreased concentration, palpitations or suicidal ideas.        [name] presents to the office today for follow-up of anxiety and depression and poor cognition.  At her visit October 12, 2018.  Because of her poor cognition which did seem to get even worse lately with her memory and given a negative unremarkable medical work-up by her primary care doctor the decision was made to change her from Xanax which she has been on for years to lorazepam.  Lorazepam  often has less cognitive side effects than does alprazolam.  She had a lot of difficulty with headaches and insomnia with the transition.  She called several times in that transition.  Her husband reported that her cognition was better after the transition however.  She was satisfied with the Ativan.  There was an attempt to reduce her lamotrigine to 150 mg also in hopes of improving cognition but it was uncertain at the last visit where there she had actually done so.  visit December 2020 without med changes.  She had continued to do cognitively better off alprazolam and on lorazepam.  seen March 2021.   Better than December visit.  Feeling good and less down.  Adjusting time of med helped excessively sleep.  Brief hypomanic episode resolved where she cleaned all night.  Sleep 10 hours . No med changes.  12/23/19 appt.  Noted: More depression, crying and racing thoughts and distressed to the point of wondering if needed the hospital.  Sleep got worse and needed Seroquel IR 200 also bc went 2 days without sleep.  It worked and helped sleep.  Worry a lot and melancholy this week.  Cries over what happens dealing with her daugher.[name] has prostate and kidney cancer again.  Had surgery June 10, 2019 and she's cried a lot.  He's incontinent and very uncomfortable.  He usually has positive attitude.   Worry over his health and how she'll do if something happens to him.  She's afraid of Covid.  Trusting in God usually.  She realizes she  Needs to drive occasionally  bc of his health.  [name]'s father had prostate CA and lived to 92 yo.  Uncle died of it.  Not markedly depressed. Can't walk much dT plantar fascitis. Also stressed over [name] 69 yo not doing school work and causing problems.  Her father [name] is a bad parent and not helpful.  Stressed over [name] and downs too and they have periodic conflict.   More down and anxious.   Guilt tendency.   Patient reports difficulty with sleep initiation or  maintenance in part DT anxiety and racing thoughts.Intermittent sleep problems with days and nights mixed up.  Not napping. Not manic.[name] Denies appetite disturbance.  Patient reports that energy and motivation have been good.  Patient has some difficulty with concentration.  Chronic memory problems.   Patient denies any suicidal ideation. Plan:   No changes in meds indicated except agree to take the extra Seroquel 200 IR HS for mixed manic sx that are worse right now.  May be having mixed seasonal sx.  02/20/2020 appointment with the following noted: Doing fairly good except chronic worry over [name] and Yolanda [name].  Some days cries a lot over it.  [name] having a hard time lately.  Still easily stressed but not more than usual. Stopped extra quetiapine 200 mg HS bc no longer needed it. Anxiety is chronic but at baseline as is depression and not manic now. H helps her take the meds.   Memory is still bad but has been worse when on Xanax instead fo Ativan.  Sleep is fine from 10 to 9 which is much better than in the past.  Occ spells of staying up for 2 days and then needs to take quetiapine.   H [name] goes for FU soon for cancer check up. Plan: no med changes  05/21/20 appt with following noted: "A lot of problems".  Can't drive bc fear and anxiety.  Needed to drive H and couldn't. Fall off toilet twice. [name] 2 days ago with a lot of crying and stress with daughter. Leaving home even coming here causes anxiety.    Cataract surgery twice. Fear of [name] being diagnosed with recurrent cancer. Chronic anxiety greather than depression.   Don't know what to do with daughter. Sleep, appetite and energy are OK. Tolerating meds. Sees [name] one day weekly. H helps with meds bc she's forgetful and easily confused.  Loses train of thoughts. Dog has separation anxiety and so they don't go out much.  Plan: no med changes  08/18/20 appt with following noted: 2 ER visitis with vertigo.  RX diazepam and meclizine.  Rarely takes former but takes latter regularly. Shakes so bad it made her cry and scream.  Started PT. Continues lorazepam low dose 0.5 mg AM and 1.0 mg HS.  Still has dizziness and vertigo. Memory is still bad and H administers meds with pill box. Chronic anxiety.  Depression manageable. Sleep OK and tolerates meds otherwise. [name] driving her crazy. Plan: no med changes  11/17/20 appt with following noted: Has had periods of vertigo and meds for it.  Then had manic sx with cleaning and couldn't sleep for 24 hours. Stopped quetiapine 200 and continued quetiapine XR 800. Also started diazepam 5 mg BID for vertigo and continued lorazepam for anxiety.  No meclizine now.  Vertigo stopped and manic sx stopped.  Tolerating meds now. Still feels [name] is mean to her too often and she will cry over it. Mandy's ExH [name] in jail and is  the father of Mandy's [name] [name] 69 yo.  Will be there for 3 mos. Not markedly depressed.  But hates herself some days.  Still memory problems about the same.  Sleeping OK now.  Periods of mood swings. Easily anxious with relatives visiting.   Plan: Mixed manic sx resolved for now. No med changes today.  02/17/2021 appointment with the following noted: Not good.  "A whole lof of everything."  Shaking for mos. It comes and goes.  Shaking at times makes her think she'll have a nervous breakdown.   Went to ER and dx vertigo.  Has tried meclizine and Valium but scared to take it.  Big shakes again today.  Memory is not good. Sleep to escape anxiety.  Scared to leave home. Not sure what brings on the spells of shaking. Today sx hit her as soon as she awoke.  Plan: No med changes  03/12/2021 phone call: Patient with a lot of anxiety.  Husband reported the patient had been hallucinating and was paranoid that he had left her.  Some confusion over identity of people that were around her.  It was suggested that she have medical work-up because it sounded like she had delirium and  may have a UTI causing that. 03/17/2021 psychiatric hospitalization. 03/22/2021 MD response:  Note Patient has been very unstable with bipolar disorder mixed symptoms lately also with some delirium of unknown cause.  She will be seeing her primary care physician tomorrow.  Due to the severity of her symptoms it is suggested that she add haloperidol 2 mg nightly to her current quetiapine 800 mg nightly since we cannot increase the quetiapine dose further.  This may help with her confusion and agitation.  It is okay to talk with her husband because he administers her medication and because she is too confused to understand the instructions.     04/16/21 H called with following:  Pt.'s husband called.  She is currently in [place] [facility] waiting for an inpatient pyschiatric bed.  Pt advised her husband that Dr Clovis Pu told her to stop taking the Lorazepam.  He said he is not aware of that chang in her prescription.  He would like someone to call him and advise if there were any changes to her meds so he stays aware and can let the hospital know if needed. Husband was informed we had not made any medication changes and would not have suggested she abruptly stopped lorazepam which could cause withdrawal and other kinds of problems.  05/11/2021 appointment with the following noted:  spoke with H for info At hospital was taken off Ativan, buspirone, and Seroquel reduced to 500 mg HS (as 1 of XR 400 mg  and 1/2 of 200 mg IR quetiapine).  Had sedative SE at 200 mg queiapinte !R.  Off risperidone and haloperidol.   Hospitalized at [facility] for 9 days.  They didn't feel communication was good with them. But she is better now than she was.  Had been psychotic PTH.  Last 2-3 days getting more hyper and can't wind down and hard time going to sleep.  Came home 05/03/21. She didn't like the restrictions at the hospital.  No panic lately and wants something to help her sleep. Plan; Mixed manic sx including  psychosis recently but not psychotic now but insomnia.. Will get more psychotic if she doesn't sleep but apparently didn't tolerate Seroquel IR 200 mg HS well so will increase to quetiapine IR 150 mg HS and XR  400 mg nightly. Continue lamotrigine.    06/11/21 appt:  [place] H thinks she's doing goood. Doing very fine.  No trouble sleeping.  Sometimes in morning feels sad bc she knows she will have racing thoughts, but it resolves after a little while.  Racing thoughts since hospital.  Worries over money chronically. To bed 1015-7:30.  Maybe longer which is normal. H administers meds.  Some wordfinding problems.  Does journal which helps. No SE with meds.  Yesterday was hard with anxiety but today is better.  On lamotrigine 150 BID, SERoquel XR 400 + Seroquel IR 150 mg HS. Plan: Much better but not fully resolved.  Mixed manic sx including psychosis recently but not psychotic but insomnia last visit resolved with the following... apparently didn't tolerate Seroquel IR 200 mg HS well so increased quetiapine IR 150 mg HS and XR 400 mg nightly and now she is sleeping but still having some racing thoughts. Continue lamotrigine 150 BID.    07/14/2021 appointment with the following noted: Bad day yesterday feeling very depressed without anxiety. Not sleeping well lately.  Thinks it's related to less Seroquel than in the past.  Some EMA.  Always normal slept a lot. Compulsive cleaning and everything in it's place.  Arranges soap dispensers. Cleaning in the middle of the night. No recent psychotic sx but still racing thoughts and chronic $ worries. Still afraid to leave home.  09/20/2021 appt noted: Great overall.  Occ insomnia with EMA.  Still worry but tries not let it consume me.  Still agoraphobia and hates to go out to dentists and doctors.  Challenge to go to dentist.  Can still dread things. Brief nap in daytimes. No SE H administers meds. Seroquel 300 mg HS, XR 400 mg HS.  Lamotrigine  150 Patient reports stable mood and denies depressed or irritable moods.   Patient denies difficulty with sleep initiation or maintenance. Denies appetite disturbance.  Patient reports that energy and motivation have been good.  Patient denies any difficulty with concentration.  Patient denies any suicidal ideation.  01/20/22 appt noted: also talked with [name] Doesn't like waiting 4 mos between appts. Was manic before increasing Seroquel with irritability, not sleeping a couple of days and excessive cleaning. Seroquel increased to IR 400 mg HS.  No SE I feel much Better now.  Doing better at time with anxiety.   Chronic worry over [name] and Yolanda. Yolanda with psych problems. Sleeping good again. Racing thoughts are better but not gone Tolerating meds.   No further changes desired.   Plan: Seroquel IR 400 mg HS well and XR 400 mg nightly abc recent manic sx better with increase dose. Continue lamotrigine 150 mg twice daily  04/28/2022 appointment noted: Trouble sleeping without extra 100 mg quetiapine HS and takes XR 400 mg and IR 300 mg at 730 pm.  Otherwise will be up until 3 AM.  Sleep from 11 until 830.  Good unless anxious. Doing fairly well except 2 weeks bad spell from July 31 related to [place] receiving call from woman he worked with 8 years ago and triggered fear ans suspiciousness and insecurity and overwhelmed her.  She cried and over reacted.  Rough 2 weeks but otherwise ok. Real worried about [name] who is self cutting.  Seeing therapist and psychiatrist.  Her father is terrible and inattentive.  She is 69 yo and never happy. I still have agoraphobia and doesn't want to go ut places.  Her dog also has severe anxiety and  doesn't like to be alone too.  Still fearul about money.  08/03/22 appt noted: Doing pretty well overall.  Chronic worry over money.  Met old friend on Facebook.  She would contact Marcie Bal a lot.  Was making her sad.  Would cry every time the woman called.  ,This was wearing her out bc  woman was hyperverbal.  Cut the woman off.  Since stopped talking to her then no longer crying. When starts to get dark then racing thoughts.  Driving me crazy.  Chronic worry .  Chronic memorhy problems and tendency to be obsessed over fear of dementia. Irritable and confused.  Might nap 30 min and rest her mind and awakens feeling better. Continues the same meds: lamotrigine and quetiapine. Gained 20# last year.   Prior psychiatric medication trials include paroxetine, Lexapro,  risperidone 4 mg which was sedating, aripiprazole,  perphenazine,  Seroquel 1000, olanzapine for anxiety, and  lithium which was not tolerated even at very low dosages.  Topamax for anxiety, She was intolerant of both Namenda and Aricept.   Lamotrigine 300.   Xanax, Ativan, diazepam 5 mg BID   This is not an exhaustive list.   Psych hospitalization 04/2021 at [facility] for psychosis.  At the visit in February 2020 [place] was more acutely confused recently for no clear reason.   She had a negative medical work-up for causes of short-term memory problems and confusion.  Therefore the decision was made to try switching her from alprazolam to lorazepam at the lowest possible dose in hopes of improving her cognition.  This was successful and her cognition was improved significantly and her husband verified this.     Review of Systems:  Review of Systems  Eyes:  Positive for visual disturbance.  Cardiovascular:  Negative for chest pain and palpitations.  Musculoskeletal:  Positive for arthralgias, back pain and gait problem.  Neurological:  Positive for dizziness. Negative for weakness.  Psychiatric/Behavioral:  Positive for confusion and sleep disturbance. Negative for agitation, behavioral problems, decreased concentration, dysphoric mood, hallucinations, self-injury and suicidal ideas. The patient is nervous/anxious. The patient is not hyperactive.     Medications: I have reviewed the patient's current  medications.  Current Outpatient Medications  Medication Sig Dispense Refill   acetaminophen (TYLENOL) 325 MG tablet Take 650 mg by mouth every 6 (six) hours as needed for mild pain or headache.     amLODipine (NORVASC) 5 MG tablet Take 5 mg by mouth every evening.     aspirin 81 MG tablet Take 81 mg by mouth daily.     cephALEXin (KEFLEX) 250 MG capsule Take 250 mg by mouth at bedtime.     diclofenac sodium (VOLTAREN) 1 % GEL Apply 2 g topically 4 (four) times daily. Rub into affected area of foot 2 to 4 times daily 100 g 2   esomeprazole (NEXIUM) 40 MG capsule Take 1 capsule (40 mg total) by mouth daily. 30 capsule 0   famotidine (PEPCID) 20 MG tablet Take 20 mg by mouth in the morning and at bedtime.     furosemide (LASIX) 20 MG tablet Take 20 mg by mouth daily as needed (for severe swelling).     glimepiride (AMARYL) 1 MG tablet Take 1 mg by mouth daily.     lamoTRIgine (LAMICTAL) 150 MG tablet TAKE 1 TABLET TWICE DAILY 180 tablet 0   levocetirizine (XYZAL) 5 MG tablet Take 5 mg by mouth every evening.     levothyroxine (SYNTHROID) 100 MCG tablet Take 100  mcg by mouth daily before breakfast.     lidocaine (LIDODERM) 5 % Place 1 patch onto the skin daily. Remove & Discard patch within 12 hours or as directed by MD 6 patch 0   lisinopril (PRINIVIL,ZESTRIL) 20 MG tablet Take 20 mg by mouth daily.     meloxicam (MOBIC) 15 MG tablet Take 15 mg by mouth daily.     metoprolol succinate (TOPROL-XL) 50 MG 24 hr tablet Take 50 mg by mouth every evening. Take with or immediately following a meal.     Multiple Vitamin (MULTIVITAMIN) tablet Take 1 tablet by mouth daily.     Oxcarbazepine (TRILEPTAL) 300 MG tablet 1/2 tablet twice daily for 4 days then 1/2 tablet in the AM and 1 tablet at night 45 tablet 0   polyethylene glycol (MIRALAX / GLYCOLAX) packet Take 17 g by mouth 2 (two) times daily as needed for mild constipation (MIX AS DIRECTED AND DRINK).     potassium chloride SA (K-DUR,KLOR-CON) 20 MEQ  tablet Take 20 mEq by mouth daily.     QUEtiapine (SEROQUEL XR) 400 MG 24 hr tablet 1 at 730PM 90 tablet 1   QUEtiapine (SEROQUEL) 100 MG tablet Take 1 tablet (100 mg total) by mouth at bedtime. 90 tablet 1   QUEtiapine (SEROQUEL) 300 MG tablet TAKE 1 TABLET AT BEDTIME 90 tablet 10   simvastatin (ZOCOR) 40 MG tablet Take 1 tablet (40 mg total) by mouth every evening. 30 tablet 0   No current facility-administered medications for this visit.    Medication Side Effects: None  Allergies:  Allergies  Allergen Reactions   Maxzide [Triamterene-Hctz] Other (See Comments)    Hurt patient's kidneys   Celexa [Citalopram Hydrobromide] Nausea Only   Metformin And Related Nausea And Vomiting   Other Nausea And Vomiting and Other (See Comments)    Unnamed medication hurt the patient's kidneys   Reglan [Metoclopramide] Nausea Only   Risperdal [Risperidone] Nausea Only and Other (See Comments)    Also caused hallucinations   Trazodone And Nefazodone Nausea Only   Zestril [Lisinopril] Nausea Only   Lithium Palpitations and Other (See Comments)    Shakes and delusional pt started seeing things      Past Medical History:  Diagnosis Date   Anxiety    Arthritis    Asthma    Bipolar 1 disorder (HCC)    CHF (congestive heart failure) (HCC)    Complication of anesthesia    left a bad taste in my mouth it has lasted since january    Depression    Diabetes mellitus    Hernia    umbilical   Hyperlipidemia    Hypertension    Hypothyroidism    Pericarditis, viral 2010 October   Sleep apnea    uses cpap setting of 15    Family History  Problem Relation Age of Onset   Diabetes Mother    Hypertension Mother    Hyperlipidemia Mother    Diabetes Father    Hypertension Father    Hyperlipidemia Father    Hypertension Sister     Social History   Socioeconomic History   Marital status: Married    Spouse name: Not on file   Number of children: Not on file   Years of education: Not on  file   Highest education level: Not on file  Occupational History   Not on file  Tobacco Use   Smoking status: Never   Smokeless tobacco: Never  Substance and Sexual Activity  Alcohol use: No   Drug use: No   Sexual activity: Not on file  Other Topics Concern   Not on file  Social History Narrative   Not on file   Social Determinants of Health   Financial Resource Strain: Not on file  Food Insecurity: Not on file  Transportation Needs: Not on file  Physical Activity: Not on file  Stress: Not on file  Social Connections: Not on file  Intimate Partner Violence: Not on file    Past Medical History, Surgical history, Social history, and Family history were reviewed and updated as appropriate.   Please see review of systems for further details on the patient's review from today.   Objective:   Physical Exam:  There were no vitals taken for this visit.  Physical Exam Neurological:     Mental Status: She is alert and oriented to person, place, and time.     Cranial Nerves: No dysarthria.  Psychiatric:        Attention and Perception: Attention and perception normal.        Mood and Affect: Mood is anxious. Mood is not depressed. Affect is labile and tearful.        Speech: Speech is rapid and pressured.        Behavior: Behavior is cooperative.        Thought Content: Thought content normal. Thought content is not paranoid or delusional. Thought content does not include homicidal or suicidal ideation. Thought content does not include suicidal plan.        Cognition and Memory: Cognition and memory normal.        Judgment: Judgment normal.     Comments: Insight intact   Talkative chronically but worse.  Lab Review:     Component Value Date/Time   NA 143 04/15/2021 1831   K 4.2 04/15/2021 1831   CL 106 04/15/2021 1831   CO2 27 04/15/2021 1831   GLUCOSE 162 (H) 04/15/2021 1831   GLUCOSE 154 (H) 09/01/2006 1100   BUN 15 04/15/2021 1831   CREATININE 0.91 04/15/2021  1831   CREATININE 0.93 07/19/2011 1110   CALCIUM 11.1 (H) 04/15/2021 1831   PROT 7.5 04/15/2021 1831   ALBUMIN 4.2 04/15/2021 1831   AST 26 04/15/2021 1831   ALT 21 04/15/2021 1831   ALKPHOS 104 04/15/2021 1831   BILITOT 0.7 04/15/2021 1831   GFRNONAA >60 04/15/2021 1831   GFRAA >60 03/23/2016 1933       Component Value Date/Time   WBC 5.7 04/15/2021 1831   RBC 4.37 04/15/2021 1831   HGB 14.1 04/15/2021 1831   HCT 41.0 04/15/2021 1831   PLT 221 04/15/2021 1831   MCV 93.8 04/15/2021 1831   MCH 32.3 04/15/2021 1831   MCHC 34.4 04/15/2021 1831   RDW 13.7 04/15/2021 1831   LYMPHSABS 1.2 04/15/2021 1831   MONOABS 0.4 04/15/2021 1831   EOSABS 0.1 04/15/2021 1831   BASOSABS 0.1 04/15/2021 1831   Prior lamotrigine level in 2018 on this dosage was 3.6.  Not particularly high.  Per Dr. Marcos Eke: Clinical Impressions: Cognitive complaints are likely due to underlying psychiatric disorder (bipolar disorder/anxiety disorder with racing thoughts). Diagnostic impressions based on test performances are limited due to the patient's severe inability to fully attend to the tasks. However, based on clinical presentation, I highly doubt the presence of a neurodegenerative dementia. It is much more likely that her racing thoughts and psychiatric disorders are interfering with cognitive   No results found for: "POCLITH", "LITHIUM"  No results found for: "PHENYTOIN", "PHENOBARB", "VALPROATE", "CBMZ"   .res Assessment: Plan:    Bipolar affective disorder, mixed, severe, with psychotic behavior ([place]) - Plan: Oxcarbazepine (TRILEPTAL) 300 MG tablet  Panic disorder with agoraphobia - Plan: Oxcarbazepine (TRILEPTAL) 300 MG tablet  Generalized anxiety disorder - Plan: Oxcarbazepine (TRILEPTAL) 300 MG tablet  Claustrophobia  Mild cognitive impairment  Insomnia due to mental condition  Low vitamin [name] level   Thayer Headings has chronic severe anxiety with panic attacks as well as bipolar disorder with a  history of significant lability.  She has had more severe instability with psychotic sx and cognitive problems off and on lately.  She remains chronically anxious and that has been worse lately.   Psych hospitalization 04/2021 at [facility] for psychosis. She also has mild cognitive impairment as a complicating factor.  She needs her husband's assistance to manage her medications.    More manic SX  with racing thoughts and awakening and more talkative and irritable.  Better with  increase Seroquel IR 400 mg HS well and XR 400 mg nightly abc recent manic sx better with increase dose. Trouble sleeping without extra 100 mg quetiapine HS and takes XR 400 mg and IR 300 mg at 730 pm.    Have pushed Seroquel  as far as possible so start another mood stabilizer Trileptal and increase to 10 mg AM and 300 mg pM  Continue lamotrigine 150 BID.    Consider switch to Caplyta or Saphris bc lower SE and may help anxiety but probably would not be covered by insurance.  Discussed potential metabolic side effects associated with atypical antipsychotics, as well as potential risk for movement side effects. Advised pt to contact office if movement side effects occur.  Disc family's fear of LT SE.  However current meds work and without them sx unmanageable.  Has been able to stop BZ   Pt is chronically needy and requires extended appts DT chronic anxiety and easily stressed.  Supportive and cognitive work are done with the patient on her excessive guilt and now stress of vertigo.  Cannot drive now and wasn't much before.  Also addressing her fears related to her husband's diagnosis of cancer. Supportive therapy dealing with chronic stressors and difficulty with Gdaughter's mental illness..   Pt doesn't feel able to fix her meds by herself. Disc her fears about going out and other phobias and disc behaviour therapy for I. Fight this behaviorally.  No med changes this visit.  Patient is off benzodiazepine and  just taking the 2 versions of quetiapine plus lamotrigine  FU 2 mos   [name], MD, DFAPA   Please see After Visit Summary for patient specific instructions.  No future appointments.    No orders of the defined types were placed in this encounter.      -------------------------------

## 2022-08-09 ENCOUNTER — Telehealth: Payer: Self-pay | Admitting: Psychiatry

## 2022-08-09 NOTE — Telephone Encounter (Signed)
Patient was seen 12/13 (Wednesday) and started on Trileptal. She took it Friday and Saturday and said she developed severe abdominal pain, bowel and bladder incontinence, and impaired vision. She did not take it Sunday and sx resolved. She took it again yesterday with the above sx. Today she didn't take it and sx resolved. She said it did help with her racing thoughts though. She said she always is anxious when you change her medications. She said she had memory problems and I went over the directions with her and she said she was taking 1/2 tab BID.

## 2022-08-09 NOTE — Telephone Encounter (Signed)
Pt had apt 12/13. Started new med. Having issues with new meds. Contact # [phone] Oxcarbazepine new med

## 2022-08-10 DIAGNOSIS — G4730 Sleep apnea, unspecified: Secondary | ICD-10-CM | POA: Diagnosis not present

## 2022-08-10 DIAGNOSIS — F319 Bipolar disorder, unspecified: Secondary | ICD-10-CM | POA: Diagnosis not present

## 2022-08-10 DIAGNOSIS — I10 Essential (primary) hypertension: Secondary | ICD-10-CM | POA: Diagnosis not present

## 2022-08-10 DIAGNOSIS — E1129 Type 2 diabetes mellitus with other diabetic kidney complication: Secondary | ICD-10-CM | POA: Diagnosis not present

## 2022-08-10 DIAGNOSIS — E039 Hypothyroidism, unspecified: Secondary | ICD-10-CM | POA: Diagnosis not present

## 2022-08-10 DIAGNOSIS — K219 Gastro-esophageal reflux disease without esophagitis: Secondary | ICD-10-CM | POA: Diagnosis not present

## 2022-08-10 DIAGNOSIS — E7800 Pure hypercholesterolemia, unspecified: Secondary | ICD-10-CM | POA: Diagnosis not present

## 2022-08-10 DIAGNOSIS — Z1239 Encounter for other screening for malignant neoplasm of breast: Secondary | ICD-10-CM | POA: Diagnosis not present

## 2022-08-11 NOTE — Telephone Encounter (Signed)
Stop the oxcarbazapine DT SE

## 2022-08-12 NOTE — Telephone Encounter (Signed)
Patient notified

## 2022-08-26 ENCOUNTER — Other Ambulatory Visit: Payer: Self-pay | Admitting: Psychiatry

## 2022-08-26 DIAGNOSIS — F4001 Agoraphobia with panic disorder: Secondary | ICD-10-CM

## 2022-08-26 DIAGNOSIS — F411 Generalized anxiety disorder: Secondary | ICD-10-CM

## 2022-08-26 DIAGNOSIS — F3164 Bipolar disorder, current episode mixed, severe, with psychotic features: Secondary | ICD-10-CM

## 2022-08-29 IMAGING — CT CT HEAD W/O CM
3 series · 16 of 47 positions shown, 19 images · non-contrast
Comparison: 06/12/2020

CLINICAL DATA: Dizziness and vertigo for 2 days.

EXAM:
CT HEAD WITHOUT CONTRAST
TECHNIQUE: Contiguous axial images were obtained from the base of the skull
through the vertex without intravenous contrast.

[Series 2: head wo · axial · 0.47mm/px · z∈[-57,+83]mm · 10 of 34 slices shown, 13 images]
[im 3/34  brain]
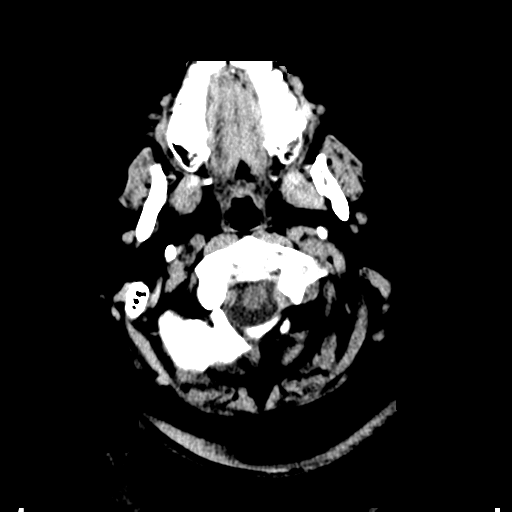
[im 3/34  bone]
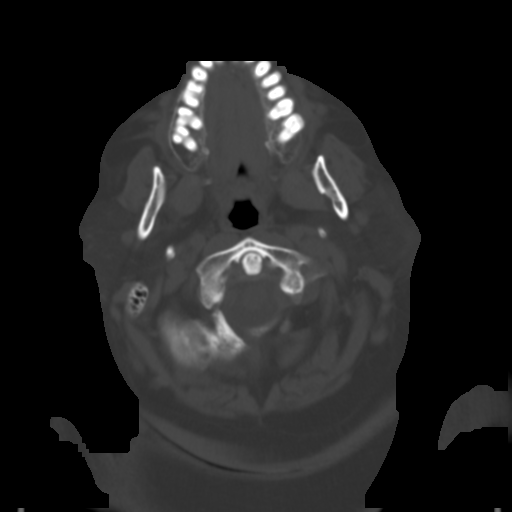
[im 6/34  brain]
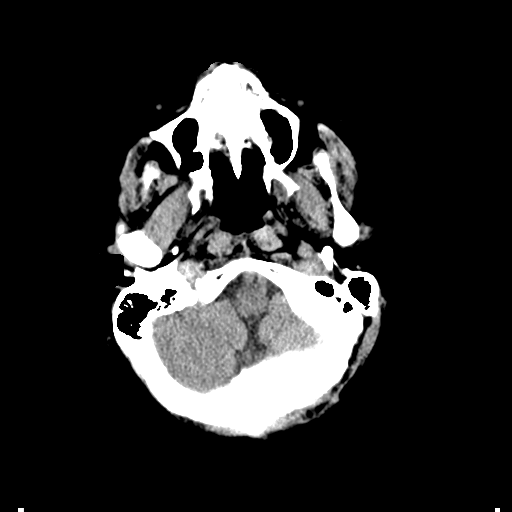
[im 10/34  brain]
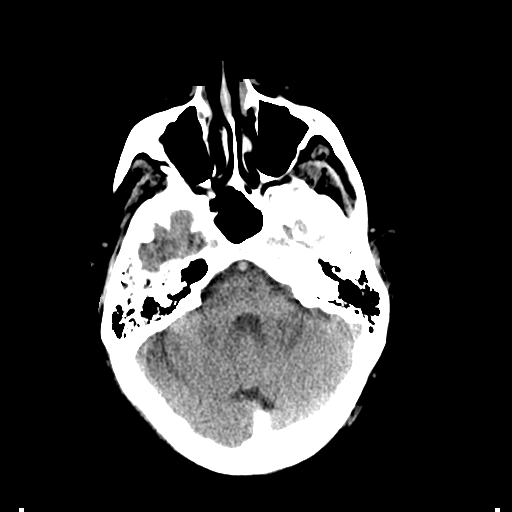
[im 12/34  brain]
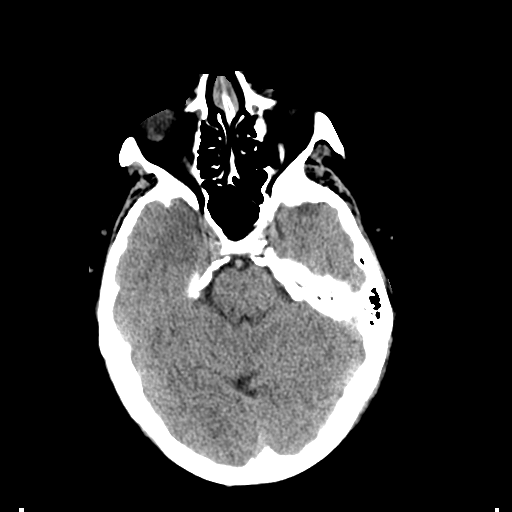
[im 15/34  brain]
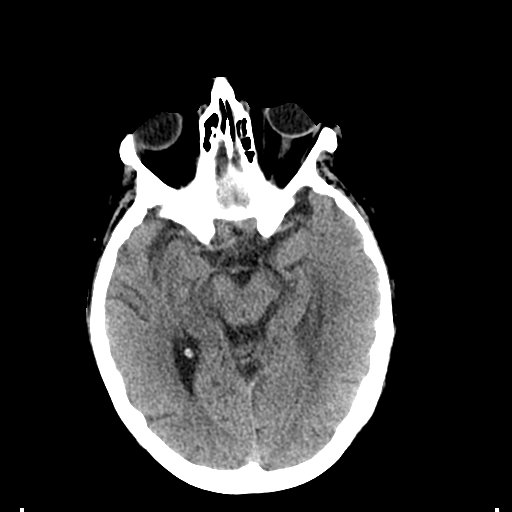
[im 15/34  bone]
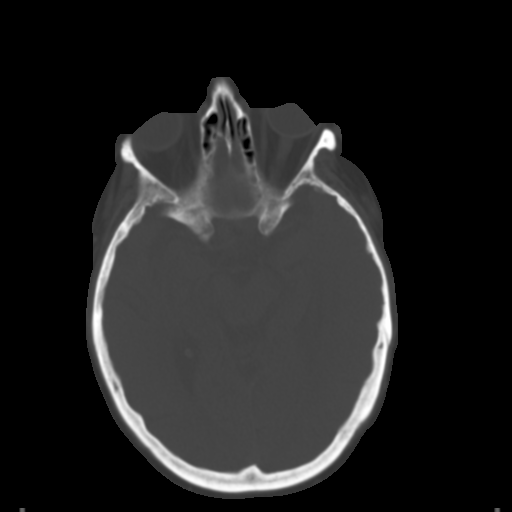
[im 19/34  brain]
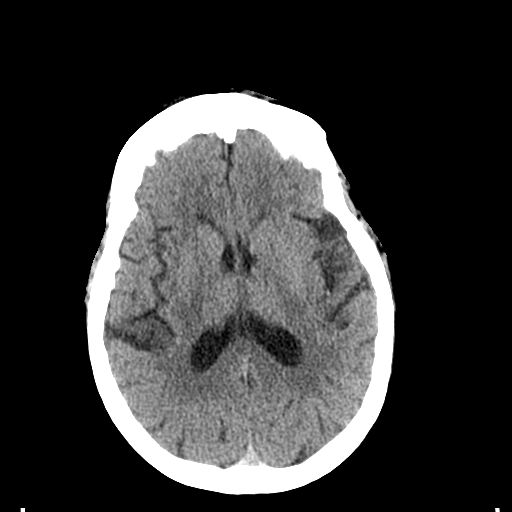
[im 22/34  brain]
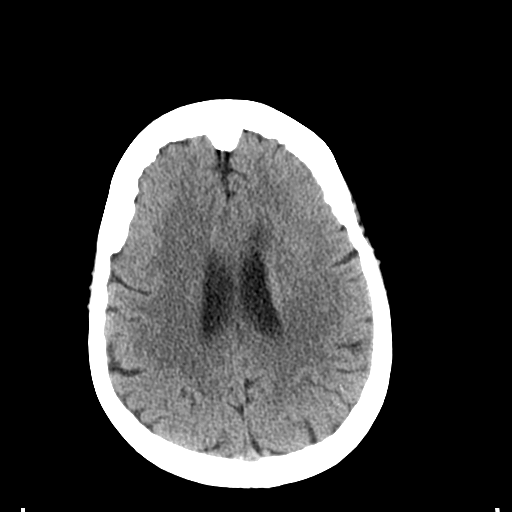
[im 26/34  brain]
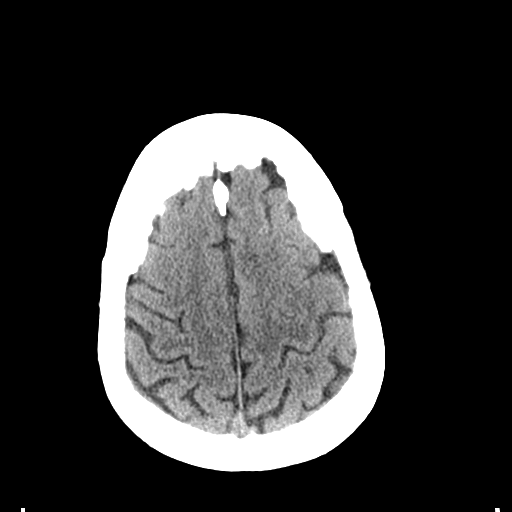
[im 28/34  brain]
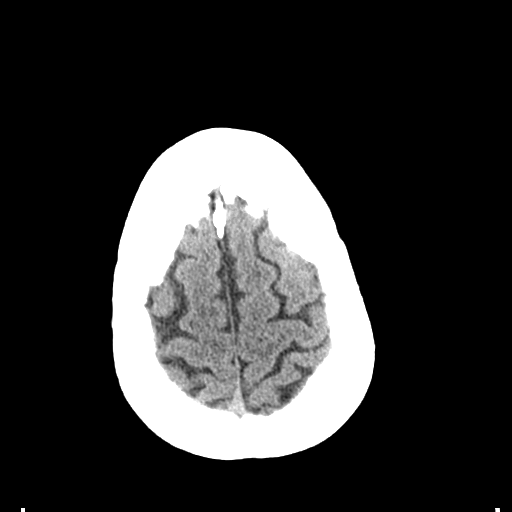
[im 28/34  bone]
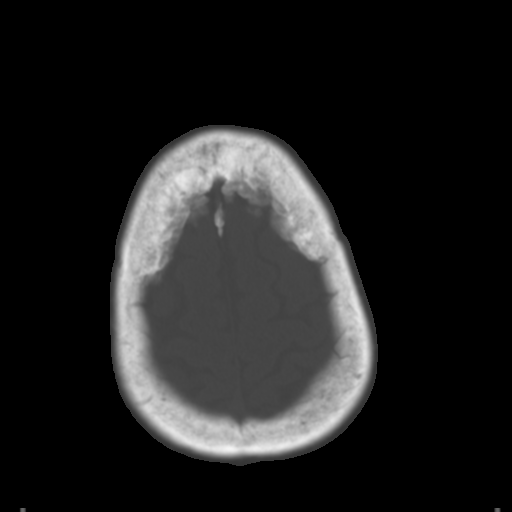
[im 31/34  brain]
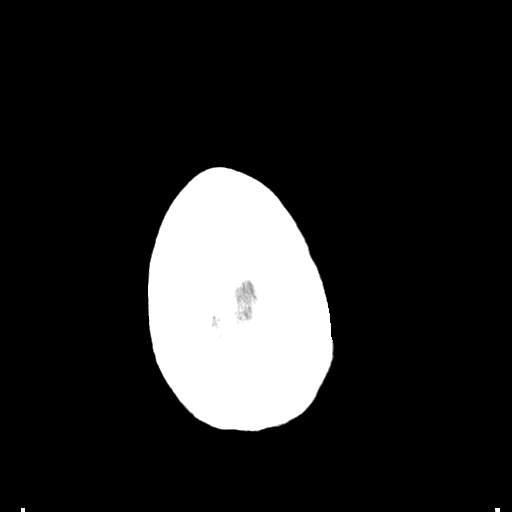

[Series 4: coronal soft tissue · coronal · 0.31mm/px · 3 of 66 slices shown]
[im 22/66  brain]
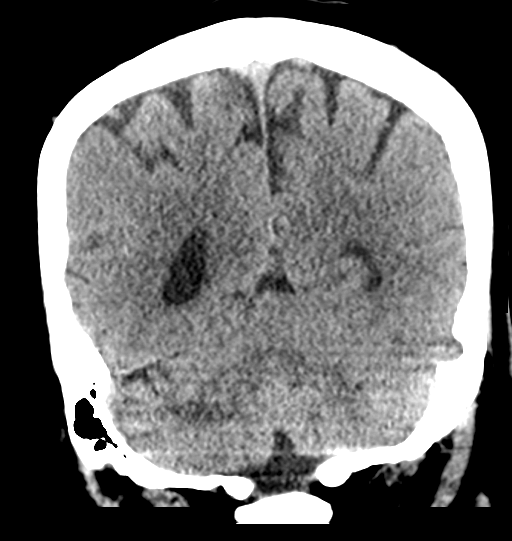
[im 29/66  brain]
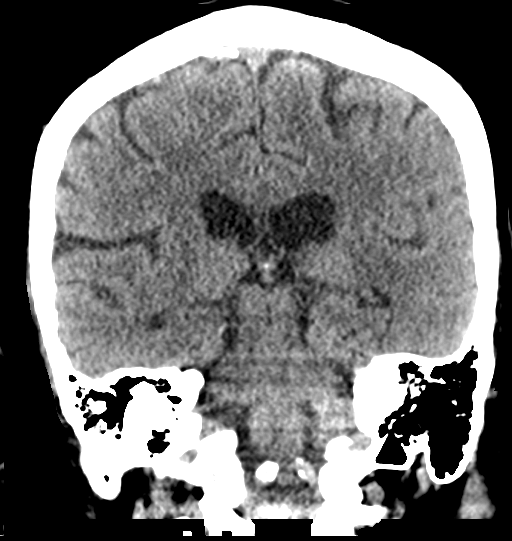
[im 37/66  brain]
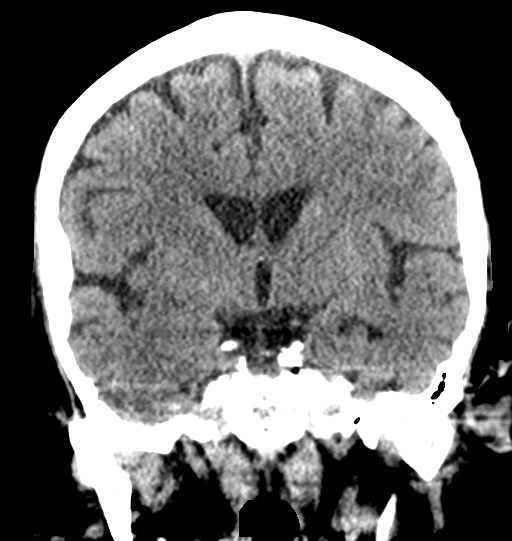

[Series 5: sagittal soft tissue · sagittal · 0.32mm/px · 3 of 52 slices shown]
[im 18/52  brain]
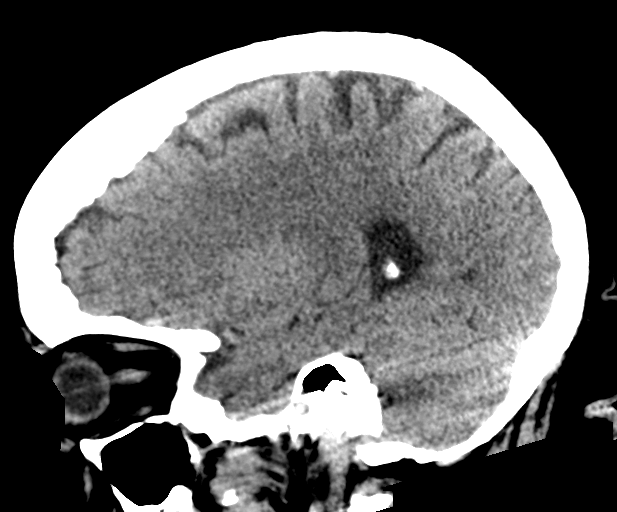
[im 26/52  brain]
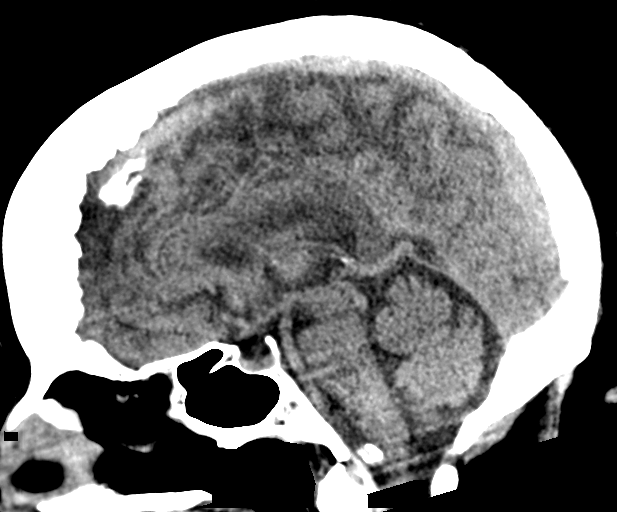
[im 35/52  brain]
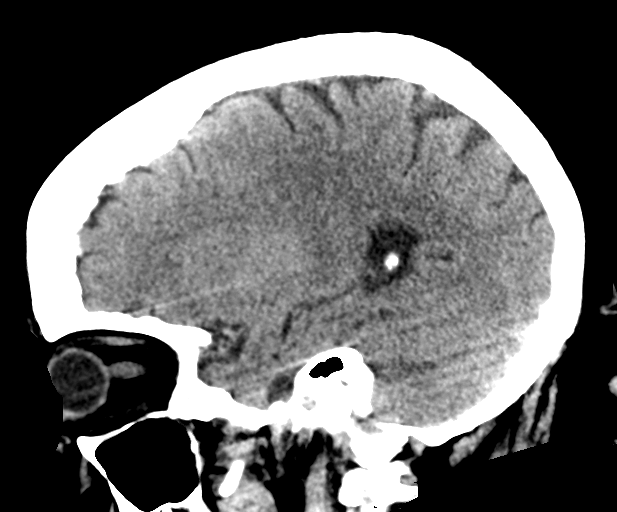

[16 of 47 positions shown; findings below may reference images not displayed]

FINDINGS: Brain: The ventricles are normal in size and configuration. No
extra-axial fluid collections are identified. The gray-white
differentiation is maintained. No CT findings for acute hemispheric
infarction or intracranial hemorrhage. No mass lesions. The
brainstem and cerebellum are normal.

Vascular: No hyperdense vessels or obvious aneurysm.

Skull: No acute skull fracture. No bone lesion. Stable hyperostosis
frontalis interna.

Sinuses/Orbits: The paranasal sinuses and mastoid air cells are
clear. The globes are intact.

Other: No scalp lesions, laceration or hematoma.
IMPRESSION: No acute intracranial findings or mass lesions.

## 2022-09-06 DIAGNOSIS — E119 Type 2 diabetes mellitus without complications: Secondary | ICD-10-CM | POA: Diagnosis not present

## 2022-09-06 DIAGNOSIS — H52203 Unspecified astigmatism, bilateral: Secondary | ICD-10-CM | POA: Diagnosis not present

## 2022-09-06 DIAGNOSIS — Z961 Presence of intraocular lens: Secondary | ICD-10-CM | POA: Diagnosis not present

## 2022-09-06 DIAGNOSIS — H524 Presbyopia: Secondary | ICD-10-CM | POA: Diagnosis not present

## 2022-09-30 DIAGNOSIS — R350 Frequency of micturition: Secondary | ICD-10-CM | POA: Diagnosis not present

## 2022-09-30 DIAGNOSIS — N3021 Other chronic cystitis with hematuria: Secondary | ICD-10-CM | POA: Diagnosis not present

## 2022-10-13 DIAGNOSIS — G4730 Sleep apnea, unspecified: Secondary | ICD-10-CM | POA: Diagnosis not present

## 2022-10-13 DIAGNOSIS — I10 Essential (primary) hypertension: Secondary | ICD-10-CM | POA: Diagnosis not present

## 2022-10-13 DIAGNOSIS — J45909 Unspecified asthma, uncomplicated: Secondary | ICD-10-CM | POA: Diagnosis not present

## 2022-10-13 DIAGNOSIS — F411 Generalized anxiety disorder: Secondary | ICD-10-CM | POA: Diagnosis not present

## 2022-10-13 DIAGNOSIS — N183 Chronic kidney disease, stage 3 unspecified: Secondary | ICD-10-CM | POA: Diagnosis not present

## 2022-10-13 DIAGNOSIS — Z6841 Body Mass Index (BMI) 40.0 and over, adult: Secondary | ICD-10-CM | POA: Diagnosis not present

## 2022-10-13 DIAGNOSIS — E1122 Type 2 diabetes mellitus with diabetic chronic kidney disease: Secondary | ICD-10-CM | POA: Diagnosis not present

## 2022-10-13 DIAGNOSIS — F319 Bipolar disorder, unspecified: Secondary | ICD-10-CM | POA: Diagnosis not present

## 2022-10-17 ENCOUNTER — Ambulatory Visit (INDEPENDENT_AMBULATORY_CARE_PROVIDER_SITE_OTHER): Payer: Medicare HMO | Admitting: Psychiatry

## 2022-10-17 ENCOUNTER — Encounter: Payer: Self-pay | Admitting: Psychiatry

## 2022-10-17 DIAGNOSIS — F411 Generalized anxiety disorder: Secondary | ICD-10-CM

## 2022-10-17 DIAGNOSIS — F40240 Claustrophobia: Secondary | ICD-10-CM

## 2022-10-17 DIAGNOSIS — F3164 Bipolar disorder, current episode mixed, severe, with psychotic features: Secondary | ICD-10-CM | POA: Diagnosis not present

## 2022-10-17 DIAGNOSIS — F5105 Insomnia due to other mental disorder: Secondary | ICD-10-CM | POA: Diagnosis not present

## 2022-10-17 DIAGNOSIS — R7989 Other specified abnormal findings of blood chemistry: Secondary | ICD-10-CM | POA: Diagnosis not present

## 2022-10-17 DIAGNOSIS — F4001 Agoraphobia with panic disorder: Secondary | ICD-10-CM

## 2022-10-17 DIAGNOSIS — G3184 Mild cognitive impairment, so stated: Secondary | ICD-10-CM

## 2022-10-17 NOTE — Progress Notes (Signed)
[name] [id number] 06-02-1951 72 y.o.   Virtual Visit via Telephone Note  I connected with pt by telephone and verified that I am speaking with the correct person using two identifiers.   I discussed the limitations, risks, security and privacy concerns of performing an evaluation and management service by telephone and the availability of in person appointments. I also discussed with the patient that there may be a patient responsible charge related to this service. The patient expressed understanding and agreed to proceed.  I discussed the assessment and treatment plan with the patient. The patient was provided an opportunity to ask questions and all were answered. The patient agreed with the plan and demonstrated an understanding of the instructions.   The patient was advised to call back or seek an in-person evaluation if the symptoms worsen or if the condition fails to improve as anticipated.  I provided 30 minutes of non-face-to-face time during this encounter. . The patient was located at home and the provider was located office. Session from 200-230p  Subjective:   Patient ID:  [name] is a 72 y.o. (DOB 01/29/1951) female.  Chief Complaint:  Chief Complaint  Patient presents with   Follow-up   Depression   Anxiety   Medication Reaction    Anxiety Symptoms include confusion, decreased concentration, dizziness and nervous/anxious behavior. Patient reports no chest pain, palpitations or suicidal ideas.        [name] presents to the office today for follow-up of anxiety and depression and poor cognition.  At her visit October 12, 2018.  Because of her poor cognition which did seem to get even worse lately with her memory and given a negative unremarkable medical work-up by her primary care doctor the decision was made to change her from Xanax which she has been on for years to lorazepam.  Lorazepam often has less cognitive side effects than does  alprazolam.  She had a lot of difficulty with headaches and insomnia with the transition.  She called several times in that transition.  Her husband reported that her cognition was better after the transition however.  She was satisfied with the Ativan.  There was an attempt to reduce her lamotrigine to 150 mg also in hopes of improving cognition but it was uncertain at the last visit where there she had actually done so.  visit December 2020 without med changes.  She had continued to do cognitively better off alprazolam and on lorazepam.  seen March 2021.   Better than December visit.  Feeling good and less down.  Adjusting time of med helped excessively sleep.  Brief hypomanic episode resolved where she cleaned all night.  Sleep 10 hours . No med changes.  12/23/19 appt.  Noted: More depression, crying and racing thoughts and distressed to the point of wondering if needed the hospital.  Sleep got worse and needed Seroquel IR 200 also bc went 2 days without sleep.  It worked and helped sleep.  Worry a lot and melancholy this week.  Cries over what happens dealing with her daugher.[name] has prostate and kidney cancer again.  Had surgery June 10, 2019 and she's cried a lot.  He's incontinent and very uncomfortable.  He usually has positive attitude.   Worry over his health and how she'll do if something happens to him.  She's afraid of Covid.  Trusting in God usually.  She realizes she  Needs to drive occasionally bc of his health.  [name] had  prostate CA and lived to 22 yo.  Uncle died of it.  Not markedly depressed. Can't walk much dT plantar fascitis. Also stressed over [name] 70 yo not doing school work and causing problems.  Her [name] [name] is a bad parent and not helpful.  Stressed over YolandaR. [name], [name] and downs too and they have periodic conflict.   More down and anxious.   Guilt tendency.   Patient reports difficulty with sleep initiation or maintenance in part DT anxiety and racing  thoughts.Intermittent sleep problems with days and nights mixed up.  Not napping. Not manic.[name] Denies appetite disturbance.  Patient reports that energy and motivation have been good.  Patient has some difficulty with concentration.  Chronic memory problems.   Patient denies any suicidal ideation. Plan:   No changes in meds indicated except agree to take the extra Seroquel 200 IR HS for mixed manic sx that are worse right now.  May be having mixed seasonal sx.  02/20/2020 appointment with the following noted: Doing fairly good except chronic worry over [name] and Yolanda [name].  Some days cries a lot over it.  [name] having a hard time lately.  Still easily stressed but not more than usual. Stopped extra quetiapine 200 mg HS bc no longer needed it. Anxiety is chronic but at baseline as is depression and not manic now. [name] helps her take the meds.   Memory is still bad but has been worse when on Xanax instead fo Ativan.  Sleep is fine from 10 to 9 which is much better than in the past.  Occ spells of staying up for 2 days and then needs to take quetiapine.   [name] [name] goes for FU soon for cancer check up. Plan: no med changes  05/21/20 appt with following noted: "A lot of problems".  Can't drive bc fear and anxiety.  Needed to drive [name] and couldn't. Fall off toilet twice. Dizzy and HA 2 days ago with a lot of crying and stress with daughter. Leaving home even coming here causes anxiety.    Cataract surgery twice. Fear of [name] being diagnosed with recurrent cancer. Chronic anxiety greather than depression.   Don't know what to do with daughter. Sleep, appetite and energy are OK. Tolerating meds. Sees [name] one day weekly. [name] helps with meds bc she's forgetful and easily confused.  Loses train of thoughts. Dog has separation anxiety and so they don't go out much.  Plan: no med changes  08/18/20 appt with following noted: 2 ER visitis with vertigo.  RX diazepam and meclizine. Rarely takes former but takes latter  regularly. Shakes so bad it made her cry and scream.  Started PT. Continues lorazepam low dose 0.5 mg AM and 1.0 mg HS.  Still has dizziness and vertigo. Memory is still bad and [name] administers meds with pill box. Chronic anxiety.  Depression manageable. Sleep OK and tolerates meds otherwise. [name] driving her crazy. Plan: no med changes  11/17/20 appt with following noted: Has had periods of vertigo and meds for it.  Then had manic sx with cleaning and couldn't sleep for 24 hours. Stopped quetiapine 200 and continued quetiapine XR 800. Also started diazepam 5 mg BID for vertigo and continued lorazepam for anxiety.  No meclizine now.  Vertigo stopped and manic sx stopped.  Tolerating meds now. Still feels [name] is mean to her too often and she will cry over it. Mandy's ExH [name] in jail and is the [name] of Mandy's [name] [name] 72 yo.  Will be there for 3 mos. Not markedly depressed.  But hates herself some days.  Still memory problems about the same.  Sleeping OK now.  Periods of mood swings. Easily anxious with relatives visiting.   Plan: Mixed manic sx resolved for now. No med changes today.  02/17/2021 appointment with the following noted: Not good.  "A whole lof of everything."  Shaking for mos. It comes and goes.  Shaking at times makes her think she'll have a nervous breakdown.   Went to ER and dx vertigo.  Has tried meclizine and Valium but scared to take it.  Big shakes again today.  Memory is not good. Sleep to escape anxiety.  Scared to leave home. Not sure what brings on the spells of shaking. Today sx hit her as soon as she awoke.  Plan: No med changes  03/12/2021 phone call: Patient with a lot of anxiety.  Husband reported the patient had been hallucinating and was paranoid that he had left her.  Some confusion over identity of people that were around her.  It was suggested that she have medical work-up because it sounded like she had delirium and may have a UTI causing  that. 03/17/2021 psychiatric hospitalization. 03/22/2021 MD response:  Note Patient has been very unstable with bipolar disorder mixed symptoms lately also with some delirium of unknown cause.  She will be seeing her primary care physician tomorrow.  Due to the severity of her symptoms it is suggested that she add haloperidol 2 mg nightly to her current quetiapine 800 mg nightly since we cannot increase the quetiapine dose further.  This may help with her confusion and agitation.  It is okay to talk with her husband because he administers her medication and because she is too confused to understand the instructions.     04/16/21 [name] called with following:  Pt.'s husband called.  She is currently in [facility] waiting for an inpatient pyschiatric bed.  Pt advised her husband that Dr Clovis Pu told her to stop taking the Lorazepam.  He said he is not aware of that chang in her prescription.  He would like someone to call him and advise if there were any changes to her meds so he stays aware and can let the hospital know if needed. Husband was informed we had not made any medication changes and would not have suggested she abruptly stopped lorazepam which could cause withdrawal and other kinds of problems.  05/11/2021 appointment with the following noted:  spoke with [name] for info At hospital was taken off Ativan, buspirone, and Seroquel reduced to 500 mg HS (as 1 of XR 400 mg  and 1/2 of 200 mg IR quetiapine).  Had sedative SE at 200 mg queiapinte !R.  Off risperidone and haloperidol.   Hospitalized at [facility] for 9 days.  They didn't feel communication was good with them. But she is better now than she was.  Had been psychotic PTH.  Last 2-3 days getting more hyper and can't wind down and hard time going to sleep.  Came home 05/03/21. She didn't like the restrictions at the hospital.  No panic lately and wants something to help her sleep. Plan; Mixed manic sx including psychosis recently but not  psychotic now but insomnia.. Will get more psychotic if she doesn't sleep but apparently didn't tolerate Seroquel IR 200 mg HS well so will increase to quetiapine IR 150 mg HS and XR 400 mg nightly. Continue lamotrigine.    06/11/21  appt:  [name] [name]. [name] thinks she's doing goood. Doing very fine.  No trouble sleeping.  Sometimes in morning feels sad bc she knows she will have racing thoughts, but it resolves after a little while.  Racing thoughts since hospital.  Worries over money chronically. To bed 1015-7:30.  Maybe longer which is normal. [name] administers meds.  Some wordfinding problems.  Does journal which helps. No SE with meds.  Yesterday was hard with anxiety but today is better.  On lamotrigine 150 BID, SERoquel XR 400 + Seroquel IR 150 mg HS. Plan: Much better but not fully resolved.  Mixed manic sx including psychosis recently but not psychotic but insomnia last visit resolved with the following... apparently didn't tolerate Seroquel IR 200 mg HS well so increased quetiapine IR 150 mg HS and XR 400 mg nightly and now she is sleeping but still having some racing thoughts. Continue lamotrigine 150 BID.    07/14/2021 appointment with the following noted: Bad day yesterday feeling very depressed without anxiety. Not sleeping well lately.  Thinks it's related to less Seroquel than in the past.  Some EMA.  Always normal slept a lot. Compulsive cleaning and everything in it's place.  Arranges soap dispensers. Cleaning in the middle of the night. No recent psychotic sx but still racing thoughts and chronic $ worries. Still afraid to leave home.  09/20/2021 appt noted: Great overall.  Occ insomnia with EMA.  Still worry but tries not let it consume me.  Still agoraphobia and hates to go out to dentists and doctors.  Challenge to go to dentist.  Can still dread things. Brief nap in daytimes. No SE [name] administers meds. Seroquel 300 mg HS, XR 400 mg HS.  Lamotrigine 150 Patient reports stable mood  and denies depressed or irritable moods.   Patient denies difficulty with sleep initiation or maintenance. Denies appetite disturbance.  Patient reports that energy and motivation have been good.  Patient denies any difficulty with concentration.  Patient denies any suicidal ideation.  01/20/22 appt noted: also talked with [name] Doesn't like waiting 4 mos between appts. Was manic before increasing Seroquel with irritability, not sleeping a couple of days and excessive cleaning. Seroquel increased to IR 400 mg HS.  No SE I feel much Better now.  Doing better at time with anxiety.   Chronic worry over [name] and Yolanda. Yolanda with psych problems. Sleeping good again. Racing thoughts are better but not gone Tolerating meds.   No further changes desired.   Plan: Seroquel IR 400 mg HS well and XR 400 mg nightly abc recent manic sx better with increase dose. Continue lamotrigine 150 mg twice daily  04/28/2022 appointment noted: Trouble sleeping without extra 100 mg quetiapine HS and takes XR 400 mg and IR 300 mg at 730 pm.  Otherwise will be up until 3 AM.  Sleep from 11 until 830.  Good unless anxious. Doing fairly well except 2 weeks bad spell from July 31 related to [place] receiving call from woman he worked with 8 years ago and triggered fear ans suspiciousness and insecurity and overwhelmed her.  She cried and over reacted.  Rough 2 weeks but otherwise ok. Real worried about [name] who is self cutting.  Seeing therapist and psychiatrist.  Her [name] is terrible and inattentive.  She is 72 yo and never happy. I still have agoraphobia and doesn't want to go ut places.  Her dog also has severe anxiety and doesn't like to be alone too.  Still fearul  about money.  08/03/22 appt noted: Doing pretty well overall.  Chronic worry over money.  Met old friend on Facebook.  She would contact Marcie Bal a lot.  Was making her sad.  Would cry every time the woman called.  ,This was wearing her out bc woman was hyperverbal.  Cut the  woman off.  Since stopped talking to her then no longer crying. When starts to get dark then racing thoughts.  Driving me crazy.  Chronic worry .  Chronic memorhy problems and tendency to be obsessed over fear of dementia. Irritable and confused.  Might nap 30 min and rest her mind and awakens feeling better. Continues the same meds: lamotrigine and quetiapine. Gained 20# last year. Plan: Have pushed Seroquel  as far as possible so start another mood stabilizer Trileptal and increase to 10 mg AM and 300 mg pM  08/09/2022 phone call: She took oxcarbazepine 150 mg Friday and Saturday and developed severe abdominal pain, bowel and bladder incontinence, and impaired vision.  She skipped it on Sunday and symptoms resolved.  She took it again yesterday and the side effects returned.  States it did help with the racing thoughts but she is unable to tolerate it.  That was just on half of oxcarbazepine tablet. MD response: DC oxcarbazepine due to side effects.  10/17/22 appt noted: Britt Boozer today. Still racing thoughts and cleaning a lot.  More anxiety than depression.  Rough road.  Worries about everything.   Thoughts jump from one thing to another.  Can't get away from it.  Can't focus.  Hard to take instruction bc of this.    Prior psychiatric medication trials include paroxetine, Lexapro,  risperidone 4 mg which was sedating, aripiprazole,  perphenazine,  Seroquel 1000, olanzapine for anxiety, and  lithium which was not tolerated even at very low dosages.  Topamax for anxiety, Oxcarbazepine 150 mg bowel and bladder incontinence and impaired vision. She was intolerant of both Namenda and Aricept.   Lamotrigine 300.   Xanax, Ativan, diazepam 5 mg BID   This is not an exhaustive list.   Psych hospitalization 04/2021 at [facility] for psychosis.  At the visit in February 2020 Deani was more acutely confused recently for no clear reason.   She had a negative medical work-up for causes  of short-term memory problems and confusion.  Therefore the decision was made to try switching her from alprazolam to lorazepam at the lowest possible dose in hopes of improving her cognition.  This was successful and her cognition was improved significantly and her husband verified this.     Review of Systems:  Review of Systems  Eyes:  Positive for visual disturbance.  Cardiovascular:  Negative for chest pain and palpitations.  Musculoskeletal:  Positive for arthralgias, back pain and gait problem.  Neurological:  Positive for dizziness. Negative for weakness.  Psychiatric/Behavioral:  Positive for confusion, decreased concentration and sleep disturbance. Negative for agitation, behavioral problems, dysphoric mood, hallucinations, self-injury and suicidal ideas. The patient is nervous/anxious. The patient is not hyperactive.     Medications: I have reviewed the patient's current medications.  Current Outpatient Medications  Medication Sig Dispense Refill   acetaminophen (TYLENOL) 325 MG tablet Take 650 mg by mouth every 6 (six) hours as needed for mild pain or headache.     amLODipine (NORVASC) 5 MG tablet Take 5 mg by mouth every evening.     aspirin 81 MG tablet Take 81 mg by mouth daily.     cephALEXin (KEFLEX)  250 MG capsule Take 250 mg by mouth at bedtime.     famotidine (PEPCID) 20 MG tablet Take 20 mg by mouth in the morning and at bedtime.     furosemide (LASIX) 20 MG tablet Take 20 mg by mouth daily as needed (for severe swelling).     lamoTRIgine (LAMICTAL) 150 MG tablet TAKE 1 TABLET TWICE DAILY 180 tablet 0   levocetirizine (XYZAL) 5 MG tablet Take 5 mg by mouth every evening.     levothyroxine (SYNTHROID) 100 MCG tablet Take 100 mcg by mouth daily before breakfast.     lisinopril (PRINIVIL,ZESTRIL) 20 MG tablet Take 20 mg by mouth daily.     meloxicam (MOBIC) 15 MG tablet Take 15 mg by mouth daily.     metoprolol succinate (TOPROL-XL) 50 MG 24 hr tablet Take 50 mg by mouth  every evening. Take with or immediately following a meal.     Multiple Vitamin (MULTIVITAMIN) tablet Take 1 tablet by mouth daily.     polyethylene glycol (MIRALAX / GLYCOLAX) packet Take 17 g by mouth 2 (two) times daily as needed for mild constipation (MIX AS DIRECTED AND DRINK).     potassium chloride SA (K-DUR,KLOR-CON) 20 MEQ tablet Take 20 mEq by mouth daily.     QUEtiapine (SEROQUEL XR) 400 MG 24 hr tablet 1 at 730PM 90 tablet 1   QUEtiapine (SEROQUEL) 100 MG tablet Take 1 tablet (100 mg total) by mouth at bedtime. 90 tablet 1   QUEtiapine (SEROQUEL) 300 MG tablet TAKE 1 TABLET AT BEDTIME 90 tablet 10   simvastatin (ZOCOR) 40 MG tablet Take 1 tablet (40 mg total) by mouth every evening. 30 tablet 0   glimepiride (AMARYL) 1 MG tablet Take 1 mg by mouth daily.     No current facility-administered medications for this visit.    Medication Side Effects: None  Allergies:  Allergies  Allergen Reactions   Maxzide [Triamterene-Hctz] Other (See Comments)    Hurt patient's kidneys   Celexa [Citalopram Hydrobromide] Nausea Only   Metformin And Related Nausea And Vomiting   Other Nausea And Vomiting and Other (See Comments)    Unnamed medication hurt the patient's kidneys   Reglan [Metoclopramide] Nausea Only   Risperdal [Risperidone] Nausea Only and Other (See Comments)    Also caused hallucinations   Trazodone And Nefazodone Nausea Only   Zestril [Lisinopril] Nausea Only   Lithium Palpitations and Other (See Comments)    Shakes and delusional pt started seeing things      Past Medical History:  Diagnosis Date   Anxiety    Arthritis    Asthma    Bipolar 1 disorder (HCC)    CHF (congestive heart failure) (HCC)    Complication of anesthesia    left a bad taste in my mouth it has lasted since january    Depression    Diabetes mellitus    Hernia    umbilical   Hyperlipidemia    Hypertension    Hypothyroidism    Pericarditis, viral 2010 October   Sleep apnea    uses cpap  setting of 15    Family History  Problem Relation Age of Onset   Diabetes Mother    Hypertension Mother    Hyperlipidemia Mother    Diabetes [name]    Hypertension [name]    Hyperlipidemia [name]    Hypertension Sister     Social History   Socioeconomic History   Marital status: Married    Spouse name: Not on file  Number of children: Not on file   Years of education: Not on file   Highest education level: Not on file  Occupational History   Not on file  Tobacco Use   Smoking status: Never   Smokeless tobacco: Never  Substance and Sexual Activity   Alcohol use: No   Drug use: No   Sexual activity: Not on file  Other Topics Concern   Not on file  Social History Narrative   Not on file   Social Determinants of Health   Financial Resource Strain: Not on file  Food Insecurity: Not on file  Transportation Needs: Not on file  Physical Activity: Not on file  Stress: Not on file  Social Connections: Not on file  Intimate Partner Violence: Not on file    Past Medical History, Surgical history, Social history, and Family history were reviewed and updated as appropriate.   Please see review of systems for further details on the patient's review from today.   Objective:   Physical Exam:  There were no vitals taken for this visit.  Physical Exam Neurological:     Mental Status: She is alert and oriented to person, place, and time.     Cranial Nerves: No dysarthria.  Psychiatric:        Attention and Perception: Perception normal. She is inattentive.        Mood and Affect: Mood is anxious. Mood is not depressed. Affect is labile and tearful.        Speech: Speech is rapid and pressured. Speech is not slurred.        Behavior: Behavior is cooperative.        Thought Content: Thought content normal. Thought content is not paranoid or delusional. Thought content does not include homicidal or suicidal ideation. Thought content does not include suicidal plan.         Cognition and Memory: Cognition normal. She exhibits impaired recent memory.        Judgment: Judgment normal.     Comments: Insight intact   Talkative chronically but worse.  Lab Review:     Component Value Date/Time   NA 143 04/15/2021 1831   K 4.2 04/15/2021 1831   CL 106 04/15/2021 1831   CO2 27 04/15/2021 1831   GLUCOSE 162 ([name]) 04/15/2021 1831   GLUCOSE 154 ([name]) 09/01/2006 1100   BUN 15 04/15/2021 1831   CREATININE 0.91 04/15/2021 1831   CREATININE 0.93 07/19/2011 1110   CALCIUM 11.1 ([name]) 04/15/2021 1831   PROT 7.5 04/15/2021 1831   ALBUMIN 4.2 04/15/2021 1831   AST 26 04/15/2021 1831   ALT 21 04/15/2021 1831   ALKPHOS 104 04/15/2021 1831   BILITOT 0.7 04/15/2021 1831   GFRNONAA >60 04/15/2021 1831   GFRAA >60 03/23/2016 1933       Component Value Date/Time   WBC 5.7 04/15/2021 1831   RBC 4.37 04/15/2021 1831   HGB 14.1 04/15/2021 1831   HCT 41.0 04/15/2021 1831   PLT 221 04/15/2021 1831   MCV 93.8 04/15/2021 1831   MCH 32.3 04/15/2021 1831   MCHC 34.4 04/15/2021 1831   RDW 13.7 04/15/2021 1831   LYMPHSABS 1.2 04/15/2021 1831   MONOABS 0.4 04/15/2021 1831   EOSABS 0.1 04/15/2021 1831   BASOSABS 0.1 04/15/2021 1831   Prior lamotrigine level in 2018 on this dosage was 3.6.  Not particularly high.  Per Dr. Marcos Eke: Clinical Impressions: Cognitive complaints are likely due to underlying psychiatric disorder (bipolar disorder/anxiety disorder with racing thoughts).  Diagnostic impressions based on test performances are limited due to the patient's severe inability to fully attend to the tasks. However, based on clinical presentation, I highly doubt the presence of a neurodegenerative dementia. It is much more likely that her racing thoughts and psychiatric disorders are interfering with cognitive   No results found for: "POCLITH", "LITHIUM"   No results found for: "PHENYTOIN", "PHENOBARB", "VALPROATE", "CBMZ"   .res Assessment: Plan:    Bipolar affective disorder,  mixed, severe, with psychotic behavior ([place])  Panic disorder with agoraphobia  Generalized anxiety disorder  Claustrophobia  Mild cognitive impairment  Insomnia due to mental condition  Low vitamin [name] level   Thayer Headings has chronic severe anxiety with panic attacks as well as bipolar disorder with a history of significant lability.  She has had more severe instability with psychotic sx and cognitive problems off and on lately.  She remains chronically anxious and that has been worse lately.   Psych hospitalization 04/2021 at [name] for psychosis. She also has mild cognitive impairment as a complicating factor.  She needs her husband's assistance to manage her medications.    More manic SX  with racing thoughts and awakening and more talkative and irritable.  Tends to overclean things.    Better with  increase Seroquel IR 400 mg HS well and XR 400 mg nightly abc recent manic sx better with increase dose. Trouble sleeping without extra 100 mg quetiapine HS and takes XR 400 mg and IR 300 mg at 730 pm.    Have pushed Seroquel  as far as possible so consider retrying oxcarb at very low dose 75 mg, but defer.  Continue lamotrigine 150 BID.    Consider switch to Caplyta or Saphris bc lower SE and may help anxiety but probably would not be covered by insurance.  Discussed potential metabolic side effects associated with atypical antipsychotics, as well as potential risk for movement side effects. Advised pt to contact office if movement side effects occur.  Disc family's fear of LT SE.  However current meds work and without them sx unmanageable.  Has been able to stop BZ   Pt is chronically needy and requires extended appts DT chronic anxiety and easily stressed.  Supportive and cognitive work are done with the patient on her excessive guilt and now stress of vertigo.  Cannot drive now and wasn't much before.  Also addressing her fears related to her husband's diagnosis of  cancer. Supportive therapy dealing with chronic stressors and difficulty with Gdaughter's mental illness..   Pt doesn't feel able to fix her meds by herself. Disc her fears about going out and other phobias and disc behaviour therapy for I. Fight this behaviorally.  No med changes this visit.  Patient is off benzodiazepine and just taking the 2 versions of quetiapine plus lamotrigine  FU 2 mos   [name], MD, DFAPA   Please see After Visit Summary for patient specific instructions.  No future appointments.     No orders of the defined types were placed in this encounter.      -------------------------------

## 2022-11-11 DIAGNOSIS — Z6841 Body Mass Index (BMI) 40.0 and over, adult: Secondary | ICD-10-CM | POA: Diagnosis not present

## 2022-11-11 DIAGNOSIS — E119 Type 2 diabetes mellitus without complications: Secondary | ICD-10-CM | POA: Diagnosis not present

## 2022-11-13 ENCOUNTER — Other Ambulatory Visit: Payer: Self-pay | Admitting: Psychiatry

## 2022-11-13 DIAGNOSIS — F3164 Bipolar disorder, current episode mixed, severe, with psychotic features: Secondary | ICD-10-CM

## 2022-12-17 ENCOUNTER — Emergency Department (HOSPITAL_COMMUNITY): Payer: Medicare HMO

## 2022-12-17 ENCOUNTER — Encounter (HOSPITAL_COMMUNITY): Payer: Self-pay

## 2022-12-17 ENCOUNTER — Emergency Department (HOSPITAL_COMMUNITY)
Admission: EM | Admit: 2022-12-17 | Discharge: 2022-12-18 | Disposition: A | Discharge status: Home / Self Care | Payer: Medicare HMO | Attending: Emergency Medicine | Admitting: Emergency Medicine

## 2022-12-17 DIAGNOSIS — R109 Unspecified abdominal pain: Secondary | ICD-10-CM | POA: Diagnosis present

## 2022-12-17 DIAGNOSIS — N132 Hydronephrosis with renal and ureteral calculous obstruction: Secondary | ICD-10-CM | POA: Insufficient documentation

## 2022-12-17 DIAGNOSIS — E1165 Type 2 diabetes mellitus with hyperglycemia: Secondary | ICD-10-CM | POA: Diagnosis not present

## 2022-12-17 DIAGNOSIS — Z7982 Long term (current) use of aspirin: Secondary | ICD-10-CM | POA: Insufficient documentation

## 2022-12-17 DIAGNOSIS — N201 Calculus of ureter: Secondary | ICD-10-CM

## 2022-12-17 DIAGNOSIS — E039 Hypothyroidism, unspecified: Secondary | ICD-10-CM | POA: Insufficient documentation

## 2022-12-17 DIAGNOSIS — Z79899 Other long term (current) drug therapy: Secondary | ICD-10-CM | POA: Insufficient documentation

## 2022-12-17 DIAGNOSIS — N390 Urinary tract infection, site not specified: Secondary | ICD-10-CM

## 2022-12-17 DIAGNOSIS — J45909 Unspecified asthma, uncomplicated: Secondary | ICD-10-CM | POA: Insufficient documentation

## 2022-12-17 DIAGNOSIS — N134 Hydroureter: Secondary | ICD-10-CM | POA: Diagnosis not present

## 2022-12-17 DIAGNOSIS — Z7984 Long term (current) use of oral hypoglycemic drugs: Secondary | ICD-10-CM | POA: Insufficient documentation

## 2022-12-17 DIAGNOSIS — N1330 Unspecified hydronephrosis: Secondary | ICD-10-CM | POA: Diagnosis not present

## 2022-12-17 DIAGNOSIS — I509 Heart failure, unspecified: Secondary | ICD-10-CM | POA: Diagnosis not present

## 2022-12-17 DIAGNOSIS — I110 Hypertensive heart disease with heart failure: Secondary | ICD-10-CM | POA: Diagnosis not present

## 2022-12-17 LAB — BASIC METABOLIC PANEL
Anion gap: 12 (ref 5–15)
BUN: 18 mg/dL (ref 8–23)
CO2: 23 mmol/L (ref 22–32)
Calcium: 10.5 mg/dL — ABNORMAL HIGH (ref 8.9–10.3)
Chloride: 104 mmol/L (ref 98–111)
Creatinine, Ser: 1 mg/dL (ref 0.44–1.00)
GFR, Estimated: >60 mL/min (ref >60)
Glucose, Bld: 181 mg/dL — ABNORMAL HIGH (ref 70–99)
Potassium: 3.8 mmol/L (ref 3.5–5.1)
Sodium: 139 mmol/L (ref 135–145)

## 2022-12-17 LAB — URINALYSIS, ROUTINE W REFLEX MICROSCOPIC
Bilirubin Urine: NEGATIVE
Glucose, UA: NEGATIVE mg/dL
Ketones, ur: NEGATIVE mg/dL
Nitrite: NEGATIVE
Protein, ur: 100 mg/dL — AB
RBC / HPF: >50 RBC/hpf (ref 0–5)
Specific Gravity, Urine: 1.019 (ref 1.005–1.030)
WBC, UA: >50 WBC/hpf (ref 0–5)
pH: 5 (ref 5.0–8.0)

## 2022-12-17 LAB — CBC
HCT: 42.7 % (ref 36.0–46.0)
Hemoglobin: 14.4 g/dL (ref 12.0–15.0)
MCH: 31.2 pg (ref 26.0–34.0)
MCHC: 33.7 g/dL (ref 30.0–36.0)
MCV: 92.6 fL (ref 80.0–100.0)
Platelets: 222 10*3/uL (ref 150–400)
RBC: 4.61 MIL/uL (ref 3.87–5.11)
RDW: 13.6 % (ref 11.5–15.5)
WBC: 6.8 10*3/uL (ref 4.0–10.5)
nRBC: 0 % (ref 0.0–0.2)

## 2022-12-17 MED ORDER — ONDANSETRON HCL 4 MG/2ML IJ SOLN
4.0000 mg | Freq: Once | INTRAMUSCULAR | Status: AC
  Administered 2022-12-17: 4 mg via INTRAVENOUS
  Filled 2022-12-17: qty 2

## 2022-12-17 MED ORDER — SODIUM CHLORIDE 0.9 % IV BOLUS
500.0000 mL | Freq: Once | INTRAVENOUS | Status: AC
  Administered 2022-12-17: 500 mL via INTRAVENOUS

## 2022-12-17 MED ORDER — MORPHINE SULFATE (PF) 4 MG/ML IV SOLN
4.0000 mg | Freq: Once | INTRAVENOUS | Status: AC
  Administered 2022-12-17: 4 mg via INTRAVENOUS
  Filled 2022-12-17: qty 1

## 2022-12-17 MED ORDER — SODIUM CHLORIDE 0.9 % IV SOLN
1.0000 g | Freq: Once | INTRAVENOUS | Status: AC
  Administered 2022-12-17: 1 g via INTRAVENOUS
  Filled 2022-12-17: qty 10

## 2022-12-17 NOTE — ED Triage Notes (Addendum)
Pt arrived POV for left flank pain radiaitng from her left lower back since yesterday, pain increase tonight after eating. Pt reports hematuria that start upon arrival. No hx of kidney stones. NAD A&O x4. HTN noted, pt reports did not take her BP meds today.

## 2022-12-17 NOTE — ED Provider Notes (Incomplete)
[place] EMERGENCY DEPARTMENT AT [facility] Provider Note   CSN: [id number] Arrival date & time: 12/17/22  1918     History {Add pertinent medical, surgical, social history, OB history to HPI:1} Chief Complaint  Patient presents with   Flank Pain    [name] is a 72 y.o. female.  72 year old female presents with complaint of left flank pain onset yesterday, worse today with associated gross hematuria.  Reports suprapubic pressure, pain improved after voiding however is gradually returning.  Denies dysuria, no history of kidney stones (sees [facility], Dr. Sherron Monday).  Reports nausea without vomiting, no fevers or chills. Not anticoagulated.  History of diabetes, asthma, CHF (although not known to patient, not on daily lasix or other meds related), hypertension, hyperlipidemia, pericarditis, bipolar disorder, hypothyroid.       Home Medications Prior to Admission medications   Medication Sig Start Date End Date Taking? Authorizing Provider  acetaminophen (TYLENOL) 325 MG tablet Take 650 mg by mouth every 6 (six) hours as needed for mild pain or headache.    [provider]  amLODipine (NORVASC) 5 MG tablet Take 5 mg by mouth every evening. 01/28/19   [provider]  aspirin 81 MG tablet Take 81 mg by mouth daily.    [provider]  cephALEXin (KEFLEX) 250 MG capsule Take 250 mg by mouth at bedtime. 09/28/19   [provider]  famotidine (PEPCID) 20 MG tablet Take 20 mg by mouth in the morning and at bedtime.    [provider]  furosemide (LASIX) 20 MG tablet Take 20 mg by mouth daily as needed (for severe swelling).    [provider]  glimepiride (AMARYL) 1 MG tablet Take 1 mg by mouth daily. 07/27/20   [provider]  lamoTRIgine (LAMICTAL) 150 MG tablet TAKE 1 TABLET TWICE DAILY 11/13/22   Cottle, Steva Ready., MD  levocetirizine (XYZAL) 5 MG tablet Take 5 mg by mouth every evening.     [provider]  levothyroxine (SYNTHROID) 100 MCG tablet Take 100 mcg by mouth daily before breakfast.    [provider]  lisinopril (PRINIVIL,ZESTRIL) 20 MG tablet Take 20 mg by mouth daily.    [provider]  meloxicam (MOBIC) 15 MG tablet Take 15 mg by mouth daily. 08/27/19   [provider]  metoprolol succinate (TOPROL-XL) 50 MG 24 hr tablet Take 50 mg by mouth every evening. Take with or immediately following a meal.    [provider]  Multiple Vitamin (MULTIVITAMIN) tablet Take 1 tablet by mouth daily.    [provider]  polyethylene glycol (MIRALAX / GLYCOLAX) packet Take 17 g by mouth 2 (two) times daily as needed for mild constipation (MIX AS DIRECTED AND DRINK).    [provider]  potassium chloride SA (K-DUR,KLOR-CON) 20 MEQ tablet Take 20 mEq by mouth daily.    [provider]  QUEtiapine (SEROQUEL XR) 400 MG 24 hr tablet 1 at 730PM 04/28/22   Cottle, Steva Ready., MD  QUEtiapine (SEROQUEL) 100 MG tablet Take 1 tablet (100 mg total) by mouth at bedtime. 04/28/22   Cottle, Steva Ready., MD  QUEtiapine (SEROQUEL) 300 MG tablet TAKE 1 TABLET AT BEDTIME 06/07/22   Cottle, Steva Ready., MD  simvastatin (ZOCOR) 40 MG tablet Take 1 tablet (40 mg total) by mouth every evening. 05/11/21   Cottle, Steva Ready., MD      Allergies    Maxzide [triamterene-hctz], Celexa [citalopram hydrobromide],  Metformin and related, Other, Reglan [metoclopramide], Risperdal [risperidone], Trazodone and nefazodone, Zestril [lisinopril], and Lithium    Review of Systems   Review of Systems Negative except as per HPI Physical Exam Updated Vital Signs BP (!) 172/90 (BP Location: Right Arm)   Pulse 85   Temp 98 F (36.7 C) (Oral)   Resp 18   Ht 5\' 5"  (1.651 m)   Wt 124.7 kg   SpO2 100%   BMI 45.76 kg/m  Physical Exam Vitals and nursing note reviewed.  Constitutional:      General: She is not in acute distress.    Appearance: She is  well-developed. She is not diaphoretic.  HENT:     Head: Normocephalic and atraumatic.  Cardiovascular:     Rate and Rhythm: Normal rate and regular rhythm.     Heart sounds: Normal heart sounds.  Pulmonary:     Effort: Pulmonary effort is normal.     Breath sounds: Normal breath sounds.  Abdominal:     Palpations: Abdomen is soft.     Tenderness: There is no abdominal tenderness. There is no right CVA tenderness or left CVA tenderness.  Skin:    General: Skin is warm and dry.     Findings: No erythema or rash.  Neurological:     Mental Status: She is alert and oriented to person, place, and time.  Psychiatric:        Behavior: Behavior normal.     ED Results / Procedures / Treatments   Labs (all labs ordered are listed, but only abnormal results are displayed) Labs Reviewed  BASIC METABOLIC PANEL - Abnormal; Notable for the following components:      Result Value   Glucose, Bld 181 (*)    Calcium 10.5 (*)    All other components within normal limits  URINALYSIS, ROUTINE W REFLEX MICROSCOPIC - Abnormal; Notable for the following components:   APPearance CLOUDY (*)    Hgb urine dipstick LARGE (*)    Protein, ur 100 (*)    Leukocytes,Ua LARGE (*)    Bacteria, UA RARE (*)    Non Squamous Epithelial 0-5 (*)    All other components within normal limits  URINE CULTURE  CBC    EKG None  Radiology No results found.  Procedures Procedures  {Document cardiac monitor, telemetry assessment procedure when appropriate:1}  Medications Ordered in ED Medications  sodium chloride 0.9 % bolus 500 mL (has no administration in time range)  ondansetron (ZOFRAN) injection 4 mg (has no administration in time range)  morphine (PF) 4 MG/ML injection 4 mg (has no administration in time range)  cefTRIAXone (ROCEPHIN) 1 g in sodium chloride 0.9 % 100 mL IVPB (has no administration in time range)    ED Course/ Medical Decision Making/ A&P   {   Click here for ABCD2, HEART and other  calculatorsREFRESH Note before signing :1}                          Medical Decision Making Amount and/or Complexity of Data Reviewed Labs: ordered.  Risk Prescription drug management.   This patient presents to the ED for concern of flank pain, hematuria, this involves an extensive number of treatment options, and is a complaint that carries with it a high risk of complications and morbidity.  The differential diagnosis includes ureterolithiasis, pyelonephritis, UTI   Co morbidities that complicate the patient evaluation  As above   Additional history obtained:  Additional history  obtained from husband at bedside who contributes to history as above External records from outside source obtained and reviewed including ***   Lab Tests:  I Ordered, and personally interpreted labs.  The pertinent results include: CBC within normal limits.  CMP with hyperglycemia, glucose 181, nonfasting, diabetic patient.  Urinalysis is cloudy with large hemoglobin, protein, large leukocytes with greater than 50 red cells and white cells, rare bacteria.  Culture added and sent out.   Imaging Studies ordered:  I ordered imaging studies including CT stone study  I independently visualized and interpreted imaging which showed left ureteral stone I agree with the radiologist interpretation   Cardiac Monitoring: / EKG:  The patient was maintained on a cardiac monitor.  I personally viewed and interpreted the cardiac monitored which showed an underlying rhythm of: ***   Consultations Obtained:  I requested consultation with the urologist, Dr. ***,  and discussed lab and imaging findings as well as pertinent plan - they recommend: ***   Problem List / ED Course / Critical interventions / Medication management  72 year old female with left flank pain and gross hematuria.  On exam, no CVA tenderness, abdomen is soft and nontender. I ordered medication including Zofran, morphine, Rocephin for pain,  nausea, urinary tract infection Reevaluation of the patient after these medicines showed that the patient {resolved/improved/worsened:23923::"improved"} I have reviewed the patients home medicines and have made adjustments as needed   Social Determinants of Health:  Lives with spouse   Test / Admission - Considered:  ***   {Document critical care time when appropriate:1} {Document review of labs and clinical decision tools ie heart score, Chads2Vasc2 etc:1}  {Document your independent review of radiology images, and any outside records:1} {Document your discussion with family members, caretakers, and with consultants:1} {Document social determinants of health affecting pt's care:1} {Document your decision making why or why not admission, treatments were needed:1} Final Clinical Impression(s) / ED Diagnoses Final diagnoses:  None    Rx / DC Orders ED Discharge Orders     None

## 2022-12-18 DIAGNOSIS — N201 Calculus of ureter: Secondary | ICD-10-CM | POA: Diagnosis not present

## 2022-12-18 MED ORDER — HYDROCODONE-ACETAMINOPHEN 5-325 MG PO TABS: 1.0000 | ORAL_TABLET | ORAL | 0 refills | Status: DC | PRN

## 2022-12-18 MED ORDER — TAMSULOSIN HCL 0.4 MG PO CAPS: 0.4000 mg | ORAL_CAPSULE | Freq: Every day | ORAL | 0 refills | Status: DC

## 2022-12-18 MED ORDER — SULFAMETHOXAZOLE-TRIMETHOPRIM 800-160 MG PO TABS: 1.0000 | ORAL_TABLET | Freq: Two times a day (BID) | ORAL | 0 refills | Status: DC

## 2022-12-18 MED ORDER — ONDANSETRON 4 MG PO TBDP: 4.0000 mg | ORAL_TABLET | Freq: Three times a day (TID) | ORAL | 0 refills | Status: DC | PRN

## 2022-12-18 NOTE — Discharge Instructions (Addendum)
Take antibiotics as prescribed. Norco as needed as prescribed for pain.  This medication can cause constipation, take Colace and MiraLAX as needed as directed. Zofran as needed as prescribed for nausea and vomiting.  Return to the ER for fever, pain or vomiting not controlled with medications.  Follow-up with urology, please call on Monday.

## 2022-12-19 DIAGNOSIS — R310 Gross hematuria: Secondary | ICD-10-CM | POA: Diagnosis not present

## 2022-12-19 DIAGNOSIS — N132 Hydronephrosis with renal and ureteral calculous obstruction: Secondary | ICD-10-CM | POA: Diagnosis not present

## 2022-12-19 LAB — URINE CULTURE: Culture: NO GROWTH

## 2022-12-20 ENCOUNTER — Encounter: Payer: Self-pay | Admitting: Psychiatry

## 2022-12-20 ENCOUNTER — Ambulatory Visit (INDEPENDENT_AMBULATORY_CARE_PROVIDER_SITE_OTHER): Payer: Medicare HMO | Admitting: Psychiatry

## 2022-12-20 DIAGNOSIS — F5105 Insomnia due to other mental disorder: Secondary | ICD-10-CM | POA: Diagnosis not present

## 2022-12-20 DIAGNOSIS — F411 Generalized anxiety disorder: Secondary | ICD-10-CM

## 2022-12-20 DIAGNOSIS — F40240 Claustrophobia: Secondary | ICD-10-CM | POA: Diagnosis not present

## 2022-12-20 DIAGNOSIS — F3164 Bipolar disorder, current episode mixed, severe, with psychotic features: Secondary | ICD-10-CM | POA: Diagnosis not present

## 2022-12-20 DIAGNOSIS — F4001 Agoraphobia with panic disorder: Secondary | ICD-10-CM | POA: Diagnosis not present

## 2022-12-20 DIAGNOSIS — R7989 Other specified abnormal findings of blood chemistry: Secondary | ICD-10-CM

## 2022-12-20 DIAGNOSIS — G3184 Mild cognitive impairment, so stated: Secondary | ICD-10-CM | POA: Diagnosis not present

## 2022-12-20 MED ORDER — SERTRALINE HCL 50 MG PO TABS
ORAL_TABLET | ORAL | 1 refills | Status: DC
Start: 2022-12-20 — End: 2022-12-22

## 2022-12-20 NOTE — Progress Notes (Signed)
[name] [id number] Mar 30, 1951 72 y.o.   Virtual Visit via Telephone Note  I connected with pt by telephone and verified that I am speaking with the correct person using two identifiers.   I discussed the limitations, risks, security and privacy concerns of performing an evaluation and management service by telephone and the availability of in person appointments. I also discussed with the patient that there may be a patient responsible charge related to this service. The patient expressed understanding and agreed to proceed.  I discussed the assessment and treatment plan with the patient. The patient was provided an opportunity to ask questions and all were answered. The patient agreed with the plan and demonstrated an understanding of the instructions.   The patient was advised to call back or seek an in-person evaluation if the symptoms worsen or if the condition fails to improve as anticipated.  I provided 30 minutes of non-face-to-face time during this encounter. . The patient was located at home and the provider was located office. Session from 300-330p  Subjective:   Patient ID:  [name] is a 72 y.o. (DOB 27-Jul-1951) female.  Chief Complaint:  Chief Complaint  Patient presents with   Follow-up   Depression   Anxiety   Sleeping Problem    Anxiety Symptoms include confusion, decreased concentration, dizziness and nervous/anxious behavior. Patient reports no chest pain, palpitations or suicidal ideas.        [name] presents to the office today for follow-up of anxiety and depression and poor cognition.  At her visit October 12, 2018.  Because of her poor cognition which did seem to get even worse lately with her memory and given a negative unremarkable medical work-up by her primary care doctor the decision was made to change her from Xanax which she has been on for years to lorazepam.  Lorazepam often has less cognitive side effects than does  alprazolam.  She had a lot of difficulty with headaches and insomnia with the transition.  She called several times in that transition.  Her husband reported that her cognition was better after the transition however.  She was satisfied with the Ativan.  There was an attempt to reduce her lamotrigine to 150 mg also in hopes of improving cognition but it was uncertain at the last visit where there she had actually done so.  visit December 2020 without med changes.  She had continued to do cognitively better off alprazolam and on lorazepam.  seen March 2021.   Better than December visit.  Feeling good and less down.  Adjusting time of med helped excessively sleep.  Brief hypomanic episode resolved where she cleaned all night.  Sleep 10 hours . No med changes.  12/23/19 appt.  Noted: More depression, crying and racing thoughts and distressed to the point of wondering if needed the hospital.  Sleep got worse and needed Seroquel IR 200 also bc went 2 days without sleep.  It worked and helped sleep.  Worry a lot and melancholy this week.  Cries over what happens dealing with her daugher.[name] has prostate and kidney cancer again.  Had surgery June 10, 2019 and she's cried a lot.  He's incontinent and very uncomfortable.  He usually has positive attitude.   Worry over his health and how she'll do if something happens to him.  She's afraid of Covid.  Trusting in God usually.  She realizes she  Needs to drive occasionally bc of his health.  [name]'s father had  prostate CA and lived to 22 yo.  Uncle died of it.  Not markedly depressed. Can't walk much dT plantar fascitis. Also stressed over [name] 70 yo not doing school work and causing problems.  Her father [name] is a bad parent and not helpful.  Stressed over [name] and downs too and they have periodic conflict.   More down and anxious.   Guilt tendency.   Patient reports difficulty with sleep initiation or maintenance in part DT anxiety and racing  thoughts.Intermittent sleep problems with days and nights mixed up.  Not napping. Not manic.[name] Denies appetite disturbance.  Patient reports that energy and motivation have been good.  Patient has some difficulty with concentration.  Chronic memory problems.   Patient denies any suicidal ideation. Plan:   No changes in meds indicated except agree to take the extra Seroquel 200 IR HS for mixed manic sx that are worse right now.  May be having mixed seasonal sx.  02/20/2020 appointment with the following noted: Doing fairly good except chronic worry over [name] and Yolanda [name].  Some days cries a lot over it.  [name] having a hard time lately.  Still easily stressed but not more than usual. Stopped extra quetiapine 200 mg HS bc no longer needed it. Anxiety is chronic but at baseline as is depression and not manic now. Yolanda helps her take the meds.   Memory is still bad but has been worse when on Xanax instead fo Ativan.  Sleep is fine from 10 to 9 which is much better than in the past.  Occ spells of staying up for 2 days and then needs to take quetiapine.   [name] goes for FU soon for cancer check up. Plan: no med changes  05/21/20 appt with following noted: "A lot of problems".  Can't drive bc fear and anxiety.  Needed to drive Yolanda and couldn't. Fall off toilet twice. Dizzy and HA 2 days ago with a lot of crying and stress with daughter. Leaving home even coming here causes anxiety.    Cataract surgery twice. Fear of [name] being diagnosed with recurrent cancer. Chronic anxiety greather than depression.   Don't know what to do with daughter. Sleep, appetite and energy are OK. Tolerating meds. Sees [name] one day weekly. Yolanda helps with meds bc she's forgetful and easily confused.  Loses train of thoughts. Dog has separation anxiety and so they don't go out much.  Plan: no med changes  08/18/20 appt with following noted: 2 ER visitis with vertigo.  RX diazepam and meclizine. Rarely takes former but takes latter  regularly. Shakes so bad it made her cry and scream.  Started PT. Continues lorazepam low dose 0.5 mg AM and 1.0 mg HS.  Still has dizziness and vertigo. Memory is still bad and Yolanda administers meds with pill box. Chronic anxiety.  Depression manageable. Sleep OK and tolerates meds otherwise. [name] driving her crazy. Plan: no med changes  11/17/20 appt with following noted: Has had periods of vertigo and meds for it.  Then had manic sx with cleaning and couldn't sleep for 24 hours. Stopped quetiapine 200 and continued quetiapine XR 800. Also started diazepam 5 mg BID for vertigo and continued lorazepam for anxiety.  No meclizine now.  Vertigo stopped and manic sx stopped.  Tolerating meds now. Still feels [name] is mean to her too often and she will cry over it. Mandy's ExH [name] in jail and is the father of Mandy's [name] [name] 68 yo.  Will be there for 3 mos. Not markedly depressed.  But hates herself some days.  Still memory problems about the same.  Sleeping OK now.  Periods of mood swings. Easily anxious with relatives visiting.   Plan: Mixed manic sx resolved for now. No med changes today.  02/17/2021 appointment with the following noted: Not good.  "A whole lof of everything."  Shaking for mos. It comes and goes.  Shaking at times makes her think she'll have a nervous breakdown.   Went to ER and dx vertigo.  Has tried meclizine and Valium but scared to take it.  Big shakes again today.  Memory is not good. Sleep to escape anxiety.  Scared to leave home. Not sure what brings on the spells of shaking. Today sx hit her as soon as she awoke.  Plan: No med changes  03/12/2021 phone call: Patient with a lot of anxiety.  Husband reported the patient had been hallucinating and was paranoid that he had left her.  Some confusion over identity of people that were around her.  It was suggested that she have medical work-up because it sounded like she had delirium and may have a UTI causing  that. 03/17/2021 psychiatric hospitalization. 03/22/2021 MD response:  Note Patient has been very unstable with bipolar disorder mixed symptoms lately also with some delirium of unknown cause.  She will be seeing her primary care physician tomorrow.  Due to the severity of her symptoms it is suggested that she add haloperidol 2 mg nightly to her current quetiapine 800 mg nightly since we cannot increase the quetiapine dose further.  This may help with her confusion and agitation.  It is okay to talk with her husband because he administers her medication and because she is too confused to understand the instructions.     04/16/21 Yolanda called with following:  Pt.'s husband called.  She is currently in [facility] waiting for an inpatient pyschiatric bed.  Pt advised her husband that Dr Clovis Pu told her to stop taking the Lorazepam.  He said he is not aware of that chang in her prescription.  He would like someone to call him and advise if there were any changes to her meds so he stays aware and can let the hospital know if needed. Husband was informed we had not made any medication changes and would not have suggested she abruptly stopped lorazepam which could cause withdrawal and other kinds of problems.  05/11/2021 appointment with the following noted:  spoke with Yolanda for info At hospital was taken off Ativan, buspirone, and Seroquel reduced to 500 mg HS (as 1 of XR 400 mg  and 1/2 of 200 mg IR quetiapine).  Had sedative SE at 200 mg queiapinte !R.  Off risperidone and haloperidol.   Hospitalized at [facility] for 9 days.  They didn't feel communication was good with them. But she is better now than she was.  Had been psychotic PTH.  Last 2-3 days getting more hyper and can't wind down and hard time going to sleep.  Came home 05/03/21. She didn't like the restrictions at the hospital.  No panic lately and wants something to help her sleep. Plan; Mixed manic sx including psychosis recently but not  psychotic now but insomnia.. Will get more psychotic if she doesn't sleep but apparently didn't tolerate Seroquel IR 200 mg HS well so will increase to quetiapine IR 150 mg HS and XR 400 mg nightly. Continue lamotrigine.    06/11/21  appt:  [name]. Yolanda thinks she's doing goood. Doing very fine.  No trouble sleeping.  Sometimes in morning feels sad bc she knows she will have racing thoughts, but it resolves after a little while.  Racing thoughts since hospital.  Worries over money chronically. To bed 1015-7:30.  Maybe longer which is normal. Yolanda administers meds.  Some wordfinding problems.  Does journal which helps. No SE with meds.  Yesterday was hard with anxiety but today is better.  On lamotrigine 150 BID, SERoquel XR 400 + Seroquel IR 150 mg HS. Plan: Much better but not fully resolved.  Mixed manic sx including psychosis recently but not psychotic but insomnia last visit resolved with the following... apparently didn't tolerate Seroquel IR 200 mg HS well so increased quetiapine IR 150 mg HS and XR 400 mg nightly and now she is sleeping but still having some racing thoughts. Continue lamotrigine 150 BID.    07/14/2021 appointment with the following noted: Bad day yesterday feeling very depressed without anxiety. Not sleeping well lately.  Thinks it's related to less Seroquel than in the past.  Some EMA.  Always normal slept a lot. Compulsive cleaning and everything in it's place.  Arranges soap dispensers. Cleaning in the middle of the night. No recent psychotic sx but still racing thoughts and chronic $ worries. Still afraid to leave home.  09/20/2021 appt noted: Great overall.  Occ insomnia with EMA.  Still worry but tries not let it consume me.  Still agoraphobia and hates to go out to dentists and doctors.  Challenge to go to dentist.  Can still dread things. Brief nap in daytimes. No SE Yolanda administers meds. Seroquel 300 mg HS, XR 400 mg HS.  Lamotrigine 150 Patient reports [name] mood  and denies depressed or irritable moods.   Patient denies difficulty with sleep initiation or maintenance. Denies appetite disturbance.  Patient reports that energy and motivation have been good.  Patient denies any difficulty with concentration.  Patient denies any suicidal ideation.  01/20/22 appt noted: also talked with [name] Doesn't like waiting 4 mos between appts. Was manic before increasing Seroquel with irritability, not sleeping a couple of days and excessive cleaning. Seroquel increased to IR 400 mg HS.  No SE I feel much Better now.  Doing better at time with anxiety.   Chronic worry over [name] and Yolanda. Yolanda with psych problems. Sleeping good again. Racing thoughts are better but not gone Tolerating meds.   No further changes desired.   Plan: Seroquel IR 400 mg HS well and XR 400 mg nightly abc recent manic sx better with increase dose. Continue lamotrigine 150 mg twice daily  04/28/2022 appointment noted: Trouble sleeping without extra 100 mg quetiapine HS and takes XR 400 mg and IR 300 mg at 730 pm.  Otherwise will be up until 3 AM.  Sleep from 11 until 830.  Good unless anxious. Doing fairly well except 2 weeks bad spell from July 31 related to [place] receiving call from woman he worked with 8 years ago and triggered fear ans suspiciousness and insecurity and overwhelmed her.  She cried and over reacted.  Rough 2 weeks but otherwise ok. Real worried about [name] who is self cutting.  Seeing therapist and psychiatrist.  Her father is terrible and inattentive.  She is 70 yo and never happy. I still have agoraphobia and doesn't want to go ut places.  Her dog also has severe anxiety and doesn't like to be alone too.  Still fearul  about money.  08/03/22 appt noted: Doing pretty well overall.  Chronic worry over money.  Met old friend on Facebook.  She would contact Marylu Lund a lot.  Was making her sad.  Would cry every time the woman called.  ,This was wearing her out bc woman was hyperverbal.  Cut the  woman off.  Since stopped talking to her then no longer crying. When starts to get dark then racing thoughts.  Driving me crazy.  Chronic worry .  Chronic memorhy problems and tendency to be obsessed over fear of dementia. Irritable and confused.  Might nap 30 min and rest her mind and awakens feeling better. Continues the same meds: lamotrigine and quetiapine. Gained 20# last year. Plan: Have pushed Seroquel  as far as possible so start another mood stabilizer Trileptal and increase to 10 mg AM and 300 mg pM  08/09/2022 phone call: She took oxcarbazepine 150 mg Friday and Saturday and developed severe abdominal pain, bowel and bladder incontinence, and impaired vision.  She skipped it on Sunday and symptoms resolved.  She took it again yesterday and the side effects returned.  States it did help with the racing thoughts but she is unable to tolerate it.  That was just on half of oxcarbazepine tablet. MD response: DC oxcarbazepine due to side effects.  10/17/22 appt noted: Devoria Glassing today. Still racing thoughts and cleaning a lot.  More anxiety than depression.  Rough road.  Worries about everything.   Thoughts jump from one thing to another.  Can't get away from it.  Can't focus.  Hard to take instruction bc of this.    12/20/22 appt noted: Not good.  Kidney stone. More anxiety last couple of mos. Get something on her mind and she cannot get it off.  Obsesses on things that don't matter.  Trouble going to sleep.  Couldn't go into store the other day dT panic and agoraphobia.   Occ dep but nothing to worry about.  More panic.   Lost 15 # on [place].     Prior psychiatric medication trials include  paroxetine, Lexapro,  risperidone 4 mg which was sedating, aripiprazole,  perphenazine,  Seroquel 1000, olanzapine for anxiety, lithium which was not tolerated even at very low dosages.  Topamax for anxiety, Oxcarbazepine 150 mg bowel and bladder incontinence and impaired vision. She was  intolerant of both Namenda and Aricept.   Lamotrigine 300.   Xanax, Ativan, diazepam 5 mg BID   This is not an exhaustive list.   Psych hospitalization 04/2021 at [facility] for psychosis.  At the visit in February 2020 Zanayah was more acutely confused recently for no clear reason.   She had a negative medical work-up for causes of short-term memory problems and confusion.  Therefore the decision was made to try switching her from alprazolam to lorazepam at the lowest possible dose in hopes of improving her cognition.  This was successful and her cognition was improved significantly and her husband verified this.     Review of Systems:  Review of Systems  Eyes:  Positive for visual disturbance.  Cardiovascular:  Negative for chest pain and palpitations.  Musculoskeletal:  Positive for arthralgias, back pain and gait problem.  Neurological:  Positive for dizziness. Negative for weakness.  Psychiatric/Behavioral:  Positive for confusion, decreased concentration and sleep disturbance. Negative for agitation, behavioral problems, dysphoric mood, hallucinations, self-injury and suicidal ideas. The patient is nervous/anxious. The patient is not hyperactive.     Medications: I have reviewed the  patient's current medications.  Current Outpatient Medications  Medication Sig Dispense Refill   acetaminophen (TYLENOL) 325 MG tablet Take 650 mg by mouth every 6 (six) hours as needed for mild pain or headache.     amLODipine (NORVASC) 5 MG tablet Take 5 mg by mouth every evening.     aspirin 81 MG tablet Take 81 mg by mouth daily.     cephALEXin (KEFLEX) 250 MG capsule Take 250 mg by mouth at bedtime.     famotidine (PEPCID) 20 MG tablet Take 20 mg by mouth in the morning and at bedtime.     furosemide (LASIX) 20 MG tablet Take 20 mg by mouth daily as needed (for severe swelling).     HYDROcodone-acetaminophen (NORCO/VICODIN) 5-325 MG tablet Take 1 tablet by mouth every 4 (four) hours as needed.  10 tablet 0   lamoTRIgine (LAMICTAL) 150 MG tablet TAKE 1 TABLET TWICE DAILY 180 tablet 3   levocetirizine (XYZAL) 5 MG tablet Take 5 mg by mouth every evening.     levothyroxine (SYNTHROID) 100 MCG tablet Take 100 mcg by mouth daily before breakfast.     lisinopril (PRINIVIL,ZESTRIL) 20 MG tablet Take 20 mg by mouth daily.     meloxicam (MOBIC) 15 MG tablet Take 15 mg by mouth daily.     metoprolol succinate (TOPROL-XL) 50 MG 24 hr tablet Take 50 mg by mouth every evening. Take with or immediately following a meal.     Multiple Vitamin (MULTIVITAMIN) tablet Take 1 tablet by mouth daily.     ondansetron (ZOFRAN-ODT) 4 MG disintegrating tablet Take 1 tablet (4 mg total) by mouth every 8 (eight) hours as needed for nausea or vomiting. 12 tablet 0   polyethylene glycol (MIRALAX / GLYCOLAX) packet Take 17 g by mouth 2 (two) times daily as needed for mild constipation (MIX AS DIRECTED AND DRINK).     potassium chloride SA (K-DUR,KLOR-CON) 20 MEQ tablet Take 20 mEq by mouth daily.     QUEtiapine (SEROQUEL XR) 400 MG 24 hr tablet 1 at 730PM 90 tablet 1   QUEtiapine (SEROQUEL) 100 MG tablet Take 1 tablet (100 mg total) by mouth at bedtime. 90 tablet 1   QUEtiapine (SEROQUEL) 300 MG tablet TAKE 1 TABLET AT BEDTIME 90 tablet 10   sertraline (ZOLOFT) 50 MG tablet 1/2 tablet daily for 1 week then 1 daily for panic/anxiety 30 tablet 1   simvastatin (ZOCOR) 40 MG tablet Take 1 tablet (40 mg total) by mouth every evening. 30 tablet 0   sulfamethoxazole-trimethoprim (BACTRIM DS) 800-160 MG tablet Take 1 tablet by mouth 2 (two) times daily for 7 days. 14 tablet 0   tamsulosin (FLOMAX) 0.4 MG CAPS capsule Take 1 capsule (0.4 mg total) by mouth daily. 30 capsule 0   glimepiride (AMARYL) 1 MG tablet Take 1 mg by mouth daily.     No current facility-administered medications for this visit.    Medication Side Effects: None  Allergies:  Allergies  Allergen Reactions   Maxzide [Triamterene-Hctz] Other (See  Comments)    Hurt patient's kidneys   Celexa [Citalopram Hydrobromide] Nausea Only   Metformin And Related Nausea And Vomiting   Other Nausea And Vomiting and Other (See Comments)    Unnamed medication hurt the patient's kidneys   Reglan [Metoclopramide] Nausea Only   Risperdal [Risperidone] Nausea Only and Other (See Comments)    Also caused hallucinations   Trazodone And Nefazodone Nausea Only   Zestril [Lisinopril] Nausea Only   Lithium Palpitations and Other (  See Comments)    Shakes and delusional pt started seeing things      Past Medical History:  Diagnosis Date   Anxiety    Arthritis    Asthma    Bipolar 1 disorder (HCC)    CHF (congestive heart failure) (HCC)    Complication of anesthesia    left a bad taste in my mouth it has lasted since january    Depression    Diabetes mellitus    Hernia    umbilical   Hyperlipidemia    Hypertension    Hypothyroidism    Pericarditis, viral 2010 October   Sleep apnea    uses cpap setting of 15    Family History  Problem Relation Age of Onset   Diabetes Mother    Hypertension Mother    Hyperlipidemia Mother    Diabetes Father    Hypertension Father    Hyperlipidemia Father    Hypertension Sister     Social History   Socioeconomic History   Marital status: Married    Spouse name: Not on file   Number of children: Not on file   Years of education: Not on file   Highest education level: Not on file  Occupational History   Not on file  Tobacco Use   Smoking status: Never   Smokeless tobacco: Never  Substance and Sexual Activity   Alcohol use: No   Drug use: No   Sexual activity: Not on file  Other Topics Concern   Not on file  Social History Narrative   Not on file   Social Determinants of Health   Financial Resource Strain: Not on file  Food Insecurity: Not on file  Transportation Needs: Not on file  Physical Activity: Not on file  Stress: Not on file  Social Connections: Not on file  Intimate  Partner Violence: Not on file    Past Medical History, Surgical history, Social history, and Family history were reviewed and updated as appropriate.   Please see review of systems for further details on the patient's review from today.   Objective:   Physical Exam:  There were no vitals taken for this visit.  Physical Exam Neurological:     Mental Status: She is alert and oriented to person, place, and time.     Cranial Nerves: No dysarthria.  Psychiatric:        Attention and Perception: Perception normal. She is inattentive.        Mood and Affect: Mood is anxious. Mood is not depressed. Affect is not labile or tearful.        Speech: Speech is not rapid and pressured or slurred.        Behavior: Behavior is cooperative.        Thought Content: Thought content normal. Thought content is not paranoid or delusional. Thought content does not include homicidal or suicidal ideation. Thought content does not include suicidal plan.        Cognition and Memory: Cognition normal. She exhibits impaired recent memory.        Judgment: Judgment normal.     Comments: Insight intact     Lab Review:     Component Value Date/Time   NA 139 12/17/2022 1953   K 3.8 12/17/2022 1953   CL 104 12/17/2022 1953   CO2 23 12/17/2022 1953   GLUCOSE 181 (Yolanda) 12/17/2022 1953   GLUCOSE 154 (Yolanda) 09/01/2006 1100   BUN 18 12/17/2022 1953   CREATININE 1.00 12/17/2022 1953  CREATININE 0.93 07/19/2011 1110   CALCIUM 10.5 (Yolanda) 12/17/2022 1953   PROT 7.5 04/15/2021 1831   ALBUMIN 4.2 04/15/2021 1831   AST 26 04/15/2021 1831   ALT 21 04/15/2021 1831   ALKPHOS 104 04/15/2021 1831   BILITOT 0.7 04/15/2021 1831   GFRNONAA >60 12/17/2022 1953   GFRAA >60 03/23/2016 1933       Component Value Date/Time   WBC 6.8 12/17/2022 1953   RBC 4.61 12/17/2022 1953   HGB 14.4 12/17/2022 1953   HCT 42.7 12/17/2022 1953   PLT 222 12/17/2022 1953   MCV 92.6 12/17/2022 1953   MCH 31.2 12/17/2022 1953   MCHC 33.7  12/17/2022 1953   RDW 13.6 12/17/2022 1953   LYMPHSABS 1.2 04/15/2021 1831   MONOABS 0.4 04/15/2021 1831   EOSABS 0.1 04/15/2021 1831   BASOSABS 0.1 04/15/2021 1831   Prior lamotrigine level in 2018 on this dosage was 3.6.  Not particularly high.  Per Dr. Delaine Lame: Clinical Impressions: Cognitive complaints are likely due to underlying psychiatric disorder (bipolar disorder/anxiety disorder with racing thoughts). Diagnostic impressions based on test performances are limited due to the patient's severe inability to fully attend to the tasks. However, based on clinical presentation, I highly doubt the presence of a neurodegenerative dementia. It is much more likely that her racing thoughts and psychiatric disorders are interfering with cognitive   No results found for: "POCLITH", "LITHIUM"   No results found for: "PHENYTOIN", "PHENOBARB", "VALPROATE", "CBMZ"   .res Assessment: Plan:    Bipolar affective disorder, mixed, severe, with psychotic behavior (HCC)  Generalized anxiety disorder - Plan: sertraline (ZOLOFT) 50 MG tablet  Panic disorder with agoraphobia - Plan: sertraline (ZOLOFT) 50 MG tablet  Claustrophobia  Mild cognitive impairment  Insomnia due to mental condition  Low vitamin [name] level   Liborio Nixon has chronic severe anxiety with panic attacks as well as bipolar disorder with a history of significant lability.  She has had more severe instability with psychotic sx and cognitive problems off and on lately.  She remains chronically anxious and that has been worse lately.   Psych hospitalization 04/2021 at [facility] for psychosis. She also has mild cognitive impairment as a complicating factor.  She needs her husband's assistance to manage her medications.    She is having more panic attacks and her agoraphobia is worse.  She is not wanting to leave the house.  She could not get out of the car to go into a store with her husband the other day because of intense fear. We  discussed the options for treating the anxiety and panic.  First line choice is generally SSRIs but they have a risk of causing mood cycling.  We just recently got her off benzodiazepines so we will be ideal not to restart those if it can be avoided because of their cognitive side effects in her case.  Therefore we will try a low-dose sertraline.  She was informed about the risk of mania and other side effects. Start sertraline 25 mg every morning x 1 week then 50 mg daily.  Continue Seroquel IR 400 mg HS well and XR 400 mg nightly previous manic sx better with increase dose. Trouble sleeping without extra 100 mg quetiapine HS and takes XR 400 mg and IR 300 mg at 730 pm.   Have pushed Seroquel  as far as possible so consider retrying oxcarb at very low dose 75 mg, but defer.  Continue lamotrigine 150 BID.    Consider switch to  Caplyta or Saphris bc lower SE and may help anxiety but probably would not be covered by insurance.  Discussed potential metabolic side effects associated with atypical antipsychotics, as well as potential risk for movement side effects. Advised pt to contact office if movement side effects occur.  Disc family's fear of LT SE.  However current meds work and without them sx unmanageable.  Has been able to stop BZ   Pt is chronically needy and requires extended appts DT chronic anxiety and easily stressed.  Supportive and cognitive work are done with the patient on her excessive guilt and now stress of vertigo.  Cannot drive now and wasn't much before.   Supportive therapy dealing with chronic stressors and difficulty with Gdaughter's mental illness..   Pt doesn't feel able to fix her meds by herself. Disc her fears about going out and other phobias and disc behaviour therapy for I. Fight this behaviorally.  FU 2 mos   Meredith Staggers, MD, DFAPA   Please see After Visit Summary for patient specific instructions.  No future appointments.     No orders of the defined  types were placed in this encounter.      -------------------------------

## 2022-12-22 ENCOUNTER — Encounter (HOSPITAL_COMMUNITY): Payer: Self-pay

## 2022-12-22 ENCOUNTER — Emergency Department (HOSPITAL_COMMUNITY): Payer: Medicare HMO

## 2022-12-22 ENCOUNTER — Inpatient Hospital Stay (HOSPITAL_COMMUNITY)
Admission: EM | Admit: 2022-12-22 | Discharge: 2022-12-25 | DRG: 683 | Disposition: A | Discharge status: Home Health | Payer: Medicare HMO | Attending: Internal Medicine | Admitting: Internal Medicine

## 2022-12-22 ENCOUNTER — Other Ambulatory Visit: Payer: Self-pay

## 2022-12-22 DIAGNOSIS — N1330 Unspecified hydronephrosis: Secondary | ICD-10-CM | POA: Diagnosis present

## 2022-12-22 DIAGNOSIS — I110 Hypertensive heart disease with heart failure: Secondary | ICD-10-CM | POA: Diagnosis not present

## 2022-12-22 DIAGNOSIS — R42 Dizziness and giddiness: Secondary | ICD-10-CM | POA: Diagnosis not present

## 2022-12-22 DIAGNOSIS — E66813 Obesity, class 3: Secondary | ICD-10-CM | POA: Diagnosis present

## 2022-12-22 DIAGNOSIS — W19XXXA Unspecified fall, initial encounter: Secondary | ICD-10-CM | POA: Diagnosis not present

## 2022-12-22 DIAGNOSIS — E039 Hypothyroidism, unspecified: Secondary | ICD-10-CM | POA: Diagnosis present

## 2022-12-22 DIAGNOSIS — Z79890 Hormone replacement therapy (postmenopausal): Secondary | ICD-10-CM | POA: Diagnosis not present

## 2022-12-22 DIAGNOSIS — Z791 Long term (current) use of non-steroidal anti-inflammatories (NSAID): Secondary | ICD-10-CM

## 2022-12-22 DIAGNOSIS — Y92009 Unspecified place in unspecified non-institutional (private) residence as the place of occurrence of the external cause: Secondary | ICD-10-CM

## 2022-12-22 DIAGNOSIS — I5032 Chronic diastolic (congestive) heart failure: Secondary | ICD-10-CM | POA: Diagnosis present

## 2022-12-22 DIAGNOSIS — Z7982 Long term (current) use of aspirin: Secondary | ICD-10-CM

## 2022-12-22 DIAGNOSIS — K219 Gastro-esophageal reflux disease without esophagitis: Secondary | ICD-10-CM | POA: Diagnosis present

## 2022-12-22 DIAGNOSIS — Z7984 Long term (current) use of oral hypoglycemic drugs: Secondary | ICD-10-CM | POA: Diagnosis not present

## 2022-12-22 DIAGNOSIS — F319 Bipolar disorder, unspecified: Secondary | ICD-10-CM | POA: Diagnosis not present

## 2022-12-22 DIAGNOSIS — J45909 Unspecified asthma, uncomplicated: Secondary | ICD-10-CM | POA: Diagnosis present

## 2022-12-22 DIAGNOSIS — R9431 Abnormal electrocardiogram [ECG] [EKG]: Secondary | ICD-10-CM | POA: Diagnosis not present

## 2022-12-22 DIAGNOSIS — Z87442 Personal history of urinary calculi: Secondary | ICD-10-CM

## 2022-12-22 DIAGNOSIS — E782 Mixed hyperlipidemia: Secondary | ICD-10-CM | POA: Diagnosis present

## 2022-12-22 DIAGNOSIS — G4733 Obstructive sleep apnea (adult) (pediatric): Secondary | ICD-10-CM | POA: Diagnosis present

## 2022-12-22 DIAGNOSIS — Z90710 Acquired absence of both cervix and uterus: Secondary | ICD-10-CM

## 2022-12-22 DIAGNOSIS — F411 Generalized anxiety disorder: Secondary | ICD-10-CM | POA: Diagnosis present

## 2022-12-22 DIAGNOSIS — Z5329 Procedure and treatment not carried out because of patient's decision for other reasons: Secondary | ICD-10-CM | POA: Diagnosis present

## 2022-12-22 DIAGNOSIS — I10 Essential (primary) hypertension: Secondary | ICD-10-CM | POA: Diagnosis present

## 2022-12-22 DIAGNOSIS — Z6841 Body Mass Index (BMI) 40.0 and over, adult: Secondary | ICD-10-CM

## 2022-12-22 DIAGNOSIS — Z8249 Family history of ischemic heart disease and other diseases of the circulatory system: Secondary | ICD-10-CM

## 2022-12-22 DIAGNOSIS — I480 Paroxysmal atrial fibrillation: Secondary | ICD-10-CM | POA: Diagnosis not present

## 2022-12-22 DIAGNOSIS — Z9049 Acquired absence of other specified parts of digestive tract: Secondary | ICD-10-CM

## 2022-12-22 DIAGNOSIS — Z79899 Other long term (current) drug therapy: Secondary | ICD-10-CM | POA: Diagnosis not present

## 2022-12-22 DIAGNOSIS — Z043 Encounter for examination and observation following other accident: Secondary | ICD-10-CM | POA: Diagnosis not present

## 2022-12-22 DIAGNOSIS — Z833 Family history of diabetes mellitus: Secondary | ICD-10-CM

## 2022-12-22 DIAGNOSIS — N179 Acute kidney failure, unspecified: Secondary | ICD-10-CM | POA: Diagnosis not present

## 2022-12-22 DIAGNOSIS — R55 Syncope and collapse: Secondary | ICD-10-CM | POA: Diagnosis not present

## 2022-12-22 DIAGNOSIS — N132 Hydronephrosis with renal and ureteral calculous obstruction: Secondary | ICD-10-CM | POA: Diagnosis not present

## 2022-12-22 DIAGNOSIS — Z7985 Long-term (current) use of injectable non-insulin antidiabetic drugs: Secondary | ICD-10-CM

## 2022-12-22 DIAGNOSIS — M25561 Pain in right knee: Secondary | ICD-10-CM | POA: Diagnosis present

## 2022-12-22 DIAGNOSIS — E119 Type 2 diabetes mellitus without complications: Secondary | ICD-10-CM | POA: Diagnosis present

## 2022-12-22 DIAGNOSIS — Z83438 Family history of other disorder of lipoprotein metabolism and other lipidemia: Secondary | ICD-10-CM

## 2022-12-22 LAB — HEPATIC FUNCTION PANEL
ALT: 15 U/L (ref 0–44)
AST: 19 U/L (ref 15–41)
Albumin: 3.6 g/dL (ref 3.5–5.0)
Alkaline Phosphatase: 76 U/L (ref 38–126)
Bilirubin, Direct: 0.2 mg/dL (ref 0.0–0.2)
Indirect Bilirubin: 0.5 mg/dL (ref 0.3–0.9)
Total Bilirubin: 0.7 mg/dL (ref 0.3–1.2)
Total Protein: 6.8 g/dL (ref 6.5–8.1)

## 2022-12-22 LAB — BASIC METABOLIC PANEL
Anion gap: 11 (ref 5–15)
BUN: 21 mg/dL (ref 8–23)
CO2: 24 mmol/L (ref 22–32)
Calcium: 10.4 mg/dL — ABNORMAL HIGH (ref 8.9–10.3)
Chloride: 101 mmol/L (ref 98–111)
Creatinine, Ser: 1.6 mg/dL — ABNORMAL HIGH (ref 0.44–1.00)
GFR, Estimated: 34 mL/min — ABNORMAL LOW (ref >60)
Glucose, Bld: 136 mg/dL — ABNORMAL HIGH (ref 70–99)
Potassium: 4.1 mmol/L (ref 3.5–5.1)
Sodium: 136 mmol/L (ref 135–145)

## 2022-12-22 LAB — CBC
HCT: 38.8 % (ref 36.0–46.0)
Hemoglobin: 13.1 g/dL (ref 12.0–15.0)
MCH: 31.4 pg (ref 26.0–34.0)
MCHC: 33.8 g/dL (ref 30.0–36.0)
MCV: 93 fL (ref 80.0–100.0)
Platelets: 204 10*3/uL (ref 150–400)
RBC: 4.17 MIL/uL (ref 3.87–5.11)
RDW: 13.6 % (ref 11.5–15.5)
WBC: 7.4 10*3/uL (ref 4.0–10.5)
nRBC: 0 % (ref 0.0–0.2)

## 2022-12-22 LAB — URINALYSIS, ROUTINE W REFLEX MICROSCOPIC
Bacteria, UA: NONE SEEN
Bilirubin Urine: NEGATIVE
Glucose, UA: NEGATIVE mg/dL
Ketones, ur: NEGATIVE mg/dL
Nitrite: NEGATIVE
Protein, ur: NEGATIVE mg/dL
Specific Gravity, Urine: 1.014 (ref 1.005–1.030)
pH: 6 (ref 5.0–8.0)

## 2022-12-22 LAB — CBG MONITORING, ED: Glucose-Capillary: 122 mg/dL — ABNORMAL HIGH (ref 70–99)

## 2022-12-22 MED ORDER — SIMVASTATIN 40 MG PO TABS
40.0000 mg | ORAL_TABLET | Freq: Every evening | ORAL | Status: DC
  Administered 2022-12-22 – 2022-12-24 (×3): 40 mg via ORAL
  Filled 2022-12-22 (×3): qty 1

## 2022-12-22 MED ORDER — SODIUM CHLORIDE 0.9 % IV SOLN: INTRAVENOUS | Status: AC

## 2022-12-22 MED ORDER — SODIUM CHLORIDE 0.9 % IV SOLN
2.0000 g | INTRAVENOUS | Status: DC
  Administered 2022-12-23 – 2022-12-25 (×3): 2 g via INTRAVENOUS
  Filled 2022-12-22 (×3): qty 20

## 2022-12-22 MED ORDER — TAMSULOSIN HCL 0.4 MG PO CAPS
0.4000 mg | ORAL_CAPSULE | Freq: Every day | ORAL | Status: DC
  Administered 2022-12-22 – 2022-12-25 (×4): 0.4 mg via ORAL
  Filled 2022-12-22 (×4): qty 1

## 2022-12-22 MED ORDER — NALOXONE HCL 0.4 MG/ML IJ SOLN: 0.4000 mg | INTRAMUSCULAR | Status: DC | PRN

## 2022-12-22 MED ORDER — ACETAMINOPHEN 650 MG RE SUPP: 650.0000 mg | Freq: Four times a day (QID) | RECTAL | Status: DC | PRN

## 2022-12-22 MED ORDER — ONDANSETRON HCL 4 MG/2ML IJ SOLN: 4.0000 mg | Freq: Four times a day (QID) | INTRAMUSCULAR | Status: DC | PRN

## 2022-12-22 MED ORDER — ACETAMINOPHEN 325 MG PO TABS
650.0000 mg | ORAL_TABLET | Freq: Four times a day (QID) | ORAL | Status: DC | PRN
  Administered 2022-12-23 – 2022-12-25 (×2): 650 mg via ORAL
  Filled 2022-12-22 (×2): qty 2

## 2022-12-22 MED ORDER — FENTANYL CITRATE PF 50 MCG/ML IJ SOSY
25.0000 ug | PREFILLED_SYRINGE | INTRAMUSCULAR | Status: DC | PRN
  Administered 2022-12-25: 25 ug via INTRAVENOUS
  Filled 2022-12-22: qty 1

## 2022-12-22 MED ORDER — LACTATED RINGERS IV SOLN: INTRAVENOUS | Status: DC

## 2022-12-22 MED ORDER — QUETIAPINE FUMARATE ER 200 MG PO TB24
400.0000 mg | ORAL_TABLET | Freq: Every day | ORAL | Status: DC
  Administered 2022-12-22 – 2022-12-24 (×3): 400 mg via ORAL
  Filled 2022-12-22 (×3): qty 2

## 2022-12-22 MED ORDER — LEVOTHYROXINE SODIUM 100 MCG PO TABS
100.0000 ug | ORAL_TABLET | Freq: Every day | ORAL | Status: DC
  Administered 2022-12-23 – 2022-12-25 (×3): 100 ug via ORAL
  Filled 2022-12-22 (×3): qty 1

## 2022-12-22 MED ORDER — LAMOTRIGINE 25 MG PO TABS
150.0000 mg | ORAL_TABLET | Freq: Two times a day (BID) | ORAL | Status: DC
  Administered 2022-12-22 – 2022-12-25 (×7): 150 mg via ORAL
  Filled 2022-12-22 (×7): qty 2

## 2022-12-22 MED ORDER — SERTRALINE HCL 50 MG PO TABS
50.0000 mg | ORAL_TABLET | Freq: Every day | ORAL | Status: DC
  Administered 2022-12-22 – 2022-12-25 (×4): 50 mg via ORAL
  Filled 2022-12-22 (×4): qty 1

## 2022-12-22 MED ORDER — AMLODIPINE BESYLATE 5 MG PO TABS
5.0000 mg | ORAL_TABLET | Freq: Every evening | ORAL | Status: DC
  Administered 2022-12-22 – 2022-12-24 (×3): 5 mg via ORAL
  Filled 2022-12-22 (×3): qty 1

## 2022-12-22 MED ORDER — METOPROLOL SUCCINATE ER 50 MG PO TB24
50.0000 mg | ORAL_TABLET | Freq: Every evening | ORAL | Status: DC
  Administered 2022-12-22 – 2022-12-24 (×3): 50 mg via ORAL
  Filled 2022-12-22 (×3): qty 1

## 2022-12-22 MED ORDER — SODIUM CHLORIDE 0.9 % IV SOLN
1.0000 g | Freq: Once | INTRAVENOUS | Status: AC
  Administered 2022-12-22: 1 g via INTRAVENOUS
  Filled 2022-12-22: qty 10

## 2022-12-22 MED ORDER — POLYETHYLENE GLYCOL 3350 17 G PO PACK
17.0000 g | PACK | Freq: Two times a day (BID) | ORAL | Status: DC | PRN
  Administered 2022-12-22: 17 g via ORAL
  Filled 2022-12-22: qty 1

## 2022-12-22 MED ORDER — SODIUM CHLORIDE 0.9 % IV BOLUS
1000.0000 mL | Freq: Once | INTRAVENOUS | Status: AC
  Administered 2022-12-22: 1000 mL via INTRAVENOUS

## 2022-12-22 MED ORDER — ACETAMINOPHEN 325 MG PO TABS
650.0000 mg | ORAL_TABLET | Freq: Once | ORAL | Status: AC
  Administered 2022-12-22: 650 mg via ORAL
  Filled 2022-12-22: qty 2

## 2022-12-22 NOTE — Progress Notes (Signed)
  Carryover admission to the Day Admitter.  I discussed this case with the EDP, Christian Prosperi.  Per these discussions:   This is a 72 year old female who was diagnosed 5 days ago with an infected kidney stone and discharged home from the ED on oral antibiotics along with prn Norco, who is now being admitted with acute kidney injury after presenting with fall at home.  Patient is unsure if she lost consciousness or not as a component of this fall.  Husband conveys that the patient has been sleeping more than baseline over the last few days, and taking her prn Norco, and notes that the patient has exhibited significant decline in oral intake over the last few days.  She is used to complain of right flank discomfort.  Fall was unwitnessed.  No chest pain.  Presenting labs notable for interval development of acute kidney injury.   She was a dose of Rocephin in the ED today.  I have placed an order for inpatient admission to med/tele for further evaluation management of the above.  I have placed some additional preliminary admit orders via the adult multi-morbid admission order set. I have also ordered n.p.o. for now leading up to determination of need for urology consultation.  Also ordered continuous lactated lactated Ringer's, will defer additional decision making regarding antibiotic selection to the daytime admitting hospitalist.  Also ordered prn IV fentanyl as well as prn Zofran.    Newton Pigg, DO Hospitalist

## 2022-12-22 NOTE — Care Management Obs Status (Signed)
MEDICARE OBSERVATION STATUS NOTIFICATION   Patient Details  Name: [name] MRN: [id number] Date of Birth: June 12, 1951   Medicare Observation Status Notification Given:  Yes    Otelia Santee, LCSW 12/22/2022, 4:20 PM

## 2022-12-22 NOTE — ED Provider Notes (Signed)
[place] EMERGENCY DEPARTMENT AT [facility] Provider Note   CSN: [id number] Arrival date & time: 12/22/22  0230     History  Chief Complaint  Patient presents with   Fall   Knee Pain    [name] is a 72 y.o. female with past medical history significant for hyperlipidemia, hypertension, A-fib, bipolar, acid reflux who presents with concern for fall after vertigo/syncopal episode at home.  Patient reports that she was standing, suddenly felt herself start to lose consciousness and fell to onto the right flank and right knee.  Patient is supposed to use a walker but reports that she has not been using it.  She reports increasing mobility issues lately.  She did not take anything for pain prior to arrival.  She does have known kidney stone diagnosed last week, she was prescribed hydrocodone at the time, patient reports that the hydrocodone may have been contributing to her falls.  Patient denies any chest pain, shortness of breath, persistent dizziness prior to falling.   Fall  Knee Pain      Home Medications Prior to Admission medications   Medication Sig Start Date End Date Taking? Authorizing Provider  acetaminophen (TYLENOL) 325 MG tablet Take 650 mg by mouth every 6 (six) hours as needed for mild pain or headache.    [provider]  amLODipine (NORVASC) 5 MG tablet Take 5 mg by mouth every evening. 01/28/19   [provider]  aspirin 81 MG tablet Take 81 mg by mouth daily.    [provider]  cephALEXin (KEFLEX) 250 MG capsule Take 250 mg by mouth at bedtime. 09/28/19   [provider]  famotidine (PEPCID) 20 MG tablet Take 20 mg by mouth in the morning and at bedtime.    [provider]  furosemide (LASIX) 20 MG tablet Take 20 mg by mouth daily as needed (for severe swelling).    [provider]  glimepiride (AMARYL) 1 MG tablet Take 1 mg by mouth daily. 07/27/20   [provider]   HYDROcodone-acetaminophen (NORCO/VICODIN) 5-325 MG tablet Take 1 tablet by mouth every 4 (four) hours as needed. 12/18/22   Jeannie Fend, PA-C  lamoTRIgine (LAMICTAL) 150 MG tablet TAKE 1 TABLET TWICE DAILY 11/13/22   Cottle, Steva Ready., MD  levocetirizine (XYZAL) 5 MG tablet Take 5 mg by mouth every evening.    [provider]  levothyroxine (SYNTHROID) 100 MCG tablet Take 100 mcg by mouth daily before breakfast.    [provider]  lisinopril (PRINIVIL,ZESTRIL) 20 MG tablet Take 20 mg by mouth daily.    [provider]  meloxicam (MOBIC) 15 MG tablet Take 15 mg by mouth daily. 08/27/19   [provider]  metoprolol succinate (TOPROL-XL) 50 MG 24 hr tablet Take 50 mg by mouth every evening. Take with or immediately following a meal.    [provider]  Multiple Vitamin (MULTIVITAMIN) tablet Take 1 tablet by mouth daily.    [provider]  ondansetron (ZOFRAN-ODT) 4 MG disintegrating tablet Take 1 tablet (4 mg total) by mouth every 8 (eight) hours as needed for nausea or vomiting. 12/18/22   Army Melia A, PA-C  polyethylene glycol (MIRALAX / GLYCOLAX) packet Take 17 g by mouth 2 (two) times daily as needed for mild constipation (MIX AS DIRECTED AND DRINK).    [provider]  potassium chloride SA (K-DUR,KLOR-CON) 20 MEQ tablet Take 20 mEq by mouth daily.    [provider]  QUEtiapine (SEROQUEL XR) 400 MG 24 hr tablet 1 at 730PM 04/28/22   Cottle, Steva Ready., MD  QUEtiapine (SEROQUEL) 100 MG tablet Take 1 tablet (100 mg total) by mouth at bedtime. 04/28/22   Cottle, Steva Ready., MD  QUEtiapine (SEROQUEL) 300 MG tablet TAKE 1 TABLET AT BEDTIME 06/07/22   Cottle, Steva Ready., MD  sertraline (ZOLOFT) 50 MG tablet 1/2 tablet daily for 1 week then 1 daily for panic/anxiety 12/20/22   Cottle, Steva Ready., MD  simvastatin (ZOCOR) 40 MG tablet Take 1 tablet (40 mg total) by mouth every evening. 05/11/21   Cottle, Steva Ready., MD   sulfamethoxazole-trimethoprim (BACTRIM DS) 800-160 MG tablet Take 1 tablet by mouth 2 (two) times daily for 7 days. 12/18/22 12/25/22  Jeannie Fend, PA-C  tamsulosin (FLOMAX) 0.4 MG CAPS capsule Take 1 capsule (0.4 mg total) by mouth daily. 12/18/22   Jeannie Fend, PA-C      Allergies    Maxzide [triamterene-hctz], Celexa [citalopram hydrobromide], Metformin and related, Other, Reglan [metoclopramide], Risperdal [risperidone], Trazodone and nefazodone, Zestril [lisinopril], and Lithium    Review of Systems   Review of Systems  All other systems reviewed and are negative.   Physical Exam Updated Vital Signs BP (!) 156/87 (BP Location: Right Arm)   Pulse 97   Temp 98.1 F (36.7 C)   Resp 15   Ht 5\' 5"  (1.651 m)   Wt 124.7 kg   SpO2 94%   BMI 45.75 kg/m  Physical Exam Vitals and nursing note reviewed.  Constitutional:      General: She is not in acute distress.    Appearance: Normal appearance.  HENT:     Head: Normocephalic and atraumatic.  Eyes:     General:        Right eye: No discharge.        Left eye: No discharge.  Cardiovascular:     Rate and Rhythm: Normal rate and regular rhythm.     Heart sounds: No murmur heard.    No friction rub. No gallop.  Pulmonary:     Effort: Pulmonary effort is normal.     Breath sounds: Normal breath sounds.  Abdominal:     General: Bowel sounds are normal.     Palpations: Abdomen is soft.  Musculoskeletal:     Comments: Some tenderness palpation of the right flank, right knee with normal flexion, extension, some tenderness along the medial compartment.  No palpable stepoff, deformity, effusion on my exam  Skin:    General: Skin is warm and dry.     Capillary Refill: Capillary refill takes less than 2 seconds.  Neurological:     Mental Status: She is alert and oriented to person, place, and time.     Comments: Cranial nerves II through XII grossly intact.  Intact finger-nose, intact heel-to-shin.  Romberg negative, gait  normal.  Alert and oriented x3.  Moves all 4 limbs spontaneously, normal coordination.  No pronator drift.  Intact strength 5 out of 5 bilateral upper and lower extremities.    Psychiatric:        Mood and Affect: Mood normal.        Behavior: Behavior normal.     ED Results / Procedures / Treatments   Labs (all labs ordered are listed, but only abnormal results are displayed) Labs Reviewed  BASIC METABOLIC PANEL - Abnormal; Notable for the following components:      Result Value   Glucose, Bld 136 (*)  Creatinine, Ser 1.60 (*)    Calcium 10.4 (*)    GFR, Estimated 34 (*)    All other components within normal limits  URINALYSIS, ROUTINE W REFLEX MICROSCOPIC - Abnormal; Notable for the following components:   Hgb urine dipstick MODERATE (*)    Leukocytes,Ua LARGE (*)    Non Squamous Epithelial 0-5 (*)    All other components within normal limits  CBG MONITORING, ED - Abnormal; Notable for the following components:   Glucose-Capillary 122 (*)    All other components within normal limits  URINE CULTURE  CBC    EKG EKG Interpretation  Date/Time:  Thursday Dec 22 2022 02:37:32 EDT Ventricular Rate:  98 PR Interval:  195 QRS Duration: 91 QT Interval:  347 QTC Calculation: 443 R Axis:   146 Text Interpretation: Sinus rhythm Left posterior fascicular block Low voltage, precordial leads Borderline T abnormalities, anterior leads Confirmed by Tanda Rockers ([id number]) on 12/22/2022 4:29:12 AM  Radiology CT Head Wo Contrast  Result Date: 12/22/2022 CLINICAL DATA:  72 year old female with episode of vertigo, fall, syncope/presyncope. EXAM: CT HEAD WITHOUT CONTRAST TECHNIQUE: Contiguous axial images were obtained from the base of the skull through the vertex without intravenous contrast. RADIATION DOSE REDUCTION: This exam was performed according to the departmental dose-optimization program which includes automated exposure control, adjustment of the mA and/or kV according to patient size  and/or use of iterative reconstruction technique. COMPARISON:  Head CT 04/15/2021. FINDINGS: Brain: Cerebral volume is stable, within normal limits for age. No midline shift, ventriculomegaly, mass effect, evidence of mass lesion, intracranial hemorrhage or evidence of cortically based acute infarction. Gray-white matter differentiation is within normal limits throughout the brain. Vascular: No suspicious intracranial vascular hyperdensity. But extensive calcified atherosclerosis at the skull base. Skull: No acute osseous abnormality identified. Hyperostosis of the calvarium. Sinuses/Orbits: Tympanic cavities, Visualized paranasal sinuses and mastoids are clear. Other: Stable and negative orbit and scalp soft tissues. IMPRESSION: No acute intracranial abnormality. Stable and negative for age noncontrast Head CT. Electronically Signed   By: Odessa Fleming M.D.   On: 12/22/2022 05:39   DG Knee Complete 4 Views Right  Result Date: 12/22/2022 CLINICAL DATA:  Fall EXAM: RIGHT KNEE - COMPLETE 4+ VIEW COMPARISON:  None Available. FINDINGS: No definitive fracture is seen. Slight lateral deviation of patella. Mild lateral and patellofemoral degenerative change. Positive for knee effusion. Faint joint space calcifications IMPRESSION: No definitive fracture is seen. Slight lateral deviation of patella with knee effusion. Chondrocalcinosis Electronically Signed   By: Jasmine Pang M.D.   On: 12/22/2022 03:16    Procedures Procedures    Medications Ordered in ED Medications  acetaminophen (TYLENOL) tablet 650 mg (has no administration in time range)    Or  acetaminophen (TYLENOL) suppository 650 mg (has no administration in time range)  lactated ringers infusion (has no administration in time range)  ondansetron (ZOFRAN) injection 4 mg (has no administration in time range)  naloxone [facility]) injection 0.4 mg (has no administration in time range)  fentaNYL (SUBLIMAZE) injection 25 mcg (has no administration in time  range)  sodium chloride 0.9 % bolus 1,000 mL (0 mLs Intravenous Stopped 12/22/22 0549)  acetaminophen (TYLENOL) tablet 650 mg (650 mg Oral Given 12/22/22 0458)  cefTRIAXone (ROCEPHIN) 1 g in sodium chloride 0.9 % 100 mL IVPB (1 g Intravenous New Bag/Given 12/22/22 0612)    ED Course/ Medical Decision Making/ A&P  Medical Decision Making Amount and/or Complexity of Data Reviewed Labs: ordered. Radiology: ordered.   This patient is a 72 y.o. female who presents to the ED for concern of fall, right knee pain, questionable syncope episode, this involves an extensive number of treatment options, and is a complaint that carries with it a high risk of complications and morbidity. The emergent differential diagnosis prior to evaluation includes, but is not limited to,  CVA, ACS, arrhythmia, vasovagal syncope, orthostatic hypotension, sepsis, hypoglycemia, electrolyte disturbance, respiratory failure, symptomatic anemia, dehydration, heat injury, polypharmacy, malignancy, anxiety/panic attack, fracture, dislocation, or other injury of the knee . This is not an exhaustive differential.   Past Medical History / Co-morbidities / Social History: hyperlipidemia, hypertension, A-fib, bipolar, acid reflux  Additional history: Chart reviewed. Pertinent results include: Reviewed lab work, imaging from evaluation around 5 days ago for ureteral calculi, as well as UTI  Physical Exam: Physical exam performed. The pertinent findings include: Patient demented similar to her baseline per husband.  She moves all 4 limbs spontaneously, no focal neurologic deficits noted.  She is some tenderness of the right knee especially along the medial compartment, no significant effusion on my exam.  Normal range of motion with some pain.  Lab Tests: I ordered, and personally interpreted labs.  The pertinent results include: UA with moderate hemoglobin, large leukocytes, 50 white blood cells, no squamous  cells, given that she had kidney stones just 5 days ago with some evidence of UTI at that time, has been taking antibiotics, I do think that she would benefit from IV antibiotics, urine culture as it does not seem that her urinary tract infection is resolving.  Her BMP is notable for significant AKI, creatinine 1.6 from baseline of around 1.  CBC is unremarkable, notably with no leukocytosis.   Imaging Studies: I ordered imaging studies including CT head without contrast, plain film right knee x-ray. I independently visualized and interpreted imaging which showed some patellar deviation without dislocation, small effusion, no acute fracture, dislocation.  CT head is unremarkable. I agree with the radiologist interpretation.   Cardiac Monitoring:  The patient was maintained on a cardiac monitor.  My attending physician Dr. Wallace Cullens viewed and interpreted the cardiac monitored which showed an underlying rhythm of: NSR, borderline T wave abnormalities. I agree with this interpretation.   Medications: I ordered medication including fluid bolus for AKI, Tylenol for pain, Rocephin for UTI.  Consultations Obtained: I requested consultation with the hospitalist, spoke with Dr. Arlean Hopping, and discussed lab and imaging findings as well as pertinent plan - they recommend: Admission for ongoing UTI with known kidney stone, pain seems to be of unclear origin, likely secondary to dehydration, new AKI   Disposition: After consideration of the diagnostic results and the patients response to treatment, I feel that patient would benefit from admission as discussed above .   I discussed this case with my attending physician Dr. Wallace Cullens who cosigned this note including patient's presenting symptoms, physical exam, and planned diagnostics and interventions. Attending physician stated agreement with plan or made changes to plan which were implemented.    Final Clinical Impression(s) / ED Diagnoses Final diagnoses:  AKI (acute  kidney injury) [name])    Rx / DC Orders ED Discharge Orders     None         Olene Floss, PA-C 12/22/22 0653    Sloan Leiter, DO 12/23/22 [phone]

## 2022-12-22 NOTE — ED Notes (Signed)
ED TO INPATIENT HANDOFF REPORT  ED Nurse Name and Phone #: Dorene Sorrow Name/Age/Gender [name] 72 y.o. female Room/Bed: WA14/WA14  Code Status   Code Status: Full Code  Home/SNF/Other Home Patient oriented to: self, place, time, and situation Is this baseline? Yes   Triage Complete: Triage complete  Chief Complaint AKI (acute kidney injury) (HCC) [N17.9]  Triage Note BIB [facility] from home after having a fall. Pt c/o R knee pain. Pt states that she has a hx of vertigo and had a "vertigo moment" and fell down. Pt unsure if she had LOC. Denies hitting her head. Pt was able to get up with assistance and ambulate on scene. Is supposed to use a walker but hasn't been using it. Increase in mobility issues lately. Hx of short term memory loss at baseline. A&Ox4 upon arrival.  Seen last week and diagnosed with kidney stones - prescribed hydrocodone at that time. Pt states that she's been taking hydrocodone about once daily.  EMS vitals: 130/84, HR 90, 96%, RR 18, CBG 110   Allergies Allergies  Allergen Reactions   Maxzide [Triamterene-Hctz] Other (See Comments)    Hurt patient's kidneys   Celexa [Citalopram Hydrobromide] Nausea Only   Metformin And Related Nausea And Vomiting   Other Nausea And Vomiting and Other (See Comments)    Unnamed medication hurt the patient's kidneys   Reglan [Metoclopramide] Nausea Only   Risperdal [Risperidone] Nausea Only and Other (See Comments)    Also caused hallucinations   Trazodone And Nefazodone Nausea Only   Zestril [Lisinopril] Nausea Only   Lithium Palpitations and Other (See Comments)    Shakes and delusional pt started seeing things      Level of Care/Admitting Diagnosis ED Disposition     ED Disposition  Admit   Condition  --   Comment  Hospital Area: [facility] [[id number]]  Level of Care: Telemetry [5]  Admit to tele based on following criteria: Monitor for Ischemic changes  May admit patient to [facility] or [facility] if equivalent level of care is available:: No  Covid Evaluation: Asymptomatic - no recent exposure (last 10 days) testing not required  Diagnosis: AKI (acute kidney injury) [facility]) [[id number]]  Admitting Physician: Angie Fava [[id number]]  Attending Physician: Angie Fava [[id number]]  Certification:: I certify this patient will need inpatient services for at least 2 midnights  Estimated Length of Stay: 2          B Medical/Surgery History Past Medical History:  Diagnosis Date   Anxiety    Arthritis    Asthma    Bipolar 1 disorder (HCC)    CHF (congestive heart failure) (HCC)    Complication of anesthesia    left a bad taste in my mouth it has lasted since january    Depression    Diabetes mellitus    Hernia    umbilical   Hyperlipidemia    Hypertension    Hypothyroidism    Pericarditis, viral 2010 October   Sleep apnea    uses cpap setting of 15   Past Surgical History:  Procedure Laterality Date   ABDOMINAL HYSTERECTOMY     APPENDECTOMY     CHOLECYSTECTOMY  1984   open   COLONOSCOPY WITH PROPOFOL N/A 05/20/2014   Procedure: COLONOSCOPY WITH PROPOFOL;  Surgeon: Shirley Friar, MD;  Location: [facility];  Service: Endoscopy;  Laterality: N/A;   ESOPHAGOGASTRODUODENOSCOPY (EGD) WITH PROPOFOL N/A 05/20/2014   Procedure: ESOPHAGOGASTRODUODENOSCOPY (EGD) WITH  PROPOFOL;  Surgeon: Shirley Friar, MD;  Location: [facility];  Service: Endoscopy;  Laterality: N/A;   FOOT SURGERY     bone spurs    KNEE ARTHROSCOPY  09/07/2012   Procedure: ARTHROSCOPY KNEE;  Surgeon: Nadara Mustard, MD;  Location: [facility] OR;  Service: Orthopedics;  Laterality: Left;  Left Knee Arthroscopy     A IV Location/Drains/Wounds Patient Lines/Drains/Airways Status     Active Line/Drains/Airways     Name Placement date Placement time Site Days   Peripheral IV 12/22/22 22 G Distal;Left;Posterior Forearm 12/22/22  0300  Forearm  less than 1             Intake/Output Last 24 hours  Intake/Output Summary (Last 24 hours) at 12/22/2022 1128 Last data filed at 12/22/2022 0744 Gross per 24 hour  Intake 1100 ml  Output 1 ml  Net 1099 ml    Labs/Imaging Results for orders placed or performed during the hospital encounter of 12/22/22 (from the past 48 hour(s))  POC CBG, ED     Status: Abnormal   Collection Time: 12/22/22  2:51 AM  Result Value Ref Range   Glucose-Capillary 122 (H) 70 - 99 mg/dL    Comment: Glucose reference range applies only to samples taken after fasting for at least 8 hours.  CBC     Status: None   Collection Time: 12/22/22  3:00 AM  Result Value Ref Range   WBC 7.4 4.0 - 10.5 K/uL   RBC 4.17 3.87 - 5.11 MIL/uL   Hemoglobin 13.1 12.0 - 15.0 g/dL   HCT 16.1 09.6 - 04.5 %   MCV 93.0 80.0 - 100.0 fL   MCH 31.4 26.0 - 34.0 pg   MCHC 33.8 30.0 - 36.0 g/dL   RDW 40.9 81.1 - 91.4 %   Platelets 204 150 - 400 K/uL   nRBC 0.0 0.0 - 0.2 %    Comment: Performed at [facility], [address], [place], [place] [address]  Basic metabolic panel     Status: Abnormal   Collection Time: 12/22/22  3:00 AM  Result Value Ref Range   Sodium 136 135 - 145 mmol/L   Potassium 4.1 3.5 - 5.1 mmol/L   Chloride 101 98 - 111 mmol/L   CO2 24 22 - 32 mmol/L   Glucose, Bld 136 (H) 70 - 99 mg/dL    Comment: Glucose reference range applies only to samples taken after fasting for at least 8 hours.   BUN 21 8 - 23 mg/dL   Creatinine, Ser 6.21 (H) 0.44 - 1.00 mg/dL   Calcium 30.8 (H) 8.9 - 10.3 mg/dL   GFR, Estimated 34 (L) >60 mL/min    Comment: (NOTE) Calculated using the CKD-EPI Creatinine Equation (2021)    Anion gap 11 5 - 15    Comment: Performed at [facility], [address], [place], [place] [address]  Hepatic function panel     Status: None   Collection Time: 12/22/22  3:00 AM  Result Value Ref Range   Total Protein 6.8 6.5 - 8.1 g/dL   Albumin 3.6 3.5 - 5.0 g/dL   AST 19 15 - 41 U/L    ALT 15 0 - 44 U/L   Alkaline Phosphatase 76 38 - 126 U/L   Total Bilirubin 0.7 0.3 - 1.2 mg/dL   Bilirubin, Direct 0.2 0.0 - 0.2 mg/dL   Indirect Bilirubin 0.5 0.3 - 0.9 mg/dL    Comment: Performed at [facility], [address], [place], [place] [address]  Urinalysis, Routine w reflex microscopic -Urine, Clean Catch     Status: Abnormal   Collection Time: 12/22/22  4:55 AM  Result Value Ref Range   Color, Urine YELLOW YELLOW   APPearance CLEAR CLEAR   Specific Gravity, Urine 1.014 1.005 - 1.030   pH 6.0 5.0 - 8.0   Glucose, UA NEGATIVE NEGATIVE mg/dL   Hgb urine dipstick MODERATE (A) NEGATIVE   Bilirubin Urine NEGATIVE NEGATIVE   Ketones, ur NEGATIVE NEGATIVE mg/dL   Protein, ur NEGATIVE NEGATIVE mg/dL   Nitrite NEGATIVE NEGATIVE   Leukocytes,Ua LARGE (A) NEGATIVE   RBC / HPF 6-10 0 - 5 RBC/hpf   WBC, UA 21-50 0 - 5 WBC/hpf   Bacteria, UA NONE SEEN NONE SEEN   Squamous Epithelial / HPF 0-5 0 - 5 /HPF   Mucus PRESENT    Non Squamous Epithelial 0-5 (A) NONE SEEN    Comment: Performed at [facility], [address], [place], [place] [address]   CT Head Wo Contrast  Result Date: 12/22/2022 CLINICAL DATA:  72 year old female with episode of vertigo, fall, syncope/presyncope. EXAM: CT HEAD WITHOUT CONTRAST TECHNIQUE: Contiguous axial images were obtained from the base of the skull through the vertex without intravenous contrast. RADIATION DOSE REDUCTION: This exam was performed according to the departmental dose-optimization program which includes automated exposure control, adjustment of the mA and/or kV according to patient size and/or use of iterative reconstruction technique. COMPARISON:  Head CT 04/15/2021. FINDINGS: Brain: Cerebral volume is stable, within normal limits for age. No midline shift, ventriculomegaly, mass effect, evidence of mass lesion, intracranial hemorrhage or evidence of cortically based acute infarction. Gray-white matter  differentiation is within normal limits throughout the brain. Vascular: No suspicious intracranial vascular hyperdensity. But extensive calcified atherosclerosis at the skull base. Skull: No acute osseous abnormality identified. Hyperostosis of the calvarium. Sinuses/Orbits: Tympanic cavities, Visualized paranasal sinuses and mastoids are clear. Other: Stable and negative orbit and scalp soft tissues. IMPRESSION: No acute intracranial abnormality. Stable and negative for age noncontrast Head CT. Electronically Signed   By: Odessa Fleming M.D.   On: 12/22/2022 05:39   DG Knee Complete 4 Views Right  Result Date: 12/22/2022 CLINICAL DATA:  Fall EXAM: RIGHT KNEE - COMPLETE 4+ VIEW COMPARISON:  None Available. FINDINGS: No definitive fracture is seen. Slight lateral deviation of patella. Mild lateral and patellofemoral degenerative change. Positive for knee effusion. Faint joint space calcifications IMPRESSION: No definitive fracture is seen. Slight lateral deviation of patella with knee effusion. Chondrocalcinosis Electronically Signed   By: Jasmine Pang M.D.   On: 12/22/2022 03:16    Pending Labs Unresulted Labs (From admission, onward)     Start     Ordered   12/22/22 0981  Urine Culture  Add-on,   AD       Question:  Indication  Answer:  Dysuria   12/22/22 0608            Vitals/Pain Today's Vitals   12/22/22 0800 12/22/22 0930 12/22/22 1000 12/22/22 1030  BP: (!) 160/82 (!) 158/78 101/72 101/72  Pulse: 76 77 74 74  Resp: 16 16  16   Temp:  97.9 F (36.6 C)    TempSrc:  Oral    SpO2: 95% 96% 96% 96%  Weight:      Height:      PainSc:        Isolation Precautions No active isolations  Medications Medications  acetaminophen (TYLENOL) tablet  650 mg (has no administration in time range)    Or  acetaminophen (TYLENOL) suppository 650 mg (has no administration in time range)  ondansetron (ZOFRAN) injection 4 mg (has no administration in time range)  naloxone (NARCAN) injection 0.4 mg  (has no administration in time range)  fentaNYL (SUBLIMAZE) injection 25 mcg (has no administration in time range)  cefTRIAXone (ROCEPHIN) 2 g in sodium chloride 0.9 % 100 mL IVPB (has no administration in time range)  0.9 %  sodium chloride infusion ( Intravenous New Bag/Given 12/22/22 0928)  sodium chloride 0.9 % bolus 1,000 mL (0 mLs Intravenous Stopped 12/22/22 0549)  acetaminophen (TYLENOL) tablet 650 mg (650 mg Oral Given 12/22/22 0458)  cefTRIAXone (ROCEPHIN) 1 g in sodium chloride 0.9 % 100 mL IVPB (0 g Intravenous Stopped 12/22/22 0642)  cefTRIAXone (ROCEPHIN) 1 g in sodium chloride 0.9 % 100 mL IVPB (0 g Intravenous Stopped 12/22/22 1044)    Mobility walks with device     Focused Assessments    R Recommendations: See Admitting Provider Note  Report given to:   Additional Notes:

## 2022-12-22 NOTE — Care Management CC44 (Signed)
Condition Code 44 Documentation Completed  Patient Details  Name: [name] MRN: [id number] Date of Birth: 28-Jul-1951   Condition Code 44 given:  Yes Patient signature on Condition Code 44 notice:  Yes Documentation of 2 MD's agreement:  Yes Code 44 added to claim:  Yes    Otelia Santee, LCSW 12/22/2022, 4:20 PM

## 2022-12-22 NOTE — H&P (Signed)
History and Physical    Patient: [name]:[id number] DOB: August 03, 1951 DOA: 12/22/2022 DOS: the patient was seen and examined on 12/22/2022 PCP: Irven Coe, MD  Patient coming from: Home  Chief Complaint:  Chief Complaint  Patient presents with   Fall   Knee Pain   HPI: [name] is a 72 y.o. female with medical history significant of anxiety, osteoarthritis, asthma, bipolar 1 disorder, chronic diastolic heart failure, depression, type 2 diabetes, umbilical hernia, hyperlipidemia, hypertension, hypothyroidism, viral pericarditis, sleep apnea not on CPAP who was brought to the emergency department after having a fall at home.  The patient believes she passed out briefly, but does not know for how long.  She was seen last week in the emergency department due to nephrolithiasis.  She was discharged on cephalexin, hydrocodone and tamsulosin with instructions to follow-up with urology.  She saw urology earlier this week.  No fever, chills or night sweats. No sore throat, rhinorrhea, dyspnea, wheezing or hemoptysis.  No chest pain, palpitations, diaphoresis, PND, orthopnea, but occasionally has pitting edema of the lower extremities.  No appetite changes, abdominal pain, diarrhea, constipation, melena or hematochezia.  No polyuria, polydipsia, polyphagia or blurred vision.  Lab work: Her urinalysis showed moderate hemoglobinuria, large leukocyte esterase 21-50 WBC, but no bacteria on microscopic examination.  CBC was normal.  BMP showed a glucose of 136, BUN 21, creatinine 1.6 and calcium 10.4 mg/deciliter.  The rest of the electrolytes were normal.  Unremarkable LFTs.  Imaging: CT head without contrast with no acute intracranial normality.  Right knee the x-ray with no fracture seen.  There is slight lateral deviation of the patella with knee effusion.  Chondrocalcinosis.  CT renal study done last week showed mild left hydronephrosis and hydroureter secondary to a 4 mm stone in the proximal  left ureter about 2.5 cm distal to the UPJ.  Colon diverticulosis without diverticulitis.  Aortic atherosclerosis.  ED course: Initial vital signs were temperature 97.7 F, pulse 98, respiration 20, BP 156/87 mmHg O2 sat 98% on room air.  The patient received 2 g of ceftriaxone IVPB, acetaminophen 650 mg p.o. x 1 and 1000 mL of normal saline bolus.  Review of Systems: As mentioned in the history of present illness. All other systems reviewed and are negative. Past Medical History:  Diagnosis Date   Anxiety    Arthritis    Asthma    Bipolar 1 disorder (HCC)    CHF (congestive heart failure) (HCC)    Complication of anesthesia    left a bad taste in my mouth it has lasted since january    Depression    Diabetes mellitus    Hernia    umbilical   Hyperlipidemia    Hypertension    Hypothyroidism    Pericarditis, viral 2010 October   Sleep apnea    uses cpap setting of 15   Past Surgical History:  Procedure Laterality Date   ABDOMINAL HYSTERECTOMY     APPENDECTOMY     CHOLECYSTECTOMY  1984   open   COLONOSCOPY WITH PROPOFOL N/A 05/20/2014   Procedure: COLONOSCOPY WITH PROPOFOL;  Surgeon: Shirley Friar, MD;  Location: [facility];  Service: Endoscopy;  Laterality: N/A;   ESOPHAGOGASTRODUODENOSCOPY (EGD) WITH PROPOFOL N/A 05/20/2014   Procedure: ESOPHAGOGASTRODUODENOSCOPY (EGD) WITH PROPOFOL;  Surgeon: Shirley Friar, MD;  Location: [facility];  Service: Endoscopy;  Laterality: N/A;   FOOT SURGERY     bone spurs    KNEE ARTHROSCOPY  09/07/2012  Procedure: ARTHROSCOPY KNEE;  Surgeon: Nadara Mustard, MD;  [facility];  Service: Orthopedics;  Laterality: Left;  Left Knee Arthroscopy   Social History:  reports that she has never smoked. She has never used smokeless tobacco. She reports that she does not drink alcohol and does not use drugs.  Allergies  Allergen Reactions   Maxzide [Triamterene-Hctz] Other (See Comments)    Hurt patient's kidneys   Celexa [Citalopram  Hydrobromide] Nausea Only   Metformin And Related Nausea And Vomiting   Other Nausea And Vomiting and Other (See Comments)    Unnamed medication hurt the patient's kidneys   Reglan [Metoclopramide] Nausea Only   Risperdal [Risperidone] Nausea Only and Other (See Comments)    Also caused hallucinations   Trazodone And Nefazodone Nausea Only   Zestril [Lisinopril] Nausea Only   Lithium Palpitations and Other (See Comments)    Shakes and delusional pt started seeing things      Family History  Problem Relation Age of Onset   Diabetes Mother    Hypertension Mother    Hyperlipidemia Mother    Diabetes Father    Hypertension Father    Hyperlipidemia Father    Hypertension Sister     Prior to Admission medications   Medication Sig Start Date End Date Taking? Authorizing Provider  acetaminophen (TYLENOL) 325 MG tablet Take 650 mg by mouth every 6 (six) hours as needed for mild pain or headache.    [provider]  amLODipine (NORVASC) 5 MG tablet Take 5 mg by mouth every evening. 01/28/19   [provider]  aspirin 81 MG tablet Take 81 mg by mouth daily.    [provider]  cephALEXin (KEFLEX) 250 MG capsule Take 250 mg by mouth at bedtime. 09/28/19   [provider]  famotidine (PEPCID) 20 MG tablet Take 20 mg by mouth in the morning and at bedtime.    [provider]  furosemide (LASIX) 20 MG tablet Take 20 mg by mouth daily as needed (for severe swelling).    [provider]  glimepiride (AMARYL) 1 MG tablet Take 1 mg by mouth daily. 07/27/20   [provider]  HYDROcodone-acetaminophen (NORCO/VICODIN) 5-325 MG tablet Take 1 tablet by mouth every 4 (four) hours as needed. 12/18/22   Jeannie Fend, PA-C  lamoTRIgine (LAMICTAL) 150 MG tablet TAKE 1 TABLET TWICE DAILY 11/13/22   Cottle, Steva Ready., MD  levocetirizine (XYZAL) 5 MG tablet Take 5 mg by mouth every evening.    [provider]  levothyroxine (SYNTHROID) 100  MCG tablet Take 100 mcg by mouth daily before breakfast.    [provider]  lisinopril (PRINIVIL,ZESTRIL) 20 MG tablet Take 20 mg by mouth daily.    [provider]  meloxicam (MOBIC) 15 MG tablet Take 15 mg by mouth daily. 08/27/19   [provider]  metoprolol succinate (TOPROL-XL) 50 MG 24 hr tablet Take 50 mg by mouth every evening. Take with or immediately following a meal.    [provider]  MOUNJARO 5 MG/0.5ML Pen Inject 5 mg into the skin once a week. 11/14/22   [provider]  Multiple Vitamin (MULTIVITAMIN) tablet Take 1 tablet by mouth daily.    [provider]  ondansetron (ZOFRAN-ODT) 4 MG disintegrating tablet Take 1 tablet (4 mg total) by mouth every 8 (eight) hours as needed for nausea or vomiting. 12/18/22   Army Melia A, PA-C  polyethylene glycol (MIRALAX / GLYCOLAX) packet Take 17 g by  mouth 2 (two) times daily as needed for mild constipation (MIX AS DIRECTED AND DRINK).    [provider]  potassium chloride SA (K-DUR,KLOR-CON) 20 MEQ tablet Take 20 mEq by mouth daily.    [provider]  QUEtiapine (SEROQUEL XR) 400 MG 24 hr tablet 1 at 730PM 04/28/22   Cottle, Steva Ready., MD  QUEtiapine (SEROQUEL) 300 MG tablet TAKE 1 TABLET AT BEDTIME 06/07/22   Cottle, Steva Ready., MD  sertraline (ZOLOFT) 50 MG tablet 1/2 tablet daily for 1 week then 1 daily for panic/anxiety 12/20/22   Cottle, Steva Ready., MD  simvastatin (ZOCOR) 40 MG tablet Take 1 tablet (40 mg total) by mouth every evening. 05/11/21   Cottle, Steva Ready., MD  sulfamethoxazole-trimethoprim (BACTRIM DS) 800-160 MG tablet Take 1 tablet by mouth 2 (two) times daily for 7 days. 12/18/22 12/25/22  Jeannie Fend, PA-C  tamsulosin (FLOMAX) 0.4 MG CAPS capsule Take 1 capsule (0.4 mg total) by mouth daily. 12/18/22   Jeannie Fend, PA-C    Physical Exam: Vitals:   12/22/22 0237 12/22/22 0238 12/22/22 0330 12/22/22 0615  BP: (!) 156/87     Pulse: 98  97    Resp: 20  15   Temp: 97.7 F (36.5 C)   98.1 F (36.7 C)  TempSrc: Oral     SpO2: 98%  94%   Weight:  124.7 kg    Height:  5\' 5"  (1.651 m)     Physical Exam Vitals and nursing note reviewed.  Constitutional:      Appearance: She is obese.  HENT:     Head: Normocephalic.     Nose: No rhinorrhea.     Mouth/Throat:     Mouth: Mucous membranes are moist.  Eyes:     General: No scleral icterus.    Pupils: Pupils are equal, round, and reactive to light.  Cardiovascular:     Rate and Rhythm: Normal rate and regular rhythm.  Pulmonary:     Effort: Pulmonary effort is normal.     Breath sounds: Normal breath sounds.  Abdominal:     General: Bowel sounds are normal. There is no distension.     Palpations: Abdomen is soft.     Tenderness: There is no guarding.  Musculoskeletal:     Cervical back: Neck supple.     Right lower leg: No edema.     Left lower leg: No edema.  Skin:    General: Skin is warm and dry.  Neurological:     General: No focal deficit present.     Mental Status: She is alert and oriented to person, place, and time.  Psychiatric:        Mood and Affect: Mood normal.        Behavior: Behavior normal.     Data Reviewed:  Results are pending, will review when available.  EKG: Vent. rate 98 BPM PR interval 195 ms QRS duration 91 ms QT/QTcB 347/443 ms P-R-T axes 16 146 28 Sinus rhythm Left posterior fascicular block Low voltage, precordial leads Borderline T abnormalities, anterior leads  Assessment and Plan: Principal Problem:   AKI (acute kidney injury) (HCC) Observation/telemetry. Continue IV fluids. Hold ARB/ACE. Hold diuretic. Avoid hypotension. Avoid nephrotoxins. Monitor intake and output. Monitor renal function electrolytes.  Active Problems:   Hydronephrosis of left kidney  Already seen by urology earlier this week. Continue ceftriaxone 2 g IVPB every 24 hours. Follow-up urine culture and sensitivity. Left secure chat and  voicemail message for Dr. Sherron Monday.    Fall Questionable syncopal episode. Observation/telemetry. Continue IV fluids. Hold diuretic. Check carotid Doppler. Check echocardiogram.    Hyperlipemia, mixed Continue simvastatin 40 mg p.o. daily.    Essential hypertension Hold lisinopril and diuretic. Continue amlodipine 5 mg p.o. daily. Continue metoprolol succinate 50 mg p.o. daily.    Paroxysmal atrial fibrillation (HCC) Currently not on anticoagulation. Continue metoprolol for rate control.    OSA on CPAP Declined to use CPAP.    Bipolar 1 disorder, depressed (HCC)   GAD (generalized anxiety disorder) Continue lamotrigine 150 mg p.o. twice daily. Continue quetiapine XR 400 mg in the evening. Follow-up with behavioral health as an outpatient.    GERD (gastroesophageal reflux disease) As needed antiacid, H2 blocker or PPI.    Class 3 obesity (HCC) Lifestyle modifications. Follow-up with primary care provider.      Advance Care Planning:   Code Status: Full Code   Consults:   Family Communication:   Severity of Illness: The appropriate patient status for this patient is INPATIENT. Inpatient status is judged to be reasonable and necessary in order to provide the required intensity of service to ensure the patient's safety. The patient's presenting symptoms, physical exam findings, and initial radiographic and laboratory data in the context of their chronic comorbidities is felt to place them at high risk for further clinical deterioration. Furthermore, it is not anticipated that the patient will be medically stable for discharge from the hospital within 2 midnights of admission.   * I certify that at the point of admission it is my clinical judgment that the patient will require inpatient hospital care spanning beyond 2 midnights from the point of admission due to high intensity of service, high risk for further deterioration and high frequency of surveillance  required.*  Author: Bobette Mo, MD 12/22/2022 7:47 AM  For on call review [email or web address].   This document was prepared using Dragon voice recognition software and may contain some unintended transcription errors.

## 2022-12-22 NOTE — ED Notes (Signed)
Pt assisted to bedside commode

## 2022-12-22 NOTE — ED Triage Notes (Signed)
BIB [facility] from home after having a fall. Pt c/o R knee pain. Pt states that she has a hx of vertigo and had a "vertigo moment" and fell down. Pt unsure if she had LOC. Denies hitting her head. Pt was able to get up with assistance and ambulate on scene. Is supposed to use a walker but hasn't been using it. Increase in mobility issues lately. Hx of short term memory loss at baseline. A&Ox4 upon arrival.  Seen last week and diagnosed with kidney stones - prescribed hydrocodone at that time. Pt states that she's been taking hydrocodone about once daily.  EMS vitals: 130/84, HR 90, 96%, RR 18, CBG 110

## 2022-12-23 ENCOUNTER — Observation Stay (HOSPITAL_COMMUNITY): Payer: Medicare HMO

## 2022-12-23 DIAGNOSIS — R55 Syncope and collapse: Secondary | ICD-10-CM

## 2022-12-23 DIAGNOSIS — Z79890 Hormone replacement therapy (postmenopausal): Secondary | ICD-10-CM | POA: Diagnosis not present

## 2022-12-23 DIAGNOSIS — F319 Bipolar disorder, unspecified: Secondary | ICD-10-CM | POA: Diagnosis not present

## 2022-12-23 DIAGNOSIS — Z6841 Body Mass Index (BMI) 40.0 and over, adult: Secondary | ICD-10-CM | POA: Diagnosis not present

## 2022-12-23 DIAGNOSIS — N132 Hydronephrosis with renal and ureteral calculous obstruction: Secondary | ICD-10-CM | POA: Diagnosis present

## 2022-12-23 DIAGNOSIS — I10 Essential (primary) hypertension: Secondary | ICD-10-CM | POA: Diagnosis not present

## 2022-12-23 DIAGNOSIS — N179 Acute kidney failure, unspecified: Secondary | ICD-10-CM | POA: Diagnosis present

## 2022-12-23 DIAGNOSIS — Z5329 Procedure and treatment not carried out because of patient's decision for other reasons: Secondary | ICD-10-CM | POA: Diagnosis present

## 2022-12-23 DIAGNOSIS — E119 Type 2 diabetes mellitus without complications: Secondary | ICD-10-CM | POA: Diagnosis present

## 2022-12-23 DIAGNOSIS — N1330 Unspecified hydronephrosis: Secondary | ICD-10-CM | POA: Diagnosis not present

## 2022-12-23 DIAGNOSIS — I480 Paroxysmal atrial fibrillation: Secondary | ICD-10-CM | POA: Diagnosis present

## 2022-12-23 DIAGNOSIS — G4733 Obstructive sleep apnea (adult) (pediatric): Secondary | ICD-10-CM | POA: Diagnosis present

## 2022-12-23 DIAGNOSIS — Z7984 Long term (current) use of oral hypoglycemic drugs: Secondary | ICD-10-CM | POA: Diagnosis not present

## 2022-12-23 DIAGNOSIS — I5032 Chronic diastolic (congestive) heart failure: Secondary | ICD-10-CM | POA: Diagnosis present

## 2022-12-23 DIAGNOSIS — J45909 Unspecified asthma, uncomplicated: Secondary | ICD-10-CM | POA: Diagnosis present

## 2022-12-23 DIAGNOSIS — E782 Mixed hyperlipidemia: Secondary | ICD-10-CM | POA: Diagnosis present

## 2022-12-23 DIAGNOSIS — F411 Generalized anxiety disorder: Secondary | ICD-10-CM | POA: Diagnosis present

## 2022-12-23 DIAGNOSIS — E039 Hypothyroidism, unspecified: Secondary | ICD-10-CM | POA: Diagnosis present

## 2022-12-23 DIAGNOSIS — I110 Hypertensive heart disease with heart failure: Secondary | ICD-10-CM | POA: Diagnosis present

## 2022-12-23 DIAGNOSIS — Z90710 Acquired absence of both cervix and uterus: Secondary | ICD-10-CM | POA: Diagnosis not present

## 2022-12-23 DIAGNOSIS — K219 Gastro-esophageal reflux disease without esophagitis: Secondary | ICD-10-CM | POA: Diagnosis present

## 2022-12-23 DIAGNOSIS — Z9049 Acquired absence of other specified parts of digestive tract: Secondary | ICD-10-CM | POA: Diagnosis not present

## 2022-12-23 DIAGNOSIS — Z79899 Other long term (current) drug therapy: Secondary | ICD-10-CM | POA: Diagnosis not present

## 2022-12-23 DIAGNOSIS — Z8249 Family history of ischemic heart disease and other diseases of the circulatory system: Secondary | ICD-10-CM | POA: Diagnosis not present

## 2022-12-23 DIAGNOSIS — Y92009 Unspecified place in unspecified non-institutional (private) residence as the place of occurrence of the external cause: Secondary | ICD-10-CM | POA: Diagnosis not present

## 2022-12-23 DIAGNOSIS — W19XXXA Unspecified fall, initial encounter: Secondary | ICD-10-CM | POA: Diagnosis present

## 2022-12-23 DIAGNOSIS — Z833 Family history of diabetes mellitus: Secondary | ICD-10-CM | POA: Diagnosis not present

## 2022-12-23 LAB — BASIC METABOLIC PANEL
Anion gap: 6 (ref 5–15)
BUN: 14 mg/dL (ref 8–23)
CO2: 25 mmol/L (ref 22–32)
Calcium: 9.9 mg/dL (ref 8.9–10.3)
Chloride: 109 mmol/L (ref 98–111)
Creatinine, Ser: 1.55 mg/dL — ABNORMAL HIGH (ref 0.44–1.00)
GFR, Estimated: 36 mL/min — ABNORMAL LOW (ref >60)
Glucose, Bld: 146 mg/dL — ABNORMAL HIGH (ref 70–99)
Potassium: 4.4 mmol/L (ref 3.5–5.1)
Sodium: 140 mmol/L (ref 135–145)

## 2022-12-23 LAB — URINE CULTURE: Culture: <10000 — AB

## 2022-12-23 LAB — ECHOCARDIOGRAM COMPLETE: Weight: 4359.82 oz

## 2022-12-23 MED ORDER — POLYETHYLENE GLYCOL 3350 17 G PO PACK
17.0000 g | PACK | Freq: Every day | ORAL | Status: DC
  Administered 2022-12-23 – 2022-12-24 (×2): 17 g via ORAL
  Filled 2022-12-23 (×3): qty 1

## 2022-12-23 MED ORDER — SODIUM CHLORIDE 0.9 % IV SOLN: INTRAVENOUS | Status: DC

## 2022-12-23 NOTE — Progress Notes (Addendum)
[facility] PROGRESS NOTE    Progress Note  [name]  ZHY:[id number] DOB: 10-Dec-1950 DOA: 12/22/2022 PCP: Irven Coe, MD     Brief Narrative:   [name] is an 72 y.o. female past medical history significant for anxiety, bipolar disorder, chronic diastolic heart failure, diabetes mellitus type 2, essential hypertension viral pericarditis, was seen last week in the ED for nephrolithiasis discharged on cephalexin and Flomax and told to follow-up with urology brought into the ED after a fall at home, CT of the head showed no acute findings right knee x-ray showed no fracture or dislocation, CT of the renal stones done last week showed left-sided hydronephrosis with hydroureter with a 4 mm stone close to the UPJ in the ED UA was concerning for infection creatinine 1.6   Assessment/Plan:   AKI (acute kidney injury) (HCC) With a baseline creatinine of less than 1 on admission 1.6. ARB was held she was started on IV fluids. Basi met. Panels pending this morning  Left hydronephrosis: Urology has been notified by admitting physician. She was started empirically on IV Rocephin. Continue IV fluids and Flomax hopefully the stone will pass. Urine cultures were sent. She has remained afebrile.  Questionable syncope: Holding diuretic, 2D echo is pending She has had no events on telemetry. She was placed on Norco last time she came to the ED and she uses Seroquel these could be contributing to her near syncopal episode and fall.  Hyperlipemia, mixed Continue statins.  Essential hypertension Holding lisinopril and diuretic therapy due to acute kidney injury. Continue metoprolol and amlodipine.  Paroxysmal atrial fibrillation: Continue metoprolol, she is currently sinus rhythm not on anticoagulation.  Obstructive sleep apnea on CPAP: She declined CPAP.  Bipolar disorder/general anxiety disorder: Continue lamotrigine and Seroquel.  GERD: Continue PPI.  Morbid  obesity: She has been counseled   Paroxysmal atrial fibrillation (HCC)  DVT prophylaxis: lovenox Family Communication:none Status is: Observation The patient will require care spanning > 2 midnights and should be moved to inpatient because: Acute kidney injury and possible UTI.    Code Status:     Code Status Orders  (From admission, onward)           Start     Ordered   12/22/22 0614  Full code  Continuous       Question:  By:  Answer:  Consent: discussion documented in EHR   12/22/22 0614           Code Status History     Date Active Date Inactive Code Status Order ID Comments User Context   04/15/2021 1958 04/17/2021 1427 Full Code [id number]  Farrel Gordon, PA-C ED   11/15/2012 0957 11/16/2012 1748 Full Code [id number]  Cathren Harsh, MD Inpatient   09/07/2012 1607 09/08/2012 1302 Full Code [id number]  Corum, Willeen Cass, RN Inpatient         IV Access:   Peripheral IV   Procedures and diagnostic studies:   CT Head Wo Contrast  Result Date: 12/22/2022 CLINICAL DATA:  72 year old female with episode of vertigo, fall, syncope/presyncope. EXAM: CT HEAD WITHOUT CONTRAST TECHNIQUE: Contiguous axial images were obtained from the base of the skull through the vertex without intravenous contrast. RADIATION DOSE REDUCTION: This exam was performed according to the departmental dose-optimization program which includes automated exposure control, adjustment of the mA and/or kV according to patient size and/or use of iterative reconstruction technique. COMPARISON:  Head CT 04/15/2021. FINDINGS: Brain: Cerebral volume is stable, within normal limits for  age. No midline shift, ventriculomegaly, mass effect, evidence of mass lesion, intracranial hemorrhage or evidence of cortically based acute infarction. Gray-white matter differentiation is within normal limits throughout the brain. Vascular: No suspicious intracranial vascular hyperdensity. But extensive calcified atherosclerosis at  the skull base. Skull: No acute osseous abnormality identified. Hyperostosis of the calvarium. Sinuses/Orbits: Tympanic cavities, Visualized paranasal sinuses and mastoids are clear. Other: Stable and negative orbit and scalp soft tissues. IMPRESSION: No acute intracranial abnormality. Stable and negative for age noncontrast Head CT. Electronically Signed   By: Odessa Fleming M.D.   On: 12/22/2022 05:39   DG Knee Complete 4 Views Right  Result Date: 12/22/2022 CLINICAL DATA:  Fall EXAM: RIGHT KNEE - COMPLETE 4+ VIEW COMPARISON:  None Available. FINDINGS: No definitive fracture is seen. Slight lateral deviation of patella. Mild lateral and patellofemoral degenerative change. Positive for knee effusion. Faint joint space calcifications IMPRESSION: No definitive fracture is seen. Slight lateral deviation of patella with knee effusion. Chondrocalcinosis Electronically Signed   By: Jasmine Pang M.D.   On: 12/22/2022 03:16     Medical Consultants:   None.   Subjective:    [name].  She feels better today has remained afebrile  Objective:    Vitals:   12/22/22 2108 12/22/22 2347 12/23/22 0500 12/23/22 0532  BP: (!) 175/86 (!) 161/80  134/66  Pulse: 76 74  76  Resp: 20 15  20   Temp: 98.2 F (36.8 C) 97.9 F (36.6 C)  97.8 F (36.6 C)  TempSrc: Oral   Oral  SpO2: 99% 96%  97%  Weight:   123.6 kg   Height:       SpO2: 97 %   Intake/Output Summary (Last 24 hours) at 12/23/2022 0832 Last data filed at 12/23/2022 0538 Gross per 24 hour  Intake 2004.82 ml  Output --  Net 2004.82 ml   Filed Weights   12/22/22 0238 12/23/22 0500  Weight: 124.7 kg 123.6 kg    Exam: General exam: In no acute distress. Respiratory system: Good air movement and clear to auscultation. Cardiovascular system: S1 & S2 heard, RRR. No JVD. Gastrointestinal system: Abdomen is nondistended, soft and nontender.  Extremities: No pedal edema. Skin: No rashes, lesions or ulcers Psychiatry: Judgement and insight  appear normal. Mood & affect appropriate.    Data Reviewed:    Labs: Basic Metabolic Panel: Recent Labs  Lab 12/17/22 1953 12/22/22 0300  NA 139 136  K 3.8 4.1  CL 104 101  CO2 23 24  GLUCOSE 181* 136*  BUN 18 21  CREATININE 1.00 1.60*  CALCIUM 10.5* 10.4*   GFR Estimated Creatinine Clearance: 43.2 mL/min (A) (by C-G formula based on SCr of 1.6 mg/dL (H)). Liver Function Tests: Recent Labs  Lab 12/22/22 0300  AST 19  ALT 15  ALKPHOS 76  BILITOT 0.7  PROT 6.8  ALBUMIN 3.6   No results for input(s): "LIPASE", "AMYLASE" in the last 168 hours. No results for input(s): "AMMONIA" in the last 168 hours. Coagulation profile No results for input(s): "INR", "PROTIME" in the last 168 hours. COVID-19 Labs  No results for input(s): "DDIMER", "FERRITIN", "LDH", "CRP" in the last 72 hours.  Lab Results  Component Value Date   SARSCOV2NAA NEGATIVE 04/15/2021    CBC: Recent Labs  Lab 12/17/22 1953 12/22/22 0300  WBC 6.8 7.4  HGB 14.4 13.1  HCT 42.7 38.8  MCV 92.6 93.0  PLT 222 204   Cardiac Enzymes: No results for input(s): "CKTOTAL", "CKMB", "CKMBINDEX", "TROPONINI"  in the last 168 hours. BNP (last 3 results) No results for input(s): "PROBNP" in the last 8760 hours. CBG: Recent Labs  Lab 12/22/22 0251  GLUCAP 122*   D-Dimer: No results for input(s): "DDIMER" in the last 72 hours. Hgb A1c: No results for input(s): "HGBA1C" in the last 72 hours. Lipid Profile: No results for input(s): "CHOL", "HDL", "LDLCALC", "TRIG", "CHOLHDL", "LDLDIRECT" in the last 72 hours. Thyroid function studies: No results for input(s): "TSH", "T4TOTAL", "T3FREE", "THYROIDAB" in the last 72 hours.  Invalid input(s): "FREET3" Anemia work up: No results for input(s): "VITAMINB12", "FOLATE", "FERRITIN", "TIBC", "IRON", "RETICCTPCT" in the last 72 hours. Sepsis Labs: Recent Labs  Lab 12/17/22 1953 12/22/22 0300  WBC 6.8 7.4   Microbiology Recent Results (from the past 240  hour(s))  Urine Culture     Status: None   Collection Time: 12/17/22  7:53 PM   Specimen: Urine, Clean Catch  Result Value Ref Range Status   Specimen Description   Final    URINE, CLEAN CATCH Performed at [facility], [address] [place], [place] [address]    Special Requests   Final    NONE Performed at [facility], [address], [place], [place] [address]    Culture   Final    NO GROWTH Performed at [facility], [address] [place], [place] [address]    Report Status 12/19/2022 FINAL  Final     Medications:    amLODipine  5 mg Oral QPM   lamoTRIgine  150 mg Oral BID   levothyroxine  100 mcg Oral Q0600   metoprolol succinate  50 mg Oral QPM   QUEtiapine  400 mg Oral QHS   sertraline  50 mg Oral Daily   simvastatin  40 mg Oral QPM   tamsulosin  0.4 mg Oral Daily   Continuous Infusions:  cefTRIAXone (ROCEPHIN)  IV 200 mL/hr at 12/23/22 0538      LOS: 1 day   Marinda Elk  [facility]  12/23/2022, 8:32 AM

## 2022-12-23 NOTE — Progress Notes (Signed)
Carotid artery duplex has been completed. Preliminary results can be found in CV Proc through chart review.   12/23/22 9:34 AM Olen Cordial RVT

## 2022-12-23 NOTE — Evaluation (Signed)
Physical Therapy Evaluation Patient Details Name: [name] MRN: [id number] DOB: [birth year] Today's Date: 12/23/2022  History of Present Illness  72 y.o. female past medical history significant for anxiety, bipolar disorder, chronic diastolic heart failure, diabetes mellitus type 2, essential hypertension viral pericarditis, was seen last week in the ED for nephrolithiasis discharged on cephalexin and Flomax and told to follow-up with urology brought into the ED after a fall at home, CT of the head showed no acute findings right knee x-ray showed no fracture or dislocation.  Pt admitted for AKI and Left hydronephrosis on 12/22/22.  Clinical Impression  Pt admitted with above diagnosis.  Pt currently with functional limitations due to the deficits listed below (see PT Problem List). Pt will benefit from acute skilled PT to increase their independence and safety with mobility to allow discharge.   Pt assisted with ambulating in hallway and reports mild dizziness.  Pt returned to room and was agreeable to remain in recliner however cardiac ultrasound arrived so pt assisted back to bed.        Recommendations for follow up therapy are one component of a multi-disciplinary discharge planning process, led by the attending physician.  Recommendations may be updated based on patient status, additional functional criteria and insurance authorization.  Follow Up Recommendations       Assistance Recommended at Discharge PRN  Patient can return home with the following  A little help with walking and/or transfers;Help with stairs or ramp for entrance    Equipment Recommendations None recommended by PT  Recommendations for Other Services       Functional Status Assessment Patient has had a recent decline in their functional status and demonstrates the ability to make significant improvements in function in a reasonable and predictable amount of time.     Precautions / Restrictions  Precautions Precautions: Fall      Mobility  Bed Mobility Overal bed mobility: Needs Assistance Bed Mobility: Supine to Sit     Supine to sit: Supervision, HOB elevated          Transfers Overall transfer level: Needs assistance Equipment used: Rolling walker (2 wheels) Transfers: Sit to/from Stand Sit to Stand: Min guard, Supervision                Ambulation/Gait Ambulation/Gait assistance: Min guard Gait Distance (Feet): 150 Feet Assistive device: Rolling walker (2 wheels) Gait Pattern/deviations: Step-through pattern, Decreased stride length Gait velocity: decr     General Gait Details: uses RW well, reports mild dizziness but did not become worse, distance to tolerance  Stairs            Wheelchair Mobility    Modified Rankin (Stroke Patients Only)       Balance Overall balance assessment: History of Falls                                           Pertinent Vitals/Pain Pain Assessment Pain Assessment: Faces Faces Pain Scale: Hurts even more Pain Location: abdomen (reports from her weight loss medication) Pain Descriptors / Indicators: Discomfort Pain Intervention(s): Patient requesting pain meds-RN notified, Repositioned, Monitored during session    Home Living Family/patient expects to be discharged to:: Private residence Living Arrangements: Spouse/significant other   Type of Home: House Home Access: Stairs to enter Entrance Stairs-Rails: Right Entrance Stairs-Number of Steps: 2   Home Layout: One level Home Equipment: Rolling  Walker (2 wheels)      Prior Function Prior Level of Function : Independent/Modified Independent             Mobility Comments: has had 2 falls in the past month - believes from syncopal episodes       Hand Dominance        Extremity/Trunk Assessment        Lower Extremity Assessment Lower Extremity Assessment: Generalized weakness       Communication    Communication: No difficulties  Cognition Arousal/Alertness: Awake/alert Behavior During Therapy: WFL for tasks assessed/performed Overall Cognitive Status: History of cognitive impairments - at baseline                                 General Comments: pt reports hx of short term memory loss        General Comments      Exercises     Assessment/Plan    PT Assessment Patient needs continued PT services  PT Problem List Decreased strength;Decreased balance;Decreased mobility;Decreased activity tolerance;Obesity       PT Treatment Interventions DME instruction;Gait training;Functional mobility training;Therapeutic activities;Patient/family education;Therapeutic exercise;Balance training;Stair training    PT Goals (Current goals can be found in the Care Plan section)  Acute Rehab PT Goals PT Goal Formulation: With patient Time For Goal Achievement: 01/06/23 Potential to Achieve Goals: Good    Frequency Min 1X/week     Co-evaluation               AM-PAC PT "6 Clicks" Mobility  Outcome Measure Help needed turning from your back to your side while in a flat bed without using bedrails?: A Little Help needed moving from lying on your back to sitting on the side of a flat bed without using bedrails?: A Little Help needed moving to and from a bed to a chair (including a wheelchair)?: A Little Help needed standing up from a chair using your arms (e.g., wheelchair or bedside chair)?: A Little Help needed to walk in hospital room?: A Little Help needed climbing 3-5 steps with a railing? : A Little 6 Click Score: 18    End of Session   Activity Tolerance: Patient tolerated treatment well Patient left: in bed;with call bell/phone within reach (with cardiac ultrasound) Nurse Communication: Mobility status PT Visit Diagnosis: Difficulty in walking, not elsewhere classified (R26.2)    Time: 7829-5621 PT Time Calculation (min) (ACUTE ONLY): 25  min   Charges:   PT Evaluation $PT Eval Low Complexity: 1 Low         Kati PT, DPT Physical Therapist Acute Rehabilitation Services Office: [phone]   Janan Halter Payson 12/23/2022, 3:56 PM

## 2022-12-23 NOTE — Progress Notes (Signed)
  Echocardiogram 2D Echocardiogram has been performed.  [name] Banker 12/23/2022, 2:58 PM

## 2022-12-23 NOTE — TOC Initial Note (Signed)
Transition of Care [facility]) - Initial/Assessment Note    Patient Details  Name: [name] MRN: [id number] Date of Birth: [birth year]  Transition of [facility]) CM/SW Contact:    Otelia Santee, LCSW Phone Number: 12/23/2022, 3:26 PM  Clinical Narrative:                 Pt currently lives at home with spouse and uses RW at baseline. Pt and spouse are agreeable to recommendation for home health PT. No preference for HHA. HHPT has been arranged with [facility]. HH order will need to be placed prior to discharge.   Expected Discharge Plan: Home w Home Health Services Barriers to Discharge: Continued Medical Work up   Patient Goals and CMS Choice Patient states their goals for this hospitalization and ongoing recovery are:: To return home CMS Medicare.gov Compare Post Acute Care list provided to:: Patient Choice offered to / list presented to : Patient, Spouse [place] ownership interest in [facility].provided to:: Patient    Expected Discharge Plan and Services In-house Referral: Clinical Social Work Discharge Planning Services: NA Post Acute Care Choice: Home Health Living arrangements for the past 2 months: Single Family Home                 DME Arranged: N/A DME Agency: NA       HH Arranged: PT HH Agency: [facility] Date HH Agency Contacted: 12/23/22 Time HH Agency Contacted: 1526 Representative spoke with at [facility] Agency: Tresa Endo  Prior Living Arrangements/Services Living arrangements for the past 2 months: Single Family Home Lives with:: Spouse Patient language and need for interpreter reviewed:: Yes Do you feel safe going back to the place where you live?: Yes      Need for Family Participation in Patient Care: Yes (Comment) Care giver support system in place?: Yes (comment) Current home services: DME (RW) Criminal Activity/Legal Involvement Pertinent to Current Situation/Hospitalization: No - Comment as needed  Activities of Daily  Living Home Assistive Devices/Equipment: Environmental consultant (specify type) ADL Screening (condition at time of admission) Patient's cognitive ability adequate to safely complete daily activities?: Yes Is the patient deaf or have difficulty hearing?: No Does the patient have difficulty seeing, even when wearing glasses/contacts?: No Does the patient have difficulty concentrating, remembering, or making decisions?: Yes Patient able to express need for assistance with ADLs?: Yes Does the patient have difficulty dressing or bathing?: No Independently performs ADLs?: Yes (appropriate for developmental age) Does the patient have difficulty walking or climbing stairs?: Yes Weakness of Legs: Both Weakness of Arms/Hands: None  Permission Sought/Granted Permission sought to share information with : Facility Medical sales representative, Family Supports Permission granted to share information with : Yes, Verbal Permission Granted  Share Information with NAME: Maleyah Buchannan  Permission granted to share info w AGENCY: [facility]  Permission granted to share info w Relationship: Spouse  Permission granted to share info w Contact Information: [phone]  Emotional Assessment Appearance:: Appears older than stated age Attitude/Demeanor/Rapport: Engaged Affect (typically observed): Accepting, Other (comment) (confused) Orientation: : Oriented to Self, Oriented to Place, Oriented to  Time, Oriented to Situation Alcohol / Substance Use: Not Applicable Psych Involvement: No (comment)  Admission diagnosis:  AKI (acute kidney injury) (HCC) [N17.9] Patient Active Problem List   Diagnosis Date Noted   AKI (acute kidney injury) (HCC) 12/22/2022   Class 3 obesity (HCC) 12/22/2022   Hydronephrosis of left kidney 12/22/2022   Fall 12/22/2022   Bipolar affective disorder, mixed, severe, with psychotic behavior (HCC)  04/16/2021   [name] disorder (HCC) 09/19/2018   GAD (generalized anxiety disorder) 09/19/2018   Panic  09/19/2018   GERD (gastroesophageal reflux disease) 05/20/2014   Abdominal pain, generalized 05/20/2014   Nausea and vomiting 11/15/2012   Diarrhea 11/15/2012   OSA on CPAP 11/15/2012   Bipolar 1 disorder, depressed (HCC) 11/15/2012   Incisional hernia without mention of obstruction or gangrene 07/19/2011   DYSPNEA 10/25/2010   Hyperlipemia, mixed 10/22/2010   OBESITY-MORBID (>100') 10/22/2010   Essential hypertension 10/22/2010   Paroxysmal atrial fibrillation (HCC) 10/22/2010   CHEST PAIN-UNSPECIFIED 10/22/2010   PCP:  Irven Coe, MD Pharmacy:   [facility] [phone] [place], [place] - [address] [place] [address] Phone: [phone] Fax: [phone]  [facility] - [place], [place] - [address] [place] [address] Phone: [phone] Fax: [phone]  [id number] - [place], [place] - [address] AT [address] [place] [address] Phone: [phone] Fax: [phone]     Social Determinants of Health (SDOH) Social History: SDOH Screenings   Food Insecurity: No Food Insecurity (12/22/2022)  Housing: Low Risk  (12/22/2022)  Transportation Needs: No Transportation Needs (12/22/2022)  Utilities: Not At Risk (12/22/2022)  Depression (PHQ2-9): Medium Risk (03/17/2021)  Tobacco Use: Low Risk  (12/22/2022)   SDOH Interventions:     Readmission Risk Interventions    12/23/2022    3:21 PM  Readmission Risk Prevention Plan  Post Dischage Appt Complete  Medication Screening Complete  Transportation Screening Complete

## 2022-12-24 DIAGNOSIS — N179 Acute kidney failure, unspecified: Secondary | ICD-10-CM | POA: Diagnosis not present

## 2022-12-24 DIAGNOSIS — F319 Bipolar disorder, unspecified: Secondary | ICD-10-CM | POA: Diagnosis not present

## 2022-12-24 DIAGNOSIS — N1330 Unspecified hydronephrosis: Secondary | ICD-10-CM | POA: Diagnosis not present

## 2022-12-24 DIAGNOSIS — I10 Essential (primary) hypertension: Secondary | ICD-10-CM | POA: Diagnosis not present

## 2022-12-24 MED ORDER — ORAL CARE MOUTH RINSE: 15.0000 mL | OROMUCOSAL | Status: DC | PRN

## 2022-12-24 MED ORDER — SODIUM CHLORIDE 0.9 % IV SOLN: INTRAVENOUS | Status: AC

## 2022-12-24 MED ORDER — SODIUM CHLORIDE 0.9 % IV SOLN: INTRAVENOUS | Status: DC | PRN

## 2022-12-24 NOTE — Plan of Care (Signed)
  Problem: Education: Goal: Knowledge of General Education information will improve Description: Including pain rating scale, medication(s)/side effects and non-pharmacologic comfort measures 12/24/2022 2222 by Rosana Fret, RN Outcome: Progressing 12/24/2022 2219 by Rosana Fret, RN Outcome: Progressing   Problem: Health Behavior/Discharge Planning: Goal: Ability to manage health-related needs will improve 12/24/2022 2222 by Rosana Fret, RN Outcome: Progressing 12/24/2022 2219 by Rosana Fret, RN Outcome: Progressing   Problem: Clinical Measurements: Goal: Ability to maintain clinical measurements within normal limits will improve 12/24/2022 2222 by Rosana Fret, RN Outcome: Progressing 12/24/2022 2219 by Rosana Fret, RN Outcome: Progressing Goal: Will remain free from infection 12/24/2022 2222 by Rosana Fret, RN Outcome: Progressing 12/24/2022 2219 by Rosana Fret, RN Outcome: Progressing Goal: Diagnostic test results will improve 12/24/2022 2222 by Rosana Fret, RN Outcome: Progressing 12/24/2022 2219 by Rosana Fret, RN Outcome: Progressing Goal: Respiratory complications will improve 12/24/2022 2222 by Rosana Fret, RN Outcome: Progressing 12/24/2022 2219 by Rosana Fret, RN Outcome: Progressing Goal: Cardiovascular complication will be avoided 12/24/2022 2222 by Rosana Fret, RN Outcome: Progressing 12/24/2022 2219 by Rosana Fret, RN Outcome: Progressing   Problem: Activity: Goal: Risk for activity intolerance will decrease 12/24/2022 2222 by Rosana Fret, RN Outcome: Progressing 12/24/2022 2219 by Rosana Fret, RN Outcome: Progressing   Problem: Nutrition: Goal: Adequate nutrition will be maintained 12/24/2022 2222 by Rosana Fret, RN Outcome: Progressing 12/24/2022 2219 by Rosana Fret, RN Outcome: Progressing   Problem: Coping: Goal: Level of anxiety will decrease 12/24/2022 2222 by Rosana Fret, RN Outcome:  Progressing 12/24/2022 2219 by Rosana Fret, RN Outcome: Progressing   Problem: Elimination: Goal: Will not experience complications related to bowel motility 12/24/2022 2222 by Rosana Fret, RN Outcome: Progressing 12/24/2022 2219 by Rosana Fret, RN Outcome: Progressing Goal: Will not experience complications related to urinary retention 12/24/2022 2222 by Rosana Fret, RN Outcome: Progressing 12/24/2022 2219 by Rosana Fret, RN Outcome: Progressing   Problem: Pain Managment: Goal: General experience of comfort will improve 12/24/2022 2222 by Rosana Fret, RN Outcome: Progressing 12/24/2022 2219 by Rosana Fret, RN Outcome: Progressing   Problem: Safety: Goal: Ability to remain free from injury will improve 12/24/2022 2222 by Rosana Fret, RN Outcome: Progressing 12/24/2022 2219 by Rosana Fret, RN Outcome: Progressing   Problem: Skin Integrity: Goal: Risk for impaired skin integrity will decrease 12/24/2022 2222 by Rosana Fret, RN Outcome: Progressing 12/24/2022 2219 by Rosana Fret, RN Outcome: Progressing

## 2022-12-24 NOTE — Progress Notes (Signed)
[facility] PROGRESS NOTE    Progress Note  [name]  WUJ:[id number] DOB: 19-May-1951 DOA: 12/22/2022 PCP: Irven Coe, MD     Brief Narrative:   [name] is an 72 y.o. female past medical history significant for anxiety, bipolar disorder, chronic diastolic heart failure, diabetes mellitus type 2, essential hypertension viral pericarditis, was seen last week in the ED for nephrolithiasis discharged on cephalexin and Flomax and told to follow-up with urology brought into the ED after a fall at home, CT of the head showed no acute findings right knee x-ray showed no fracture or dislocation, CT of the renal stones done last week showed left-sided hydronephrosis with hydroureter with a 4 mm stone close to the UPJ in the ED UA was concerning for infection creatinine 1.6   Assessment/Plan:   AKI (acute kidney injury) (HCC) With a baseline creatinine of less than 1 on admission 1.6. ARB was held she was started on IV fluids. Basic metabolic panel this morning is 1.5. Continue IV fluids for an additional 24 hours.  Left hydronephrosis: She was started empirically on IV Rocephin. Continue IV fluids and Flomax hopefully the stone will pass. Urine cultures growing less than 10,000 colonies She has remained afebrile.  Questionable syncope: Holding diuretic, 2D echo is pending She has had no events on telemetry. She was placed on Norco last time she came to the ED and she uses Seroquel these could be contributing to her near syncopal episode and fall. PT evaluated the patient, will need home health PT  Hyperlipemia, mixed Continue statins.  Essential hypertension Holding lisinopril and diuretic therapy due to acute kidney injury. Continue metoprolol and amlodipine.  Paroxysmal atrial fibrillation: Continue metoprolol, she is currently sinus rhythm not on anticoagulation.  Obstructive sleep apnea on CPAP: She declined CPAP.  Bipolar disorder/general anxiety  disorder: Continue lamotrigine and Seroquel.  GERD: Continue PPI.  Morbid obesity: She has been counseled   DVT prophylaxis: lovenox Family Communication:none Status is: Observation The patient will require care spanning > 2 midnights and should be moved to inpatient because: Acute kidney injury and possible UTI.    Code Status:     Code Status Orders  (From admission, onward)           Start     Ordered   12/22/22 0614  Full code  Continuous       Question:  By:  Answer:  Consent: discussion documented in EHR   12/22/22 0614           Code Status History     Date Active Date Inactive Code Status Order ID Comments User Context   04/15/2021 1958 04/17/2021 1427 Full Code [id number]  Farrel Gordon, PA-C ED   11/15/2012 0957 11/16/2012 1748 Full Code [id number]  Cathren Harsh, MD Inpatient   09/07/2012 1607 09/08/2012 1302 Full Code [id number]  Sibyl Parr, RN Inpatient         IV Access:   Peripheral IV   Procedures and diagnostic studies:   VAS US CAROTID  Result Date: 12/23/2022 Carotid Arterial Duplex Study Patient Name:  Yolanda GOTCH  Date of Exam:   12/23/2022 Medical Rec #: [id number]        Accession #:    [id number] Date of Birth: 18-Jun-1951        Patient Gender: F Patient Age:   78 years Exam Location:  [facility] Procedure:      VAS US CAROTID Referring Phys: Onalee Hua ORTIZ --------------------------------------------------------------------------------  Indications:       Syncope. Risk Factors:      Hypertension, hyperlipidemia. Limitations        Today's exam was limited due to the body habitus of the                    patient, patient movement and the patient's respiratory                    variation. Comparison Study:  No prior studies. Performing Technologist: Chanda Busing RVT  Examination Guidelines: A complete evaluation includes B-mode imaging, spectral Doppler, color Doppler, and power Doppler as needed of all accessible portions of  each vessel. Bilateral testing is considered an integral part of a complete examination. Limited examinations for reoccurring indications may be performed as noted.  Right Carotid Findings: +----------+--------+--------+--------+-----------------------+--------+           PSV cm/sEDV cm/sStenosisPlaque Description     Comments +----------+--------+--------+--------+-----------------------+--------+ CCA Prox  90      12                                              +----------+--------+--------+--------+-----------------------+--------+ CCA Distal75      14              smooth and heterogenous         +----------+--------+--------+--------+-----------------------+--------+ ICA Prox  106     34              smooth and heterogenous         +----------+--------+--------+--------+-----------------------+--------+ ICA Mid   81      26                                              +----------+--------+--------+--------+-----------------------+--------+ ICA Distal74      23                                     tortuous +----------+--------+--------+--------+-----------------------+--------+ ECA       108     9                                               +----------+--------+--------+--------+-----------------------+--------+ +----------+--------+-------+--------+-------------------+           PSV cm/sEDV cmsDescribeArm Pressure (mmHG) +----------+--------+-------+--------+-------------------+ Subclavian170                                        +----------+--------+-------+--------+-------------------+ +---------+--------+--+--------+--+---------+ VertebralPSV cm/s44EDV cm/s13Antegrade +---------+--------+--+--------+--+---------+  Left Carotid Findings: +----------+--------+--------+--------+-----------------------+--------+           PSV cm/sEDV cm/sStenosisPlaque Description     Comments  +----------+--------+--------+--------+-----------------------+--------+ CCA Prox  100     15                                              +----------+--------+--------+--------+-----------------------+--------+ CCA Distal86  14              smooth and heterogenous         +----------+--------+--------+--------+-----------------------+--------+ ICA Prox  105     21              smooth and heterogenous         +----------+--------+--------+--------+-----------------------+--------+ ICA Mid   52      11              smooth and heterogenous         +----------+--------+--------+--------+-----------------------+--------+ ICA Distal64      17                                     tortuous +----------+--------+--------+--------+-----------------------+--------+ ECA       122     12                                              +----------+--------+--------+--------+-----------------------+--------+ +----------+--------+--------+--------+-------------------+           PSV cm/sEDV cm/sDescribeArm Pressure (mmHG) +----------+--------+--------+--------+-------------------+ [id number]                                         +----------+--------+--------+--------+-------------------+ +---------+--------+--+--------+--+---------+ VertebralPSV cm/s42EDV cm/s11Antegrade +---------+--------+--+--------+--+---------+   Summary: Right Carotid: Velocities in the right ICA are consistent with a 1-39% stenosis. Left Carotid: Velocities in the left ICA are consistent with a 1-39% stenosis. Vertebrals: Bilateral vertebral arteries demonstrate antegrade flow. *See table(s) above for measurements and observations.     Preliminary      Medical Consultants:   None.   Subjective:    Demoni H Corrigan no new complaints today.  Objective:    Vitals:   12/23/22 1349 12/23/22 1918 12/24/22 0439 12/24/22 0554  BP: (!) 163/77 (!) 163/78 (!) 142/68   Pulse: 85 74 70    Resp: 14 20 20    Temp: 97.9 F (36.6 C) 98.4 F (36.9 C) 97.9 F (36.6 C)   TempSrc:  Oral    SpO2: 98% 95% 97%   Weight:    113.9 kg  Height:       SpO2: 97 %   Intake/Output Summary (Last 24 hours) at 12/24/2022 0837 Last data filed at 12/24/2022 0423 Gross per 24 hour  Intake 864.52 ml  Output --  Net 864.52 ml    Filed Weights   12/22/22 0238 12/23/22 0500 12/24/22 0554  Weight: 124.7 kg 123.6 kg 113.9 kg    Exam: General exam: In no acute distress. Respiratory system: Good air movement and clear to auscultation. Cardiovascular system: S1 & S2 heard, RRR. No JVD. Gastrointestinal system: Abdomen is nondistended, soft and nontender.  Extremities: No pedal edema. Skin: No rashes, lesions or ulcers Psychiatry: Judgement and insight appear normal. Mood & affect appropriate.   Data Reviewed:    Labs: Basic Metabolic Panel: Recent Labs  Lab 12/17/22 1953 12/22/22 0300 12/23/22 1106  NA 139 136 140  K 3.8 4.1 4.4  CL 104 101 109  CO2 23 24 25   GLUCOSE 181* 136* 146*  BUN 18 21 14   CREATININE 1.00 1.60* 1.55*  CALCIUM 10.5* 10.4* 9.9    GFR Estimated Creatinine Clearance: 42.5 mL/min (A) (by  C-G formula based on SCr of 1.55 mg/dL (H)). Liver Function Tests: Recent Labs  Lab 12/22/22 0300  AST 19  ALT 15  ALKPHOS 76  BILITOT 0.7  PROT 6.8  ALBUMIN 3.6    No results for input(s): "LIPASE", "AMYLASE" in the last 168 hours. No results for input(s): "AMMONIA" in the last 168 hours. Coagulation profile No results for input(s): "INR", "PROTIME" in the last 168 hours. COVID-19 Labs  No results for input(s): "DDIMER", "FERRITIN", "LDH", "CRP" in the last 72 hours.  Lab Results  Component Value Date   SARSCOV2NAA NEGATIVE 04/15/2021    CBC: Recent Labs  Lab 12/17/22 1953 12/22/22 0300  WBC 6.8 7.4  HGB 14.4 13.1  HCT 42.7 38.8  MCV 92.6 93.0  PLT 222 204    Cardiac Enzymes: No results for input(s): "CKTOTAL", "CKMB", "CKMBINDEX",  "TROPONINI" in the last 168 hours. BNP (last 3 results) No results for input(s): "PROBNP" in the last 8760 hours. CBG: Recent Labs  Lab 12/22/22 0251  GLUCAP 122*    D-Dimer: No results for input(s): "DDIMER" in the last 72 hours. Hgb A1c: No results for input(s): "HGBA1C" in the last 72 hours. Lipid Profile: No results for input(s): "CHOL", "HDL", "LDLCALC", "TRIG", "CHOLHDL", "LDLDIRECT" in the last 72 hours. Thyroid function studies: No results for input(s): "TSH", "T4TOTAL", "T3FREE", "THYROIDAB" in the last 72 hours.  Invalid input(s): "FREET3" Anemia work up: No results for input(s): "VITAMINB12", "FOLATE", "FERRITIN", "TIBC", "IRON", "RETICCTPCT" in the last 72 hours. Sepsis Labs: Recent Labs  Lab 12/17/22 1953 12/22/22 0300  WBC 6.8 7.4    Microbiology Recent Results (from the past 240 hour(s))  Urine Culture     Status: None   Collection Time: 12/17/22  7:53 PM   Specimen: Urine, Clean Catch  Result Value Ref Range Status   Specimen Description   Final    URINE, CLEAN CATCH Performed at [facility], [address], [place], [place] [address]    Special Requests   Final    NONE Performed at [facility], [address] [place], [place] [address]    Culture   Final    NO GROWTH Performed at [facility], [address], [place], [place] [address]    Report Status 12/19/2022 FINAL  Final  Urine Culture     Status: Abnormal   Collection Time: 12/22/22  4:55 AM   Specimen: Urine, Clean Catch  Result Value Ref Range Status   Specimen Description   Final    URINE, CLEAN CATCH Performed at [facility], [address] [place], [place] [address]    Special Requests   Final    NONE Performed at [facility], [address], [place], [place] [address]    Culture (A)  Final    <10,000 COLONIES/mL INSIGNIFICANT GROWTH Performed at [facility], [address],  [place], [place] [address]    Report Status 12/23/2022 FINAL  Final     Medications:    amLODipine  5 mg Oral QPM   lamoTRIgine  150 mg Oral BID   levothyroxine  100 mcg Oral Q0600   metoprolol succinate  50 mg Oral QPM   polyethylene glycol  17 g Oral Daily   QUEtiapine  400 mg Oral QHS   sertraline  50 mg Oral Daily   simvastatin  40 mg Oral QPM   tamsulosin  0.4 mg Oral Daily   Continuous Infusions:  sodium chloride  75 mL/hr at 12/24/22 0423   cefTRIAXone (ROCEPHIN)  IV 2 g (12/24/22 0555)      LOS: 2 days   Marinda Elk  [facility]  12/24/2022, 8:37 AM

## 2022-12-24 NOTE — Progress Notes (Signed)
Mobility Specialist - Progress Note   12/24/22 1215  Mobility  Activity Ambulated with assistance in hallway  Level of Assistance Standby assist, set-up cues, supervision of patient - no hands on  Assistive Device Front wheel walker  Distance Ambulated (ft) 200 ft  Range of Motion/Exercises Active  Activity Response Tolerated well  Mobility Referral Yes  $Mobility charge 1 Mobility   Pt received in bed and agreed to mobility, had some discomfort in R knee, and fatigued during session. Pt returned to bed with all needs met.  Marilynne Halsted Mobility Specialist

## 2022-12-24 NOTE — Plan of Care (Signed)
Patient remains on [facility] 5E. MIVF stopped per order and transitioned to [facility] only. Fall prevention bundle initiated by this RN; no prior fall prevention measures were actually in place upon assessment besides non-slip socks. Education assessment completed by this RN for this admission (prior assessment was 2015). Patient education provided regarding holding her weekly home Munjaro injection until she could discuss with attending MD.

## 2022-12-24 NOTE — Consult Note (Signed)
Subjective: 1. [name] (acute kidney injury) [facility])      Consult requested by Dr. Lambert Keto Atia [name] is a 72 yo female who was admitted on 12/22/22 after she passed out at home.  She was seen in our office on 12/19/22 for a 4mm left proximal stone with mild hydro.  She was started on tamsulosin, zofran and percocet and also bactrim for possible infection but her culture was negative.  She has no flank pain at this time and denies nausea.  She is afebrile and a repeat culture on 5/2 had only 10K colonies.  She has some urinary frequency but no nausea at this time.     ROS:  Review of Systems  Gastrointestinal:  Negative for nausea.  Genitourinary:  Positive for frequency. Negative for flank pain.  Psychiatric/Behavioral:  Positive for memory loss.   All other systems reviewed and are negative.   Allergies  Allergen Reactions   Maxzide [Triamterene-Hctz] Other (See Comments)    Hurt patient's kidneys   Celexa [Citalopram Hydrobromide] Nausea Only   Metformin And Related Nausea And Vomiting   Other Nausea And Vomiting and Other (See Comments)    Unnamed medication hurt the patient's kidneys   Reglan [Metoclopramide] Nausea Only   Risperdal [Risperidone] Nausea Only and Other (See Comments)    Also caused hallucinations   Trazodone And Nefazodone Nausea Only   Zestril [Lisinopril] Nausea Only   Lithium Palpitations and Other (See Comments)    Shakes and delusional pt started seeing things      Past Medical History:  Diagnosis Date   Anxiety    Arthritis    Asthma    Bipolar 1 disorder (HCC)    CHF (congestive heart failure) (HCC)    Complication of anesthesia    left a bad taste in my mouth it has lasted since january    Depression    Diabetes mellitus    Hernia    umbilical   Hyperlipidemia    Hypertension    Hypothyroidism    Pericarditis, viral 2010 October   Sleep apnea    uses cpap setting of 15    Past Surgical History:  Procedure Laterality Date    ABDOMINAL HYSTERECTOMY     APPENDECTOMY     CHOLECYSTECTOMY  1984   open   COLONOSCOPY WITH PROPOFOL N/A 05/20/2014   Procedure: COLONOSCOPY WITH PROPOFOL;  Surgeon: [name] Friar, MD;  Location: [facility];  Service: Endoscopy;  Laterality: N/A;   ESOPHAGOGASTRODUODENOSCOPY (EGD) WITH PROPOFOL N/A 05/20/2014   Procedure: ESOPHAGOGASTRODUODENOSCOPY (EGD) WITH PROPOFOL;  Surgeon: [name] Friar, MD;  Location: [facility];  Service: Endoscopy;  Laterality: N/A;   FOOT SURGERY     bone spurs    KNEE ARTHROSCOPY  09/07/2012   Procedure: ARTHROSCOPY KNEE;  Surgeon: Yolanda Mustard, MD;  Location: [facility] OR;  Service: Orthopedics;  Laterality: Left;  Left Knee Arthroscopy    Social History   Socioeconomic History   Marital status: Married    Spouse name: Not on file   Number of children: Not on file   Years of education: Not on file   Highest education level: Not on file  Occupational History   Not on file  Tobacco Use   Smoking status: Never   Smokeless tobacco: Never  Substance and Sexual Activity   Alcohol use: No   Drug use: No   Sexual activity: Not on file  Other Topics Concern   Not on file  Social History Narrative  Not on file   Social Determinants of Health   Financial Resource Strain: Not on file  Food Insecurity: No Food Insecurity (12/22/2022)   Hunger Vital Sign    Worried About Running Out of Food in the Last Year: Never true    Ran Out of Food in the Last Year: Never true  Transportation Needs: No Transportation Needs (12/22/2022)   PRAPARE - Administrator, Civil Service (Medical): No    Lack of Transportation (Non-Medical): No  Physical Activity: Not on file  Stress: Not on file  Social Connections: Not on file  Intimate Partner Violence: Not At Risk (12/22/2022)   Humiliation, Afraid, Rape, and Kick questionnaire    Fear of Current or Ex-Partner: No    Emotionally Abused: No    Physically Abused: No    Sexually Abused: No    Family  History  Problem Relation Age of Onset   Diabetes Mother    Hypertension Mother    Hyperlipidemia Mother    Diabetes Father    Hypertension Father    Hyperlipidemia Father    Hypertension Sister     Anti-infectives: Anti-infectives (From admission, onward)    Start     Dose/Rate Route Frequency Ordered Stop   12/23/22 0600  cefTRIAXone (ROCEPHIN) 2 g in sodium chloride 0.9 % 100 mL IVPB        2 g 200 mL/hr over 30 Minutes Intravenous Every 24 hours 12/22/22 0810     12/22/22 0815  cefTRIAXone (ROCEPHIN) 1 g in sodium chloride 0.9 % 100 mL IVPB       Note to Pharmacy: Over 100 kg.   1 g 200 mL/hr over 30 Minutes Intravenous  Once 12/22/22 0810 12/22/22 1044   12/22/22 0615  cefTRIAXone (ROCEPHIN) 1 g in sodium chloride 0.9 % 100 mL IVPB        1 g 200 mL/hr over 30 Minutes Intravenous  Once 12/22/22 0981 12/22/22 1914       Current Facility-Administered Medications  Medication Dose Route Frequency Provider Last Rate Last Admin   0.9 %  sodium chloride infusion   Intravenous Continuous [name] Elk, MD   Stopping previously hung infusion at 12/24/22 0921   acetaminophen (TYLENOL) tablet 650 mg  650 mg Oral Q6H PRN [name] Mo, MD   650 mg at 12/23/22 2011   Or   acetaminophen (TYLENOL) suppository 650 mg  650 mg Rectal Q6H PRN [name] Mo, MD       amLODipine (NORVASC) tablet 5 mg  5 mg Oral QPM [name] Mo, MD   5 mg at 12/23/22 1722   cefTRIAXone (ROCEPHIN) 2 g in sodium chloride 0.9 % 100 mL IVPB  2 g Intravenous Q24H [name] Mo, MD 200 mL/hr at 12/24/22 0555 2 g at 12/24/22 0555   fentaNYL (SUBLIMAZE) injection 25 mcg  25 mcg Intravenous Q2H PRN [name] Mo, MD       lamoTRIgine (LAMICTAL) tablet 150 mg  150 mg Oral BID [name] Mo, MD   150 mg at 12/24/22 7829   levothyroxine (SYNTHROID) tablet 100 mcg  100 mcg Oral Q0600 [name] Mo, MD   100 mcg at 12/24/22 0553   metoprolol succinate (TOPROL-XL) 24 hr  tablet 50 mg  50 mg Oral QPM [name] Mo, MD   50 mg at 12/23/22 1722   naloxone [facility]) injection 0.4 mg  0.4 mg Intravenous PRN [name] Mo, MD       ondansetron (  ZOFRAN) injection 4 mg  4 mg Intravenous Q6H PRN [name] Mo, MD       polyethylene glycol [facility]) packet 17 g  17 g Oral Daily [name] Elk, MD   17 g at 12/24/22 2956   QUEtiapine (SEROQUEL XR) 24 hr tablet 400 mg  400 mg Oral QHS [name] Mo, MD   400 mg at 12/23/22 2102   sertraline (ZOLOFT) tablet 50 mg  50 mg Oral Daily [name] Mo, MD   50 mg at 12/24/22 0818   simvastatin (ZOCOR) tablet 40 mg  40 mg Oral QPM [name] Mo, MD   40 mg at 12/23/22 1722   tamsulosin (FLOMAX) capsule 0.4 mg  0.4 mg Oral Daily [name] Mo, MD   0.4 mg at 12/24/22 0818     Objective: Vital signs in last 24 hours: BP (!) 142/68 (BP Location: Left Arm)   Pulse 70   Temp 97.9 F (36.6 C)   Resp 20   Ht 5\' 5"  (1.651 m)   Wt 113.9 kg   SpO2 97%   BMI 41.77 kg/m   Intake/Output from previous day: 05/03 0701 - 05/04 0700 In: 864.5 [P.O.:480; I.V.:384.5] Out: -  Intake/Output this shift: No intake/output data recorded.   Physical Exam Vitals reviewed.  Constitutional:      Appearance: Normal appearance. She is obese.  Abdominal:     Palpations: Abdomen is soft.     Tenderness: There is no abdominal tenderness. There is no right CVA tenderness or left CVA tenderness.  Neurological:     Mental Status: She is alert.     Lab Results:  Results for orders placed or performed during the hospital encounter of 12/22/22 (from the past 24 hour(s))  Basic metabolic panel     Status: Abnormal   Collection Time: 12/23/22 11:06 AM  Result Value Ref Range   Sodium 140 135 - 145 mmol/L   Potassium 4.4 3.5 - 5.1 mmol/L   Chloride 109 98 - 111 mmol/L   CO2 25 22 - 32 mmol/L   Glucose, Bld 146 (H) 70 - 99 mg/dL   BUN 14 8 - 23 mg/dL   Creatinine, Ser 2.13 (H) 0.44 -  1.00 mg/dL   Calcium 9.9 8.9 - 08.6 mg/dL   GFR, Estimated 36 (L) >60 mL/min   Anion gap 6 5 - 15    BMET Recent Labs    12/22/22 0300 12/23/22 1106  NA 136 140  K 4.1 4.4  CL 101 109  CO2 24 25  GLUCOSE 136* 146*  BUN 21 14  CREATININE 1.60* 1.55*  CALCIUM 10.4* 9.9   PT/INR No results for input(s): "LABPROT", "INR" in the last 72 hours. ABG No results for input(s): "PHART", "HCO3" in the last 72 hours.  Invalid input(s): "PCO2", "PO2"  Studies/Results: VAS US CAROTID  Result Date: 12/24/2022 Carotid Arterial Duplex Study Patient Name:  [name] [name]  Date of Exam:   12/23/2022 Medical Rec #: [id number]        Accession #:    [id number] Date of Birth: [birth year]        Patient Gender: F Patient Age:   77 years Exam [facility]:  [facility] Procedure:      VAS US CAROTID Referring Phys: DAVID ORTIZ --------------------------------------------------------------------------------  Indications:       Syncope. Risk Factors:      Hypertension, hyperlipidemia. Limitations        Today's exam was limited due to  the body habitus of the                    patient, patient movement and the patient's respiratory                    variation. Comparison Study:  No prior studies. Performing Technologist: Chanda Busing RVT  Examination Guidelines: A complete evaluation includes B-mode imaging, spectral Doppler, color Doppler, and power Doppler as needed of all accessible portions of each vessel. Bilateral testing is considered an integral part of a complete examination. Limited examinations for reoccurring indications may be performed as noted.  Right Carotid Findings: +----------+--------+--------+--------+-----------------------+--------+           PSV cm/sEDV cm/sStenosisPlaque Description     Comments +----------+--------+--------+--------+-----------------------+--------+ CCA Prox  90      12                                               +----------+--------+--------+--------+-----------------------+--------+ CCA Distal75      14              smooth and heterogenous         +----------+--------+--------+--------+-----------------------+--------+ ICA Prox  106     34              smooth and heterogenous         +----------+--------+--------+--------+-----------------------+--------+ ICA Mid   81      26                                              +----------+--------+--------+--------+-----------------------+--------+ ICA Distal74      23                                     tortuous +----------+--------+--------+--------+-----------------------+--------+ ECA       108     9                                               +----------+--------+--------+--------+-----------------------+--------+ +----------+--------+-------+--------+-------------------+           PSV cm/sEDV cmsDescribeArm Pressure (mmHG) +----------+--------+-------+--------+-------------------+ Subclavian170                                        +----------+--------+-------+--------+-------------------+ +---------+--------+--+--------+--+---------+ VertebralPSV cm/s44EDV cm/s13Antegrade +---------+--------+--+--------+--+---------+  Left Carotid Findings: +----------+--------+--------+--------+-----------------------+--------+           PSV cm/sEDV cm/sStenosisPlaque Description     Comments +----------+--------+--------+--------+-----------------------+--------+ CCA Prox  100     15                                              +----------+--------+--------+--------+-----------------------+--------+ CCA Distal86      14              smooth and heterogenous         +----------+--------+--------+--------+-----------------------+--------+ ICA Prox  105  21              smooth and heterogenous         +----------+--------+--------+--------+-----------------------+--------+ ICA Mid   52      11               smooth and heterogenous         +----------+--------+--------+--------+-----------------------+--------+ ICA Distal64      17                                     tortuous +----------+--------+--------+--------+-----------------------+--------+ ECA       122     12                                              +----------+--------+--------+--------+-----------------------+--------+ +----------+--------+--------+--------+-------------------+           PSV cm/sEDV cm/sDescribeArm Pressure (mmHG) +----------+--------+--------+--------+-------------------+ [id number]                                         +----------+--------+--------+--------+-------------------+ +---------+--------+--+--------+--+---------+ VertebralPSV cm/s42EDV cm/s11Antegrade +---------+--------+--+--------+--+---------+   Summary: Right Carotid: Velocities in the right ICA are consistent with a 1-39% stenosis. Left Carotid: Velocities in the left ICA are consistent with a 1-39% stenosis. Vertebrals: Bilateral vertebral arteries demonstrate antegrade flow. *See table(s) above for measurements and observations.  Electronically signed by Delia Heady MD on 12/24/2022 at 10:14:57 AM.    Final     Office note and CT report reviewed.   Assessment/Plan: 4mm left proximal stone.   She has no flank pain at this time.  She has f/u in our office on 01/02/23 that she should keep.    [name].  She had mild hydro on CT.  If the [name] persists, renal US to assess for persistent obstruction could be considered.   Possible syncope.   It would probably be worthwhile to stop tamsulosin since it can impact blood pressure and only has a small impact on stone passage.    Possible UTI.  She has had no fever.  Culture on 5/2 was <10K  and on 4/27 negative so UTI is unlikely.     No follow-ups on file.    CC: Dr. Marinda [name].     Bjorn Pippin 12/24/2022

## 2022-12-25 DIAGNOSIS — W19XXXA Unspecified fall, initial encounter: Secondary | ICD-10-CM | POA: Diagnosis not present

## 2022-12-25 DIAGNOSIS — N179 Acute kidney failure, unspecified: Secondary | ICD-10-CM | POA: Diagnosis not present

## 2022-12-25 DIAGNOSIS — I10 Essential (primary) hypertension: Secondary | ICD-10-CM | POA: Diagnosis not present

## 2022-12-25 DIAGNOSIS — F319 Bipolar disorder, unspecified: Secondary | ICD-10-CM | POA: Diagnosis not present

## 2022-12-25 LAB — BASIC METABOLIC PANEL
Anion gap: 8 (ref 5–15)
BUN: 13 mg/dL (ref 8–23)
CO2: 26 mmol/L (ref 22–32)
Calcium: 9.9 mg/dL (ref 8.9–10.3)
Chloride: 106 mmol/L (ref 98–111)
Creatinine, Ser: 0.99 mg/dL (ref 0.44–1.00)
GFR, Estimated: >60 mL/min (ref >60)
Glucose, Bld: 99 mg/dL (ref 70–99)
Potassium: 4 mmol/L (ref 3.5–5.1)
Sodium: 140 mmol/L (ref 135–145)

## 2022-12-25 MED ORDER — LISINOPRIL 20 MG PO TABS: 20.0000 mg | ORAL_TABLET | Freq: Every day | ORAL | Status: AC

## 2022-12-25 MED ORDER — SERTRALINE HCL 50 MG PO TABS: 50.0000 mg | ORAL_TABLET | Freq: Every day | ORAL | Status: DC

## 2022-12-25 MED ORDER — OXYBUTYNIN CHLORIDE 5 MG PO TABS
5.0000 mg | ORAL_TABLET | ORAL | Status: AC | PRN
  Administered 2022-12-25: 5 mg via ORAL
  Filled 2022-12-25: qty 1

## 2022-12-25 MED ORDER — OXYBUTYNIN CHLORIDE 5 MG PO TABS: 5.0000 mg | ORAL_TABLET | Freq: Two times a day (BID) | ORAL | 0 refills | Status: DC

## 2022-12-25 MED ORDER — OXYBUTYNIN CHLORIDE 5 MG PO TABS
5.0000 mg | ORAL_TABLET | Freq: Two times a day (BID) | ORAL | Status: AC
  Administered 2022-12-25: 5 mg via ORAL
  Filled 2022-12-25: qty 1

## 2022-12-25 NOTE — TOC Transition Note (Signed)
Transition of Care [facility]) - CM/SW Discharge Note   Patient Details  Name: [name] MRN: [id number] Date of Birth: [birth year]  Transition of Care [facility]) CM/SW Contact:  Lanier Clam, RN Phone Number: 12/25/2022, 9:33 AM   Clinical Narrative:  HHPT recc. MD aware to  place order, & face to face. HHPT already set up w/[facility]. No further CM needs.      Final next level of care: Home w Home Health Services Barriers to Discharge: No Barriers Identified   Patient Goals and CMS Choice CMS Medicare.gov Compare Post Acute Care list provided to:: Patient Choice offered to / list presented to : Patient, Spouse  Discharge Placement                         Discharge Plan and Services Additional resources added to the After Visit Summary for   In-house Referral: Clinical Social Work Discharge Planning Services: NA Post Acute Care Choice: Home Health          DME Arranged: N/A DME Agency: NA       HH Arranged: PT HH Agency: [facility] Date HH Agency Contacted: 12/23/22 Time HH Agency Contacted: 1526 Representative spoke with at [facility] Agency: Tresa Endo  Social Determinants of Health (SDOH) Interventions SDOH Screenings   Food Insecurity: No Food Insecurity (12/22/2022)  Housing: Low Risk  (12/22/2022)  Transportation Needs: No Transportation Needs (12/22/2022)  Utilities: Not At Risk (12/22/2022)  Depression (PHQ2-9): Medium Risk (03/17/2021)  Tobacco Use: Low Risk  (12/22/2022)     Readmission Risk Interventions    12/23/2022    3:21 PM  Readmission Risk Prevention Plan  Post Dischage Appt Complete  Medication Screening Complete  Transportation Screening Complete

## 2022-12-25 NOTE — Discharge Summary (Signed)
Physician Discharge Summary  [name] ZOX:[id number] DOB: [birth year] DOA: 12/22/2022  PCP: Irven Coe, MD  Admit date: 12/22/2022 Discharge date: 12/25/2022  Admitted From: Home Disposition:  Home  Recommendations for Outpatient Follow-up:  Follow up with PCP in 1-2 weeks Please obtain BMP/CBC in one week   Home Health:None Equipment/Devices:None  Discharge Condition:Stable CODE STATUS:Full Diet recommendation: Heart Healthy   Brief/Interim Summary:  71 y.o. female past medical history significant for anxiety, bipolar disorder, chronic diastolic heart failure, diabetes mellitus type 2, essential hypertension viral pericarditis, was seen last week in the ED for nephrolithiasis discharged on cephalexin and Flomax and told to follow-up with urology brought into the ED after a fall at home, CT of the head showed no acute findings right knee x-ray showed no fracture or dislocation, CT of the renal stones done last week showed left-sided hydronephrosis with hydroureter with a 4 mm stone close to the UPJ in the ED UA was concerning for infection creatinine 1.6   Discharge Diagnoses:  Principal Problem:   AKI (acute kidney injury) (HCC) Active Problems:   Hyperlipemia, mixed   Essential hypertension   Paroxysmal atrial fibrillation (HCC)   OSA on CPAP   Bipolar 1 disorder, depressed (HCC)   GERD (gastroesophageal reflux disease)   GAD (generalized anxiety disorder)   Class 3 obesity (HCC)   Hydronephrosis of left kidney   Fall  Acute kidney injury: With baseline creatinine less than 1 admission 1.6 in the setting of ARB Lasix and NSAIDs use these were held she will start on IV fluids her creatinine improved. She will follow-up with urology on 01/02/2023 continue be made at that time.  Left hydronephrosis: Will start empirically on IV Rocephin urine culture grew less than 10,000 colonies of bacteria antibiotics were discontinued she remained afebrile she had no  leukocytosis.  Question of syncope: Diuretics were held 2D echo results are pending and will need to be follow-up by PCP as an outpatient. She had no further symptoms in house no dizziness upon standing she was ambulated in the halls. She had no events on telemetry. Her Flomax was discontinued. Physical therapy evaluated the patient recommended home health PT.  Hyperlipidemia: Continue statins.  Essential hypertension: Lisinopril and diuretic therapy were held in the setting of acute kidney injury she was continued on metoprolol and amlodipine. She will resume lisinopril and diuretic as an outpatient no further changes to his medication.  Paroxysmal atrial fibrillation: Not on anticoagulation she remains sinus rhythm continue metoprolol.  Obstructive sleep apnea: She declined CPAP.  Bipolar disorder/general anxiety disorder: No change made to her medication continue lamotrigine and Seroquel.  GERD: Continue PPI.  Morbid obesity: She has been counseled.   Discharge Instructions  Discharge Instructions     Diet - low sodium heart healthy   Complete by: As directed    Increase activity slowly   Complete by: As directed       Allergies as of 12/25/2022       Reactions   Maxzide [triamterene-hctz] Other (See Comments)   Hurt patient's kidneys   Celexa [citalopram Hydrobromide] Nausea Only   Metformin And Related Nausea And Vomiting   Other Nausea And Vomiting, Other (See Comments)   Unnamed medication hurt the patient's kidneys   Reglan [metoclopramide] Nausea Only   Risperdal [risperidone] Nausea Only, Other (See Comments)   Also caused hallucinations   Trazodone And Nefazodone Nausea Only   Zestril [lisinopril] Nausea Only   Lithium Palpitations, Other (See Comments)   Shakes and  delusional pt started seeing things         Medication List     STOP taking these medications    cephALEXin 250 MG capsule Commonly known as: KEFLEX   ibuprofen 200 MG  tablet Commonly known as: ADVIL   meloxicam 15 MG tablet Commonly known as: MOBIC   sulfamethoxazole-trimethoprim 800-160 MG tablet Commonly known as: BACTRIM DS   tamsulosin 0.4 MG Caps capsule Commonly known as: FLOMAX       TAKE these medications    acetaminophen 325 MG tablet Commonly known as: TYLENOL Take 650 mg by mouth every 6 (six) hours as needed for mild pain or headache.   amLODipine 5 MG tablet Commonly known as: NORVASC Take 5 mg by mouth every evening.   aspirin 81 MG tablet Take 81 mg by mouth daily.   famotidine 20 MG tablet Commonly known as: PEPCID Take 20 mg by mouth in the morning and at bedtime.   furosemide 20 MG tablet Commonly known as: LASIX Take 20 mg by mouth daily as needed (for severe swelling).   HYDROcodone-acetaminophen 5-325 MG tablet Commonly known as: NORCO/VICODIN Take 1 tablet by mouth every 4 (four) hours as needed. What changed: reasons to take this   lamoTRIgine 150 MG tablet Commonly known as: LAMICTAL TAKE 1 TABLET TWICE DAILY   levocetirizine 5 MG tablet Commonly known as: XYZAL Take 5 mg by mouth every evening.   levothyroxine 100 MCG tablet Commonly known as: SYNTHROID Take 100 mcg by mouth daily before breakfast.   lisinopril 20 MG tablet Commonly known as: ZESTRIL Take 1 tablet (20 mg total) by mouth daily. Start taking on: Dec 28, 2022 What changed: These instructions start on Dec 28, 2022. If you are unsure what to do until then, ask your doctor or other care provider.   metoprolol succinate 50 MG 24 hr tablet Commonly known as: TOPROL-XL Take 50 mg by mouth daily.   Mounjaro 5 MG/0.5ML Pen Generic drug: tirzepatide Inject 5 mg into the skin once a week.   multivitamin tablet Take 1 tablet by mouth daily.   ondansetron 4 MG disintegrating tablet Commonly known as: ZOFRAN-ODT Take 1 tablet (4 mg total) by mouth every 8 (eight) hours as needed for nausea or vomiting.   oxybutynin 5 MG  tablet Commonly known as: DITROPAN Take 1 tablet (5 mg total) by mouth 2 (two) times daily.   polyethylene glycol 17 g packet Commonly known as: MIRALAX / GLYCOLAX Take 17 g by mouth 2 (two) times daily as needed for mild constipation.   potassium chloride SA 20 MEQ tablet Commonly known as: KLOR-CON M Take 20 mEq by mouth daily.   QUEtiapine 100 MG tablet Commonly known as: SEROQUEL Take 100 mg by mouth at bedtime as needed (Sleep). What changed: Another medication with the same name was changed. Make sure you understand how and when to take each.   QUEtiapine 400 MG 24 hr tablet Commonly known as: SEROQUEL XR 1 at 730PM What changed:  how much to take how to take this when to take this additional instructions   QUEtiapine 300 MG tablet Commonly known as: SEROQUEL TAKE 1 TABLET AT BEDTIME What changed:  how much to take how to take this when to take this additional instructions   sertraline 50 MG tablet Commonly known as: ZOLOFT Take 1 tablet (50 mg total) by mouth daily.   simvastatin 40 MG tablet Commonly known as: ZOCOR Take 1 tablet (40 mg total) by mouth every evening.  Follow-up Information     Sable Feil, PA-C Follow up on 01/02/2023.   Specialty: Urology Why: as scheduled Contact information: [address] [place] [place] [address] [phone]                Allergies  Allergen Reactions   Maxzide [Triamterene-Hctz] Other (See Comments)    Hurt patient's kidneys   Celexa [Citalopram Hydrobromide] Nausea Only   Metformin And Related Nausea And Vomiting   Other Nausea And Vomiting and Other (See Comments)    Unnamed medication hurt the patient's kidneys   Reglan [Metoclopramide] Nausea Only   Risperdal [Risperidone] Nausea Only and Other (See Comments)    Also caused hallucinations   Trazodone And Nefazodone Nausea Only   Zestril [Lisinopril] Nausea Only   Lithium Palpitations and Other (See Comments)    Shakes and  delusional pt started seeing things      Consultations: Urology   Procedures/Studies: VAS US CAROTID  Result Date: 12/24/2022 Carotid Arterial Duplex Study Patient Name:  [name]  Date of Exam:   12/23/2022 Medical Rec #: [id number]        Accession #:    [id number] Date of Birth: Jun 03, 1951        Patient Gender: F Patient Age:   29 years Exam Location:  [facility] Procedure:      VAS US CAROTID Referring Phys: DAVID ORTIZ --------------------------------------------------------------------------------  Indications:       Syncope. Risk Factors:      Hypertension, hyperlipidemia. Limitations        Today's exam was limited due to the body habitus of the                    patient, patient movement and the patient's respiratory                    variation. Comparison Study:  No prior studies. Performing Technologist: Chanda Busing RVT  Examination Guidelines: A complete evaluation includes B-mode imaging, spectral Doppler, color Doppler, and power Doppler as needed of all accessible portions of each vessel. Bilateral testing is considered an integral part of a complete examination. Limited examinations for reoccurring indications may be performed as noted.  Right Carotid Findings: +----------+--------+--------+--------+-----------------------+--------+           PSV cm/sEDV cm/sStenosisPlaque Description     Comments +----------+--------+--------+--------+-----------------------+--------+ CCA Prox  90      12                                              +----------+--------+--------+--------+-----------------------+--------+ CCA Distal75      14              smooth and heterogenous         +----------+--------+--------+--------+-----------------------+--------+ ICA Prox  106     34              smooth and heterogenous         +----------+--------+--------+--------+-----------------------+--------+ ICA Mid   81      26                                               +----------+--------+--------+--------+-----------------------+--------+ ICA Distal74      23  tortuous +----------+--------+--------+--------+-----------------------+--------+ ECA       108     9                                               +----------+--------+--------+--------+-----------------------+--------+ +----------+--------+-------+--------+-------------------+           PSV cm/sEDV cmsDescribeArm Pressure (mmHG) +----------+--------+-------+--------+-------------------+ Subclavian170                                        +----------+--------+-------+--------+-------------------+ +---------+--------+--+--------+--+---------+ VertebralPSV cm/s44EDV cm/s13Antegrade +---------+--------+--+--------+--+---------+  Left Carotid Findings: +----------+--------+--------+--------+-----------------------+--------+           PSV cm/sEDV cm/sStenosisPlaque Description     Comments +----------+--------+--------+--------+-----------------------+--------+ CCA Prox  100     15                                              +----------+--------+--------+--------+-----------------------+--------+ CCA Distal86      14              smooth and heterogenous         +----------+--------+--------+--------+-----------------------+--------+ ICA Prox  105     21              smooth and heterogenous         +----------+--------+--------+--------+-----------------------+--------+ ICA Mid   52      11              smooth and heterogenous         +----------+--------+--------+--------+-----------------------+--------+ ICA Distal64      17                                     tortuous +----------+--------+--------+--------+-----------------------+--------+ ECA       122     12                                              +----------+--------+--------+--------+-----------------------+--------+  +----------+--------+--------+--------+-------------------+           PSV cm/sEDV cm/sDescribeArm Pressure (mmHG) +----------+--------+--------+--------+-------------------+ [id number]                                         +----------+--------+--------+--------+-------------------+ +---------+--------+--+--------+--+---------+ VertebralPSV cm/s42EDV cm/s11Antegrade +---------+--------+--+--------+--+---------+   Summary: Right Carotid: Velocities in the right ICA are consistent with a 1-39% stenosis. Left Carotid: Velocities in the left ICA are consistent with a 1-39% stenosis. Vertebrals: Bilateral vertebral arteries demonstrate antegrade flow. *See table(s) above for measurements and observations.  Electronically signed by Delia Heady MD on 12/24/2022 at 10:14:57 AM.    Final    CT Head Wo Contrast  Result Date: 12/22/2022 CLINICAL DATA:  73 year old female with episode of vertigo, fall, syncope/presyncope. EXAM: CT HEAD WITHOUT CONTRAST TECHNIQUE: Contiguous axial images were obtained from the base of the skull through the vertex without intravenous contrast. RADIATION DOSE REDUCTION: This exam was performed according to the departmental dose-optimization program which includes automated exposure control,  adjustment of the mA and/or kV according to patient size and/or use of iterative reconstruction technique. COMPARISON:  Head CT 04/15/2021. FINDINGS: Brain: Cerebral volume is stable, within normal limits for age. No midline shift, ventriculomegaly, mass effect, evidence of mass lesion, intracranial hemorrhage or evidence of cortically based acute infarction. Gray-white matter differentiation is within normal limits throughout the brain. Vascular: No suspicious intracranial vascular hyperdensity. But extensive calcified atherosclerosis at the skull base. Skull: No acute osseous abnormality identified. Hyperostosis of the calvarium. Sinuses/Orbits: Tympanic cavities, Visualized paranasal  sinuses and mastoids are clear. Other: Stable and negative orbit and scalp soft tissues. IMPRESSION: No acute intracranial abnormality. Stable and negative for age noncontrast Head CT. Electronically Signed   By: Odessa Fleming M.D.   On: 12/22/2022 05:39   DG Knee Complete 4 Views Right  Result Date: 12/22/2022 CLINICAL DATA:  Fall EXAM: RIGHT KNEE - COMPLETE 4+ VIEW COMPARISON:  None Available. FINDINGS: No definitive fracture is seen. Slight lateral deviation of patella. Mild lateral and patellofemoral degenerative change. Positive for knee effusion. Faint joint space calcifications IMPRESSION: No definitive fracture is seen. Slight lateral deviation of patella with knee effusion. Chondrocalcinosis Electronically Signed   By: Jasmine Pang M.D.   On: 12/22/2022 03:16   CT Renal Stone Study  Result Date: 12/17/2022 CLINICAL DATA:  Flank pain with nausea EXAM: CT ABDOMEN AND PELVIS WITHOUT CONTRAST TECHNIQUE: Multidetector CT imaging of the abdomen and pelvis was performed following the standard protocol without IV contrast. RADIATION DOSE REDUCTION: This exam was performed according to the departmental dose-optimization program which includes automated exposure control, adjustment of the mA and/or kV according to patient size and/or use of iterative reconstruction technique. COMPARISON:  CT 03/23/2016 FINDINGS: Lower chest: Lung bases are clear.  Coronary vascular calcification. Hepatobiliary: No focal liver abnormality is seen. Status post cholecystectomy. No biliary dilatation. Pancreas: Unremarkable. No pancreatic ductal dilatation or surrounding inflammatory changes. Spleen: Normal in size without focal abnormality. Adrenals/Urinary Tract: Adrenal glands are normal. Left perinephric fat stranding with mild hydronephrosis, secondary to a 4 mm stone in the proximal left ureter about 2.5 cm distal to the UPJ. The bladder is unremarkable. Stomach/Bowel: The stomach is nonenlarged. No dilated small bowel. No acute  bowel wall thickening. Diverticular disease of the left colon. Vascular/Lymphatic: Moderate aortic atherosclerosis. No aneurysm. Retroaortic left renal vein. No suspicious lymph nodes Reproductive: Status post hysterectomy. No adnexal masses. Other: Negative for pelvic effusion or free air Musculoskeletal: No acute or suspicious osseous abnormality IMPRESSION: 1. Mild left hydronephrosis and hydroureter, secondary to a 4 mm stone in the proximal left ureter about 2.5 cm distal to the UPJ. 2. Diverticular disease of the left colon without acute inflammatory process. 3. Aortic atherosclerosis. Aortic Atherosclerosis (ICD10-I70.0). Electronically Signed   By: Jasmine Pang M.D.   On: 12/17/2022 23:08   (Echo, Carotid, EGD, Colonoscopy, ERCP)    Subjective: No complaints  Discharge Exam: Vitals:   12/24/22 1949 12/25/22 0514  BP: (!) 157/75 138/65  Pulse: 70 63  Resp: 18 18  Temp: 97.7 F (36.5 C) 97.6 F (36.4 C)  SpO2: 98% 97%   Vitals:   12/24/22 0554 12/24/22 1312 12/24/22 1949 12/25/22 0514  BP:  (!) 145/68 (!) 157/75 138/65  Pulse:  70 70 63  Resp:  18 18 18   Temp:  98 F (36.7 C) 97.7 F (36.5 C) 97.6 F (36.4 C)  TempSrc:  Oral Oral Oral  SpO2:  100% 98% 97%  Weight: 113.9 kg   111.7 kg  Height:        General: Pt is alert, awake, not in acute distress Cardiovascular: RRR, S1/S2 +, no rubs, no gallops Respiratory: CTA bilaterally, no wheezing, no rhonchi Abdominal: Soft, NT, ND, bowel sounds + Extremities: no edema, no cyanosis    The results of significant diagnostics from this hospitalization (including imaging, microbiology, ancillary and laboratory) are listed below for reference.     Microbiology: Recent Results (from the past 240 hour(s))  Urine Culture     Status: None   Collection Time: 12/17/22  7:53 PM   Specimen: Urine, Clean Catch  Result Value Ref Range Status   Specimen Description   Final    URINE, CLEAN CATCH Performed at [facility], [address], [place], [place] [address]    Special Requests   Final    NONE Performed at [facility], [address], [place], [place] [address]    Culture   Final    NO GROWTH Performed at [facility], [name], [place], [place] [address]    Report Status 12/19/2022 FINAL  Final  Urine Culture     Status: Abnormal   Collection Time: 12/22/22  4:55 AM   Specimen: Urine, Clean Catch  Result Value Ref Range Status   Specimen Description   Final    URINE, CLEAN CATCH Performed at [facility], [address], [place], [place] [address]    Special Requests   Final    NONE Performed at [facility], [address], [place], [place] [address]    Culture (A)  Final    <10,000 COLONIES/mL INSIGNIFICANT GROWTH Performed at [facility], [address], [place], [place] [address]    Report Status 12/23/2022 FINAL  Final     Labs: BNP (last 3 results) No results for input(s): "BNP" in the last 8760 hours. Basic Metabolic Panel: Recent Labs  Lab 12/22/22 0300 12/23/22 1106  NA 136 140  K 4.1 4.4  CL 101 109  CO2 24 25  GLUCOSE 136* 146*  BUN 21 14  CREATININE 1.60* 1.55*  CALCIUM 10.4* 9.9   Liver Function Tests: Recent Labs  Lab 12/22/22 0300  AST 19  ALT 15  ALKPHOS 76  BILITOT 0.7  PROT 6.8  ALBUMIN 3.6   No results for input(s): "LIPASE", "AMYLASE" in the last 168 hours. No results for input(s): "AMMONIA" in the last 168 hours. CBC: Recent Labs  Lab 12/22/22 0300  WBC 7.4  HGB 13.1  HCT 38.8  MCV 93.0  PLT 204   Cardiac Enzymes: No results for input(s): "CKTOTAL", "CKMB", "CKMBINDEX", "TROPONINI" in the last 168 hours. BNP: Invalid input(s): "POCBNP" CBG: Recent Labs  Lab 12/22/22 0251  GLUCAP 122*   D-Dimer No results for input(s): "DDIMER" in the last 72 hours. Hgb A1c No results for input(s): "HGBA1C" in the last 72 hours. Lipid Profile No results  for input(s): "CHOL", "HDL", "LDLCALC", "TRIG", "CHOLHDL", "LDLDIRECT" in the last 72 hours. Thyroid function studies No results for input(s): "TSH", "T4TOTAL", "T3FREE", "THYROIDAB" in the last 72 hours.  Invalid input(s): "FREET3" Anemia work up No results for input(s): "VITAMINB12", "FOLATE", "FERRITIN", "TIBC", "IRON", "RETICCTPCT" in the last 72 hours. Urinalysis    Component Value Date/Time   COLORURINE YELLOW 12/22/2022 0455   APPEARANCEUR CLEAR 12/22/2022 0455   LABSPEC 1.014 12/22/2022 0455   PHURINE 6.0 12/22/2022 0455   GLUCOSEU NEGATIVE 12/22/2022 0455   HGBUR MODERATE (A) 12/22/2022  0455   BILIRUBINUR NEGATIVE 12/22/2022 0455   KETONESUR NEGATIVE 12/22/2022 0455   PROTEINUR NEGATIVE 12/22/2022 0455   UROBILINOGEN 0.2 01/08/2014 0846   NITRITE NEGATIVE 12/22/2022 0455   LEUKOCYTESUR LARGE (A) 12/22/2022 0455   Sepsis Labs Recent Labs  Lab 12/22/22 0300  WBC 7.4   Microbiology Recent Results (from the past 240 hour(s))  Urine Culture     Status: None   Collection Time: 12/17/22  7:53 PM   Specimen: Urine, Clean Catch  Result Value Ref Range Status   Specimen Description   Final    URINE, CLEAN CATCH Performed at [facility], [address] [place], [place] [address]    Special Requests   Final    NONE Performed at [facility], [address], [place], [place] [address]    Culture   Final    NO GROWTH Performed at [facility], [address], [place], [place] [address]    Report Status 12/19/2022 FINAL  Final  Urine Culture     Status: Abnormal   Collection Time: 12/22/22  4:55 AM   Specimen: Urine, Clean Catch  Result Value Ref Range Status   Specimen Description   Final    URINE, CLEAN CATCH Performed at [facility], [address] [place], [place] [address]    Special Requests   Final    NONE Performed at [facility], [address], [place], [place]  [address]    Culture (A)  Final    <10,000 COLONIES/mL INSIGNIFICANT GROWTH Performed at [facility], [address], [place], [place] [address]    Report Status 12/23/2022 FINAL  Final    SIGNED:   Marinda Elk, MD  [facility] 12/25/2022, 8:40 AM Pager   If 7PM-7AM, please contact night-coverage [email or web address] Password [email or web address]

## 2022-12-26 LAB — ECHOCARDIOGRAM COMPLETE
AR max vel: 2.26 cm2
AV Area VTI: 2.24 cm2
AV Area mean vel: 1.98 cm2
AV Mean grad: 12 mmHg
AV Peak grad: 22.5 mmHg
Ao pk vel: 2.37 m/s
Area-P 1/2: 3.48 cm2
Height: 65 in
S' Lateral: 2.4 cm

## 2023-01-09 ENCOUNTER — Telehealth: Payer: Self-pay | Admitting: Psychiatry

## 2023-01-09 DIAGNOSIS — F3164 Bipolar disorder, current episode mixed, severe, with psychotic features: Secondary | ICD-10-CM

## 2023-01-09 MED ORDER — QUETIAPINE FUMARATE ER 400 MG PO TB24
ORAL_TABLET | ORAL | 1 refills | Status: DC
Start: 2023-01-09 — End: 2023-05-03

## 2023-01-09 NOTE — Telephone Encounter (Signed)
Sent!

## 2023-01-09 NOTE — Telephone Encounter (Signed)
Husband Fayrene Fearing called Pt needs Rx for Quetiapine 400 mg ER to [facility]. Apt 6/27

## 2023-01-12 DIAGNOSIS — R310 Gross hematuria: Secondary | ICD-10-CM | POA: Diagnosis not present

## 2023-01-12 DIAGNOSIS — N201 Calculus of ureter: Secondary | ICD-10-CM | POA: Diagnosis not present

## 2023-01-18 DIAGNOSIS — Z6841 Body Mass Index (BMI) 40.0 and over, adult: Secondary | ICD-10-CM | POA: Diagnosis not present

## 2023-01-18 DIAGNOSIS — E119 Type 2 diabetes mellitus without complications: Secondary | ICD-10-CM | POA: Diagnosis not present

## 2023-01-18 DIAGNOSIS — E1129 Type 2 diabetes mellitus with other diabetic kidney complication: Secondary | ICD-10-CM | POA: Diagnosis not present

## 2023-01-18 DIAGNOSIS — N200 Calculus of kidney: Secondary | ICD-10-CM | POA: Diagnosis not present

## 2023-01-18 DIAGNOSIS — I77810 Thoracic aortic ectasia: Secondary | ICD-10-CM | POA: Diagnosis not present

## 2023-02-08 DIAGNOSIS — N170 Acute kidney failure with tubular necrosis: Secondary | ICD-10-CM | POA: Diagnosis not present

## 2023-02-14 ENCOUNTER — Telehealth: Payer: Self-pay | Admitting: Psychiatry

## 2023-02-14 MED ORDER — SERTRALINE HCL 50 MG PO TABS: 50.0000 mg | ORAL_TABLET | Freq: Every day | ORAL | 0 refills | Status: DC

## 2023-02-14 NOTE — Telephone Encounter (Signed)
Pt requesting Rx for  Sertraline 50 mg  1/d  #90 to [facility]. Apt 8/21. Will be out in a week

## 2023-02-14 NOTE — Telephone Encounter (Signed)
Sent!

## 2023-02-15 ENCOUNTER — Other Ambulatory Visit: Payer: Self-pay | Admitting: Psychiatry

## 2023-02-16 ENCOUNTER — Telehealth: Payer: Medicare HMO | Admitting: Psychiatry

## 2023-03-02 DIAGNOSIS — N201 Calculus of ureter: Secondary | ICD-10-CM | POA: Diagnosis not present

## 2023-03-03 ENCOUNTER — Encounter: Payer: Self-pay | Admitting: Psychiatry

## 2023-03-03 ENCOUNTER — Ambulatory Visit (INDEPENDENT_AMBULATORY_CARE_PROVIDER_SITE_OTHER): Payer: Medicare HMO | Admitting: Psychiatry

## 2023-03-03 DIAGNOSIS — F411 Generalized anxiety disorder: Secondary | ICD-10-CM | POA: Diagnosis not present

## 2023-03-03 DIAGNOSIS — F5105 Insomnia due to other mental disorder: Secondary | ICD-10-CM | POA: Diagnosis not present

## 2023-03-03 DIAGNOSIS — R7989 Other specified abnormal findings of blood chemistry: Secondary | ICD-10-CM

## 2023-03-03 DIAGNOSIS — F3164 Bipolar disorder, current episode mixed, severe, with psychotic features: Secondary | ICD-10-CM

## 2023-03-03 DIAGNOSIS — F40240 Claustrophobia: Secondary | ICD-10-CM

## 2023-03-03 DIAGNOSIS — F4001 Agoraphobia with panic disorder: Secondary | ICD-10-CM | POA: Diagnosis not present

## 2023-03-03 DIAGNOSIS — G3184 Mild cognitive impairment, so stated: Secondary | ICD-10-CM

## 2023-03-03 MED ORDER — SERTRALINE HCL 50 MG PO TABS
50.0000 mg | ORAL_TABLET | Freq: Every day | ORAL | 1 refills | Status: DC
Start: 2023-03-03 — End: 2023-05-03

## 2023-03-03 NOTE — Progress Notes (Signed)
[name] [id number] Sep 01, 1950 72 y.o.   Virtual Visit via Telephone Note  I connected with pt by telephone and verified that I am speaking with the correct person using two identifiers.   I discussed the limitations, risks, security and privacy concerns of performing an evaluation and management service by telephone and the availability of in person appointments. I also discussed with the patient that there may be a patient responsible charge related to this service. The patient expressed understanding and agreed to proceed.  I discussed the assessment and treatment plan with the patient. The patient was provided an opportunity to ask questions and all were answered. The patient agreed with the plan and demonstrated an understanding of the instructions.   The patient was advised to call back or seek an in-person evaluation if the symptoms worsen or if the condition fails to improve as anticipated.  I provided 30 minutes of non-face-to-face time during this encounter. . The patient was located at home and the provider was located office. Session from 330p-400p  Subjective:   Patient ID:  [name] is a 72 y.o. (DOB 05-12-1951) female.  Chief Complaint:  Chief Complaint  Patient presents with   Follow-up   Depression   Anxiety   Sleeping Problem    Anxiety Symptoms include decreased concentration and dizziness. Patient reports no chest pain, confusion, nervous/anxious behavior, palpitations or suicidal ideas.        [name] presents to the office today for follow-up of anxiety and depression and poor cognition.  At her visit October 12, 2018.  Because of her poor cognition which did seem to get even worse lately with her memory and given a negative unremarkable medical work-up by her primary care doctor the decision was made to change her from Xanax which she has been on for years to lorazepam.  Lorazepam often has less cognitive side effects than does  alprazolam.  She had a lot of difficulty with headaches and insomnia with the transition.  She called several times in that transition.  Her husband reported that her cognition was better after the transition however.  She was satisfied with the Ativan.  There was an attempt to reduce her lamotrigine to 150 mg also in hopes of improving cognition but it was uncertain at the last visit where there she had actually done so.  visit December 2020 without med changes.  She had continued to do cognitively better off alprazolam and on lorazepam.  seen March 2021.   Better than December visit.  Feeling good and less down.  Adjusting time of med helped excessively sleep.  Brief hypomanic episode resolved where she cleaned all night.  Sleep 10 hours . No med changes.  12/23/19 appt.  Noted: More depression, crying and racing thoughts and distressed to the point of wondering if needed the hospital.  Sleep got worse and needed Seroquel IR 200 also bc went 2 days without sleep.  It worked and helped sleep.  Worry a lot and melancholy this week.  Cries over what happens dealing with her daugher.[name] has prostate and kidney cancer again.  Had surgery June 10, 2019 and she's cried a lot.  He's incontinent and very uncomfortable.  He usually has positive attitude.   Worry over his health and how she'll do if something happens to him.  She's afraid of Covid.  Trusting in God usually.  She realizes she  Needs to drive occasionally bc of his health.  [name]'s father had  prostate CA and lived to 79 yo.  Uncle died of it.  Not markedly depressed. Can't walk much dT plantar fascitis. Also stressed over [name] 72 yo not doing school work and causing problems.  Her father [name] is a bad parent and not helpful.  Stressed over [employer] and downs too and they have periodic conflict.   More down and anxious.   Guilt tendency.   Patient reports difficulty with sleep initiation or maintenance in part DT anxiety and racing  thoughts.Intermittent sleep problems with days and nights mixed up.  Not napping. Not manic.[name] Denies appetite disturbance.  Patient reports that energy and motivation have been good.  Patient has some difficulty with concentration.  Chronic memory problems.   Patient denies any suicidal ideation. Plan:   No changes in meds indicated except agree to take the extra Seroquel 200 IR HS for mixed manic sx that are worse right now.  May be having mixed seasonal sx.  02/20/2020 appointment with the following noted: Doing fairly good except chronic worry over [name].  Some days cries a lot over it.  D having a hard time lately.  Still easily stressed but not more than usual. Stopped extra quetiapine 200 mg HS bc no longer needed it. Anxiety is chronic but at baseline as is depression and not manic now. H helps her take the meds.   Memory is still bad but has been worse when on Xanax instead fo Ativan.  Sleep is fine from 10 to 9 which is much better than in the past.  Occ spells of staying up for 2 days and then needs to take quetiapine.   H [name] goes for FU soon for cancer check up. Plan: no med changes  05/21/20 appt with following noted: "A lot of problems".  Can't drive bc fear and anxiety.  Needed to drive H and couldn't. Fall off toilet twice. Dizzy and HA 2 days ago with a lot of crying and stress with daughter. Leaving home even coming here causes anxiety.    Cataract surgery twice. Fear of [name] being diagnosed with recurrent cancer. Chronic anxiety greather than depression.   Don't know what to do with daughter. Sleep, appetite and energy are OK. Tolerating meds. Sees GD [name] one day weekly. H helps with meds bc she's forgetful and easily confused.  Loses train of thoughts. Dog has separation anxiety and so they don't go out much.  Plan: no med changes  08/18/20 appt with following noted: 2 ER visitis with vertigo.  RX diazepam and meclizine. Rarely takes former but takes latter  regularly. Shakes so bad it made her cry and scream.  Started PT. Continues lorazepam low dose 0.5 mg AM and 1.0 mg HS.  Still has dizziness and vertigo. Memory is still bad and H administers meds with pill box. Chronic anxiety.  Depression manageable. Sleep OK and tolerates meds otherwise. Angelica Chessman driving her crazy. Plan: no med changes  11/17/20 appt with following noted: Has had periods of vertigo and meds for it.  Then had manic sx with cleaning and couldn't sleep for 24 hours. Stopped quetiapine 200 and continued quetiapine XR 800. Also started diazepam 5 mg BID for vertigo and continued lorazepam for anxiety.  No meclizine now.  Vertigo stopped and manic sx stopped.  Tolerating meds now. Still feels Angelica Chessman is mean to her too often and she will cry over it. Mandy's ExH [name] in jail and is the father of Mandy's D [name] 70 yo.  Will be there for 3 mos. Not markedly depressed.  But hates herself some days.  Still memory problems about the same.  Sleeping OK now.  Periods of mood swings. Easily anxious with relatives visiting.   Plan: Mixed manic sx resolved for now. No med changes today.  02/17/2021 appointment with the following noted: Not good.  "A whole lof of everything."  Shaking for mos. It comes and goes.  Shaking at times makes her think she'll have a nervous breakdown.   Went to ER and dx vertigo.  Has tried meclizine and Valium but scared to take it.  Big shakes again today.  Memory is not good. Sleep to escape anxiety.  Scared to leave home. Not sure what brings on the spells of shaking. Today sx hit her as soon as she awoke.  Plan: No med changes  03/12/2021 phone call: Patient with a lot of anxiety.  Husband reported the patient had been hallucinating and was paranoid that he had left her.  Some confusion over identity of people that were around her.  It was suggested that she have medical work-up because it sounded like she had delirium and may have a UTI causing  that. 03/17/2021 psychiatric hospitalization. 03/22/2021 MD response:  Note Patient has been very unstable with bipolar disorder mixed symptoms lately also with some delirium of unknown cause.  She will be seeing her primary care physician tomorrow.  Due to the severity of her symptoms it is suggested that she add haloperidol 2 mg nightly to her current quetiapine 800 mg nightly since we cannot increase the quetiapine dose further.  This may help with her confusion and agitation.  It is okay to talk with her husband because he administers her medication and because she is too confused to understand the instructions.     04/16/21 H called with following:  Pt.'s husband called.  She is currently in [facility] waiting for an inpatient pyschiatric bed.  Pt advised her husband that Dr Jennelle Human told her to stop taking the Lorazepam.  He said he is not aware of that chang in her prescription.  He would like someone to call him and advise if there were any changes to her meds so he stays aware and can let the hospital know if needed. Husband was informed we had not made any medication changes and would not have suggested she abruptly stopped lorazepam which could cause withdrawal and other kinds of problems.  05/11/2021 appointment with the following noted:  spoke with H for info At hospital was taken off Ativan, buspirone, and Seroquel reduced to 500 mg HS (as 1 of XR 400 mg  and 1/2 of 200 mg IR quetiapine).  Had sedative SE at 200 mg queiapinte !R.  Off risperidone and haloperidol.   Hospitalized at [facility] for 9 days.  They didn't feel communication was good with them. But she is better now than she was.  Had been psychotic PTH.  Last 2-3 days getting more hyper and can't wind down and hard time going to sleep.  Came home 05/03/21. She didn't like the restrictions at the hospital.  No panic lately and wants something to help her sleep. Plan; Mixed manic sx including psychosis recently but not  psychotic now but insomnia.. Will get more psychotic if she doesn't sleep but apparently didn't tolerate Seroquel IR 200 mg HS well so will increase to quetiapine IR 150 mg HS and XR 400 mg nightly. Continue lamotrigine.    06/11/21  appt:  H [name]. H thinks she's doing goood. Doing very fine.  No trouble sleeping.  Sometimes in morning feels sad bc she knows she will have racing thoughts, but it resolves after a little while.  Racing thoughts since hospital.  Worries over money chronically. To bed 1015-7:30.  Maybe longer which is normal. H administers meds.  Some wordfinding problems.  Does journal which helps. No SE with meds.  Yesterday was hard with anxiety but today is better.  On lamotrigine 150 BID, SERoquel XR 400 + Seroquel IR 150 mg HS. Plan: Much better but not fully resolved.  Mixed manic sx including psychosis recently but not psychotic but insomnia last visit resolved with the following... apparently didn't tolerate Seroquel IR 200 mg HS well so increased quetiapine IR 150 mg HS and XR 400 mg nightly and now she is sleeping but still having some racing thoughts. Continue lamotrigine 150 BID.    07/14/2021 appointment with the following noted: Bad day yesterday feeling very depressed without anxiety. Not sleeping well lately.  Thinks it's related to less Seroquel than in the past.  Some EMA.  Always normal slept a lot. Compulsive cleaning and everything in it's place.  Arranges soap dispensers. Cleaning in the middle of the night. No recent psychotic sx but still racing thoughts and chronic $ worries. Still afraid to leave home.  09/20/2021 appt noted: Great overall.  Occ insomnia with EMA.  Still worry but tries not let it consume me.  Still agoraphobia and hates to go out to dentists and doctors.  Challenge to go to dentist.  Can still dread things. Brief nap in daytimes. No SE H administers meds. Seroquel 300 mg HS, XR 400 mg HS.  Lamotrigine 150 Patient reports [name] mood  and denies depressed or irritable moods.   Patient denies difficulty with sleep initiation or maintenance. Denies appetite disturbance.  Patient reports that energy and motivation have been good.  Patient denies any difficulty with concentration.  Patient denies any suicidal ideation.  01/20/22 appt noted: also talked with [name] Doesn't like waiting 4 mos between appts. Was manic before increasing Seroquel with irritability, not sleeping a couple of days and excessive cleaning. Seroquel increased to IR 400 mg HS.  No SE I feel much Better now.  Doing better at time with anxiety.   Chronic worry over D and GD. GD with psych problems. Sleeping good again. Racing thoughts are better but not gone Tolerating meds.   No further changes desired.   Plan: Seroquel IR 400 mg HS well and XR 400 mg nightly abc recent manic sx better with increase dose. Continue lamotrigine 150 mg twice daily  04/28/2022 appointment noted: Trouble sleeping without extra 100 mg quetiapine HS and takes XR 400 mg and IR 300 mg at 730 pm.  Otherwise will be up until 3 AM.  Sleep from 11 until 830.  Good unless anxious. Doing fairly well except 2 weeks bad spell from July 31 related to [name] receiving call from woman he worked with 8 years ago and triggered fear ans suspiciousness and insecurity and overwhelmed her.  She cried and over reacted.  Rough 2 weeks but otherwise ok. Real worried about [name] who is self cutting.  Seeing therapist and psychiatrist.  Her father is terrible and inattentive.  She is 70 yo and never happy. I still have agoraphobia and doesn't want to go ut places.  Her dog also has severe anxiety and doesn't like to be alone too.  Still fearul  about money.  08/03/22 appt noted: Doing pretty well overall.  Chronic worry over money.  Met old friend on Facebook.  She would contact Marylu Lund a lot.  Was making her sad.  Would cry every time the woman called.  ,This was wearing her out bc woman was hyperverbal.  Cut the  woman off.  Since stopped talking to her then no longer crying. When starts to get dark then racing thoughts.  Driving me crazy.  Chronic worry .  Chronic memorhy problems and tendency to be obsessed over fear of dementia. Irritable and confused.  Might nap 30 min and rest her mind and awakens feeling better. Continues the same meds: lamotrigine and quetiapine. Gained 20# last year. Plan: Have pushed Seroquel  as far as possible so start another mood stabilizer Trileptal and increase to 10 mg AM and 300 mg pM  08/09/2022 phone call: She took oxcarbazepine 150 mg Friday and Saturday and developed severe abdominal pain, bowel and bladder incontinence, and impaired vision.  She skipped it on Sunday and symptoms resolved.  She took it again yesterday and the side effects returned.  States it did help with the racing thoughts but she is unable to tolerate it.  That was just on half of oxcarbazepine tablet. MD response: DC oxcarbazepine due to side effects.  10/17/22 appt noted: Devoria Glassing today. Still racing thoughts and cleaning a lot.  More anxiety than depression.  Rough road.  Worries about everything.   Thoughts jump from one thing to another.  Can't get away from it.  Can't focus.  Hard to take instruction bc of this.    12/20/22 appt noted: Not good.  Kidney stone. More anxiety last couple of mos. Get something on her mind and she cannot get it off.  Obsesses on things that don't matter.  Trouble going to sleep.  Couldn't go into store the other day dT panic and agoraphobia.   Occ dep but nothing to worry about.  More panic.   Lost 15 # on [place].   Plan start sertraline for anxiety  03/03/23 appt noted: TC Been hosp for kidney px kidney stone.  Glad to be over it.  .   Lost 42 # since April 1 with Monjauro.  Eating better.   Anxiety is better with sertraline 50 mg marked benefit with very little anxiety.  Much happier and less crying.  Better acceptance and self talk better than ever  before.   Still some dep which interferes with function at times but not as severe. I'm doing ok.   Tolerating psych meds without problems.   Prior psychiatric medication trials include  paroxetine, Lexapro,  Sertraline 50 daily.   risperidone 4 mg which was sedating, aripiprazole,  perphenazine,  Seroquel 1000, olanzapine for anxiety, lithium which was not tolerated even at very low dosages.  Topamax for anxiety, Oxcarbazepine 150 mg bowel and bladder incontinence and impaired vision. She was intolerant of both Namenda and Aricept.   Lamotrigine 300.   Xanax, Ativan, diazepam 5 mg BID   This is not an exhaustive list.   Psych hospitalization 04/2021 at [facility] for psychosis.  At the visit in February 2020 Calynn was more acutely confused recently for no clear reason.   She had a negative medical work-up for causes of short-term memory problems and confusion.  Therefore the decision was made to try switching her from alprazolam to lorazepam at the lowest possible dose in hopes of improving her cognition.  This was successful  and her cognition was improved significantly and her husband verified this.    [name] seeing Dr. Tonny Bollman   Review of Systems:  Review of Systems  Eyes:  Positive for visual disturbance.  Cardiovascular:  Negative for chest pain and palpitations.  Musculoskeletal:  Positive for arthralgias, back pain and gait problem.  Neurological:  Positive for dizziness. Negative for weakness.  Psychiatric/Behavioral:  Positive for decreased concentration, dysphoric mood and sleep disturbance. Negative for agitation, behavioral problems, confusion, hallucinations, self-injury and suicidal ideas. The patient is not nervous/anxious and is not hyperactive.     Medications: I have reviewed the patient's current medications.  Current Outpatient Medications  Medication Sig Dispense Refill   acetaminophen (TYLENOL) 325 MG tablet Take 650 mg by mouth every 6 (six) hours as  needed for mild pain or headache.     amLODipine (NORVASC) 5 MG tablet Take 5 mg by mouth every evening.     aspirin 81 MG tablet Take 81 mg by mouth daily.     famotidine (PEPCID) 20 MG tablet Take 20 mg by mouth in the morning and at bedtime.     furosemide (LASIX) 20 MG tablet Take 20 mg by mouth daily as needed (for severe swelling).     HYDROcodone-acetaminophen (NORCO/VICODIN) 5-325 MG tablet Take 1 tablet by mouth every 4 (four) hours as needed. (Patient taking differently: Take 1 tablet by mouth every 4 (four) hours as needed for moderate pain or severe pain.) 10 tablet 0   lamoTRIgine (LAMICTAL) 150 MG tablet TAKE 1 TABLET TWICE DAILY 180 tablet 3   levocetirizine (XYZAL) 5 MG tablet Take 5 mg by mouth every evening.     levothyroxine (SYNTHROID) 100 MCG tablet Take 100 mcg by mouth daily before breakfast.     lisinopril (ZESTRIL) 20 MG tablet Take 1 tablet (20 mg total) by mouth daily.     metoprolol succinate (TOPROL-XL) 50 MG 24 hr tablet Take 50 mg by mouth daily.     MOUNJARO 5 MG/0.5ML Pen Inject 5 mg into the skin once a week.     Multiple Vitamin (MULTIVITAMIN) tablet Take 1 tablet by mouth daily.     ondansetron (ZOFRAN-ODT) 4 MG disintegrating tablet Take 1 tablet (4 mg total) by mouth every 8 (eight) hours as needed for nausea or vomiting. 12 tablet 0   oxybutynin (DITROPAN) 5 MG tablet Take 1 tablet (5 mg total) by mouth 2 (two) times daily. 60 tablet 0   polyethylene glycol (MIRALAX / GLYCOLAX) packet Take 17 g by mouth 2 (two) times daily as needed for mild constipation.     potassium chloride SA (K-DUR,KLOR-CON) 20 MEQ tablet Take 20 mEq by mouth daily.     QUEtiapine (SEROQUEL XR) 400 MG 24 hr tablet 1 at 730PM 90 tablet 1   QUEtiapine (SEROQUEL) 100 MG tablet Take 100 mg by mouth at bedtime as needed (Sleep).     QUEtiapine (SEROQUEL) 300 MG tablet TAKE 1 TABLET AT BEDTIME (Patient taking differently: Take 300 mg by mouth at bedtime.) 90 tablet 10   simvastatin (ZOCOR)  40 MG tablet Take 1 tablet (40 mg total) by mouth every evening. 30 tablet 0   sertraline (ZOLOFT) 50 MG tablet Take 1 tablet (50 mg total) by mouth daily. 90 tablet 1   No current facility-administered medications for this visit.    Medication Side Effects: None  Allergies:  Allergies  Allergen Reactions   Maxzide [Triamterene-Hctz] Other (See Comments)    Hurt patient's kidneys   Celexa [  Citalopram Hydrobromide] Nausea Only   Metformin And Related Nausea And Vomiting   Other Nausea And Vomiting and Other (See Comments)    Unnamed medication hurt the patient's kidneys   Reglan [Metoclopramide] Nausea Only   Risperdal [Risperidone] Nausea Only and Other (See Comments)    Also caused hallucinations   Trazodone And Nefazodone Nausea Only   Zestril [Lisinopril] Nausea Only   Lithium Palpitations and Other (See Comments)    Shakes and delusional pt started seeing things      Past Medical History:  Diagnosis Date   Anxiety    Arthritis    Asthma    Bipolar 1 disorder (HCC)    CHF (congestive heart failure) (HCC)    Complication of anesthesia    left a bad taste in my mouth it has lasted since january    Depression    Diabetes mellitus    Hernia    umbilical   Hyperlipidemia    Hypertension    Hypothyroidism    Pericarditis, viral 2010 October   Sleep apnea    uses cpap setting of 15    Family History  Problem Relation Age of Onset   Diabetes Mother    Hypertension Mother    Hyperlipidemia Mother    Diabetes Father    Hypertension Father    Hyperlipidemia Father    Hypertension Sister     Social History   Socioeconomic History   Marital status: Married    Spouse name: Not on file   Number of children: Not on file   Years of education: Not on file   Highest education level: Not on file  Occupational History   Not on file  Tobacco Use   Smoking status: Never   Smokeless tobacco: Never  Substance and Sexual Activity   Alcohol use: No   Drug use: No    Sexual activity: Not on file  Other Topics Concern   Not on file  Social History Narrative   Not on file   Social Determinants of Health   Financial Resource Strain: Not on file  Food Insecurity: No Food Insecurity (12/22/2022)   Hunger Vital Sign    Worried About Running Out of Food in the Last Year: Never true    Ran Out of Food in the Last Year: Never true  Transportation Needs: No Transportation Needs (12/22/2022)   PRAPARE - Administrator, Civil Service (Medical): No    Lack of Transportation (Non-Medical): No  Physical Activity: Not on file  Stress: Not on file  Social Connections: Not on file  Intimate Partner Violence: Not At Risk (12/22/2022)   Humiliation, Afraid, Rape, and Kick questionnaire    Fear of Current or Ex-Partner: No    Emotionally Abused: No    Physically Abused: No    Sexually Abused: No    Past Medical History, Surgical history, Social history, and Family history were reviewed and updated as appropriate.   Please see review of systems for further details on the patient's review from today.   Objective:   Physical Exam:  There were no vitals taken for this visit.  Physical Exam Neurological:     Mental Status: She is alert and oriented to person, place, and time.     Cranial Nerves: No dysarthria.  Psychiatric:        Attention and Perception: Perception normal. She is inattentive.        Mood and Affect: Mood is anxious. Mood is not depressed. Affect is  not labile or tearful.        Speech: Speech is not rapid and pressured or slurred.        Behavior: Behavior is cooperative.        Thought Content: Thought content normal. Thought content is not paranoid or delusional. Thought content does not include homicidal or suicidal ideation. Thought content does not include suicidal plan.        Cognition and Memory: Cognition normal. She exhibits impaired recent memory.        Judgment: Judgment normal.     Comments: Insight intact     Lab  Review:     Component Value Date/Time   NA 140 12/25/2022 0719   K 4.0 12/25/2022 0719   CL 106 12/25/2022 0719   CO2 26 12/25/2022 0719   GLUCOSE 99 12/25/2022 0719   GLUCOSE 154 (H) 09/01/2006 1100   BUN 13 12/25/2022 0719   CREATININE 0.99 12/25/2022 0719   CREATININE 0.93 07/19/2011 1110   CALCIUM 9.9 12/25/2022 0719   PROT 6.8 12/22/2022 0300   ALBUMIN 3.6 12/22/2022 0300   AST 19 12/22/2022 0300   ALT 15 12/22/2022 0300   ALKPHOS 76 12/22/2022 0300   BILITOT 0.7 12/22/2022 0300   GFRNONAA >60 12/25/2022 0719   GFRAA >60 03/23/2016 1933       Component Value Date/Time   WBC 7.4 12/22/2022 0300   RBC 4.17 12/22/2022 0300   HGB 13.1 12/22/2022 0300   HCT 38.8 12/22/2022 0300   PLT 204 12/22/2022 0300   MCV 93.0 12/22/2022 0300   MCH 31.4 12/22/2022 0300   MCHC 33.8 12/22/2022 0300   RDW 13.6 12/22/2022 0300   LYMPHSABS 1.2 04/15/2021 1831   MONOABS 0.4 04/15/2021 1831   EOSABS 0.1 04/15/2021 1831   BASOSABS 0.1 04/15/2021 1831   Prior lamotrigine level in 2018 on this dosage was 3.6.  Not particularly high.  Per Dr. Delaine Lame: Clinical Impressions: Cognitive complaints are likely due to underlying psychiatric disorder (bipolar disorder/anxiety disorder with racing thoughts). Diagnostic impressions based on test performances are limited due to the patient's severe inability to fully attend to the tasks. However, based on clinical presentation, I highly doubt the presence of a neurodegenerative dementia. It is much more likely that her racing thoughts and psychiatric disorders are interfering with cognitive   No results found for: "POCLITH", "LITHIUM"   No results found for: "PHENYTOIN", "PHENOBARB", "VALPROATE", "CBMZ"   .res Assessment: Plan:    Bipolar affective disorder, mixed, severe, with psychotic behavior (HCC)  Generalized anxiety disorder - Plan: sertraline (ZOLOFT) 50 MG tablet  Panic disorder with agoraphobia - Plan: sertraline (ZOLOFT) 50 MG  tablet  Claustrophobia  Mild cognitive impairment  Insomnia due to mental condition  Low vitamin D level   Liborio Nixon has chronic severe anxiety with panic attacks as well as bipolar disorder with a history of significant lability.  She has had more severe instability with psychotic sx and cognitive problems off and on lately.  She remains chronically anxious and that has been worse lately.   Psych hospitalization 04/2021 at [facility] for psychosis. She also has mild cognitive impairment as a complicating factor.  She needs her husband's assistance to manage her medications.    She is having more panic attacks and her agoraphobia is worse.  She is not wanting to leave the house.  She could not get out of the car to go into a store with her husband the other day because of intense fear. We  discussed the options for treating the anxiety and panic.  First line choice is generally SSRIs but they have a risk of causing mood cycling.  We just recently got her off benzodiazepines so we will be ideal not to restart those if it can be avoided because of their cognitive side effects in her case.  Therefore we will try a low-dose sertraline.  She was informed about the risk of mania and other side effects.  Marked benefit for anxiety with  sertraline 50 mg daily.  Continue Seroquel IR 300 mg HS well and XR 400 mg nightly previous manic sx better with increase dose. Trouble sleeping without extra 100 mg quetiapine HS and takes XR 400 mg and IR 300 mg at 730 pm.   Have pushed Seroquel  as far as possible   Continue lamotrigine 150 BID.    Consider switch to Caplyta or Saphris bc lower SE and may help anxiety but probably would not be covered by insurance.  Discussed potential metabolic side effects associated with atypical antipsychotics, as well as potential risk for movement side effects. Advised pt to contact office if movement side effects occur.  Disc family's fear of LT SE.  However current  meds work and without them sx unmanageable.  Has been able to stop BZ   Continue MVI.    Pt is chronically needy and requires extended appts DT chronic anxiety and easily stressed but much better with sertraline low dose for anxiety. Supportive and cognitive work are done with the patient on her excessive guilt and now stress of vertigo.  Cannot drive now and wasn't much before.   Supportive therapy dealing with chronic stressors and difficulty with Gdaughter's mental illness..   Pt doesn't feel able to fix her meds by herself. Disc her fears about going out and other phobias and disc behaviour therapy for I. Fight this behaviorally. Still agoraphobic and doesn't want to come out.  FU 3-4 mos   Meredith Staggers, MD, DFAPA   Please see After Visit Summary for patient specific instructions.  Future Appointments  Date Time Provider Department Center  04/12/2023 10:30 AM Cottle, Steva Ready., MD [facility] None       No orders of the defined types were placed in this encounter.      -------------------------------

## 2023-03-20 ENCOUNTER — Other Ambulatory Visit: Payer: Self-pay | Admitting: Psychiatry

## 2023-03-20 DIAGNOSIS — F4001 Agoraphobia with panic disorder: Secondary | ICD-10-CM

## 2023-03-20 DIAGNOSIS — F411 Generalized anxiety disorder: Secondary | ICD-10-CM

## 2023-04-12 ENCOUNTER — Telehealth: Payer: Medicare HMO | Admitting: Psychiatry

## 2023-04-14 DIAGNOSIS — N200 Calculus of kidney: Secondary | ICD-10-CM | POA: Diagnosis not present

## 2023-04-21 DIAGNOSIS — Z0000 Encounter for general adult medical examination without abnormal findings: Secondary | ICD-10-CM | POA: Diagnosis not present

## 2023-04-21 DIAGNOSIS — I77810 Thoracic aortic ectasia: Secondary | ICD-10-CM | POA: Diagnosis not present

## 2023-04-21 DIAGNOSIS — I10 Essential (primary) hypertension: Secondary | ICD-10-CM | POA: Diagnosis not present

## 2023-04-21 DIAGNOSIS — E7800 Pure hypercholesterolemia, unspecified: Secondary | ICD-10-CM | POA: Diagnosis not present

## 2023-04-21 DIAGNOSIS — K219 Gastro-esophageal reflux disease without esophagitis: Secondary | ICD-10-CM | POA: Diagnosis not present

## 2023-04-21 DIAGNOSIS — E039 Hypothyroidism, unspecified: Secondary | ICD-10-CM | POA: Diagnosis not present

## 2023-04-21 DIAGNOSIS — F319 Bipolar disorder, unspecified: Secondary | ICD-10-CM | POA: Diagnosis not present

## 2023-04-27 DIAGNOSIS — H35342 Macular cyst, hole, or pseudohole, left eye: Secondary | ICD-10-CM | POA: Diagnosis not present

## 2023-04-27 DIAGNOSIS — E119 Type 2 diabetes mellitus without complications: Secondary | ICD-10-CM | POA: Diagnosis not present

## 2023-04-27 DIAGNOSIS — H43813 Vitreous degeneration, bilateral: Secondary | ICD-10-CM | POA: Diagnosis not present

## 2023-05-01 DIAGNOSIS — N3020 Other chronic cystitis without hematuria: Secondary | ICD-10-CM | POA: Diagnosis not present

## 2023-05-01 DIAGNOSIS — N200 Calculus of kidney: Secondary | ICD-10-CM | POA: Diagnosis not present

## 2023-05-01 DIAGNOSIS — R8271 Bacteriuria: Secondary | ICD-10-CM | POA: Diagnosis not present

## 2023-05-02 DIAGNOSIS — E7800 Pure hypercholesterolemia, unspecified: Secondary | ICD-10-CM | POA: Diagnosis not present

## 2023-05-03 ENCOUNTER — Ambulatory Visit (INDEPENDENT_AMBULATORY_CARE_PROVIDER_SITE_OTHER): Payer: Medicare HMO | Admitting: Psychiatry

## 2023-05-03 ENCOUNTER — Encounter: Payer: Self-pay | Admitting: Psychiatry

## 2023-05-03 DIAGNOSIS — F5105 Insomnia due to other mental disorder: Secondary | ICD-10-CM

## 2023-05-03 DIAGNOSIS — F3164 Bipolar disorder, current episode mixed, severe, with psychotic features: Secondary | ICD-10-CM | POA: Diagnosis not present

## 2023-05-03 DIAGNOSIS — G3184 Mild cognitive impairment, so stated: Secondary | ICD-10-CM | POA: Diagnosis not present

## 2023-05-03 DIAGNOSIS — R7989 Other specified abnormal findings of blood chemistry: Secondary | ICD-10-CM | POA: Diagnosis not present

## 2023-05-03 DIAGNOSIS — F411 Generalized anxiety disorder: Secondary | ICD-10-CM | POA: Diagnosis not present

## 2023-05-03 DIAGNOSIS — F4001 Agoraphobia with panic disorder: Secondary | ICD-10-CM

## 2023-05-03 DIAGNOSIS — F40240 Claustrophobia: Secondary | ICD-10-CM

## 2023-05-03 MED ORDER — SERTRALINE HCL 50 MG PO TABS
50.0000 mg | ORAL_TABLET | Freq: Every day | ORAL | 1 refills | Status: DC
Start: 2023-05-03 — End: 2023-11-28

## 2023-05-03 MED ORDER — QUETIAPINE FUMARATE ER 400 MG PO TB24
ORAL_TABLET | ORAL | 1 refills | Status: DC
Start: 2023-05-03 — End: 2023-10-25

## 2023-05-03 NOTE — Progress Notes (Signed)
[name] [id number] 29-May-1951 72 y.o.   Virtual Visit via Telephone Note  I connected with pt by telephone and verified that I am speaking with the correct person using two identifiers.   I discussed the limitations, risks, security and privacy concerns of performing an evaluation and management service by telephone and the availability of in person appointments. I also discussed with the patient that there may be a patient responsible charge related to this service. The patient expressed understanding and agreed to proceed.  I discussed the assessment and treatment plan with the patient. The patient was provided an opportunity to ask questions and all were answered. The patient agreed with the plan and demonstrated an understanding of the instructions.   The patient was advised to call back or seek an in-person evaluation if the symptoms worsen or if the condition fails to improve as anticipated.  I provided 30 minutes of non-face-to-face time during this encounter. . The patient was located at home and the provider was located office. Session from 230p-300p  Subjective:   Patient ID:  [name] is a 72 y.o. (DOB 10-Jul-1951) female.  Chief Complaint:  Chief Complaint  Patient presents with   Follow-up   Anxiety   Depression    Anxiety Symptoms include decreased concentration and dizziness. Patient reports no chest pain, confusion, nervous/anxious behavior, palpitations or suicidal ideas.        [name] presents to the office today for follow-up of anxiety and depression and poor cognition.  At her visit October 12, 2018.  Because of her poor cognition which did seem to get even worse lately with her memory and given a negative unremarkable medical work-up by her primary care doctor the decision was made to change her from Xanax which she has been on for years to lorazepam.  Lorazepam often has less cognitive side effects than does alprazolam.  She had a lot  of difficulty with headaches and insomnia with the transition.  She called several times in that transition.  Her husband reported that her cognition was better after the transition however.  She was satisfied with the Ativan.  There was an attempt to reduce her lamotrigine to 150 mg also in hopes of improving cognition but it was uncertain at the last visit where there she had actually done so.  visit December 2020 without med changes.  She had continued to do cognitively better off alprazolam and on lorazepam.  seen March 2021.   Better than December visit.  Feeling good and less down.  Adjusting time of med helped excessively sleep.  Brief hypomanic episode resolved where she cleaned all night.  Sleep 10 hours . No med changes.  12/23/19 appt.  Noted: More depression, crying and racing thoughts and distressed to the point of wondering if needed the hospital.  Sleep got worse and needed Seroquel IR 200 also bc went 2 days without sleep.  It worked and helped sleep.  Worry a lot and melancholy this week.  Cries over what happens dealing with her daugher.[name] has prostate and kidney cancer again.  Had surgery June 10, 2019 and she's cried a lot.  He's incontinent and very uncomfortable.  He usually has positive attitude.   Worry over his health and how she'll do if something happens to him.  She's afraid of Covid.  Trusting in God usually.  She realizes she  Needs to drive occasionally bc of his health.  [name] had prostate CA and lived  to 59 yo.  Uncle died of it.  Not markedly depressed. Can't walk much dT plantar fascitis. Also stressed over [name] 72 yo not doing school work and causing problems.  Her father [name] is a bad parent and not helpful.  Stressed over [employer] and downs too and they have periodic conflict.   More down and anxious.   Guilt tendency.   Patient reports difficulty with sleep initiation or maintenance in part DT anxiety and racing thoughts.Intermittent sleep  problems with days and nights mixed up.  Not napping. Not manic.[name] Denies appetite disturbance.  Patient reports that energy and motivation have been good.  Patient has some difficulty with concentration.  Chronic memory problems.   Patient denies any suicidal ideation. Plan:   No changes in meds indicated except agree to take the extra Seroquel 200 IR HS for mixed manic sx that are worse right now.  May be having mixed seasonal sx.  02/20/2020 appointment with the following noted: Doing fairly good except chronic worry over [name].  Some days cries a lot over it.  D having a hard time lately.  Still easily stressed but not more than usual. Stopped extra quetiapine 200 mg HS bc no longer needed it. Anxiety is chronic but at baseline as is depression and not manic now. H helps her take the meds.   Memory is still bad but has been worse when on Xanax instead fo Ativan.  Sleep is fine from 10 to 9 which is much better than in the past.  Occ spells of staying up for 2 days and then needs to take quetiapine.   [name] goes for FU soon for cancer check up. Plan: no med changes  05/21/20 appt with following noted: "A lot of problems".  Can't drive bc fear and anxiety.  Needed to drive H and couldn't. Fall off toilet twice. Dizzy and HA 2 days ago with a lot of crying and stress with daughter. Leaving home even coming here causes anxiety.    Cataract surgery twice. Fear of Bill being diagnosed with recurrent cancer. Chronic anxiety greather than depression.   Don't know what to do with daughter. Sleep, appetite and energy are OK. Tolerating meds. Sees GD [name] one day weekly. H helps with meds bc she's forgetful and easily confused.  Loses train of thoughts. Dog has separation anxiety and so they don't go out much.  Plan: no med changes  08/18/20 appt with following noted: 2 ER visitis with vertigo.  RX diazepam and meclizine. Rarely takes former but takes latter regularly. Shakes so bad it made  her cry and scream.  Started PT. Continues lorazepam low dose 0.5 mg AM and 1.0 mg HS.  Still has dizziness and vertigo. Memory is still bad and H administers meds with pill box. Chronic anxiety.  Depression manageable. Sleep OK and tolerates meds otherwise. Angelica Chessman driving her crazy. Plan: no med changes  11/17/20 appt with following noted: Has had periods of vertigo and meds for it.  Then had manic sx with cleaning and couldn't sleep for 24 hours. Stopped quetiapine 200 and continued quetiapine XR 800. Also started diazepam 5 mg BID for vertigo and continued lorazepam for anxiety.  No meclizine now.  Vertigo stopped and manic sx stopped.  Tolerating meds now. Still feels Angelica Chessman is mean to her too often and she will cry over it. Mandy's ExH [name] in jail and is the father of Mandy's D [name] 56 yo.  Will be there  for 3 mos. Not markedly depressed.  But hates herself some days.  Still memory problems about the same.  Sleeping OK now.  Periods of mood swings. Easily anxious with relatives visiting.   Plan: Mixed manic sx resolved for now. No med changes today.  02/17/2021 appointment with the following noted: Not good.  "A whole lof of everything."  Shaking for mos. It comes and goes.  Shaking at times makes her think she'll have a nervous breakdown.   Went to ER and dx vertigo.  Has tried meclizine and Valium but scared to take it.  Big shakes again today.  Memory is not good. Sleep to escape anxiety.  Scared to leave home. Not sure what brings on the spells of shaking. Today sx hit her as soon as she awoke.  Plan: No med changes  03/12/2021 phone call: Patient with a lot of anxiety.  Husband reported the patient had been hallucinating and was paranoid that he had left her.  Some confusion over identity of people that were around her.  It was suggested that she have medical work-up because it sounded like she had delirium and may have a UTI causing that. 03/17/2021 psychiatric  hospitalization. 03/22/2021 MD response:  Note Patient has been very unstable with bipolar disorder mixed symptoms lately also with some delirium of unknown cause.  She will be seeing her primary care physician tomorrow.  Due to the severity of her symptoms it is suggested that she add haloperidol 2 mg nightly to her current quetiapine 800 mg nightly since we cannot increase the quetiapine dose further.  This may help with her confusion and agitation.  It is okay to talk with her husband because he administers her medication and because she is too confused to understand the instructions.     04/16/21 H called with following:  Pt.'s husband called.  She is currently in [facility] waiting for an inpatient pyschiatric bed.  Pt advised her husband that Dr Jennelle Human told her to stop taking the Lorazepam.  He said he is not aware of that chang in her prescription.  He would like someone to call him and advise if there were any changes to her meds so he stays aware and can let the hospital know if needed. Husband was informed we had not made any medication changes and would not have suggested she abruptly stopped lorazepam which could cause withdrawal and other kinds of problems.  05/11/2021 appointment with the following noted:  spoke with H for info At hospital was taken off Ativan, buspirone, and Seroquel reduced to 500 mg HS (as 1 of XR 400 mg  and 1/2 of 200 mg IR quetiapine).  Had sedative SE at 200 mg queiapinte !R.  Off risperidone and haloperidol.   Hospitalized at [facility] for 9 days.  They didn't feel communication was good with them. But she is better now than she was.  Had been psychotic PTH.  Last 2-3 days getting more hyper and can't wind down and hard time going to sleep.  Came home 05/03/21. She didn't like the restrictions at the hospital.  No panic lately and wants something to help her sleep. Plan; Mixed manic sx including psychosis recently but not psychotic now but  insomnia.. Will get more psychotic if she doesn't sleep but apparently didn't tolerate Seroquel IR 200 mg HS well so will increase to quetiapine IR 150 mg HS and XR 400 mg nightly. Continue lamotrigine.    06/11/21 appt:  H  Bill. H thinks she's doing goood. Doing very fine.  No trouble sleeping.  Sometimes in morning feels sad bc she knows she will have racing thoughts, but it resolves after a little while.  Racing thoughts since hospital.  Worries over money chronically. To bed 1015-7:30.  Maybe longer which is normal. H administers meds.  Some wordfinding problems.  Does journal which helps. No SE with meds.  Yesterday was hard with anxiety but today is better.  On lamotrigine 150 BID, SERoquel XR 400 + Seroquel IR 150 mg HS. Plan: Much better but not fully resolved.  Mixed manic sx including psychosis recently but not psychotic but insomnia last visit resolved with the following... apparently didn't tolerate Seroquel IR 200 mg HS well so increased quetiapine IR 150 mg HS and XR 400 mg nightly and now she is sleeping but still having some racing thoughts. Continue lamotrigine 150 BID.    07/14/2021 appointment with the following noted: Bad day yesterday feeling very depressed without anxiety. Not sleeping well lately.  Thinks it's related to less Seroquel than in the past.  Some EMA.  Always normal slept a lot. Compulsive cleaning and everything in it's place.  Arranges soap dispensers. Cleaning in the middle of the night. No recent psychotic sx but still racing thoughts and chronic $ worries. Still afraid to leave home.  09/20/2021 appt noted: Great overall.  Occ insomnia with EMA.  Still worry but tries not let it consume me.  Still agoraphobia and hates to go out to dentists and doctors.  Challenge to go to dentist.  Can still dread things. Brief nap in daytimes. No SE H administers meds. Seroquel 300 mg HS, XR 400 mg HS.  Lamotrigine 150 Patient reports stable mood and denies  depressed or irritable moods.   Patient denies difficulty with sleep initiation or maintenance. Denies appetite disturbance.  Patient reports that energy and motivation have been good.  Patient denies any difficulty with concentration.  Patient denies any suicidal ideation.  01/20/22 appt noted: also talked with [name] Doesn't like waiting 4 mos between appts. Was manic before increasing Seroquel with irritability, not sleeping a couple of days and excessive cleaning. Seroquel increased to IR 400 mg HS.  No SE I feel much Better now.  Doing better at time with anxiety.   Chronic worry over D and GD. GD with psych problems. Sleeping good again. Racing thoughts are better but not gone Tolerating meds.   No further changes desired.   Plan: Seroquel IR 400 mg HS well and XR 400 mg nightly abc recent manic sx better with increase dose. Continue lamotrigine 150 mg twice daily  04/28/2022 appointment noted: Trouble sleeping without extra 100 mg quetiapine HS and takes XR 400 mg and IR 300 mg at 730 pm.  Otherwise will be up until 3 AM.  Sleep from 11 until 830.  Good unless anxious. Doing fairly well except 2 weeks bad spell from July 31 related to Bill receiving call from woman he worked with 8 years ago and triggered fear ans suspiciousness and insecurity and overwhelmed her.  She cried and over reacted.  Rough 2 weeks but otherwise ok. Real worried about [name] who is self cutting.  Seeing therapist and psychiatrist.  Her father is terrible and inattentive.  She is 72 yo and never happy. I still have agoraphobia and doesn't want to go ut places.  Her dog also has severe anxiety and doesn't like to be alone too.  Still fearul about money.  08/03/22 appt noted: Doing pretty well overall.  Chronic worry over money.  Met old friend on Facebook.  She would contact Marylu Lund a lot.  Was making her sad.  Would cry every time the woman called.  ,This was wearing her out bc woman was hyperverbal.  Cut the woman off.   Since stopped talking to her then no longer crying. When starts to get dark then racing thoughts.  Driving me crazy.  Chronic worry .  Chronic memorhy problems and tendency to be obsessed over fear of dementia. Irritable and confused.  Might nap 30 min and rest her mind and awakens feeling better. Continues the same meds: lamotrigine and quetiapine. Gained 20# last year. Plan: Have pushed Seroquel  as far as possible so start another mood stabilizer Trileptal and increase to 10 mg AM and 300 mg pM  08/09/2022 phone call: She took oxcarbazepine 150 mg Friday and Saturday and developed severe abdominal pain, bowel and bladder incontinence, and impaired vision.  She skipped it on Sunday and symptoms resolved.  She took it again yesterday and the side effects returned.  States it did help with the racing thoughts but she is unable to tolerate it.  That was just on half of oxcarbazepine tablet. MD response: DC oxcarbazepine due to side effects.  10/17/22 appt noted: Devoria Glassing today. Still racing thoughts and cleaning a lot.  More anxiety than depression.  Rough road.  Worries about everything.   Thoughts jump from one thing to another.  Can't get away from it.  Can't focus.  Hard to take instruction bc of this.    12/20/22 appt noted: Not good.  Kidney stone. More anxiety last couple of mos. Get something on her mind and she cannot get it off.  Obsesses on things that don't matter.  Trouble going to sleep.  Couldn't go into store the other day dT panic and agoraphobia.   Occ dep but nothing to worry about.  More panic.   Lost 15 # on [place].   Plan start sertraline for anxiety  03/03/23 appt noted: TC Been hosp for kidney px kidney stone.  Glad to be over it.  .   Lost 42 # since April 1 with Monjauro.  Eating better.   Anxiety is better with sertraline 50 mg marked benefit with very little anxiety.  Much happier and less crying.  Better acceptance and self talk better than ever before.    Still some dep which interferes with function at times but not as severe. I'm doing ok.   Tolerating psych meds without problems.  05/03/23 appt noted:  Psych med: lamotrigine 150 BID, Quetiapine ER 400 mg pm and IR 300-400 mg HS, sertraline 50 for anxiety. Real sleepy today after taking quetiapine 200 mg , more than usual.  Only takes it prn.  Usually getting enough sleep.  But sometimes keyed up and can't go to sleep.   Been feeling good overall.  Occ bad days.  For the most part doing fine.   Does "thrifting" which lifts her spirits about 1 time weekly.   It takes her mind off worries.   No marked dep and anxiety is manageable. Lost 55#.   Doing fine with Bill.   [name] 72 yo.  Quiet girl. No med change desired.  Prior psychiatric medication trials include  paroxetine, Lexapro,  Sertraline 50 daily.   risperidone 4 mg which was sedating, aripiprazole,  perphenazine,  Seroquel 1000, olanzapine for anxiety, lithium which was not tolerated even  at very low dosages.  Topamax for anxiety, Oxcarbazepine 150 mg bowel and bladder incontinence and impaired vision. She was intolerant of both Namenda and Aricept.   Lamotrigine 300.   Xanax, Ativan, diazepam 5 mg BID   This is not an exhaustive list.   Psych hospitalization 04/2021 at [facility] for psychosis.  At the visit in February 2020 Daily was more acutely confused recently for no clear reason.   She had a negative medical work-up for causes of short-term memory problems and confusion.  Therefore the decision was made to try switching her from alprazolam to lorazepam at the lowest possible dose in hopes of improving her cognition.  This was successful and her cognition was improved significantly and her husband verified this.    [name] seeing Dr. Tonny Bollman   Review of Systems:  Review of Systems  Eyes:  Positive for visual disturbance.  Cardiovascular:  Negative for chest pain and palpitations.  Musculoskeletal:  Positive for  arthralgias, back pain and gait problem.  Neurological:  Positive for dizziness. Negative for weakness.  Psychiatric/Behavioral:  Positive for decreased concentration, dysphoric mood and sleep disturbance. Negative for agitation, behavioral problems, confusion, hallucinations, self-injury and suicidal ideas. The patient is not nervous/anxious and is not hyperactive.     Medications: I have reviewed the patient's current medications.  Current Outpatient Medications  Medication Sig Dispense Refill   acetaminophen (TYLENOL) 325 MG tablet Take 650 mg by mouth every 6 (six) hours as needed for mild pain or headache.     amLODipine (NORVASC) 5 MG tablet Take 5 mg by mouth every evening.     aspirin 81 MG tablet Take 81 mg by mouth daily.     famotidine (PEPCID) 20 MG tablet Take 20 mg by mouth in the morning and at bedtime.     furosemide (LASIX) 20 MG tablet Take 20 mg by mouth daily as needed (for severe swelling).     HYDROcodone-acetaminophen (NORCO/VICODIN) 5-325 MG tablet Take 1 tablet by mouth every 4 (four) hours as needed. (Patient taking differently: Take 1 tablet by mouth every 4 (four) hours as needed for moderate pain or severe pain.) 10 tablet 0   lamoTRIgine (LAMICTAL) 150 MG tablet TAKE 1 TABLET TWICE DAILY 180 tablet 3   levocetirizine (XYZAL) 5 MG tablet Take 5 mg by mouth every evening.     levothyroxine (SYNTHROID) 100 MCG tablet Take 100 mcg by mouth daily before breakfast.     lisinopril (ZESTRIL) 20 MG tablet Take 1 tablet (20 mg total) by mouth daily.     metoprolol succinate (TOPROL-XL) 50 MG 24 hr tablet Take 50 mg by mouth daily.     MOUNJARO 5 MG/0.5ML Pen Inject 5 mg into the skin once a week.     Multiple Vitamin (MULTIVITAMIN) tablet Take 1 tablet by mouth daily.     ondansetron (ZOFRAN-ODT) 4 MG disintegrating tablet Take 1 tablet (4 mg total) by mouth every 8 (eight) hours as needed for nausea or vomiting. 12 tablet 0   oxybutynin (DITROPAN) 5 MG tablet Take 1 tablet  (5 mg total) by mouth 2 (two) times daily. 60 tablet 0   polyethylene glycol (MIRALAX / GLYCOLAX) packet Take 17 g by mouth 2 (two) times daily as needed for mild constipation.     potassium chloride SA (K-DUR,KLOR-CON) 20 MEQ tablet Take 20 mEq by mouth daily.     QUEtiapine (SEROQUEL) 100 MG tablet Take 100 mg by mouth at bedtime as needed (Sleep).  QUEtiapine (SEROQUEL) 300 MG tablet TAKE 1 TABLET AT BEDTIME (Patient taking differently: Take 300 mg by mouth at bedtime.) 90 tablet 10   simvastatin (ZOCOR) 40 MG tablet Take 1 tablet (40 mg total) by mouth every evening. 30 tablet 0   QUEtiapine (SEROQUEL XR) 400 MG 24 hr tablet 1 at 730PM 90 tablet 1   sertraline (ZOLOFT) 50 MG tablet Take 1 tablet (50 mg total) by mouth daily. 90 tablet 1   No current facility-administered medications for this visit.    Medication Side Effects: None  Allergies:  Allergies  Allergen Reactions   Maxzide [Triamterene-Hctz] Other (See Comments)    Hurt patient's kidneys   Celexa [Citalopram Hydrobromide] Nausea Only   Metformin And Related Nausea And Vomiting   Other Nausea And Vomiting and Other (See Comments)    Unnamed medication hurt the patient's kidneys   Reglan [Metoclopramide] Nausea Only   Risperdal [Risperidone] Nausea Only and Other (See Comments)    Also caused hallucinations   Trazodone And Nefazodone Nausea Only   Zestril [Lisinopril] Nausea Only   Lithium Palpitations and Other (See Comments)    Shakes and delusional pt started seeing things      Past Medical History:  Diagnosis Date   Anxiety    Arthritis    Asthma    Bipolar 1 disorder (HCC)    CHF (congestive heart failure) (HCC)    Complication of anesthesia    left a bad taste in my mouth it has lasted since january    Depression    Diabetes mellitus    Hernia    umbilical   Hyperlipidemia    Hypertension    Hypothyroidism    Pericarditis, viral 2010 October   Sleep apnea    uses cpap setting of 15    Family  History  Problem Relation Age of Onset   Diabetes Mother    Hypertension Mother    Hyperlipidemia Mother    Diabetes Father    Hypertension Father    Hyperlipidemia Father    Hypertension Sister     Social History   Socioeconomic History   Marital status: Married    Spouse name: Not on file   Number of children: Not on file   Years of education: Not on file   Highest education level: Not on file  Occupational History   Not on file  Tobacco Use   Smoking status: Never   Smokeless tobacco: Never  Substance and Sexual Activity   Alcohol use: No   Drug use: No   Sexual activity: Not on file  Other Topics Concern   Not on file  Social History Narrative   Not on file   Social Determinants of Health   Financial Resource Strain: Not on file  Food Insecurity: No Food Insecurity (12/22/2022)   Hunger Vital Sign    Worried About Running Out of Food in the Last Year: Never true    Ran Out of Food in the Last Year: Never true  Transportation Needs: No Transportation Needs (12/22/2022)   PRAPARE - Administrator, Civil Service (Medical): No    Lack of Transportation (Non-Medical): No  Physical Activity: Not on file  Stress: Not on file  Social Connections: Not on file  Intimate Partner Violence: Not At Risk (12/22/2022)   Humiliation, Afraid, Rape, and Kick questionnaire    Fear of Current or Ex-Partner: No    Emotionally Abused: No    Physically Abused: No    Sexually  Abused: No    Past Medical History, Surgical history, Social history, and Family history were reviewed and updated as appropriate.   Please see review of systems for further details on the patient's review from today.   Objective:   Physical Exam:  There were no vitals taken for this visit.  Physical Exam Neurological:     Mental Status: She is alert and oriented to person, place, and time.     Cranial Nerves: No dysarthria.  Psychiatric:        Attention and Perception: Perception normal.  She is inattentive.        Mood and Affect: Mood is anxious. Mood is not depressed. Affect is not labile or tearful.        Speech: Speech is not rapid and pressured or slurred.        Behavior: Behavior is cooperative.        Thought Content: Thought content normal. Thought content is not paranoid or delusional. Thought content does not include homicidal or suicidal ideation. Thought content does not include suicidal plan.        Cognition and Memory: Cognition normal. She exhibits impaired recent memory.        Judgment: Judgment normal.     Comments: Insight intact Some word-finding issues longterm.  Less distressed over the last year than in the past.     Lab Review:     Component Value Date/Time   NA 140 12/25/2022 0719   K 4.0 12/25/2022 0719   CL 106 12/25/2022 0719   CO2 26 12/25/2022 0719   GLUCOSE 99 12/25/2022 0719   GLUCOSE 154 (H) 09/01/2006 1100   BUN 13 12/25/2022 0719   CREATININE 0.99 12/25/2022 0719   CREATININE 0.93 07/19/2011 1110   CALCIUM 9.9 12/25/2022 0719   PROT 6.8 12/22/2022 0300   ALBUMIN 3.6 12/22/2022 0300   AST 19 12/22/2022 0300   ALT 15 12/22/2022 0300   ALKPHOS 76 12/22/2022 0300   BILITOT 0.7 12/22/2022 0300   GFRNONAA >60 12/25/2022 0719   GFRAA >60 03/23/2016 1933       Component Value Date/Time   WBC 7.4 12/22/2022 0300   RBC 4.17 12/22/2022 0300   HGB 13.1 12/22/2022 0300   HCT 38.8 12/22/2022 0300   PLT 204 12/22/2022 0300   MCV 93.0 12/22/2022 0300   MCH 31.4 12/22/2022 0300   MCHC 33.8 12/22/2022 0300   RDW 13.6 12/22/2022 0300   LYMPHSABS 1.2 04/15/2021 1831   MONOABS 0.4 04/15/2021 1831   EOSABS 0.1 04/15/2021 1831   BASOSABS 0.1 04/15/2021 1831   Prior lamotrigine level in 2018 on this dosage was 3.6.  Not particularly high.  Per Dr. Delaine Lame: Clinical Impressions: Cognitive complaints are likely due to underlying psychiatric disorder (bipolar disorder/anxiety disorder with racing thoughts). Diagnostic impressions based  on test performances are limited due to the patient's severe inability to fully attend to the tasks. However, based on clinical presentation, I highly doubt the presence of a neurodegenerative dementia. It is much more likely that her racing thoughts and psychiatric disorders are interfering with cognitive   No results found for: "POCLITH", "LITHIUM"   No results found for: "PHENYTOIN", "PHENOBARB", "VALPROATE", "CBMZ"   .res Assessment: Plan:    Bipolar affective disorder, mixed, severe, with psychotic behavior (HCC) - Plan: QUEtiapine (SEROQUEL XR) 400 MG 24 hr tablet  Generalized anxiety disorder - Plan: sertraline (ZOLOFT) 50 MG tablet  Panic disorder with agoraphobia - Plan: sertraline (ZOLOFT) 50 MG tablet  Claustrophobia  Mild cognitive impairment  Insomnia due to mental condition  Low vitamin D level   [name] has chronic severe anxiety with panic attacks as well as bipolar disorder with a history of significant lability.  She has had more severe instability with psychotic sx and cognitive problems off and on lately.  S Psych hospitalization 04/2021 at [facility] for psychosis. She also has mild cognitive impairment as a complicating factor.  She needs her husband's assistance to manage her medications.    She was having more panic attacks and agoraphobia, not wanting to leave the house.  She could not get out of the car to go into a store with her husband earlier in 2024, because of intense fear.  But it is better.  Overall dep better than in prior years and less anxious but not gone.  We discussed the options for treating the anxiety and panic.  First line choice is generally SSRIs but they have a risk of causing mood cycling.  We got her off benzodiazepines so we will be ideal not to restart those if it can be avoided because of their cognitive side effects in her case.  Therefore we tried a low-dose sertraline.  Which appeared to help anxiety a good deal.  She was  informed about the risk of mania and other side effects.  Marked benefit for anxiety with  sertraline 50 mg daily.  Continue Seroquel IR 300 mg HS well and XR 400 mg nightly previous manic sx better with increase dose. Trouble sleeping without extra 100 mg quetiapine HS and takes XR 400 mg and IR 300 mg at 730 pm.   Have pushed Seroquel  as far as possible   Continue lamotrigine 150 BID.    Consider switch to Caplyta or Saphris bc lower SE and may help anxiety but probably would not be covered by insurance.  Discussed potential metabolic side effects associated with atypical antipsychotics, as well as potential risk for movement side effects. Advised pt to contact office if movement side effects occur.  Disc family's fear of LT SE.  However current meds work and without them sx unmanageable.  Has been able to stop BZ   Continue MVI.    No med changes today  Pt is chronically needy and requires extended appts DT chronic anxiety and easily stressed but much better with sertraline low dose for anxiety. Supportive and cognitive work are done with the patient on her excessive guilt and now stress of vertigo.  Cannot drive now and wasn't much before.    Pt doesn't feel able to fix her meds by herself.  FU 3-4 mos   Meredith Staggers, MD, DFAPA   Please see After Visit Summary for patient specific instructions.  Future Appointments  Date Time Provider Department Center  05/31/2023  1:00 PM Barrie Folk, AUD [facility] None       No orders of the defined types were placed in this encounter.      -------------------------------

## 2023-05-22 DIAGNOSIS — Z23 Encounter for immunization: Secondary | ICD-10-CM | POA: Diagnosis not present

## 2023-05-31 ENCOUNTER — Ambulatory Visit: Payer: Medicare HMO | Attending: Family Medicine | Admitting: Audiologist

## 2023-05-31 DIAGNOSIS — H903 Sensorineural hearing loss, bilateral: Secondary | ICD-10-CM | POA: Insufficient documentation

## 2023-05-31 NOTE — Procedures (Signed)
Outpatient Audiology and [facility] [address] [place], [place]  [address] [phone]  AUDIOLOGICAL  EVALUATION  NAME: [name]     DOB:   01-23-1951      MRN: [id number]                                                                                     DATE: 05/31/2023     REFERENT: Irven Coe, MD STATUS: Outpatient DIAGNOSIS: Sensorineural hearing loss bilateral  History: [name] was seen for an audiological evaluation due to concerns for a decline in her hearing over the last year. [name] has had significant changes in her health over the last year and is experiencing cognitive decline.  [name] said [name] can sometimes spell words but not recall the word itself.  [name] is noticing increased difficulty hearing as well and is asking people to repeat.  Her husband wears hearing aids and [name] is familiar with what hearing loss looks like.  [name] denies any pain or pressure in either ear.  [name] has a buzzing sound in both ears for over a year.  It is not interfering with sleep.  [name] has diabetes and is taking medication which has led to dramatic weight loss.  [name] denies any eustachian tube dysfunction secondary to weight loss.   Evaluation:  Otoscopy showed a clear view of the tympanic membranes, bilaterally Tympanometry results were consistent with normal but shallow middle ear function, Type As, bilaterally Audiometric testing was completed using Conventional Audiometry techniques with insert earphones and TDH headphones. Test results are consistent with moderate sloping to moderately severe sensorineural hearing loss bilaterally. Speech Recognition Thresholds were obtained at 45 dB HL in the right ear and at 45 dB HL in the left ear. Word Recognition Testing was completed at 85 dB HL and [name] scored 84% in the right ear and 92% in the left ear. [name] would sometimes spell the words instead of repeating it, these were considered correct responses.   Results:  The test  results were reviewed with [facility].  [name] is a hearing aid candidate bilaterally due to moderate to moderately severe sensorineural hearing loss.  [name] was given a copy of her hearing test and counseled on results.  [name] is interested in going to [employer] where her husband recently got his hearing aids.  Recommendations: 1.   Recommend hearing aids for both years due to moderate sensorineural hearing loss.  Patient has a copy of hearing test and a plan for obtaining hearing aids.   36 minutes spent testing and counseling on results.   If you have any questions please feel free to contact me at [phone].  Ammie Ferrier Audiologist, Au.D., CCC-A 05/31/2023  2:16 PM  Cc: Irven Coe, MD

## 2023-06-01 DIAGNOSIS — H43813 Vitreous degeneration, bilateral: Secondary | ICD-10-CM | POA: Diagnosis not present

## 2023-06-01 DIAGNOSIS — H35372 Puckering of macula, left eye: Secondary | ICD-10-CM | POA: Diagnosis not present

## 2023-06-07 ENCOUNTER — Other Ambulatory Visit: Payer: Self-pay | Admitting: Psychiatry

## 2023-06-07 DIAGNOSIS — F3164 Bipolar disorder, current episode mixed, severe, with psychotic features: Secondary | ICD-10-CM

## 2023-06-08 NOTE — Telephone Encounter (Signed)
LF 08/05; LV 09/11; NV 11/13

## 2023-06-28 ENCOUNTER — Other Ambulatory Visit (HOSPITAL_COMMUNITY): Payer: Self-pay | Admitting: Family Medicine

## 2023-06-28 DIAGNOSIS — I77810 Thoracic aortic ectasia: Secondary | ICD-10-CM

## 2023-06-29 ENCOUNTER — Other Ambulatory Visit: Payer: Self-pay | Admitting: Psychiatry

## 2023-06-29 DIAGNOSIS — F3164 Bipolar disorder, current episode mixed, severe, with psychotic features: Secondary | ICD-10-CM

## 2023-06-29 NOTE — Telephone Encounter (Signed)
Has appt 11/13

## 2023-07-04 ENCOUNTER — Ambulatory Visit: Payer: Medicare HMO | Admitting: Psychiatry

## 2023-07-05 ENCOUNTER — Ambulatory Visit: Payer: Medicare HMO | Admitting: Psychiatry

## 2023-07-05 ENCOUNTER — Encounter: Payer: Self-pay | Admitting: Psychiatry

## 2023-07-05 DIAGNOSIS — R7989 Other specified abnormal findings of blood chemistry: Secondary | ICD-10-CM

## 2023-07-05 DIAGNOSIS — G3184 Mild cognitive impairment, so stated: Secondary | ICD-10-CM

## 2023-07-05 DIAGNOSIS — F4001 Agoraphobia with panic disorder: Secondary | ICD-10-CM | POA: Diagnosis not present

## 2023-07-05 DIAGNOSIS — F5105 Insomnia due to other mental disorder: Secondary | ICD-10-CM

## 2023-07-05 DIAGNOSIS — F40240 Claustrophobia: Secondary | ICD-10-CM

## 2023-07-05 DIAGNOSIS — F3164 Bipolar disorder, current episode mixed, severe, with psychotic features: Secondary | ICD-10-CM | POA: Diagnosis not present

## 2023-07-05 DIAGNOSIS — F411 Generalized anxiety disorder: Secondary | ICD-10-CM | POA: Diagnosis not present

## 2023-07-05 MED ORDER — PROPRANOLOL HCL 10 MG PO TABS
ORAL_TABLET | ORAL | 1 refills | Status: DC
Start: 2023-07-05 — End: 2023-07-28

## 2023-07-05 NOTE — Progress Notes (Signed)
ISABEL VANDERGRIFT [id number] 06/06/1951 72 y.o.    Subjective:   Patient ID:  ZELVA LAMANTIA is a 72 y.o. (DOB 25-Aug-1950) female.  Chief Complaint:  No chief complaint on file.   Anxiety Symptoms include decreased concentration, dizziness and nervous/anxious behavior. Patient reports no chest pain, confusion, palpitations or suicidal ideas.        Sharla Kidney Giesler presents to the office today for follow-up of anxiety and depression and poor cognition.  At her visit October 12, 2018.  Because of her poor cognition which did seem to get even worse lately with her memory and given a negative unremarkable medical work-up by her primary care doctor the decision was made to change her from Xanax which she has been on for years to lorazepam.  Lorazepam often has less cognitive side effects than does alprazolam.  She had a lot of difficulty with headaches and insomnia with the transition.  She called several times in that transition.  Her husband reported that her cognition was better after the transition however.  She was satisfied with the Ativan.  There was an attempt to reduce her lamotrigine to 150 mg also in hopes of improving cognition but it was uncertain at the last visit where there she had actually done so.  visit December 2020 without med changes.  She had continued to do cognitively better off alprazolam and on lorazepam.  seen March 2021.   Better than December visit.  Feeling good and less down.  Adjusting time of med helped excessively sleep.  Brief hypomanic episode resolved where she cleaned all night.  Sleep 10 hours . No med changes.  12/23/19 appt.  Noted: More depression, crying and racing thoughts and distressed to the point of wondering if needed the hospital.  Sleep got worse and needed Seroquel IR 200 also bc went 2 days without sleep.  It worked and helped sleep.  Worry a lot and melancholy this week.  Cries over what happens dealing with her daugher.[name] has  prostate and kidney cancer again.  Had surgery June 10, 2019 and she's cried a lot.  He's incontinent and very uncomfortable.  He usually has positive attitude.   Worry over his health and how she'll do if something happens to him.  She's afraid of Covid.  Trusting in God usually.  She realizes she  Needs to drive occasionally bc of his health.  [name] had prostate CA and lived to 29 yo.  Uncle died of it.  Not markedly depressed. Can't walk much dT plantar fascitis. Also stressed over [name] 70 yo not doing school work and causing problems.  Her father [name] is a bad parent and not helpful.  Stressed over [employer] and downs too and they have periodic conflict.   More down and anxious.   Guilt tendency.   Patient reports difficulty with sleep initiation or maintenance in part DT anxiety and racing thoughts.Intermittent sleep problems with days and nights mixed up.  Not napping. Not manic.[name] Denies appetite disturbance.  Patient reports that energy and motivation have been good.  Patient has some difficulty with concentration.  Chronic memory problems.   Patient denies any suicidal ideation. Plan:   No changes in meds indicated except agree to take the extra Seroquel 200 IR HS for mixed manic sx that are worse right now.  May be having mixed seasonal sx.  02/20/2020 appointment with the following noted: Doing fairly good except chronic worry over [name].  Some  days cries a lot over it.  D having a hard time lately.  Still easily stressed but not more than usual. Stopped extra quetiapine 200 mg HS bc no longer needed it. Anxiety is chronic but at baseline as is depression and not manic now. H helps her take the meds.   Memory is still bad but has been worse when on Xanax instead fo Ativan.  Sleep is fine from 10 to 9 which is much better than in the past.  Occ spells of staying up for 2 days and then needs to take quetiapine.   [name] goes for FU soon for cancer check up. Plan: no med  changes  05/21/20 appt with following noted: "A lot of problems".  Can't drive bc fear and anxiety.  Needed to drive H and couldn't. Fall off toilet twice. Dizzy and HA 2 days ago with a lot of crying and stress with daughter. Leaving home even coming here causes anxiety.    Cataract surgery twice. Fear of Bill being diagnosed with recurrent cancer. Chronic anxiety greather than depression.   Don't know what to do with daughter. Sleep, appetite and energy are OK. Tolerating meds. Sees [name] one day weekly. H helps with meds bc she's forgetful and easily confused.  Loses train of thoughts. Dog has separation anxiety and so they don't go out much.  Plan: no med changes  08/18/20 appt with following noted: 2 ER visitis with vertigo.  RX diazepam and meclizine. Rarely takes former but takes latter regularly. Shakes so bad it made her cry and scream.  Started PT. Continues lorazepam low dose 0.5 mg AM and 1.0 mg HS.  Still has dizziness and vertigo. Memory is still bad and H administers meds with pill box. Chronic anxiety.  Depression manageable. Sleep OK and tolerates meds otherwise. Angelica Chessman driving her crazy. Plan: no med changes  11/17/20 appt with following noted: Has had periods of vertigo and meds for it.  Then had manic sx with cleaning and couldn't sleep for 24 hours. Stopped quetiapine 200 and continued quetiapine XR 800. Also started diazepam 5 mg BID for vertigo and continued lorazepam for anxiety.  No meclizine now.  Vertigo stopped and manic sx stopped.  Tolerating meds now. Still feels Angelica Chessman is mean to her too often and she will cry over it. Mandy's ExH [name] in jail and is the father of Mandy's D [name] 63 yo.  Will be there for 3 mos. Not markedly depressed.  But hates herself some days.  Still memory problems about the same.  Sleeping OK now.  Periods of mood swings. Easily anxious with relatives visiting.   Plan: Mixed manic sx resolved for now. No med changes  today.  02/17/2021 appointment with the following noted: Not good.  "A whole lof of everything."  Shaking for mos. It comes and goes.  Shaking at times makes her think she'll have a nervous breakdown.   Went to ER and dx vertigo.  Has tried meclizine and Valium but scared to take it.  Big shakes again today.  Memory is not good. Sleep to escape anxiety.  Scared to leave home. Not sure what brings on the spells of shaking. Today sx hit her as soon as she awoke.  Plan: No med changes  03/12/2021 phone call: Patient with a lot of anxiety.  Husband reported the patient had been hallucinating and was paranoid that he had left her.  Some confusion over identity of people that were around her.  It was suggested that she have medical work-up because it sounded like she had delirium and may have a UTI causing that. 03/17/2021 psychiatric hospitalization. 03/22/2021 MD response:  Note Patient has been very unstable with bipolar disorder mixed symptoms lately also with some delirium of unknown cause.  She will be seeing her primary care physician tomorrow.  Due to the severity of her symptoms it is suggested that she add haloperidol 2 mg nightly to her current quetiapine 800 mg nightly since we cannot increase the quetiapine dose further.  This may help with her confusion and agitation.  It is okay to talk with her husband because he administers her medication and because she is too confused to understand the instructions.     04/16/21 H called with following:  Pt.'s husband called.  She is currently in [facility] waiting for an inpatient pyschiatric bed.  Pt advised her husband that Dr Jennelle Human told her to stop taking the Lorazepam.  He said he is not aware of that chang in her prescription.  He would like someone to call him and advise if there were any changes to her meds so he stays aware and can let the hospital know if needed. Husband was informed we had not made any medication changes and would not have  suggested she abruptly stopped lorazepam which could cause withdrawal and other kinds of problems.  05/11/2021 appointment with the following noted:  spoke with H for info At hospital was taken off Ativan, buspirone, and Seroquel reduced to 500 mg HS (as 1 of XR 400 mg  and 1/2 of 200 mg IR quetiapine).  Had sedative SE at 200 mg queiapinte !R.  Off risperidone and haloperidol.   Hospitalized at [facility] for 9 days.  They didn't feel communication was good with them. But she is better now than she was.  Had been psychotic PTH.  Last 2-3 days getting more hyper and can't wind down and hard time going to sleep.  Came home 05/03/21. She didn't like the restrictions at the hospital.  No panic lately and wants something to help her sleep. Plan; Mixed manic sx including psychosis recently but not psychotic now but insomnia.. Will get more psychotic if she doesn't sleep but apparently didn't tolerate Seroquel IR 200 mg HS well so will increase to quetiapine IR 150 mg HS and XR 400 mg nightly. Continue lamotrigine.    06/11/21 appt:  [facility]. H thinks she's doing goood. Doing very fine.  No trouble sleeping.  Sometimes in morning feels sad bc she knows she will have racing thoughts, but it resolves after a little while.  Racing thoughts since hospital.  Worries over money chronically. To bed 1015-7:30.  Maybe longer which is normal. H administers meds.  Some wordfinding problems.  Does journal which helps. No SE with meds.  Yesterday was hard with anxiety but today is better.  On lamotrigine 150 BID, SERoquel XR 400 + Seroquel IR 150 mg HS. Plan: Much better but not fully resolved.  Mixed manic sx including psychosis recently but not psychotic but insomnia last visit resolved with the following... apparently didn't tolerate Seroquel IR 200 mg HS well so increased quetiapine IR 150 mg HS and XR 400 mg nightly and now she is sleeping but still having some racing thoughts. Continue lamotrigine  150 BID.    07/14/2021 appointment with the following noted: Bad day yesterday feeling very depressed without anxiety. Not sleeping well lately.  Thinks it's related to  less Seroquel than in the past.  Some EMA.  Always normal slept a lot. Compulsive cleaning and everything in it's place.  Arranges soap dispensers. Cleaning in the middle of the night. No recent psychotic sx but still racing thoughts and chronic $ worries. Still afraid to leave home.  09/20/2021 appt noted: Great overall.  Occ insomnia with EMA.  Still worry but tries not let it consume me.  Still agoraphobia and hates to go out to dentists and doctors.  Challenge to go to dentist.  Can still dread things. Brief nap in daytimes. No SE H administers meds. Seroquel 300 mg HS, XR 400 mg HS.  Lamotrigine 150 Patient reports stable mood and denies depressed or irritable moods.   Patient denies difficulty with sleep initiation or maintenance. Denies appetite disturbance.  Patient reports that energy and motivation have been good.  Patient denies any difficulty with concentration.  Patient denies any suicidal ideation.  01/20/22 appt noted: also talked with [name] Doesn't like waiting 4 mos between appts. Was manic before increasing Seroquel with irritability, not sleeping a couple of days and excessive cleaning. Seroquel increased to IR 400 mg HS.  No SE I feel much Better now.  Doing better at time with anxiety.   Chronic worry over D and GD. GD with psych problems. Sleeping good again. Racing thoughts are better but not gone Tolerating meds.   No further changes desired.   Plan: Seroquel IR 400 mg HS well and XR 400 mg nightly abc recent manic sx better with increase dose. Continue lamotrigine 150 mg twice daily  04/28/2022 appointment noted: Trouble sleeping without extra 100 mg quetiapine HS and takes XR 400 mg and IR 300 mg at 730 pm.  Otherwise will be up until 3 AM.  Sleep from 11 until 830.  Good unless anxious. Doing fairly  well except 2 weeks bad spell from July 31 related to Bill receiving call from woman he worked with 8 years ago and triggered fear ans suspiciousness and insecurity and overwhelmed her.  She cried and over reacted.  Rough 2 weeks but otherwise ok. Real worried about [name] who is self cutting.  Seeing therapist and psychiatrist.  Her father is terrible and inattentive.  She is 70 yo and never happy. I still have agoraphobia and doesn't want to go ut places.  Her dog also has severe anxiety and doesn't like to be alone too.  Still fearul about money.  08/03/22 appt noted: Doing pretty well overall.  Chronic worry over money.  Met old friend on Facebook.  She would contact Marylu Lund a lot.  Was making her sad.  Would cry every time the woman called.  ,This was wearing her out bc woman was hyperverbal.  Cut the woman off.  Since stopped talking to her then no longer crying. When starts to get dark then racing thoughts.  Driving me crazy.  Chronic worry .  Chronic memorhy problems and tendency to be obsessed over fear of dementia. Irritable and confused.  Might nap 30 min and rest her mind and awakens feeling better. Continues the same meds: lamotrigine and quetiapine. Gained 20# last year. Plan: Have pushed Seroquel  as far as possible so start another mood stabilizer Trileptal and increase to 10 mg AM and 300 mg pM  08/09/2022 phone call: She took oxcarbazepine 150 mg Friday and Saturday and developed severe abdominal pain, bowel and bladder incontinence, and impaired vision.  She skipped it on Sunday and symptoms resolved.  She took  it again yesterday and the side effects returned.  States it did help with the racing thoughts but she is unable to tolerate it.  That was just on half of oxcarbazepine tablet. MD response: DC oxcarbazepine due to side effects.  10/17/22 appt noted: Devoria Glassing today. Still racing thoughts and cleaning a lot.  More anxiety than depression.  Rough road.  Worries about  everything.   Thoughts jump from one thing to another.  Can't get away from it.  Can't focus.  Hard to take instruction bc of this.    12/20/22 appt noted: Not good.  Kidney stone. More anxiety last couple of mos. Get something on her mind and she cannot get it off.  Obsesses on things that don't matter.  Trouble going to sleep.  Couldn't go into store the other day dT panic and agoraphobia.   Occ dep but nothing to worry about.  More panic.   Lost 15 # on Monjaro.   Plan start sertraline for anxiety  03/03/23 appt noted: TC Been hosp for kidney px kidney stone.  Glad to be over it.  .   Lost 42 # since April 1 with Monjauro.  Eating better.   Anxiety is better with sertraline 50 mg marked benefit with very little anxiety.  Much happier and less crying.  Better acceptance and self talk better than ever before.   Still some dep which interferes with function at times but not as severe. I'm doing ok.   Tolerating psych meds without problems.  05/03/23 appt noted:  Psych med: lamotrigine 150 BID, Quetiapine ER 400 mg pm and IR 300-400 mg HS, sertraline 50 for anxiety. Real sleepy today after taking quetiapine 200 mg , more than usual.  Only takes it prn.  Usually getting enough sleep.  But sometimes keyed up and can't go to sleep.   Been feeling good overall.  Occ bad days.  For the most part doing fine.   Does "thrifting" which lifts her spirits about 1 time weekly.   It takes her mind off worries.   No marked dep and anxiety is manageable. Lost 55#.   Doing fine with Bill.   [name] 70 yo.  Quiet girl. No med change desired.  07/05/23 appt noted:  In person  Had episode of anxiety with severe worry and obsessing over money at times and bad  episode a couple of weeks ago. Lost 63# on Monjaro.  SE hair thinning.  Brittle nails. Can report many potential SE.  Better mobility now.  "Very nice emotionally".   Now 212# and goal is 199#.   Blood sugar runs in 70's fasting.   No insulin.  Exercising  too.   No sig dep.  I don't have dep like I did years ago but have bad anxiety.  Still can't drive with anxiety and memory px.   Sleep at night is better.  STM is "so bad".   H prepares meds.  Forgets how to do tasks at times.   Terrible anxiety.   Psych med: lamotrigine 150 BID, Quetiapine ER 400 mg pm and IR 300-400 mg HS, sertraline 50 for anxiety. Uses routine to help with memory.     Prior psychiatric medication trials include  paroxetine, Lexapro, sertraline 50 Sertraline 50 daily.   risperidone 4 mg which was sedating, aripiprazole,  perphenazine,  Seroquel 1000, olanzapine for anxiety, lithium which was not tolerated even at very low dosages.  Topamax for anxiety, Oxcarbazepine 150 mg bowel and bladder incontinence  and impaired vision. She was intolerant of both Namenda and Aricept.   Lamotrigine 300.   Xanax, Ativan, diazepam 5 mg BID   This is not an exhaustive list.   Psych hospitalization 04/2021 at [facility] for psychosis.  At the visit in February 2020 Cozetta was more acutely confused recently for no clear reason.   She had a negative medical work-up for causes of short-term memory problems and confusion.  Therefore the decision was made to try switching her from alprazolam to lorazepam at the lowest possible dose in hopes of improving her cognition.  This was successful and her cognition was improved significantly and her husband verified this.    [name] seeing Dr. Tonny Bollman D noted sig anxiety benefit with propranolol   Review of Systems:  Review of Systems  Eyes:  Positive for visual disturbance.  Cardiovascular:  Negative for chest pain and palpitations.  Musculoskeletal:  Positive for arthralgias, back pain and gait problem.  Neurological:  Positive for dizziness. Negative for weakness.  Psychiatric/Behavioral:  Positive for decreased concentration and sleep disturbance. Negative for agitation, behavioral problems, confusion, dysphoric mood, hallucinations,  self-injury and suicidal ideas. The patient is nervous/anxious. The patient is not hyperactive.     Medications: I have reviewed the patient's current medications.  Current Outpatient Medications  Medication Sig Dispense Refill   acetaminophen (TYLENOL) 325 MG tablet Take 650 mg by mouth every 6 (six) hours as needed for mild pain or headache.     amLODipine (NORVASC) 5 MG tablet Take 5 mg by mouth every evening.     aspirin 81 MG tablet Take 81 mg by mouth daily.     famotidine (PEPCID) 20 MG tablet Take 20 mg by mouth in the morning and at bedtime.     furosemide (LASIX) 20 MG tablet Take 20 mg by mouth daily as needed (for severe swelling).     HYDROcodone-acetaminophen (NORCO/VICODIN) 5-325 MG tablet Take 1 tablet by mouth every 4 (four) hours as needed. (Patient taking differently: Take 1 tablet by mouth every 4 (four) hours as needed for moderate pain or severe pain.) 10 tablet 0   lamoTRIgine (LAMICTAL) 150 MG tablet TAKE 1 TABLET TWICE DAILY 180 tablet 3   levocetirizine (XYZAL) 5 MG tablet Take 5 mg by mouth every evening.     levothyroxine (SYNTHROID) 100 MCG tablet Take 100 mcg by mouth daily before breakfast.     lisinopril (ZESTRIL) 20 MG tablet Take 1 tablet (20 mg total) by mouth daily.     metoprolol succinate (TOPROL-XL) 50 MG 24 hr tablet Take 50 mg by mouth daily.     MOUNJARO 5 MG/0.5ML Pen Inject 5 mg into the skin once a week.     Multiple Vitamin (MULTIVITAMIN) tablet Take 1 tablet by mouth daily.     ondansetron (ZOFRAN-ODT) 4 MG disintegrating tablet Take 1 tablet (4 mg total) by mouth every 8 (eight) hours as needed for nausea or vomiting. 12 tablet 0   oxybutynin (DITROPAN) 5 MG tablet Take 1 tablet (5 mg total) by mouth 2 (two) times daily. 60 tablet 0   polyethylene glycol (MIRALAX / GLYCOLAX) packet Take 17 g by mouth 2 (two) times daily as needed for mild constipation.     potassium chloride SA (K-DUR,KLOR-CON) 20 MEQ tablet Take 20 mEq by mouth daily.      QUEtiapine (SEROQUEL XR) 400 MG 24 hr tablet 1 at 730PM 90 tablet 1   QUEtiapine (SEROQUEL) 100 MG tablet Take 100 mg by mouth  at bedtime as needed (Sleep).     QUEtiapine (SEROQUEL) 300 MG tablet TAKE 1 TABLET AT BEDTIME 90 tablet 0   sertraline (ZOLOFT) 50 MG tablet Take 1 tablet (50 mg total) by mouth daily. 90 tablet 1   simvastatin (ZOCOR) 40 MG tablet Take 1 tablet (40 mg total) by mouth every evening. 30 tablet 0   No current facility-administered medications for this visit.    Medication Side Effects: None  Allergies:  Allergies  Allergen Reactions   Maxzide [Triamterene-Hctz] Other (See Comments)    Hurt patient's kidneys   Celexa [Citalopram Hydrobromide] Nausea Only   Metformin And Related Nausea And Vomiting   Other Nausea And Vomiting and Other (See Comments)    Unnamed medication hurt the patient's kidneys   Reglan [Metoclopramide] Nausea Only   Risperdal [Risperidone] Nausea Only and Other (See Comments)    Also caused hallucinations   Trazodone And Nefazodone Nausea Only   Zestril [Lisinopril] Nausea Only   Lithium Palpitations and Other (See Comments)    Shakes and delusional pt started seeing things      Past Medical History:  Diagnosis Date   Anxiety    Arthritis    Asthma    Bipolar 1 disorder (HCC)    CHF (congestive heart failure) (HCC)    Complication of anesthesia    left a bad taste in my mouth it has lasted since january    Depression    Diabetes mellitus    Hernia    umbilical   Hyperlipidemia    Hypertension    Hypothyroidism    Pericarditis, viral 2010 October   Sleep apnea    uses cpap setting of 15    Family History  Problem Relation Age of Onset   Diabetes Mother    Hypertension Mother    Hyperlipidemia Mother    Diabetes Father    Hypertension Father    Hyperlipidemia Father    Hypertension Sister     Social History   Socioeconomic History   Marital status: Married    Spouse name: Not on file   Number of children: Not  on file   Years of education: Not on file   Highest education level: Not on file  Occupational History   Not on file  Tobacco Use   Smoking status: Never   Smokeless tobacco: Never  Substance and Sexual Activity   Alcohol use: No   Drug use: No   Sexual activity: Not on file  Other Topics Concern   Not on file  Social History Narrative   Not on file   Social Determinants of Health   Financial Resource Strain: Not on file  Food Insecurity: No Food Insecurity (12/22/2022)   Hunger Vital Sign    Worried About Running Out of Food in the Last Year: Never true    Ran Out of Food in the Last Year: Never true  Transportation Needs: No Transportation Needs (12/22/2022)   PRAPARE - Administrator, Civil Service (Medical): No    Lack of Transportation (Non-Medical): No  Physical Activity: Not on file  Stress: Not on file  Social Connections: Not on file  Intimate Partner Violence: Not At Risk (12/22/2022)   Humiliation, Afraid, Rape, and Kick questionnaire    Fear of Current or Ex-Partner: No    Emotionally Abused: No    Physically Abused: No    Sexually Abused: No    Past Medical History, Surgical history, Social history, and Family history were reviewed  and updated as appropriate.   Please see review of systems for further details on the patient's review from today.   Objective:   Physical Exam:  There were no vitals taken for this visit.  Physical Exam Neurological:     Mental Status: She is alert and oriented to person, place, and time.     Cranial Nerves: No dysarthria.  Psychiatric:        Attention and Perception: Perception normal. She is inattentive.        Mood and Affect: Mood is anxious. Mood is not depressed. Affect is not labile or tearful.        Speech: Speech is not rapid and pressured or slurred.        Behavior: Behavior is cooperative.        Thought Content: Thought content normal. Thought content is not paranoid or delusional. Thought content  does not include homicidal or suicidal ideation. Thought content does not include suicidal plan.        Cognition and Memory: Cognition normal. She exhibits impaired recent memory.        Judgment: Judgment normal.     Comments: Insight intact Some word-finding issues longterm.  Rambles with poor focus at times but directable.   Talkative.       Lab Review:     Component Value Date/Time   NA 140 12/25/2022 0719   K 4.0 12/25/2022 0719   CL 106 12/25/2022 0719   CO2 26 12/25/2022 0719   GLUCOSE 99 12/25/2022 0719   GLUCOSE 154 (H) 09/01/2006 1100   BUN 13 12/25/2022 0719   CREATININE 0.99 12/25/2022 0719   CREATININE 0.93 07/19/2011 1110   CALCIUM 9.9 12/25/2022 0719   PROT 6.8 12/22/2022 0300   ALBUMIN 3.6 12/22/2022 0300   AST 19 12/22/2022 0300   ALT 15 12/22/2022 0300   ALKPHOS 76 12/22/2022 0300   BILITOT 0.7 12/22/2022 0300   GFRNONAA >60 12/25/2022 0719   GFRAA >60 03/23/2016 1933       Component Value Date/Time   WBC 7.4 12/22/2022 0300   RBC 4.17 12/22/2022 0300   HGB 13.1 12/22/2022 0300   HCT 38.8 12/22/2022 0300   PLT 204 12/22/2022 0300   MCV 93.0 12/22/2022 0300   MCH 31.4 12/22/2022 0300   MCHC 33.8 12/22/2022 0300   RDW 13.6 12/22/2022 0300   LYMPHSABS 1.2 04/15/2021 1831   MONOABS 0.4 04/15/2021 1831   EOSABS 0.1 04/15/2021 1831   BASOSABS 0.1 04/15/2021 1831   Prior lamotrigine level in 2018 on this dosage was 3.6.  Not particularly high.  Per Dr. Delaine Lame: Clinical Impressions: Cognitive complaints are likely due to underlying psychiatric disorder (bipolar disorder/anxiety disorder with racing thoughts). Diagnostic impressions based on test performances are limited due to the patient's severe inability to fully attend to the tasks. However, based on clinical presentation, I highly doubt the presence of a neurodegenerative dementia. It is much more likely that her racing thoughts and psychiatric disorders are interfering with cognitive   No results  found for: "POCLITH", "LITHIUM"   No results found for: "PHENYTOIN", "PHENOBARB", "VALPROATE", "CBMZ"   .res Assessment: Plan:    No diagnosis found.   Liborio Nixon has chronic severe anxiety with panic attacks as well as bipolar disorder with a history of significant lability.  She has had more severe instability with psychotic sx and cognitive problems off and on lately.  S Psych hospitalization 04/2021 at [facility] for psychosis. She also has mild cognitive impairment as  a complicating factor.  She needs her husband's assistance to manage her medications.    She was having more panic attacks and agoraphobia, not wanting to leave the house.  She could not get out of the car to go into a store with her husband earlier in 2024, because of intense fear.  But it is better.  Overall dep better than in prior years and less anxious but not gone.  We discussed the options for treating the anxiety and panic.  First line choice is generally SSRIs but they have a risk of causing mood cycling.  We got her off benzodiazepines so we will be ideal not to restart those if it can be avoided because of their cognitive side effects in her case.  Therefore we tried a low-dose sertraline.  Which appeared to help anxiety a good deal.  She was informed about the risk of mania and other side effects.  sig benefit for anxiety with  sertraline 50 mg daily but worse again.  Consider increase it. .  Continue Seroquel IR 300 mg HS well and XR 400 mg nightly previous manic sx better with increase dose. Trouble sleeping without extra 100 mg quetiapine HS and takes XR 400 mg and IR 300 mg at 730 pm.   Have pushed Seroquel  as far as possible   Continue lamotrigine 150 BID.    Consider switch to Caplyta or Saphris bc lower SE and may help anxiety but probably would not be covered by insurance.  Discussed potential metabolic side effects associated with atypical antipsychotics, as well as potential risk for movement  side effects. Advised pt to contact office if movement side effects occur.  Disc family's fear of LT SE.  However current meds work and without them sx unmanageable.  Has been able to stop BZ   Continue MVI.    Trial low dose propranolol 10-20 mg BID prn anxiety bc it helped D's anxiety.    Pt is chronically needy and requires extended appts DT chronic anxiety and easily stressed but much better with sertraline low dose for anxiety. Supportive and cognitive work are done with the patient on her excessive guilt and now stress of vertigo.  Cannot drive now and wasn't much before.    No evidence of medical cause for STM px.  FHX dementia including mother.   Pt doesn't feel able to fix her meds by herself.  FU 2 mos   Meredith Staggers, MD, DFAPA   Please see After Visit Summary for patient specific instructions.  Future Appointments  Date Time Provider Department Center  07/26/2023 12:50 PM [id number]       No orders of the defined types were placed in this encounter.      -------------------------------

## 2023-07-26 ENCOUNTER — Ambulatory Visit (HOSPITAL_COMMUNITY): Payer: Medicare HMO | Attending: Family Medicine

## 2023-07-26 DIAGNOSIS — G4730 Sleep apnea, unspecified: Secondary | ICD-10-CM | POA: Diagnosis not present

## 2023-07-26 DIAGNOSIS — I082 Rheumatic disorders of both aortic and tricuspid valves: Secondary | ICD-10-CM | POA: Insufficient documentation

## 2023-07-26 DIAGNOSIS — I110 Hypertensive heart disease with heart failure: Secondary | ICD-10-CM | POA: Insufficient documentation

## 2023-07-26 DIAGNOSIS — E119 Type 2 diabetes mellitus without complications: Secondary | ICD-10-CM | POA: Diagnosis not present

## 2023-07-26 DIAGNOSIS — I10 Essential (primary) hypertension: Secondary | ICD-10-CM

## 2023-07-26 DIAGNOSIS — I77810 Thoracic aortic ectasia: Secondary | ICD-10-CM | POA: Diagnosis not present

## 2023-07-26 DIAGNOSIS — I509 Heart failure, unspecified: Secondary | ICD-10-CM | POA: Insufficient documentation

## 2023-07-26 DIAGNOSIS — E785 Hyperlipidemia, unspecified: Secondary | ICD-10-CM | POA: Insufficient documentation

## 2023-07-26 LAB — ECHOCARDIOGRAM COMPLETE
AR max vel: 1.53 cm2
AV Area VTI: 1.84 cm2
AV Area mean vel: 1.55 cm2
AV Mean grad: 13 mm[Hg]
AV Peak grad: 24.8 mm[Hg]
Ao pk vel: 2.49 m/s
Calc EF: 62 %
S' Lateral: 2.27 cm
Single Plane A2C EF: 64.1 %
Single Plane A4C EF: 59.7 %

## 2023-07-27 ENCOUNTER — Other Ambulatory Visit: Payer: Self-pay | Admitting: Psychiatry

## 2023-07-27 DIAGNOSIS — F4001 Agoraphobia with panic disorder: Secondary | ICD-10-CM

## 2023-07-27 DIAGNOSIS — F411 Generalized anxiety disorder: Secondary | ICD-10-CM

## 2023-08-04 ENCOUNTER — Other Ambulatory Visit: Payer: Self-pay | Admitting: Family Medicine

## 2023-08-04 DIAGNOSIS — F319 Bipolar disorder, unspecified: Secondary | ICD-10-CM | POA: Diagnosis not present

## 2023-08-04 DIAGNOSIS — I350 Nonrheumatic aortic (valve) stenosis: Secondary | ICD-10-CM | POA: Diagnosis not present

## 2023-08-04 DIAGNOSIS — E2839 Other primary ovarian failure: Secondary | ICD-10-CM

## 2023-08-04 DIAGNOSIS — E039 Hypothyroidism, unspecified: Secondary | ICD-10-CM | POA: Diagnosis not present

## 2023-08-04 DIAGNOSIS — I10 Essential (primary) hypertension: Secondary | ICD-10-CM | POA: Diagnosis not present

## 2023-08-04 DIAGNOSIS — R0789 Other chest pain: Secondary | ICD-10-CM | POA: Diagnosis not present

## 2023-08-04 DIAGNOSIS — I5189 Other ill-defined heart diseases: Secondary | ICD-10-CM | POA: Diagnosis not present

## 2023-08-04 DIAGNOSIS — K219 Gastro-esophageal reflux disease without esophagitis: Secondary | ICD-10-CM | POA: Diagnosis not present

## 2023-08-04 DIAGNOSIS — I77810 Thoracic aortic ectasia: Secondary | ICD-10-CM | POA: Diagnosis not present

## 2023-09-01 ENCOUNTER — Other Ambulatory Visit: Payer: Self-pay | Admitting: Psychiatry

## 2023-09-01 DIAGNOSIS — F3164 Bipolar disorder, current episode mixed, severe, with psychotic features: Secondary | ICD-10-CM

## 2023-09-06 ENCOUNTER — Encounter: Payer: Self-pay | Admitting: Psychiatry

## 2023-09-06 ENCOUNTER — Telehealth: Payer: Medicare HMO | Admitting: Psychiatry

## 2023-09-06 DIAGNOSIS — F4001 Agoraphobia with panic disorder: Secondary | ICD-10-CM | POA: Diagnosis not present

## 2023-09-06 DIAGNOSIS — F3164 Bipolar disorder, current episode mixed, severe, with psychotic features: Secondary | ICD-10-CM | POA: Diagnosis not present

## 2023-09-06 DIAGNOSIS — R7989 Other specified abnormal findings of blood chemistry: Secondary | ICD-10-CM | POA: Diagnosis not present

## 2023-09-06 DIAGNOSIS — F5105 Insomnia due to other mental disorder: Secondary | ICD-10-CM

## 2023-09-06 DIAGNOSIS — F411 Generalized anxiety disorder: Secondary | ICD-10-CM | POA: Diagnosis not present

## 2023-09-06 DIAGNOSIS — F40240 Claustrophobia: Secondary | ICD-10-CM

## 2023-09-06 DIAGNOSIS — G3184 Mild cognitive impairment, so stated: Secondary | ICD-10-CM

## 2023-09-06 MED ORDER — MIRTAZAPINE 15 MG PO TABS: 15.0000 mg | ORAL_TABLET | Freq: Every day | ORAL | 0 refills | Status: DC

## 2023-09-06 NOTE — Progress Notes (Signed)
 [name] [id number] [birth year] 73 y.o.   Video Visit via My Chart  I connected with pt by video using My Chart and verified that I am speaking with the correct person using two identifiers.   I discussed the limitations, risks, security and privacy concerns of performing an evaluation and management service by My Chart  and the availability of in person appointments. I also discussed with the patient that there may be a patient responsible charge related to this service. The patient expressed understanding and agreed to proceed.  I discussed the assessment and treatment plan with the patient. The patient was provided an opportunity to ask questions and all were answered. The patient agreed with the plan and demonstrated an understanding of the instructions.   The patient was advised to call back or seek an in-person evaluation if the symptoms worsen or if the condition fails to improve as anticipated.  I provided 30 minutes of video time during this encounter.  The patient was located at home and the provider was located office. Session 200-230 pm  Subjective:   Patient ID:  [name] is a 73 y.o. (DOB 1951-05-12) female.  Chief Complaint:  Chief Complaint  Patient presents with   Follow-up   Depression   Anxiety   Memory Loss   Sleeping Problem    Anxiety Symptoms include decreased concentration, dizziness and nervous/anxious behavior. Patient reports no chest pain, confusion, palpitations or suicidal ideas.        [name] presents to the office today for follow-up of anxiety and depression and poor cognition.  At her visit October 12, 2018.  Because of her poor cognition which did seem to get even worse lately with her memory and given a negative unremarkable medical work-up by her primary care doctor the decision was made to change her from Xanax  which she has been on for years to lorazepam .  Lorazepam  often has less cognitive side effects than does  alprazolam .  She had a lot of difficulty with headaches and insomnia with the transition.  She called several times in that transition.  Her husband reported that her cognition was better after the transition however.  She was satisfied with the Ativan .  There was an attempt to reduce her lamotrigine  to 150 mg also in hopes of improving cognition but it was uncertain at the last visit where there she had actually done so.  visit December 2020 without med changes.  She had continued to do cognitively better off alprazolam  and on lorazepam .  seen March 2021.   Better than December visit.  Feeling good and less down.  Adjusting time of med helped excessively sleep.  Brief hypomanic episode resolved where she cleaned all night.  Sleep 10 hours . No med changes.  12/23/19 appt.  Noted: More depression, crying and racing thoughts and distressed to the point of wondering if needed the hospital.  Sleep got worse and needed Seroquel  IR 200 also bc went 2 days without sleep.  It worked and helped sleep.  Worry a lot and melancholy this week.  Cries over what happens dealing with her daugher.[name] has prostate and kidney cancer again.  Had surgery June 10, 2019 and she's cried a lot.  He's incontinent and very uncomfortable.  He usually has positive attitude.   Worry over his health and how she'll do if something happens to him.  She's afraid of Covid.  Trusting in God usually.  She realizes she  Needs to drive  occasionally bc of his health.  [name] had prostate CA and lived to 66 yo.  Uncle died of it.  Not markedly depressed. Can't walk much dT plantar fascitis. Also stressed over [name] 71 yo not doing school work and causing problems.  Her father Lesia Raring is a bad parent and not helpful.  Stressed over [employer] and downs too and they have periodic conflict.   More down and anxious.   Guilt tendency.   Patient reports difficulty with sleep initiation or maintenance in part DT anxiety and racing  thoughts.Intermittent sleep problems with days and nights mixed up.  Not napping. Not manic.[name] Denies appetite disturbance.  Patient reports that energy and motivation have been good.  Patient has some difficulty with concentration.  Chronic memory problems.   Patient denies any suicidal ideation. Plan:   No changes in meds indicated except agree to take the extra Seroquel  200 IR HS for mixed manic sx that are worse right now.  May be having mixed seasonal sx.  02/20/2020 appointment with the following noted: Doing fairly good except chronic worry over [name].  Some days cries a lot over it.  D having a hard time lately.  Still easily stressed but not more than usual. Stopped extra quetiapine  200 mg HS bc no longer needed it. Anxiety is chronic but at baseline as is depression and not manic now. H helps her take the meds.   Memory is still bad but has been worse when on Xanax  instead fo Ativan .  Sleep is fine from 10 to 9 which is much better than in the past.  Occ spells of staying up for 2 days and then needs to take quetiapine .   [name] goes for FU soon for cancer check up. Plan: no med changes  05/21/20 appt with following noted: "A lot of problems".  Can't drive bc fear and anxiety.  Needed to drive H and couldn't. Fall off toilet twice. Dizzy and HA 2 days ago with a lot of crying and stress with daughter. Leaving home even coming here causes anxiety.    Cataract surgery twice. Fear of Bill being diagnosed with recurrent cancer. Chronic anxiety greather than depression.   Don't know what to do with daughter. Sleep, appetite and energy are OK. Tolerating meds. Sees [name] one day weekly. H helps with meds bc she's forgetful and easily confused.  Loses train of thoughts. Dog has separation anxiety and so they don't go out much.  Plan: no med changes  08/18/20 appt with following noted: 2 ER visitis with vertigo.  RX diazepam  and meclizine . Rarely takes former but takes latter  regularly. Shakes so bad it made her cry and scream.  Started PT. Continues lorazepam  low dose 0.5 mg AM and 1.0 mg HS.  Still has dizziness and vertigo. Memory is still bad and H administers meds with pill box. Chronic anxiety.  Depression manageable. Sleep OK and tolerates meds otherwise. Ethyl Hering driving her crazy. Plan: no med changes  11/17/20 appt with following noted: Has had periods of vertigo and meds for it.  Then had manic sx with cleaning and couldn't sleep for 24 hours. Stopped quetiapine  200 and continued quetiapine  XR 800. Also started diazepam  5 mg BID for vertigo and continued lorazepam  for anxiety.  No meclizine  now.  Vertigo stopped and manic sx stopped.  Tolerating meds now. Still feels Ethyl Hering is mean to her too often and she will cry over it. Mandy's ExH [name] in jail and  is the father of Mandy's D [name] 36 yo.  Will be there for 3 mos. Not markedly depressed.  But hates herself some days.  Still memory problems about the same.  Sleeping OK now.  Periods of mood swings. Easily anxious with relatives visiting.   Plan: Mixed manic sx resolved for now. No med changes today.  02/17/2021 appointment with the following noted: Not good.  "A whole lof of everything."  Shaking for mos. It comes and goes.  Shaking at times makes her think she'll have a nervous breakdown.   Went to ER and dx vertigo.  Has tried meclizine  and Valium  but scared to take it.  Big shakes again today.  Memory is not good. Sleep to escape anxiety.  Scared to leave home. Not sure what brings on the spells of shaking. Today sx hit her as soon as she awoke.  Plan: No med changes  03/12/2021 phone call: Patient with a lot of anxiety.  Husband reported the patient had been hallucinating and was paranoid that he had left her.  Some confusion over identity of people that were around her.  It was suggested that she have medical work-up because it sounded like she had delirium and may have a UTI causing  that. 03/17/2021 psychiatric hospitalization. 03/22/2021 MD response:  Note Patient has been very unstable with bipolar disorder mixed symptoms lately also with some delirium of unknown cause.  She will be seeing her primary care physician tomorrow.  Due to the severity of her symptoms it is suggested that she add haloperidol  2 mg nightly to her current quetiapine  800 mg nightly since we cannot increase the quetiapine  dose further.  This may help with her confusion and agitation.  It is okay to talk with her husband because he administers her medication and because she is too confused to understand the instructions.     04/16/21 H called with following:  Pt.'s husband called.  She is currently in [facility] waiting for an inpatient pyschiatric bed.  Pt advised her husband that Dr Toi Foster told her to stop taking the Lorazepam .  He said he is not aware of that chang in her prescription.  He would like someone to call him and advise if there were any changes to her meds so he stays aware and can let the hospital know if needed. Husband was informed we had not made any medication changes and would not have suggested she abruptly stopped lorazepam  which could cause withdrawal and other kinds of problems.  05/11/2021 appointment with the following noted:  spoke with H for info At hospital was taken off Ativan , buspirone , and Seroquel  reduced to 500 mg HS (as 1 of XR 400 mg  and 1/2 of 200 mg IR quetiapine ).  Had sedative SE at 200 mg queiapinte !R.  Off risperidone  and haloperidol .   Hospitalized at [facility] for 9 days.  They didn't feel communication was good with them. But she is better now than she was.  Had been psychotic PTH.  Last 2-3 days getting more hyper and can't wind down and hard time going to sleep.  Came home 05/03/21. She didn't like the restrictions at the hospital.  No panic lately and wants something to help her sleep. Plan; Mixed manic sx including psychosis recently but not  psychotic now but insomnia.. Will get more psychotic if she doesn't sleep but apparently didn't tolerate Seroquel  IR 200 mg HS well so will increase to quetiapine  IR 150 mg HS and  XR 400 mg nightly. Continue lamotrigine .    06/11/21 appt:  [facility]. H thinks she's doing goood. Doing very fine.  No trouble sleeping.  Sometimes in morning feels sad bc she knows she will have racing thoughts, but it resolves after a little while.  Racing thoughts since hospital.  Worries over money chronically. To bed 1015-7:30.  Maybe longer which is normal. H administers meds.  Some wordfinding problems.  Does journal which helps. No SE with meds.  Yesterday was hard with anxiety but today is better.  On lamotrigine  150 BID, SERoquel  XR 400 + Seroquel  IR 150 mg HS. Plan: Much better but not fully resolved.  Mixed manic sx including psychosis recently but not psychotic but insomnia last visit resolved with the following... apparently didn't tolerate Seroquel  IR 200 mg HS well so increased quetiapine  IR 150 mg HS and XR 400 mg nightly and now she is sleeping but still having some racing thoughts. Continue lamotrigine  150 BID.    07/14/2021 appointment with the following noted: Bad day yesterday feeling very depressed without anxiety. Not sleeping well lately.  Thinks it's related to less Seroquel  than in the past.  Some EMA.  Always normal slept a lot. Compulsive cleaning and everything in it's place.  Arranges soap dispensers. Cleaning in the middle of the night. No recent psychotic sx but still racing thoughts and chronic $ worries. Still afraid to leave home.  09/20/2021 appt noted: Great overall.  Occ insomnia with EMA.  Still worry but tries not let it consume me.  Still agoraphobia and hates to go out to dentists and doctors.  Challenge to go to dentist.  Can still dread things. Brief nap in daytimes. No SE H administers meds. Seroquel  300 mg HS, XR 400 mg HS.  Lamotrigine  150 Patient reports stable mood  and denies depressed or irritable moods.   Patient denies difficulty with sleep initiation or maintenance. Denies appetite disturbance.  Patient reports that energy and motivation have been good.  Patient denies any difficulty with concentration.  Patient denies any suicidal ideation.  01/20/22 appt noted: also talked with [name] Doesn't like waiting 4 mos between appts. Was manic before increasing Seroquel  with irritability, not sleeping a couple of days and excessive cleaning. Seroquel  increased to IR 400 mg HS.  No SE I feel much Better now.  Doing better at time with anxiety.   Chronic worry over D and GD. GD with psych problems. Sleeping good again. Racing thoughts are better but not gone Tolerating meds.   No further changes desired.   Plan: Seroquel  IR 400 mg HS well and XR 400 mg nightly abc recent manic sx better with increase dose. Continue lamotrigine  150 mg twice daily  04/28/2022 appointment noted: Trouble sleeping without extra 100 mg quetiapine  HS and takes XR 400 mg and IR 300 mg at 730 pm.  Otherwise will be up until 3 AM.  Sleep from 11 until 830.  Good unless anxious. Doing fairly well except 2 weeks bad spell from July 31 related to Bill receiving call from woman he worked with 8 years ago and triggered fear ans suspiciousness and insecurity and overwhelmed her.  She cried and over reacted.  Rough 2 weeks but otherwise ok. Real worried about [name] who is self cutting.  Seeing therapist and psychiatrist.  Her father is terrible and inattentive.  She is 73 yo and never happy. I still have agoraphobia and doesn't want to go ut places.  Her dog also has severe anxiety  and doesn't like to be alone too.  Still fearul about money.  08/03/22 appt noted: Doing pretty well overall.  Chronic worry over money.  Met old friend on Facebook.  She would contact [name] a lot.  Was making her sad.  Would cry every time the woman called.  ,This was wearing her out bc woman was hyperverbal.  Cut the  woman off.  Since stopped talking to her then no longer crying. When starts to get dark then racing thoughts.  Driving me crazy.  Chronic worry .  Chronic memorhy problems and tendency to be obsessed over fear of dementia. Irritable and confused.  Might nap 30 min and rest her mind and awakens feeling better. Continues the same meds: lamotrigine  and quetiapine . Gained 20# last year. Plan: Have pushed Seroquel   as far as possible so start another mood stabilizer Trileptal  and increase to 10 mg AM and 300 mg pM  08/09/2022 phone call: She took oxcarbazepine  150 mg Friday and Saturday and developed severe abdominal pain, bowel and bladder incontinence, and impaired vision.  She skipped it on Sunday and symptoms resolved.  She took it again yesterday and the side effects returned.  States it did help with the racing thoughts but she is unable to tolerate it.  That was just on half of oxcarbazepine  tablet. MD response: DC oxcarbazepine  due to side effects.  10/17/22 appt noted: Started Monjauro today. Still racing thoughts and cleaning a lot.  More anxiety than depression.  Rough road.  Worries about everything.   Thoughts jump from one thing to another.  Can't get away from it.  Can't focus.  Hard to take instruction bc of this.    12/20/22 appt noted: Not good.  Kidney stone. More anxiety last couple of mos. Get something on her mind and she cannot get it off.  Obsesses on things that don't matter.  Trouble going to sleep.  Couldn't go into store the other day dT panic and agoraphobia.   Occ dep but nothing to worry about.  More panic.   Lost 15 # on Monjaro.   Plan start sertraline  for anxiety  03/03/23 appt noted: TC Been hosp for kidney px kidney stone.  Glad to be over it.  .   Lost 42 # since April 1 with Monjauro.  Eating better.   Anxiety is better with sertraline  50 mg marked benefit with very little anxiety.  Much happier and less crying.  Better acceptance and self talk better than ever  before.   Still some dep which interferes with function at times but not as severe. I'm doing ok.   Tolerating psych meds without problems.  05/03/23 appt noted:  Psych med: lamotrigine  150 BID, Quetiapine  ER 400 mg pm and IR 300-400 mg HS, sertraline  50 for anxiety. Real sleepy today after taking quetiapine  200 mg , more than usual.  Only takes it prn.  Usually getting enough sleep.  But sometimes keyed up and can't go to sleep.   Been feeling good overall.  Occ bad days.  For the most part doing fine.   Does "thrifting" which lifts her spirits about 1 time weekly.   It takes her mind off worries.   No marked dep and anxiety is manageable. Lost 55#.   Doing fine with Bill.   [name] 71 yo.  Quiet girl. No med change desired.  07/05/23 appt noted:  In person  Had episode of anxiety with severe worry and obsessing over money at times and bad  episode a couple of weeks ago. Lost 63# on Monjaro.  SE hair thinning.  Brittle nails. Can report many potential SE.  Better mobility now.  "Very nice emotionally".   Now 212# and goal is 199#.   Blood sugar runs in 70's fasting.   No insulin .  Exercising too.   No sig dep.  I don't have dep like I did years ago but have bad anxiety.  Still can't drive with anxiety and memory px.   Sleep at night is better.  STM is "so bad".   H prepares meds.  Forgets how to do tasks at times.   Terrible anxiety.   Psych med: lamotrigine  150 BID, Quetiapine  ER 400 mg pm and IR 300-400 mg HS, sertraline  50 for anxiety. Uses routine to help with memory.   Plan: Trial low dose propranolol  10-20 mg BID prn anxiety bc it helped D's anxiety.    09/06/23 apppt noted: Psych med: lamotrigine  150 BID, Quetiapine  ER 400 mg pm and IR 300-400 mg HS, sertraline  50 for anxiety. Tried propranolol  for anxiety up to 20 BID.  It does help some.  Will get stubborn and not take it.   Had a lot anxiety and trouble sleeping.  When bad I clean like the devil.  Only 1 hour sleep last night.     Hard to learn new things since childhood like technology.  Can't use tablet without his help.   Last night took extra 100 mg Seroquel  and didn't help. No trigger for more anxiety, tension, scared.  Even real nervous before MD called.   Lost 62#  on Mounjaro.  Feels better physically.   Some people tell her she has dementia.    Worried Seroquel  might cause that.  No clear pcpt bc long term memory problems.  Can't deal with thinking I might have dementia.   Chronic anxiety since childhood.   Has a friend who calls and a lot and chronically is negative and complains all the time.  Sleep worse for 2-3 weeks.      Prior psychiatric medication trials include  paroxetine , Lexapro, sertraline  50 Sertraline  50 daily.   risperidone  4 mg which was sedating, aripiprazole,  perphenazine,  Seroquel  1000, olanzapine for anxiety, lithium which was not tolerated even at very low dosages.  Topamax for anxiety, Oxcarbazepine  150 mg bowel and bladder incontinence and impaired vision. She was intolerant of both Namenda and Aricept.   Lamotrigine  300.   Xanax , Ativan , diazepam  5 mg BID   This is not an exhaustive list.   Psych hospitalization 04/2021 at [facility] for psychosis.  At the visit in February 2020 Bobbi was more acutely confused recently for no clear reason.   She had a negative medical work-up for causes of short-term memory problems and confusion.  Therefore the decision was made to try switching her from alprazolam  to lorazepam  at the lowest possible dose in hopes of improving her cognition.  This was successful and her cognition was improved significantly and her husband verified this.    [name] seeing Dr. Dominica Friends D noted sig anxiety benefit with propranolol   Neuropsych testing mid 2019 at [facility] but results not on chart.    Review of Systems:  Review of Systems  Eyes:  Positive for visual disturbance.  Cardiovascular:  Negative for chest pain and palpitations.  Musculoskeletal:   Positive for arthralgias, back pain and gait problem.  Neurological:  Positive for dizziness. Negative for weakness.  Psychiatric/Behavioral:  Positive for decreased concentration and sleep  disturbance. Negative for agitation, behavioral problems, confusion, dysphoric mood, hallucinations, self-injury and suicidal ideas. The patient is nervous/anxious. The patient is not hyperactive.     Medications: I have reviewed the patient's current medications.  Current Outpatient Medications  Medication Sig Dispense Refill   acetaminophen  (TYLENOL ) 325 MG tablet Take 650 mg by mouth every 6 (six) hours as needed for mild pain or headache.     amLODipine  (NORVASC ) 5 MG tablet Take 5 mg by mouth every evening.     aspirin  81 MG tablet Take 81 mg by mouth daily.     famotidine  (PEPCID ) 20 MG tablet Take 20 mg by mouth in the morning and at bedtime.     furosemide  (LASIX ) 20 MG tablet Take 20 mg by mouth daily as needed (for severe swelling).     lamoTRIgine  (LAMICTAL ) 150 MG tablet TAKE 1 TABLET TWICE DAILY 180 tablet 0   levocetirizine (XYZAL ) 5 MG tablet Take 5 mg by mouth every evening.     levothyroxine  (SYNTHROID ) 100 MCG tablet Take 100 mcg by mouth daily before breakfast.     lisinopril  (ZESTRIL ) 20 MG tablet Take 1 tablet (20 mg total) by mouth daily.     metoprolol  succinate (TOPROL -XL) 50 MG 24 hr tablet Take 50 mg by mouth daily.     mirtazapine  (REMERON ) 15 MG tablet Take 1 tablet (15 mg total) by mouth at bedtime. 90 tablet 0   MOUNJARO 5 MG/0.5ML Pen Inject 5 mg into the skin once a week.     Multiple Vitamin (MULTIVITAMIN) tablet Take 1 tablet by mouth daily.     polyethylene glycol (MIRALAX  / GLYCOLAX ) packet Take 17 g by mouth 2 (two) times daily as needed for mild constipation.     potassium chloride  SA (K-DUR,KLOR-CON ) 20 MEQ tablet Take 20 mEq by mouth daily.     propranolol  (INDERAL ) 10 MG tablet TAKE 1 TO 2 TABLETS BY MOUTH TWICE A DAY AS NEEDED FOR ANXIETY 360 tablet 0   QUEtiapine   (SEROQUEL  XR) 400 MG 24 hr tablet 1 at 730PM 90 tablet 1   QUEtiapine  (SEROQUEL ) 100 MG tablet Take 100 mg by mouth at bedtime as needed (Sleep).     QUEtiapine  (SEROQUEL ) 300 MG tablet TAKE 1 TABLET AT BEDTIME 90 tablet 0   sertraline  (ZOLOFT ) 50 MG tablet Take 1 tablet (50 mg total) by mouth daily. 90 tablet 1   simvastatin  (ZOCOR ) 40 MG tablet Take 1 tablet (40 mg total) by mouth every evening. 30 tablet 0   No current facility-administered medications for this visit.    Medication Side Effects: None  Allergies:  Allergies  Allergen Reactions   Maxzide [Triamterene-Hctz] Other (See Comments)    Hurt patient's kidneys   Celexa [Citalopram Hydrobromide] Nausea Only   Metformin And Related Nausea And Vomiting   Other Nausea And Vomiting and Other (See Comments)    Unnamed medication hurt the patient's kidneys   Reglan  [Metoclopramide ] Nausea Only   Risperdal  [Risperidone ] Nausea Only and Other (See Comments)    Also caused hallucinations   Trazodone And Nefazodone Nausea Only   Zestril  [Lisinopril ] Nausea Only   Lithium Palpitations and Other (See Comments)    Shakes and delusional pt started seeing things      Past Medical History:  Diagnosis Date   Anxiety    Arthritis    Asthma    Bipolar 1 disorder (HCC)    CHF (congestive heart failure) (HCC)    Complication of anesthesia    left a  bad taste in my mouth it has lasted since january    Depression    Diabetes mellitus    Hernia    umbilical   Hyperlipidemia    Hypertension    Hypothyroidism    Pericarditis, viral 2010 October   Sleep apnea    uses cpap setting of 15    Family History  Problem Relation Age of Onset   Diabetes Mother    Hypertension Mother    Hyperlipidemia Mother    Diabetes Father    Hypertension Father    Hyperlipidemia Father    Hypertension Sister     Social History   Socioeconomic History   Marital status: Married    Spouse name: Not on file   Number of children: Not on file    Years of education: Not on file   Highest education level: Not on file  Occupational History   Not on file  Tobacco Use   Smoking status: Never   Smokeless tobacco: Never  Substance and Sexual Activity   Alcohol use: No   Drug use: No   Sexual activity: Not on file  Other Topics Concern   Not on file  Social History Narrative   Not on file   Social Drivers of Health   Financial Resource Strain: Not on file  Food Insecurity: No Food Insecurity (12/22/2022)   Hunger Vital Sign    Worried About Running Out of Food in the Last Year: Never true    Ran Out of Food in the Last Year: Never true  Transportation Needs: No Transportation Needs (12/22/2022)   PRAPARE - Administrator, Civil Service (Medical): No    Lack of Transportation (Non-Medical): No  Physical Activity: Not on file  Stress: Not on file  Social Connections: Not on file  Intimate Partner Violence: Not At Risk (12/22/2022)   Humiliation, Afraid, Rape, and Kick questionnaire    Fear of Current or Ex-Partner: No    Emotionally Abused: No    Physically Abused: No    Sexually Abused: No    Past Medical History, Surgical history, Social history, and Family history were reviewed and updated as appropriate.   Please see review of systems for further details on the patient's review from today.   Objective:   Physical Exam:  There were no vitals taken for this visit.  Physical Exam Neurological:     Mental Status: She is alert and oriented to person, place, and time.     Cranial Nerves: No dysarthria.  Psychiatric:        Attention and Perception: Perception normal. She is inattentive.        Mood and Affect: Mood is anxious. Mood is not depressed. Affect is not labile or tearful.        Speech: Speech is not rapid and pressured or slurred.        Behavior: Behavior is cooperative.        Thought Content: Thought content normal. Thought content is not paranoid or delusional. Thought content does not include  homicidal or suicidal ideation. Thought content does not include suicidal plan.        Cognition and Memory: Cognition normal. She exhibits impaired recent memory.        Judgment: Judgment normal.     Comments: Insight intact Some word-finding issues longterm and seems worse.  Rambles with poor focus at times but directable.   Anxiety worse and sleep worse. Talkative.       Lab  Review:     Component Value Date/Time   NA 140 12/25/2022 0719   K 4.0 12/25/2022 0719   CL 106 12/25/2022 0719   CO2 26 12/25/2022 0719   GLUCOSE 99 12/25/2022 0719   GLUCOSE 154 (H) 09/01/2006 1100   BUN 13 12/25/2022 0719   CREATININE 0.99 12/25/2022 0719   CREATININE 0.93 07/19/2011 1110   CALCIUM 9.9 12/25/2022 0719   PROT 6.8 12/22/2022 0300   ALBUMIN 3.6 12/22/2022 0300   AST 19 12/22/2022 0300   ALT 15 12/22/2022 0300   ALKPHOS 76 12/22/2022 0300   BILITOT 0.7 12/22/2022 0300   GFRNONAA >60 12/25/2022 0719   GFRAA >60 03/23/2016 1933       Component Value Date/Time   WBC 7.4 12/22/2022 0300   RBC 4.17 12/22/2022 0300   HGB 13.1 12/22/2022 0300   HCT 38.8 12/22/2022 0300   PLT 204 12/22/2022 0300   MCV 93.0 12/22/2022 0300   MCH 31.4 12/22/2022 0300   MCHC 33.8 12/22/2022 0300   RDW 13.6 12/22/2022 0300   LYMPHSABS 1.2 04/15/2021 1831   MONOABS 0.4 04/15/2021 1831   EOSABS 0.1 04/15/2021 1831   BASOSABS 0.1 04/15/2021 1831   Prior lamotrigine  level in 2018 on this dosage was 3.6.  Not particularly high.  Per Dr. Scherrie Curt: Clinical Impressions: Cognitive complaints are likely due to underlying psychiatric disorder (bipolar disorder/anxiety disorder with racing thoughts). Diagnostic impressions based on test performances are limited due to the patient's severe inability to fully attend to the tasks. However, based on clinical presentation, I highly doubt the presence of a neurodegenerative dementia. It is much more likely that her racing thoughts and psychiatric disorders are interfering  with cognitive   No results found for: "POCLITH", "LITHIUM"   No results found for: "PHENYTOIN", "PHENOBARB", "VALPROATE", "CBMZ"   .res Assessment: Plan:    Bipolar affective disorder, mixed, severe, with psychotic behavior (HCC)  Generalized anxiety disorder - Plan: mirtazapine  (REMERON ) 15 MG tablet  Panic disorder with agoraphobia - Plan: mirtazapine  (REMERON ) 15 MG tablet  Claustrophobia  Mild cognitive impairment  Insomnia due to mental condition - Plan: mirtazapine  (REMERON ) 15 MG tablet  Low vitamin D  level   Leola Raisin has chronic severe anxiety with panic attacks as well as bipolar disorder with a history of significant lability.  She has had more severe instability with psychotic sx and cognitive problems off and on lately.  S Psych hospitalization 04/2021 at [facility] for psychosis. She also has mild cognitive impairment as a complicating factor.  She needs her husband's assistance to manage her medications.    She has panic attacks and agoraphobia, not wanting to leave the house.  She could not get out of the car to go into a store with her husband earlier in 2024, because of intense fear.  But it is better.  More anxious with insomnia lately.    We discussed the options for treating the anxiety and panic.  First line choice is generally SSRIs but they have a risk of causing mood cycling.  We got her off benzodiazepines so we will be ideal not to restart those if it can be avoided because of their cognitive side effects in her case.  Therefore we tried a low-dose sertraline .  Which appeared to help anxiety a good deal.  She was informed about the risk of mania and other side effects.  Had sig benefit for anxiety with  sertraline  50 mg daily but worse again.  Consider increase it but  Hesitate to increase bc not sleeping much now. DC sertraline  and start mirtazapine  15 mg HS for sleep and anxiety  Continue Seroquel  IR 300 mg HS well and XR 400 mg nightly previous  manic sx better with increase dose. Trouble sleeping without extra 100 mg quetiapine  HS and takes XR 400 mg and IR 300 mg at 730 pm.   Have pushed Seroquel   as far as possible   Continue lamotrigine  150 BID.    Consider switch to Caplyta or Saphris bc lower SE and may help anxiety but probably would not be covered by insurance.  Discussed potential metabolic side effects associated with atypical antipsychotics, as well as potential risk for movement side effects. Advised pt to contact office if movement side effects occur.  Disc family's fear of LT SE.  However current meds work and without them sx unmanageable.  Has been able to stop BZ   Continue MVI.    Continue low dose propranolol  10-20 mg BID prn anxiety bc it helped D's anxiety.    Pt is chronically needy and requires extended appts DT chronic anxiety and easily stressed but much better with sertraline  low dose for anxiety. Supportive and cognitive work are done with the patient on her excessive guilt and now stress of vertigo.  Cannot drive now and wasn't much before.    No evidence of medical cause for STM px.  FHX dementia including mother.   Pt doesn't feel able to fix her meds by herself.  Gives up quickly on task demands.    FU 2 mos   Nori Beat, MD, DFAPA   Please see After Visit Summary for patient specific instructions. Stop sertraline  bc not helping anxiety anymore. Start mirtazapine  15 mg tablet 1 at night for sleep and anxiety  Nori Beat, MD, DFAPA   Future Appointments  Date Time Provider Department Center  09/14/2023  2:00 PM Charity Conch, [place] [facility]  09/25/2023 11:20 AM Knox Perl, [facility]  03/28/2024  1:30 PM [facility] 1 [facility]       No orders of the defined types were placed in this encounter.      -------------------------------

## 2023-09-06 NOTE — Patient Instructions (Signed)
 Stop sertraline  bc not helping anxiety anymore. Start mirtazapine  15 mg tablet 1 at night for sleep and anxiety

## 2023-09-11 ENCOUNTER — Other Ambulatory Visit: Payer: Self-pay | Admitting: Psychiatry

## 2023-09-11 DIAGNOSIS — F3164 Bipolar disorder, current episode mixed, severe, with psychotic features: Secondary | ICD-10-CM

## 2023-09-14 ENCOUNTER — Ambulatory Visit: Payer: Medicare HMO | Admitting: Podiatry

## 2023-09-25 ENCOUNTER — Ambulatory Visit: Payer: Medicare HMO | Admitting: Cardiology

## 2023-09-25 ENCOUNTER — Ambulatory Visit: Payer: Medicare HMO | Admitting: Podiatry

## 2023-09-25 NOTE — Progress Notes (Deleted)
 Cardiology Office Note:  .   Date:  09/25/2023  ID:  [name], DOB 1951/03/05, MRN [id number] PCP: Irven Coe, MD  [facility] Providers Cardiologist:  None { Click to update primary MD,subspecialty MD or APP then REFRESH:1}  History of Present Illness: .   [name] is a 73 y.o. Caucasian female patient with primary hypertension, diabetes mellitus with stage IIIa chronic kidney disease, hypercholesterolemia, bipolar disorder, morbid obesity with OSA referred to me for evaluation of chest pain and abnormal echocardiogram revealing grade 1 diastolic dysfunction, mild aortic valve stenosis and borderline dilatation of the ascending aorta at 38 mm.  Discussed the use of AI scribe software for clinical note transcription with the patient, who gave verbal consent to proceed.  History of Present Illness            Labs       Latest Ref Rng & Units 12/25/2022    7:19 AM 12/23/2022   11:06 AM 12/22/2022    3:00 AM  BMP  Glucose 70 - 99 mg/dL 99  132  440   BUN 8 - 23 mg/dL 13  14  21    Creatinine 0.44 - 1.00 mg/dL 1.02  7.25  3.66   Sodium 135 - 145 mmol/L 140  140  136   Potassium 3.5 - 5.1 mmol/L 4.0  4.4  4.1   Chloride 98 - 111 mmol/L 106  109  101   CO2 22 - 32 mmol/L 26  25  24    Calcium 8.9 - 10.3 mg/dL 9.9  9.9  44.0       Latest Ref Rng & Units 12/22/2022    3:00 AM 12/17/2022    7:53 PM 04/15/2021    6:31 PM  CBC  WBC 4.0 - 10.5 K/uL 7.4  6.8  5.7   Hemoglobin 12.0 - 15.0 g/dL 34.7  42.5  95.6   Hematocrit 36.0 - 46.0 % 38.8  42.7  41.0   Platelets 150 - 400 K/uL 204  222  221    Lab Results  Component Value Date   TSH 5.39 (H) 10/26/2018    External Labs:  PCP Labs 08/04/2023:  Serum glucose 88 mg, BUN 19, creatinine 1.03, EGFR 58 mL, potassium 4.8.  Hb 13.1/HCT 38.4, platelets 275, normal indicis.  TSH normal at 1.17.  ***ROS  Physical Exam:   VS:  There were no vitals taken for this visit.   Wt Readings from Last 3 Encounters:   12/25/22 246 lb 3.2 oz (111.7 kg)  12/17/22 275 lb (124.7 kg)  07/30/20 275 lb (124.7 kg)     ***Physical Exam  Studies Reviewed: [name]    ECHOCARDIOGRAM COMPLETE 07/26/2023  1. Left ventricular ejection fraction, by estimation, is 60 to 65%. The left ventricle has normal function. The left ventricle has no regional wall motion abnormalities. There is mild concentric left ventricular hypertrophy. Left ventricular diastolic parameters are consistent with Grade I diastolic dysfunction (impaired relaxation). 2. Right ventricular systolic function is normal. The right ventricular size is normal. There is normal pulmonary artery systolic pressure. 3. No evidence of mitral valve regurgitation. 4. There is moderate calcification of the aortic valve. Aortic valve regurgitation is mild. Mild to moderate aortic valve stenosis. 5. There is mild dilatation of the ascending aorta, measuring 38 mm. 6. The inferior vena cava is normal in size with greater than 50% respiratory variability, suggesting right atrial pressure of 3 mmHg.  EKG:  EKG 12/22/2022: Sinus rhythm with first-degree AV block at rate of 98 bpm, right axis deviation, left posterior fascicular block.  Poor R wave progression, cannot exclude anterolateral infarct old.  Low-voltage complexes.  Pulmonary disease pattern.  Medications and allergies    Allergies  Allergen Reactions   Maxzide [Triamterene-Hctz] Other (See Comments)    Hurt patient's kidneys   Celexa [Citalopram Hydrobromide] Nausea Only   Metformin And Related Nausea And Vomiting   Other Nausea And Vomiting and Other (See Comments)    Unnamed medication hurt the patient's kidneys   Reglan [Metoclopramide] Nausea Only   Risperdal [Risperidone] Nausea Only and Other (See Comments)    Also caused hallucinations   Trazodone And Nefazodone Nausea Only   Zestril [Lisinopril] Nausea Only   Lithium Palpitations and Other (See Comments)    Shakes and delusional pt started  seeing things       Current Outpatient Medications:    acetaminophen (TYLENOL) 325 MG tablet, Take 650 mg by mouth every 6 (six) hours as needed for mild pain or headache., Disp: , Rfl:    amLODipine (NORVASC) 5 MG tablet, Take 5 mg by mouth every evening., Disp: , Rfl:    aspirin 81 MG tablet, Take 81 mg by mouth daily., Disp: , Rfl:    famotidine (PEPCID) 20 MG tablet, Take 20 mg by mouth in the morning and at bedtime., Disp: , Rfl:    furosemide (LASIX) 20 MG tablet, Take 20 mg by mouth daily as needed (for severe swelling)., Disp: , Rfl:    lamoTRIgine (LAMICTAL) 150 MG tablet, TAKE 1 TABLET TWICE DAILY, Disp: 180 tablet, Rfl: 0   levocetirizine (XYZAL) 5 MG tablet, Take 5 mg by mouth every evening., Disp: , Rfl:    levothyroxine (SYNTHROID) 100 MCG tablet, Take 100 mcg by mouth daily before breakfast., Disp: , Rfl:    lisinopril (ZESTRIL) 20 MG tablet, Take 1 tablet (20 mg total) by mouth daily., Disp: , Rfl:    metoprolol succinate (TOPROL-XL) 50 MG 24 hr tablet, Take 50 mg by mouth daily., Disp: , Rfl:    mirtazapine (REMERON) 15 MG tablet, Take 1 tablet (15 mg total) by mouth at bedtime., Disp: 90 tablet, Rfl: 0   MOUNJARO 5 MG/0.5ML Pen, Inject 5 mg into the skin once a week., Disp: , Rfl:    Multiple Vitamin (MULTIVITAMIN) tablet, Take 1 tablet by mouth daily., Disp: , Rfl:    polyethylene glycol (MIRALAX / GLYCOLAX) packet, Take 17 g by mouth 2 (two) times daily as needed for mild constipation., Disp: , Rfl:    potassium chloride SA (K-DUR,KLOR-CON) 20 MEQ tablet, Take 20 mEq by mouth daily., Disp: , Rfl:    propranolol (INDERAL) 10 MG tablet, TAKE 1 TO 2 TABLETS BY MOUTH TWICE A DAY AS NEEDED FOR ANXIETY, Disp: 360 tablet, Rfl: 0   QUEtiapine (SEROQUEL XR) 400 MG 24 hr tablet, 1 at 730PM, Disp: 90 tablet, Rfl: 1   QUEtiapine (SEROQUEL) 100 MG tablet, Take 100 mg by mouth at bedtime as needed (Sleep)., Disp: , Rfl:    QUEtiapine (SEROQUEL) 300 MG tablet, TAKE 1 TABLET AT BEDTIME,  Disp: 90 tablet, Rfl: 0   sertraline (ZOLOFT) 50 MG tablet, Take 1 tablet (50 mg total) by mouth daily., Disp: 90 tablet, Rfl: 1   simvastatin (ZOCOR) 40 MG tablet, Take 1 tablet (40 mg total) by mouth every evening., Disp: 30 tablet, Rfl: 0   ASSESSMENT AND PLAN: .      ICD-10-CM  1. Precordial pain  R07.2     2. Primary hypertension  I10     3. Nonrheumatic aortic valve stenosis  I35.0     4. Type 2 diabetes mellitus with stage 3a chronic kidney disease, without long-term current use of insulin (HCC)  E11.22    N18.31       1. Precordial pain ***  2. Primary hypertension ***  3. Nonrheumatic aortic valve stenosis ***  4. Type 2 diabetes mellitus with stage 3a chronic kidney disease, without long-term current use of insulin [facility]) ***  Assessment and Plan                 Signed,  Yates Decamp, MD, [facility] 09/25/2023, 6:03 AM [facility] [address] [place], [place] [address] Phone: [phone]. Fax:  [phone]

## 2023-10-03 ENCOUNTER — Ambulatory Visit: Payer: Medicare HMO | Admitting: Podiatry

## 2023-10-03 ENCOUNTER — Encounter: Payer: Self-pay | Admitting: Podiatry

## 2023-10-03 DIAGNOSIS — B351 Tinea unguium: Secondary | ICD-10-CM | POA: Diagnosis not present

## 2023-10-03 DIAGNOSIS — M79675 Pain in left toe(s): Secondary | ICD-10-CM | POA: Diagnosis not present

## 2023-10-03 DIAGNOSIS — M79674 Pain in right toe(s): Secondary | ICD-10-CM

## 2023-10-03 NOTE — Patient Instructions (Addendum)
You can use ASPERCREME WITH LIDOCAINE, CAPSAICIN CREAM, LIDOCAINE CREAM at night to help the feet.   --  Diabetes Mellitus and Foot Care Diabetes, also called diabetes mellitus, may cause problems with your feet and legs because of poor blood flow (circulation). Poor circulation may make your skin: Become thinner and drier. Break more easily. Heal more slowly. Peel and crack. You may also have nerve damage (neuropathy). This can cause decreased feeling in your legs and feet. This means that you may not notice minor injuries to your feet that could lead to more serious problems. Finding and treating problems early is the best way to prevent future foot problems. How to care for your feet Foot hygiene  Wash your feet daily with warm water and mild soap. Do not use hot water. Then, pat your feet and the areas between your toes until they are fully dry. Do not soak your feet. This can dry your skin. Trim your toenails straight across. Do not dig under them or around the cuticle. File the edges of your nails with an emery board or nail file. Apply a moisturizing lotion or petroleum jelly to the skin on your feet and to dry, brittle toenails. Use lotion that does not contain alcohol and is unscented. Do not apply lotion between your toes. Shoes and socks Wear clean socks or stockings every day. Make sure they are not too tight. Do not wear knee-high stockings. These may decrease blood flow to your legs. Wear shoes that fit well and have enough cushioning. Always look in your shoes before you put them on to be sure there are no objects inside. To break in new shoes, wear them for just a few hours a day. This prevents injuries on your feet. Wounds, scrapes, corns, and calluses  Check your feet daily for blisters, cuts, bruises, sores, and redness. If you cannot see the bottom of your feet, use a mirror or ask someone for help. Do not cut off corns or calluses or try to remove them with medicine. If  you find a minor scrape, cut, or break in the skin on your feet, keep it and the skin around it clean and dry. You may clean these areas with mild soap and water. Do not clean the area with peroxide, alcohol, or iodine. If you have a wound, scrape, corn, or callus on your foot, look at it several times a day to make sure it is healing and not infected. Check for: Redness, swelling, or pain. Fluid or blood. Warmth. Pus or a bad smell. General tips Do not cross your legs. This may decrease blood flow to your feet. Do not use heating pads or hot water bottles on your feet. They may burn your skin. If you have lost feeling in your feet or legs, you may not know this is happening until it is too late. Protect your feet from hot and cold by wearing shoes, such as at the beach or on hot pavement. Schedule a complete foot exam at least once a year or more often if you have foot problems. Report any cuts, sores, or bruises to your health care provider right away. Where to find more information American Diabetes Association: diabetes.org Association of Diabetes Care & Education Specialists: diabeteseducator.org Contact a health care provider if: You have a condition that increases your risk of infection, and you have any cuts, sores, or bruises on your feet. You have an injury that is not healing. You have redness on your legs  or feet. You feel burning or tingling in your legs or feet. You have pain or cramps in your legs and feet. Your legs or feet are numb. Your feet always feel cold. You have pain around any toenails. Get help right away if: You have a wound, scrape, corn, or callus on your foot and: You have signs of infection. You have a fever. You have a red line going up your leg. This information is not intended to replace advice given to you by your health care provider. Make sure you discuss any questions you have with your health care provider. Document Revised: 02/09/2022 Document  Reviewed: 02/09/2022 Elsevier Patient Education  2024 [employer].

## 2023-10-03 NOTE — Progress Notes (Unsigned)
Subjective:   Patient ID: [name], female   DOB: 73 y.o.   MRN: [id number]   HPI Chief Complaint  Patient presents with   [facility]    RM# [id number] DFC   73 year old female presents the office today with above concerns.  She states her nails are thick elongated she cannot do them herself.  No open lesions that she reports.  She was seen for same issue back in 2021.   Review of Systems  All other systems reviewed and are negative.  Past Medical History:  Diagnosis Date   Anxiety    Arthritis    Asthma    Bipolar 1 disorder (HCC)    CHF (congestive heart failure) (HCC)    Complication of anesthesia    left a bad taste in my mouth it has lasted since january    Depression    Diabetes mellitus    Hernia    umbilical   Hyperlipidemia    Hypertension    Hypothyroidism    Pericarditis, viral 2010 October   Sleep apnea    uses cpap setting of 15    Past Surgical History:  Procedure Laterality Date   ABDOMINAL HYSTERECTOMY     APPENDECTOMY     CHOLECYSTECTOMY  1984   open   COLONOSCOPY WITH PROPOFOL N/A 05/20/2014   Procedure: COLONOSCOPY WITH PROPOFOL;  Surgeon: Shirley Friar, MD;  Location: [facility] ENDOSCOPY;  Service: Endoscopy;  Laterality: N/A;   ESOPHAGOGASTRODUODENOSCOPY (EGD) WITH PROPOFOL N/A 05/20/2014   Procedure: ESOPHAGOGASTRODUODENOSCOPY (EGD) WITH PROPOFOL;  Surgeon: Shirley Friar, MD;  Location: [facility];  Service: Endoscopy;  Laterality: N/A;   FOOT SURGERY     bone spurs    KNEE ARTHROSCOPY  09/07/2012   Procedure: ARTHROSCOPY KNEE;  Surgeon: Nadara Mustard, MD;  Location: [facility] OR;  Service: Orthopedics;  Laterality: Left;  Left Knee Arthroscopy     Current Outpatient Medications:    acetaminophen (TYLENOL) 325 MG tablet, Take 650 mg by mouth every 6 (six) hours as needed for mild pain or headache., Disp: , Rfl:    amLODipine (NORVASC) 5 MG tablet, Take 5 mg by mouth every evening., Disp: , Rfl:    aspirin 81 MG tablet, Take 81 mg by mouth daily.,  Disp: , Rfl:    famotidine (PEPCID) 20 MG tablet, Take 20 mg by mouth in the morning and at bedtime., Disp: , Rfl:    furosemide (LASIX) 20 MG tablet, Take 20 mg by mouth daily as needed (for severe swelling)., Disp: , Rfl:    lamoTRIgine (LAMICTAL) 150 MG tablet, TAKE 1 TABLET TWICE DAILY, Disp: 180 tablet, Rfl: 0   levocetirizine (XYZAL) 5 MG tablet, Take 5 mg by mouth every evening., Disp: , Rfl:    levothyroxine (SYNTHROID) 100 MCG tablet, Take 100 mcg by mouth daily before breakfast., Disp: , Rfl:    lisinopril (ZESTRIL) 20 MG tablet, Take 1 tablet (20 mg total) by mouth daily., Disp: , Rfl:    metoprolol succinate (TOPROL-XL) 50 MG 24 hr tablet, Take 50 mg by mouth daily., Disp: , Rfl:    mirtazapine (REMERON) 15 MG tablet, Take 1 tablet (15 mg total) by mouth at bedtime., Disp: 90 tablet, Rfl: 0   MOUNJARO 5 MG/0.5ML Pen, Inject 5 mg into the skin once a week., Disp: , Rfl:    Multiple Vitamin (MULTIVITAMIN) tablet, Take 1 tablet by mouth daily., Disp: , Rfl:    polyethylene glycol (MIRALAX / GLYCOLAX) packet, Take 17 g by mouth  2 (two) times daily as needed for mild constipation., Disp: , Rfl:    potassium chloride SA (K-DUR,KLOR-CON) 20 MEQ tablet, Take 20 mEq by mouth daily., Disp: , Rfl:    propranolol (INDERAL) 10 MG tablet, TAKE 1 TO 2 TABLETS BY MOUTH TWICE A DAY AS NEEDED FOR ANXIETY, Disp: 360 tablet, Rfl: 0   QUEtiapine (SEROQUEL XR) 400 MG 24 hr tablet, 1 at 730PM, Disp: 90 tablet, Rfl: 1   QUEtiapine (SEROQUEL) 100 MG tablet, Take 100 mg by mouth at bedtime as needed (Sleep)., Disp: , Rfl:    QUEtiapine (SEROQUEL) 300 MG tablet, TAKE 1 TABLET AT BEDTIME, Disp: 90 tablet, Rfl: 0   sertraline (ZOLOFT) 50 MG tablet, Take 1 tablet (50 mg total) by mouth daily., Disp: 90 tablet, Rfl: 1   simvastatin (ZOCOR) 40 MG tablet, Take 1 tablet (40 mg total) by mouth every evening., Disp: 30 tablet, Rfl: 0  Allergies  Allergen Reactions   Maxzide [Triamterene-Hctz] Other (See Comments)     Hurt patient's kidneys   Celexa [Citalopram Hydrobromide] Nausea Only   Metformin And Related Nausea And Vomiting   Other Nausea And Vomiting and Other (See Comments)    Unnamed medication hurt the patient's kidneys   Reglan [Metoclopramide] Nausea Only   Risperdal [Risperidone] Nausea Only and Other (See Comments)    Also caused hallucinations   Trazodone And Nefazodone Nausea Only   Zestril [Lisinopril] Nausea Only   Lithium Palpitations and Other (See Comments)    Shakes and delusional pt started seeing things             Objective:  Physical Exam  General: AAO x3, NAD  Dermatological: Nails are hypertrophic, dystrophic, brittle, discolored, elongated 10. No surrounding redness or drainage. Tenderness nails 1-5 bilaterally. No open lesions.   Vascular: Dorsalis Pedis artery and Posterior Tibial artery pedal pulses are 2/4 bilateral with immedate capillary fill time. There is no pain with calf compression, swelling, warmth, erythema.   Neruologic: Sensation decreased with Semmes Weinstein monofilament.  Musculoskeletal:no other areas of discomfort.   Gait: Unassisted, Nonantalgic.       Assessment:   Symptomatic onychomycosis     Plan:  -Treatment options discussed including all alternatives, risks, and complications -Etiology of symptoms were discussed -Sharply debrided nails x 10 to any complications or bleeding -We discussed treatment for neuropathy.  Will start with topical medications such as Aspercreme lidocaine, capsaicin cream. -Daily foot inspection  Return in about 3 months (around 12/31/2023).  Vivi Barrack DPM

## 2023-10-20 DIAGNOSIS — H43813 Vitreous degeneration, bilateral: Secondary | ICD-10-CM | POA: Diagnosis not present

## 2023-10-20 DIAGNOSIS — H35033 Hypertensive retinopathy, bilateral: Secondary | ICD-10-CM | POA: Diagnosis not present

## 2023-10-20 DIAGNOSIS — H35372 Puckering of macula, left eye: Secondary | ICD-10-CM | POA: Diagnosis not present

## 2023-10-23 ENCOUNTER — Ambulatory Visit: Payer: Medicare HMO | Admitting: Podiatry

## 2023-10-25 ENCOUNTER — Other Ambulatory Visit: Payer: Self-pay | Admitting: Psychiatry

## 2023-10-25 DIAGNOSIS — F3164 Bipolar disorder, current episode mixed, severe, with psychotic features: Secondary | ICD-10-CM

## 2023-10-29 ENCOUNTER — Other Ambulatory Visit: Payer: Self-pay | Admitting: Psychiatry

## 2023-10-29 DIAGNOSIS — F411 Generalized anxiety disorder: Secondary | ICD-10-CM

## 2023-10-29 DIAGNOSIS — F4001 Agoraphobia with panic disorder: Secondary | ICD-10-CM

## 2023-10-30 DIAGNOSIS — E7800 Pure hypercholesterolemia, unspecified: Secondary | ICD-10-CM | POA: Diagnosis not present

## 2023-10-30 DIAGNOSIS — I10 Essential (primary) hypertension: Secondary | ICD-10-CM | POA: Diagnosis not present

## 2023-10-30 DIAGNOSIS — E1129 Type 2 diabetes mellitus with other diabetic kidney complication: Secondary | ICD-10-CM | POA: Diagnosis not present

## 2023-10-30 DIAGNOSIS — E039 Hypothyroidism, unspecified: Secondary | ICD-10-CM | POA: Diagnosis not present

## 2023-10-31 DIAGNOSIS — R350 Frequency of micturition: Secondary | ICD-10-CM | POA: Diagnosis not present

## 2023-10-31 DIAGNOSIS — N3021 Other chronic cystitis with hematuria: Secondary | ICD-10-CM | POA: Diagnosis not present

## 2023-11-02 DIAGNOSIS — E7800 Pure hypercholesterolemia, unspecified: Secondary | ICD-10-CM | POA: Diagnosis not present

## 2023-11-02 DIAGNOSIS — E039 Hypothyroidism, unspecified: Secondary | ICD-10-CM | POA: Diagnosis not present

## 2023-11-02 DIAGNOSIS — I129 Hypertensive chronic kidney disease with stage 1 through stage 4 chronic kidney disease, or unspecified chronic kidney disease: Secondary | ICD-10-CM | POA: Diagnosis not present

## 2023-11-02 DIAGNOSIS — I10 Essential (primary) hypertension: Secondary | ICD-10-CM | POA: Diagnosis not present

## 2023-11-02 DIAGNOSIS — R413 Other amnesia: Secondary | ICD-10-CM | POA: Diagnosis not present

## 2023-11-02 DIAGNOSIS — F319 Bipolar disorder, unspecified: Secondary | ICD-10-CM | POA: Diagnosis not present

## 2023-11-02 DIAGNOSIS — E1129 Type 2 diabetes mellitus with other diabetic kidney complication: Secondary | ICD-10-CM | POA: Diagnosis not present

## 2023-11-02 DIAGNOSIS — J45909 Unspecified asthma, uncomplicated: Secondary | ICD-10-CM | POA: Diagnosis not present

## 2023-11-15 ENCOUNTER — Encounter: Payer: Self-pay | Admitting: Psychiatry

## 2023-11-15 ENCOUNTER — Ambulatory Visit: Payer: Medicare HMO | Admitting: Psychiatry

## 2023-11-15 DIAGNOSIS — F419 Anxiety disorder, unspecified: Secondary | ICD-10-CM | POA: Diagnosis not present

## 2023-11-15 DIAGNOSIS — F3164 Bipolar disorder, current episode mixed, severe, with psychotic features: Secondary | ICD-10-CM | POA: Diagnosis not present

## 2023-11-15 DIAGNOSIS — F5105 Insomnia due to other mental disorder: Secondary | ICD-10-CM

## 2023-11-15 DIAGNOSIS — F4001 Agoraphobia with panic disorder: Secondary | ICD-10-CM

## 2023-11-15 DIAGNOSIS — R7989 Other specified abnormal findings of blood chemistry: Secondary | ICD-10-CM | POA: Diagnosis not present

## 2023-11-15 DIAGNOSIS — G3184 Mild cognitive impairment, so stated: Secondary | ICD-10-CM | POA: Diagnosis not present

## 2023-11-15 DIAGNOSIS — F40240 Claustrophobia: Secondary | ICD-10-CM

## 2023-11-15 DIAGNOSIS — F411 Generalized anxiety disorder: Secondary | ICD-10-CM

## 2023-11-15 NOTE — Progress Notes (Signed)
 [name] [id number] 05/03/1951 73 y.o.   Video Visit via My Chart  I connected with pt by video using My Chart and verified that I am speaking with the correct person using two identifiers.   I discussed the limitations, risks, security and privacy concerns of performing an evaluation and management service by My Chart  and the availability of in person appointments. I also discussed with the patient that there may be a patient responsible charge related to this service. The patient expressed understanding and agreed to proceed.  I discussed the assessment and treatment plan with the patient. The patient was provided an opportunity to ask questions and all were answered. The patient agreed with the plan and demonstrated an understanding of the instructions.   The patient was advised to call back or seek an in-person evaluation if the symptoms worsen or if the condition fails to improve as anticipated.  I provided 30 minutes of video time during this encounter.  The patient was located at home and the provider was located office. Session 400-430 pm  Subjective:   Patient ID:  [name] is a 73 y.o. (DOB 12-Feb-1951) female.  Chief Complaint:  No chief complaint on file.   [name] presents to the office today for follow-up of anxiety and depression and poor cognition.  At her visit October 12, 2018.  Because of her poor cognition which did seem to get even worse lately with her memory and given a negative unremarkable medical work-up by her primary care doctor the decision was made to change her from Xanax which she has been on for years to lorazepam.  Lorazepam often has less cognitive side effects than does alprazolam.  She had a lot of difficulty with headaches and insomnia with the transition.  She called several times in that transition.  Her husband reported that her cognition was better after the transition however.  She was satisfied with the Ativan.  There was  an attempt to reduce her lamotrigine to 150 mg also in hopes of improving cognition but it was uncertain at the last visit where there she had actually done so.  visit December 2020 without med changes.  She had continued to do cognitively better off alprazolam and on lorazepam.  seen March 2021.   Better than December visit.  Feeling good and less down.  Adjusting time of med helped excessively sleep.  Brief hypomanic episode resolved where she cleaned all night.  Sleep 10 hours . No med changes.  12/23/19 appt.  Noted: More depression, crying and racing thoughts and distressed to the point of wondering if needed the hospital.  Sleep got worse and needed Seroquel IR 200 also bc went 2 days without sleep.  It worked and helped sleep.  Worry a lot and melancholy this week.  Cries over what happens dealing with her daugher.[name] has prostate and kidney cancer again.  Had surgery June 10, 2019 and she's cried a lot.  He's incontinent and very uncomfortable.  He usually has positive attitude.   Worry over his health and how she'll do if something happens to him.  She's afraid of Covid.  Trusting in God usually.  She realizes she  Needs to drive occasionally bc of his health.  [name] had prostate CA and lived to 65 yo.  Uncle died of it.  Not markedly depressed. Can't walk much dT plantar fascitis. Also stressed over [name] 71 yo not doing school work and causing problems.  Her father [name] is a bad parent and not helpful.  Stressed over [employer] and downs too and they have periodic conflict.   More down and anxious.   Guilt tendency.   Patient reports difficulty with sleep initiation or maintenance in part DT anxiety and racing thoughts.Intermittent sleep problems with days and nights mixed up.  Not napping. Not manic.[name] Denies appetite disturbance.  Patient reports that energy and motivation have been good.  Patient has some difficulty with concentration.  Chronic memory problems.   Patient  denies any suicidal ideation. Plan:   No changes in meds indicated except agree to take the extra Seroquel 200 IR HS for mixed manic sx that are worse right now.  May be having mixed seasonal sx.  02/20/2020 appointment with the following noted: Doing fairly good except chronic worry over [name].  Some days cries a lot over it.  D having a hard time lately.  Still easily stressed but not more than usual. Stopped extra quetiapine 200 mg HS bc no longer needed it. Anxiety is chronic but at baseline as is depression and not manic now. H helps her take the meds.   Memory is still bad but has been worse when on Xanax instead fo Ativan.  Sleep is fine from 10 to 9 which is much better than in the past.  Occ spells of staying up for 2 days and then needs to take quetiapine.   [name] goes for FU soon for cancer check up. Plan: no med changes  05/21/20 appt with following noted: "A lot of problems".  Can't drive bc fear and anxiety.  Needed to drive H and couldn't. Fall off toilet twice. Dizzy and HA 2 days ago with a lot of crying and stress with daughter. Leaving home even coming here causes anxiety.    Cataract surgery twice. Fear of Bill being diagnosed with recurrent cancer. Chronic anxiety greather than depression.   Don't know what to do with daughter. Sleep, appetite and energy are OK. Tolerating meds. Sees [name] one day weekly. H helps with meds bc she's forgetful and easily confused.  Loses train of thoughts. Dog has separation anxiety and so they don't go out much.  Plan: no med changes  08/18/20 appt with following noted: 2 ER visitis with vertigo.  RX diazepam and meclizine. Rarely takes former but takes latter regularly. Shakes so bad it made her cry and scream.  Started PT. Continues lorazepam low dose 0.5 mg AM and 1.0 mg HS.  Still has dizziness and vertigo. Memory is still bad and H administers meds with pill box. Chronic anxiety.  Depression manageable. Sleep OK and  tolerates meds otherwise. Angelica Chessman driving her crazy. Plan: no med changes  11/17/20 appt with following noted: Has had periods of vertigo and meds for it.  Then had manic sx with cleaning and couldn't sleep for 24 hours. Stopped quetiapine 200 and continued quetiapine XR 800. Also started diazepam 5 mg BID for vertigo and continued lorazepam for anxiety.  No meclizine now.  Vertigo stopped and manic sx stopped.  Tolerating meds now. Still feels Angelica Chessman is mean to her too often and she will cry over it. Mandy's ExH [name] in jail and is the father of Mandy's D [name] 21 yo.  Will be there for 3 mos. Not markedly depressed.  But hates herself some days.  Still memory problems about the same.  Sleeping OK now.  Periods of mood swings. Easily anxious with relatives  visiting.   Plan: Mixed manic sx resolved for now. No med changes today.  02/17/2021 appointment with the following noted: Not good.  "A whole lof of everything."  Shaking for mos. It comes and goes.  Shaking at times makes her think she'll have a nervous breakdown.   Went to ER and dx vertigo.  Has tried meclizine and Valium but scared to take it.  Big shakes again today.  Memory is not good. Sleep to escape anxiety.  Scared to leave home. Not sure what brings on the spells of shaking. Today sx hit her as soon as she awoke.  Plan: No med changes  03/12/2021 phone call: Patient with a lot of anxiety.  Husband reported the patient had been hallucinating and was paranoid that he had left her.  Some confusion over identity of people that were around her.  It was suggested that she have medical work-up because it sounded like she had delirium and may have a UTI causing that. 03/17/2021 psychiatric hospitalization. 03/22/2021 MD response:  Note Patient has been very unstable with bipolar disorder mixed symptoms lately also with some delirium of unknown cause.  She will be seeing her primary care physician tomorrow.  Due to the severity of her symptoms  it is suggested that she add haloperidol 2 mg nightly to her current quetiapine 800 mg nightly since we cannot increase the quetiapine dose further.  This may help with her confusion and agitation.  It is okay to talk with her husband because he administers her medication and because she is too confused to understand the instructions.     04/16/21 H called with following:  Pt.'s husband called.  She is currently in [facility] waiting for an inpatient pyschiatric bed.  Pt advised her husband that Dr Jennelle Human told her to stop taking the Lorazepam.  He said he is not aware of that chang in her prescription.  He would like someone to call him and advise if there were any changes to her meds so he stays aware and can let the hospital know if needed. Husband was informed we had not made any medication changes and would not have suggested she abruptly stopped lorazepam which could cause withdrawal and other kinds of problems.  05/11/2021 appointment with the following noted:  spoke with H for info At hospital was taken off Ativan, buspirone, and Seroquel reduced to 500 mg HS (as 1 of XR 400 mg  and 1/2 of 200 mg IR quetiapine).  Had sedative SE at 200 mg queiapinte !R.  Off risperidone and haloperidol.   Hospitalized at [facility] for 9 days.  They didn't feel communication was good with them. But she is better now than she was.  Had been psychotic PTH.  Last 2-3 days getting more hyper and can't wind down and hard time going to sleep.  Came home 05/03/21. She didn't like the restrictions at the hospital.  No panic lately and wants something to help her sleep. Plan; Mixed manic sx including psychosis recently but not psychotic now but insomnia.. Will get more psychotic if she doesn't sleep but apparently didn't tolerate Seroquel IR 200 mg HS well so will increase to quetiapine IR 150 mg HS and XR 400 mg nightly. Continue lamotrigine.    06/11/21 appt:  [facility]. H thinks she's doing goood. Doing  very fine.  No trouble sleeping.  Sometimes in morning feels sad bc she knows she will have racing thoughts, but it resolves after a  little while.  Racing thoughts since hospital.  Worries over money chronically. To bed 1015-7:30.  Maybe longer which is normal. H administers meds.  Some wordfinding problems.  Does journal which helps. No SE with meds.  Yesterday was hard with anxiety but today is better.  On lamotrigine 150 BID, SERoquel XR 400 + Seroquel IR 150 mg HS. Plan: Much better but not fully resolved.  Mixed manic sx including psychosis recently but not psychotic but insomnia last visit resolved with the following... apparently didn't tolerate Seroquel IR 200 mg HS well so increased quetiapine IR 150 mg HS and XR 400 mg nightly and now she is sleeping but still having some racing thoughts. Continue lamotrigine 150 BID.    07/14/2021 appointment with the following noted: Bad day yesterday feeling very depressed without anxiety. Not sleeping well lately.  Thinks it's related to less Seroquel than in the past.  Some EMA.  Always normal slept a lot. Compulsive cleaning and everything in it's place.  Arranges soap dispensers. Cleaning in the middle of the night. No recent psychotic sx but still racing thoughts and chronic $ worries. Still afraid to leave home.  09/20/2021 appt noted: Great overall.  Occ insomnia with EMA.  Still worry but tries not let it consume me.  Still agoraphobia and hates to go out to dentists and doctors.  Challenge to go to dentist.  Can still dread things. Brief nap in daytimes. No SE H administers meds. Seroquel 300 mg HS, XR 400 mg HS.  Lamotrigine 150 Patient reports stable mood and denies depressed or irritable moods.   Patient denies difficulty with sleep initiation or maintenance. Denies appetite disturbance.  Patient reports that energy and motivation have been good.  Patient denies any difficulty with concentration.  Patient denies any suicidal  ideation.  01/20/22 appt noted: also talked with [name] Doesn't like waiting 4 mos between appts. Was manic before increasing Seroquel with irritability, not sleeping a couple of days and excessive cleaning. Seroquel increased to IR 400 mg HS.  No SE I feel much Better now.  Doing better at time with anxiety.   Chronic worry over D and GD. GD with psych problems. Sleeping good again. Racing thoughts are better but not gone Tolerating meds.   No further changes desired.   Plan: Seroquel IR 400 mg HS well and XR 400 mg nightly abc recent manic sx better with increase dose. Continue lamotrigine 150 mg twice daily  04/28/2022 appointment noted: Trouble sleeping without extra 100 mg quetiapine HS and takes XR 400 mg and IR 300 mg at 730 pm.  Otherwise will be up until 3 AM.  Sleep from 11 until 830.  Good unless anxious. Doing fairly well except 2 weeks bad spell from July 31 related to Bill receiving call from woman he worked with 8 years ago and triggered fear ans suspiciousness and insecurity and overwhelmed her.  She cried and over reacted.  Rough 2 weeks but otherwise ok. Real worried about [name] who is self cutting.  Seeing therapist and psychiatrist.  Her father is terrible and inattentive.  She is 71 yo and never happy. I still have agoraphobia and doesn't want to go ut places.  Her dog also has severe anxiety and doesn't like to be alone too.  Still fearul about money.  08/03/22 appt noted: Doing pretty well overall.  Chronic worry over money.  Met old friend on Facebook.  She would contact Marylu Lund a lot.  Was making her sad.  Would  cry every time the woman called.  ,This was wearing her out bc woman was hyperverbal.  Cut the woman off.  Since stopped talking to her then no longer crying. When starts to get dark then racing thoughts.  Driving me crazy.  Chronic worry .  Chronic memorhy problems and tendency to be obsessed over fear of dementia. Irritable and confused.  Might nap 30 min and rest her  mind and awakens feeling better. Continues the same meds: lamotrigine and quetiapine. Gained 20# last year. Plan: Have pushed Seroquel  as far as possible so start another mood stabilizer Trileptal and increase to 10 mg AM and 300 mg pM  08/09/2022 phone call: She took oxcarbazepine 150 mg Friday and Saturday and developed severe abdominal pain, bowel and bladder incontinence, and impaired vision.  She skipped it on Sunday and symptoms resolved.  She took it again yesterday and the side effects returned.  States it did help with the racing thoughts but she is unable to tolerate it.  That was just on half of oxcarbazepine tablet. MD response: DC oxcarbazepine due to side effects.  10/17/22 appt noted: Devoria Glassing today. Still racing thoughts and cleaning a lot.  More anxiety than depression.  Rough road.  Worries about everything.   Thoughts jump from one thing to another.  Can't get away from it.  Can't focus.  Hard to take instruction bc of this.    12/20/22 appt noted: Not good.  Kidney stone. More anxiety last couple of mos. Get something on her mind and she cannot get it off.  Obsesses on things that don't matter.  Trouble going to sleep.  Couldn't go into store the other day dT panic and agoraphobia.   Occ dep but nothing to worry about.  More panic.   Lost 15 # on Monjaro.   Plan start sertraline for anxiety  03/03/23 appt noted: TC Been hosp for kidney px kidney stone.  Glad to be over it.  .   Lost 42 # since April 1 with Monjauro.  Eating better.   Anxiety is better with sertraline 50 mg marked benefit with very little anxiety.  Much happier and less crying.  Better acceptance and self talk better than ever before.   Still some dep which interferes with function at times but not as severe. I'm doing ok.   Tolerating psych meds without problems.  05/03/23 appt noted:  Psych med: lamotrigine 150 BID, Quetiapine ER 400 mg pm and IR 300-400 mg HS, sertraline 50 for anxiety. Real  sleepy today after taking quetiapine 200 mg , more than usual.  Only takes it prn.  Usually getting enough sleep.  But sometimes keyed up and can't go to sleep.   Been feeling good overall.  Occ bad days.  For the most part doing fine.   Does "thrifting" which lifts her spirits about 1 time weekly.   It takes her mind off worries.   No marked dep and anxiety is manageable. Lost 55#.   Doing fine with Bill.   [name] 71 yo.  Quiet girl. No med change desired.  07/05/23 appt noted:  In person  Had episode of anxiety with severe worry and obsessing over money at times and bad  episode a couple of weeks ago. Lost 63# on Monjaro.  SE hair thinning.  Brittle nails. Can report many potential SE.  Better mobility now.  "Very nice emotionally".   Now 212# and goal is 199#.   Blood sugar runs in  70's fasting.   No insulin.  Exercising too.   No sig dep.  I don't have dep like I did years ago but have bad anxiety.  Still can't drive with anxiety and memory px.   Sleep at night is better.  STM is "so bad".   H prepares meds.  Forgets how to do tasks at times.   Terrible anxiety.   Psych med: lamotrigine 150 BID, Quetiapine ER 400 mg pm and IR 300-400 mg HS, sertraline 50 for anxiety. Uses routine to help with memory.   Plan: Trial low dose propranolol 10-20 mg BID prn anxiety bc it helped D's anxiety.    09/06/23 apppt noted: Psych med: lamotrigine 150 BID, Quetiapine ER 400 mg pm and IR 300-400 mg HS, sertraline 50 for anxiety. Tried propranolol for anxiety up to 20 BID.  It does help some.  Will get stubborn and not take it.   Had a lot anxiety and trouble sleeping.  When bad I clean like the devil.  Only 1 hour sleep last night.    Hard to learn new things since childhood like technology.  Can't use tablet without his help.   Last night took extra 100 mg Seroquel and didn't help. No trigger for more anxiety, tension, scared.  Even real nervous before MD called.   Lost 62#  on Mounjaro.  Feels better  physically.   Some people tell her she has dementia.    Worried Seroquel might cause that.  No clear pcpt bc long term memory problems.  Can't deal with thinking I might have dementia.   Chronic anxiety since childhood.   Has a friend who calls and a lot and chronically is negative and complains all the time.  Sleep worse for 2-3 weeks.   Plan: DC sertraline and start mirtazapine 15 mg HS for sleep and anxiety  11/15/23 appt noted:  [name] on phone call Psych med: lamotrigine 150 BID, Quetiapine ER 400 mg pm and IR 300-400 mg HS, mirtazapine 15 HS Tried propranolol for anxiety up to 20 BID.  Having dep and anxiety bad.  Sleeping is terrrible.  Went up to 3 days without sleep.  About 2 weeks. No trigger.  Cannot go to sTrying to adapt to it and tolerate it.   Trouble focusing and memory issues worse.  Gets easily overwhelmed and stimulated.  Will get lost in stores and large areas. H notices.  Episodic cleaning episodes over the top.   Loses train of thought   Prior psychiatric medication trials include  paroxetine, Lexapro, sertraline 50 Sertraline 50 daily.   risperidone 4 mg which was sedating, aripiprazole,  perphenazine,  Seroquel 1000,  olanzapine for anxiety, lithium which was not tolerated even at very low dosages.  Topamax for anxiety, Oxcarbazepine 150 mg bowel and bladder incontinence and impaired vision. She was intolerant of both Namenda and Aricept.   Lamotrigine 300.   Xanax, Ativan, diazepam 5 mg BID   This is not an exhaustive list.   Psych hospitalization 04/2021 at [facility] for psychosis.  At the visit in February 2020 Lakeyta was more acutely confused recently for no clear reason.   She had a negative medical work-up for causes of short-term memory problems and confusion.  Therefore the decision was made to try switching her from alprazolam to lorazepam at the lowest possible dose in hopes of improving her cognition.  This was successful and her cognition was  improved significantly and her husband verified this.  [name] seeing Dr. Tonny Bollman D noted sig anxiety benefit with propranolol  Neuropsych testing mid 2019 at [facility] but results not on chart.    Review of Systems:  Review of Systems  Eyes:  Positive for visual disturbance.  Cardiovascular:  Negative for chest pain and palpitations.  Musculoskeletal:  Positive for arthralgias, back pain and gait problem.  Neurological:  Positive for dizziness. Negative for weakness.  Psychiatric/Behavioral:  Positive for decreased concentration, dysphoric mood and sleep disturbance. Negative for agitation, behavioral problems, confusion, hallucinations, self-injury and suicidal ideas. The patient is nervous/anxious. The patient is not hyperactive.     Medications: I have reviewed the patient's current medications.  Current Outpatient Medications  Medication Sig Dispense Refill   acetaminophen (TYLENOL) 325 MG tablet Take 650 mg by mouth every 6 (six) hours as needed for mild pain or headache.     amLODipine (NORVASC) 5 MG tablet Take 5 mg by mouth every evening.     aspirin 81 MG tablet Take 81 mg by mouth daily.     famotidine (PEPCID) 20 MG tablet Take 20 mg by mouth in the morning and at bedtime.     furosemide (LASIX) 20 MG tablet Take 20 mg by mouth daily as needed (for severe swelling).     lamoTRIgine (LAMICTAL) 150 MG tablet TAKE 1 TABLET TWICE DAILY 180 tablet 0   levocetirizine (XYZAL) 5 MG tablet Take 5 mg by mouth every evening.     levothyroxine (SYNTHROID) 100 MCG tablet Take 100 mcg by mouth daily before breakfast.     lisinopril (ZESTRIL) 20 MG tablet Take 1 tablet (20 mg total) by mouth daily.     metoprolol succinate (TOPROL-XL) 50 MG 24 hr tablet Take 50 mg by mouth daily.     mirtazapine (REMERON) 15 MG tablet Take 1 tablet (15 mg total) by mouth at bedtime. 90 tablet 0   MOUNJARO 5 MG/0.5ML Pen Inject 5 mg into the skin once a week.     Multiple Vitamin (MULTIVITAMIN) tablet Take 1  tablet by mouth daily.     polyethylene glycol (MIRALAX / GLYCOLAX) packet Take 17 g by mouth 2 (two) times daily as needed for mild constipation.     potassium chloride SA (K-DUR,KLOR-CON) 20 MEQ tablet Take 20 mEq by mouth daily.     propranolol (INDERAL) 10 MG tablet TAKE 1 TO 2 TABLETS BY MOUTH TWICE A DAY AS NEEDED FOR ANXIETY 120 tablet 0   QUEtiapine (SEROQUEL XR) 400 MG 24 hr tablet TAKE 1 TABLET AT 7:30PM 90 tablet 0   QUEtiapine (SEROQUEL) 100 MG tablet Take 100 mg by mouth at bedtime as needed (Sleep).     QUEtiapine (SEROQUEL) 300 MG tablet TAKE 1 TABLET AT BEDTIME 90 tablet 0   sertraline (ZOLOFT) 50 MG tablet Take 1 tablet (50 mg total) by mouth daily. 90 tablet 1   simvastatin (ZOCOR) 40 MG tablet Take 1 tablet (40 mg total) by mouth every evening. 30 tablet 0   No current facility-administered medications for this visit.    Medication Side Effects: None  Allergies:  Allergies  Allergen Reactions   Maxzide [Triamterene-Hctz] Other (See Comments)    Hurt patient's kidneys   Celexa [Citalopram Hydrobromide] Nausea Only   Metformin And Related Nausea And Vomiting   Other Nausea And Vomiting and Other (See Comments)    Unnamed medication hurt the patient's kidneys   Reglan [Metoclopramide] Nausea Only   Risperdal [Risperidone] Nausea Only and Other (See Comments)    Also  caused hallucinations   Trazodone And Nefazodone Nausea Only   Zestril [Lisinopril] Nausea Only   Lithium Palpitations and Other (See Comments)    Shakes and delusional pt started seeing things      Past Medical History:  Diagnosis Date   Anxiety    Arthritis    Asthma    Bipolar 1 disorder (HCC)    CHF (congestive heart failure) (HCC)    Complication of anesthesia    left a bad taste in my mouth it has lasted since january    Depression    Diabetes mellitus    Hernia    umbilical   Hyperlipidemia    Hypertension    Hypothyroidism    Pericarditis, viral 2010 October   Sleep apnea    uses  cpap setting of 15    Family History  Problem Relation Age of Onset   Diabetes Mother    Hypertension Mother    Hyperlipidemia Mother    Diabetes Father    Hypertension Father    Hyperlipidemia Father    Hypertension Sister     Social History   Socioeconomic History   Marital status: Married    Spouse name: Not on file   Number of children: Not on file   Years of education: Not on file   Highest education level: Not on file  Occupational History   Not on file  Tobacco Use   Smoking status: Never   Smokeless tobacco: Never  Substance and Sexual Activity   Alcohol use: No   Drug use: No   Sexual activity: Not on file  Other Topics Concern   Not on file  Social History Narrative   Not on file   Social Drivers of Health   Financial Resource Strain: Not on file  Food Insecurity: No Food Insecurity (12/22/2022)   Hunger Vital Sign    Worried About Running Out of Food in the Last Year: Never true    Ran Out of Food in the Last Year: Never true  Transportation Needs: No Transportation Needs (12/22/2022)   PRAPARE - Administrator, Civil Service (Medical): No    Lack of Transportation (Non-Medical): No  Physical Activity: Not on file  Stress: Not on file  Social Connections: Not on file  Intimate Partner Violence: Not At Risk (12/22/2022)   Humiliation, Afraid, Rape, and Kick questionnaire    Fear of Current or Ex-Partner: No    Emotionally Abused: No    Physically Abused: No    Sexually Abused: No    Past Medical History, Surgical history, Social history, and Family history were reviewed and updated as appropriate.   Please see review of systems for further details on the patient's review from today.   Objective:   Physical Exam:  There were no vitals taken for this visit.  Physical Exam Neurological:     Mental Status: She is alert and oriented to person, place, and time.     Cranial Nerves: No dysarthria.  Psychiatric:        Attention and  Perception: Perception normal. She is inattentive.        Mood and Affect: Mood is anxious and depressed. Affect is not labile or tearful.        Speech: Speech is not rapid and pressured or slurred.        Behavior: Behavior is cooperative.        Thought Content: Thought content normal. Thought content is not paranoid or delusional. Thought content  does not include homicidal or suicidal ideation. Thought content does not include suicidal plan.        Cognition and Memory: Cognition normal. She exhibits impaired recent memory.        Judgment: Judgment normal.     Comments: Insight intact Some word-finding issues longterm and seems worse.  Rambles with poor focus at times but directable.   Anxiety worse and sleep worse. Talkative.       Lab Review:     Component Value Date/Time   NA 140 12/25/2022 0719   K 4.0 12/25/2022 0719   CL 106 12/25/2022 0719   CO2 26 12/25/2022 0719   GLUCOSE 99 12/25/2022 0719   GLUCOSE 154 (H) 09/01/2006 1100   BUN 13 12/25/2022 0719   CREATININE 0.99 12/25/2022 0719   CREATININE 0.93 07/19/2011 1110   CALCIUM 9.9 12/25/2022 0719   PROT 6.8 12/22/2022 0300   ALBUMIN 3.6 12/22/2022 0300   AST 19 12/22/2022 0300   ALT 15 12/22/2022 0300   ALKPHOS 76 12/22/2022 0300   BILITOT 0.7 12/22/2022 0300   GFRNONAA >60 12/25/2022 0719   GFRAA >60 03/23/2016 1933       Component Value Date/Time   WBC 7.4 12/22/2022 0300   RBC 4.17 12/22/2022 0300   HGB 13.1 12/22/2022 0300   HCT 38.8 12/22/2022 0300   PLT 204 12/22/2022 0300   MCV 93.0 12/22/2022 0300   MCH 31.4 12/22/2022 0300   MCHC 33.8 12/22/2022 0300   RDW 13.6 12/22/2022 0300   LYMPHSABS 1.2 04/15/2021 1831   MONOABS 0.4 04/15/2021 1831   EOSABS 0.1 04/15/2021 1831   BASOSABS 0.1 04/15/2021 1831   Prior lamotrigine level in 2018 on this dosage was 3.6.  Not particularly high.  Per Dr. Delaine Lame: Clinical Impressions: Cognitive complaints are likely due to underlying psychiatric disorder  (bipolar disorder/anxiety disorder with racing thoughts). Diagnostic impressions based on test performances are limited due to the patient's severe inability to fully attend to the tasks. However, based on clinical presentation, I highly doubt the presence of a neurodegenerative dementia. It is much more likely that her racing thoughts and psychiatric disorders are interfering with cognitive   No results found for: "POCLITH", "LITHIUM"   No results found for: "PHENYTOIN", "PHENOBARB", "VALPROATE", "CBMZ"   .res Assessment: Plan:    No diagnosis found.   [name] has chronic severe anxiety with panic attacks as well as bipolar disorder with a history of significant lability.  She has had more severe instability with psychotic sx and cognitive problems off and on lately.   More mixed sx with insomnia and dep and anxiety. Psych hospitalization 04/2021 at [facility] for psychosis. She also has mild cognitive impairment as a complicating factor.  She needs her husband's assistance to manage her medications.    She has panic attacks and agoraphobia, not wanting to leave the house.  She could not get out of the car to go into a store with her husband earlier in 2024, because of intense fear.  But it is better.  More anxious with insomnia lately.    DC mirtazapine 15 mg HS fDT NR  Continue Seroquel XR 400 mg nightly but add IR 500 mg HS.  This is above usual max but she has tolerated in past and prefers this to change.  Continue lamotrigine 150 BID.    Consider switch to Caplyta or Saphris bc lower SE and may help anxiety but probably would not be covered by insurance.  Discussed potential metabolic  side effects associated with atypical antipsychotics, as well as potential risk for movement side effects. Advised pt to contact office if movement side effects occur.  Disc family's fear of LT SE.  However current meds work and without them sx unmanageable.  Has been able to stop BZ    Continue MVI.    Pt is chronically needy and requires extended appts DT chronic anxiety and easily stressed but much better with sertraline low dose for anxiety. Cannot drive now and wasn't much before.    No evidence of medical cause for STM px.  FHX dementia including mother.   Pt doesn't feel able to fix her meds by herself.  Gives up quickly on task demands.    Stop  mirtazapine DT NR Increase Seroquel XR 400 mg and add additional IR Seroquel 500 mg HS Call in a week to see if things are better  FU 1-2 mos  Meredith Staggers, MD, DFAPA   Future Appointments  Date Time Provider Department Center  11/28/2023  3:40 PM Yates Decamp, MD [facility]  12/25/2023  3:45 PM Vivi Barrack, DPM [facility]  03/28/2024  1:30 PM [facility] DX DEXA 1 [facility]       No orders of the defined types were placed in this encounter.      -------------------------------

## 2023-11-21 ENCOUNTER — Telehealth: Payer: Self-pay | Admitting: Psychiatry

## 2023-11-21 NOTE — Telephone Encounter (Signed)
 Per Dr. Alwyn Ren instruction, they have increased [name]'s Seroquel to 300mg  and 200mg  at bedtime ( totaling 500mg ) . She will need a new RX for either  200mg  or 500mg  so she can continue at that dose.( She has plenty of 300mg ) Please send in a 90 day RX to [facility].

## 2023-11-21 NOTE — Telephone Encounter (Signed)
 Continue Seroquel XR 400 mg nightly but add IR 500 mg HS.

## 2023-11-22 MED ORDER — QUETIAPINE FUMARATE 200 MG PO TABS: 200.0000 mg | ORAL_TABLET | Freq: Every day | ORAL | 0 refills | Status: DC

## 2023-11-22 NOTE — Telephone Encounter (Signed)
 Sent Rx for 200 mg Seroquel to [facility].

## 2023-11-23 ENCOUNTER — Telehealth: Payer: Self-pay | Admitting: Psychiatry

## 2023-11-23 NOTE — Telephone Encounter (Signed)
 [name] at [facility] LVM @ 8:47a.  She said they need a new script for Mirtazapine and Propranolol sent to them.  Next appt 5/2

## 2023-11-23 NOTE — Telephone Encounter (Signed)
 Mirtazapine was discontinue due to no response. Propranolol was sent to [facility] on 3/5.

## 2023-11-25 ENCOUNTER — Other Ambulatory Visit: Payer: Self-pay | Admitting: Psychiatry

## 2023-11-25 DIAGNOSIS — F3164 Bipolar disorder, current episode mixed, severe, with psychotic features: Secondary | ICD-10-CM

## 2023-11-28 ENCOUNTER — Encounter: Payer: Self-pay | Admitting: Cardiology

## 2023-11-28 ENCOUNTER — Ambulatory Visit: Payer: Medicare HMO | Attending: Cardiology | Admitting: Cardiology

## 2023-11-28 ENCOUNTER — Encounter: Payer: Self-pay | Admitting: *Deleted

## 2023-11-28 VITALS — BP 142/88 | HR 75 | Resp 14 | Ht 65.0 in | Wt 217.6 lb

## 2023-11-28 DIAGNOSIS — E7800 Pure hypercholesterolemia, unspecified: Secondary | ICD-10-CM

## 2023-11-28 DIAGNOSIS — R072 Precordial pain: Secondary | ICD-10-CM | POA: Diagnosis not present

## 2023-11-28 DIAGNOSIS — I10 Essential (primary) hypertension: Secondary | ICD-10-CM | POA: Diagnosis not present

## 2023-11-28 DIAGNOSIS — R9431 Abnormal electrocardiogram [ECG] [EKG]: Secondary | ICD-10-CM

## 2023-11-28 DIAGNOSIS — I350 Nonrheumatic aortic (valve) stenosis: Secondary | ICD-10-CM

## 2023-11-28 MED ORDER — EZETIMIBE 10 MG PO TABS: 10.0000 mg | ORAL_TABLET | Freq: Every day | ORAL | 3 refills | Status: AC

## 2023-11-28 NOTE — Patient Instructions (Signed)
 Medication Instructions:  Your physician has recommended you make the following change in your medication: Start ezetimibe 10 mg by mouth daily   *If you need a refill on your cardiac medications before your next appointment, please call your pharmacy*  Lab Work: none If you have labs (blood work) drawn today and your tests are completely normal, you will receive your results only by: MyChart Message (if you have MyChart) OR A paper copy in the mail If you have any lab test that is abnormal or we need to change your treatment, we will call you to review the results.  Testing/Procedures: Your physician has requested that you have a lexiscan myoview. For further information please visit [email or web address] Please follow instruction sheet, as given.   Follow-Up: At [facility], you and your health needs are our priority.  As part of our continuing mission to provide you with exceptional heart care, our providers are all part of one team.  This team includes your primary Cardiologist (physician) and Advanced Practice Providers or APPs (Physician Assistants and Nurse Practitioners) who all work together to provide you with the care you need, when you need it.  Your next appointment:   3 month(s)  Provider:   Yates Decamp, MD     We recommend signing up for the patient portal called "MyChart".  Sign up information is provided on this After Visit Summary.  MyChart is used to connect with patients for Virtual Visits (Telemedicine).  Patients are able to view lab/test results, encounter notes, upcoming appointments, etc.  Non-urgent messages can be sent to your provider as well.   To learn more about what you can do with MyChart, go to [email or web address].   Other Instructions        1st Floor: - Lobby - Registration  - Pharmacy  - Lab - Cafe  2nd Floor: - PV Lab - Diagnostic Testing (echo, CT, nuclear med)  3rd Floor: - Vacant  4th Floor: - TCTS (cardiothoracic  surgery) - AFib Clinic - Structural Heart Clinic - Vascular Surgery  - Vascular Ultrasound  5th Floor: - HeartCare Cardiology (general and EP) - Clinical Pharmacy for coumadin, hypertension, lipid, weight-loss medications, and med management appointments    Valet parking services will be available as well.

## 2023-11-28 NOTE — Progress Notes (Signed)
 Cardiology Office Note:  .   Date:  11/28/2023  ID:  [name], DOB September 11, 1950, MRN [id number] PCP: Irven Coe, MD  [facility] Providers Cardiologist:  Yates Decamp, MD   History of Present Illness: .   [name] is a 73 y.o. Caucasian female patient with hypertension, hypercholesterolemia, morbid obesity with OSA not using CPAP, diabetes mellitus type 2 with stage IIIa chronic kidney disease referred to me for evaluation of chest pain and aortic stenosis.  Discussed the use of AI scribe software for clinical note transcription with the patient, who gave verbal consent to proceed.  presents with intermittent chest pain. The pain is unpredictable, occurring at any time, and lasts for a few minutes. She describes the pain as located in the center of her chest. She has a history of a pericardial window procedure in 2007 for fluid around the heart.  [name] also reports that she is not very physically active due to sedation from her medications, Seroquel and Lamictal, and physical limitations from a bad knee and hip. Despite these limitations, she manages to do household chores such as vacuuming and cleaning. She also reports weight gain due to increased appetite, a side effect of her medication.  [name]'s husband, [name], confirms her symptoms and adds that she sometimes complains about her heart hurting. He also mentions that she has lost weight in the past, which helped to lower her blood pressure. However, she has recently gained some weight back, which may be affecting her blood pressure control.  Labs   External Labs:  Care Everywhere labs 10/31/2023:  Total cholesterol 156, triglycerides 125, HDL 51, LDL 83  TSH normal at 2.37, A1c 5.1%.  Serum glucose 103 mg, BUN 19, creatinine 1.02, EGFR 59 mL, potassium 4.7.  PCP faxed labs 08/04/2023:  Serum glucose 88 mg, BUN 19, creatinine 1.03, EGFR 58 mL, potassium 4.8, calcium albumin corrected mildly elevated at 11.06  (8.60-10.30).  Hb 13.1/HCT 38.4, platelets 275, normal indicis.  TSH normal at 1.17.  PTH normal.  Review of Systems  Cardiovascular:  Positive for chest pain and dyspnea on exertion. Negative for leg swelling.  Musculoskeletal:  Positive for back pain.   Physical Exam:   VS:  BP (!) 142/88 (BP Location: Left Arm, Patient Position: Sitting, Cuff Size: Large)   Pulse 75   Resp 14   Ht 5\' 5"  (1.651 m)   Wt 217 lb 9.6 oz (98.7 kg)   SpO2 97%   BMI 36.21 kg/m    Wt Readings from Last 3 Encounters:  11/28/23 217 lb 9.6 oz (98.7 kg)  12/25/22 246 lb 3.2 oz (111.7 kg)  12/17/22 275 lb (124.7 kg)     Physical Exam Constitutional:      Appearance: She is morbidly obese.  Neck:     Vascular: Carotid bruit (conducted AS murmur) present. No JVD.  Cardiovascular:     Rate and Rhythm: Normal rate and regular rhythm.     Pulses: Intact distal pulses.     Heart sounds: S1 normal and S2 normal. Murmur heard.     Midsystolic murmur is present with a grade of 2/6 at the upper right sternal border radiating to the neck.     No gallop.  Pulmonary:     Effort: Pulmonary effort is normal.     Breath sounds: Normal breath sounds.  Abdominal:     General: Bowel sounds are normal.     Palpations: Abdomen is soft.  Musculoskeletal:  Right lower leg: No edema.     Left lower leg: No edema.    Studies Reviewed: .    Carotid artery duplex 12/23/2022 for syncope Bilateral ICA smooth with very mild heterogenous plaque without stenosis. Bilateral antegrade vertebral artery flow.  ECHOCARDIOGRAM COMPLETE 07/26/2023 1. Left ventricular ejection fraction, by estimation, is 60 to 65%. The left ventricle has normal function. The left ventricle has no regional wall motion abnormalities. There is mild concentric left ventricular hypertrophy. Left ventricular diastolic parameters are consistent with Grade I diastolic dysfunction (impaired relaxation). 2. Right ventricular systolic function is normal.  The right ventricular size is normal. There is normal pulmonary artery systolic pressure. 3. No evidence of mitral valve regurgitation. 4. There is moderate calcification of the aortic valve. Aortic valve regurgitation is mild. Mild to moderate aortic valve stenosis. 5. There is mild dilatation of the ascending aorta, measuring 38 mm. 6. The inferior vena cava is normal in size with greater than 50% respiratory variability, suggesting right atrial pressure of 3 mmHg. 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  Poor R wave progression, cannot exclude anterolateral infarct old. Compared to 12/22/2022, Q waves in the inferior leads is new.  Poor R wave progression was previously noted. Confirmed by Delrae Rend [phone]) on 11/28/2023 4:05:37 PM    Medications and allergies    Allergies  Allergen Reactions   Maxzide [Triamterene-Hctz] Other (See Comments)    Hurt patient's kidneys   Celexa [Citalopram Hydrobromide] Nausea Only   Metformin And Related Nausea And Vomiting   Other Nausea And Vomiting and Other (See Comments)    Unnamed medication hurt the patient's kidneys   Reglan [Metoclopramide] Nausea Only   Risperdal [Risperidone] Nausea Only and Other (See Comments)    Also caused hallucinations   Trazodone And Nefazodone Nausea Only   Zestril [Lisinopril] Nausea Only   Lithium Palpitations and Other (See Comments)    Shakes and delusional pt started seeing things       Current Outpatient Medications:    acetaminophen (TYLENOL) 325 MG tablet, Take 650 mg by mouth every 6 (six) hours as needed for mild pain or headache., Disp: , Rfl:    amLODipine (NORVASC) 5 MG tablet, Take 5 mg by mouth every evening., Disp: , Rfl:    aspirin 81 MG  tablet, Take 81 mg by mouth daily., Disp: , Rfl:    ezetimibe (ZETIA) 10 MG tablet, Take 1 tablet (10 mg total) by mouth daily., Disp: 90 tablet, Rfl: 3   famotidine (PEPCID) 20 MG tablet, Take 20 mg by mouth in the morning and at bedtime., Disp: , Rfl:    furosemide (LASIX) 20 MG tablet, Take 20 mg by mouth daily as needed (for severe swelling)., Disp: , Rfl:    lamoTRIgine (LAMICTAL) 150 MG tablet, TAKE 1 TABLET TWICE DAILY, Disp: 180 tablet, Rfl: 3   levocetirizine (XYZAL) 5 MG tablet, Take 5 mg by mouth every evening., Disp: , Rfl:    levothyroxine (SYNTHROID) 100 MCG tablet, Take 100 mcg by mouth daily before breakfast., Disp: , Rfl:    lisinopril (ZESTRIL) 20 MG tablet, Take 1 tablet (20 mg total) by mouth daily., Disp: , Rfl:    metoprolol succinate (TOPROL-XL) 50 MG  24 hr tablet, Take 50 mg by mouth daily., Disp: , Rfl:    Multiple Vitamin (MULTIVITAMIN) tablet, Take 1 tablet by mouth daily., Disp: , Rfl:    polyethylene glycol (MIRALAX / GLYCOLAX) packet, Take 17 g by mouth 2 (two) times daily as needed for mild constipation., Disp: , Rfl:    potassium chloride SA (K-DUR,KLOR-CON) 20 MEQ tablet, Take 20 mEq by mouth daily., Disp: , Rfl:    propranolol (INDERAL) 10 MG tablet, TAKE 1 TO 2 TABLETS BY MOUTH TWICE A DAY AS NEEDED FOR ANXIETY, Disp: 120 tablet, Rfl: 0   QUEtiapine (SEROQUEL XR) 400 MG 24 hr tablet, TAKE 1 TABLET AT 7:30PM, Disp: 90 tablet, Rfl: 0   QUEtiapine (SEROQUEL) 200 MG tablet, Take 1 tablet (200 mg total) by mouth at bedtime., Disp: 90 tablet, Rfl: 0   QUEtiapine (SEROQUEL) 300 MG tablet, TAKE 1 TABLET AT BEDTIME, Disp: 90 tablet, Rfl: 0   simvastatin (ZOCOR) 40 MG tablet, Take 1 tablet (40 mg total) by mouth every evening., Disp: 30 tablet, Rfl: 0   tirzepatide (MOUNJARO) 10 MG/0.5ML Pen, Inject 10 mg into the skin once a week., Disp: , Rfl:    Meds ordered this encounter  Medications   ezetimibe (ZETIA) 10 MG tablet    Sig: Take 1 tablet (10 mg total) by mouth  daily.    Dispense:  90 tablet    Refill:  3     Medications Discontinued During This Encounter  Medication Reason   mirtazapine (REMERON) 15 MG tablet Change in therapy   sertraline (ZOLOFT) 50 MG tablet Patient Preference   QUEtiapine (SEROQUEL) 100 MG tablet Dose change     ASSESSMENT AND PLAN: .      ICD-10-CM   1. Precordial pain  R07.2 EKG 12-Lead    Cardiac Stress Test: Informed Consent Details: Physician/Practitioner Attestation; Transcribe to consent form and obtain patient signature    MYOCARDIAL PERFUSION IMAGING    2. Nonspecific abnormal electrocardiogram (ECG) (EKG)  R94.31 Cardiac Stress Test: Informed Consent Details: Physician/Practitioner Attestation; Transcribe to consent form and obtain patient signature    MYOCARDIAL PERFUSION IMAGING    3. Nonrheumatic aortic valve stenosis  I35.0     4. Primary hypertension  I10     5. Hypercholesteremia  E78.00 ezetimibe (ZETIA) 10 MG tablet      1. Precordial pain Symptoms are very atypical and hard to differentiate as she has sedentary lifestyle but does admit to doing household chores without significant limitations symptoms with chest discomfort and shortness of breath and sometimes not.  In view of her risk factors including hypertension, hypercholesterolemia and diabetes mellitus, we will schedule her for Lexiscan nuclear stress test.   She also has new EKG abnormalities suggesting inferior infarct.  Due to back pain and arthritis, unable to do exercise stress test.  2. Nonrheumatic aortic valve stenosis She has mild to moderate aortic valve stenosis and is currently asymptomatic. No immediate intervention is required, but regular follow-up is necessary to monitor the condition.  I reviewed her echocardiogram that was done recently.  3. Primary hypertension Her blood pressure is well-managed, ranging from 120/80 mmHg to 125/85 mmHg. One antihypertensive medication was previously discontinued due to weight loss. She  is currently on lisinopril and metoprolol. Blood pressure may increase with weight gain, so close monitoring is essential. Consider adjusting the antihypertensive regimen if blood pressure increases.  4. Hypercholesteremia Cholesterol levels are elevated, requiring additional pharmacological intervention in view of diabetes mellitus goal LDL <70. Prescribe  Zetia for cholesterol management and send the prescription to the mail order pharmacy.  Continue simvastatin 40 mg daily.  5. Obesity   Weight gain is attributed to increased appetite from Seroquel and Lamictal. Her current weight is above the desired level. She is on Mounjaro for weight management, with plans to increase the dosage to 15 mg. Advise her on slow eating habits to enhance Mounjaro efficacy, as rapid eating reduces drug effectiveness. Encourage weight loss to aid in blood pressure control.  Although previously diabetic, with initiation of Mounjaro, A1c has reduced significantly.  Office visit in 2 to 3 months or sooner if she has persistent symptoms or worsening symptoms.  Husband is present at the bedside and all questions answered and is aware to contact us if needed.    Signed,  Yates Decamp, MD, [facility] 11/28/2023, 6:34 PM [facility] [address] [place], [place] [address] Phone: [phone]. Fax:  [phone]

## 2023-11-29 ENCOUNTER — Encounter (HOSPITAL_COMMUNITY): Payer: Self-pay

## 2023-11-30 ENCOUNTER — Telehealth (HOSPITAL_COMMUNITY): Payer: Self-pay | Admitting: *Deleted

## 2023-11-30 ENCOUNTER — Other Ambulatory Visit: Payer: Self-pay | Admitting: Psychiatry

## 2023-11-30 DIAGNOSIS — F4001 Agoraphobia with panic disorder: Secondary | ICD-10-CM

## 2023-11-30 DIAGNOSIS — F411 Generalized anxiety disorder: Secondary | ICD-10-CM

## 2023-11-30 NOTE — Telephone Encounter (Signed)
 Spoke with patient's husband to remind him about the STRESS TEST ON 12/01/23. Patient is very nervous about test.

## 2023-12-01 ENCOUNTER — Encounter: Payer: Self-pay | Admitting: Cardiology

## 2023-12-01 ENCOUNTER — Ambulatory Visit (HOSPITAL_COMMUNITY): Attending: Cardiovascular Disease

## 2023-12-01 DIAGNOSIS — R9431 Abnormal electrocardiogram [ECG] [EKG]: Secondary | ICD-10-CM | POA: Insufficient documentation

## 2023-12-01 DIAGNOSIS — R072 Precordial pain: Secondary | ICD-10-CM | POA: Diagnosis not present

## 2023-12-01 LAB — MYOCARDIAL PERFUSION IMAGING
LV dias vol: 58 mL (ref 46–106)
LV sys vol: 21 mL
Nuc Stress EF: 64 %
Peak HR: 93 {beats}/min
Rest HR: 67 {beats}/min
Rest Nuclear Isotope Dose: 10.8 mCi
SDS: 1
SRS: 1
SSS: 2
ST Depression (mm): 0 mm
Stress Nuclear Isotope Dose: 31.2 mCi
TID: 1.23

## 2023-12-01 MED ORDER — REGADENOSON 0.4 MG/5ML IV SOLN
0.4000 mg | Freq: Once | INTRAVENOUS | Status: AC
  Administered 2023-12-01: 0.4 mg via INTRAVENOUS

## 2023-12-01 MED ORDER — TECHNETIUM TC 99M TETROFOSMIN IV KIT
10.8000 | PACK | Freq: Once | INTRAVENOUS | Status: AC | PRN
Start: 2023-12-01 — End: 2023-12-01
  Administered 2023-12-01: 10.8 via INTRAVENOUS

## 2023-12-01 MED ORDER — TECHNETIUM TC 99M TETROFOSMIN IV KIT
31.2000 | PACK | Freq: Once | INTRAVENOUS | Status: AC | PRN
Start: 2023-12-01 — End: 2023-12-01
  Administered 2023-12-01: 31.2 via INTRAVENOUS

## 2023-12-01 NOTE — Progress Notes (Signed)
 Normal nuclear stress test, no risk.

## 2023-12-03 ENCOUNTER — Other Ambulatory Visit: Payer: Self-pay | Admitting: Psychiatry

## 2023-12-03 DIAGNOSIS — F5105 Insomnia due to other mental disorder: Secondary | ICD-10-CM

## 2023-12-03 DIAGNOSIS — F4001 Agoraphobia with panic disorder: Secondary | ICD-10-CM

## 2023-12-03 DIAGNOSIS — F411 Generalized anxiety disorder: Secondary | ICD-10-CM

## 2023-12-03 NOTE — Telephone Encounter (Signed)
Discontinued by another provider

## 2023-12-22 ENCOUNTER — Encounter: Payer: Self-pay | Admitting: Psychiatry

## 2023-12-22 ENCOUNTER — Ambulatory Visit (INDEPENDENT_AMBULATORY_CARE_PROVIDER_SITE_OTHER): Admitting: Psychiatry

## 2023-12-22 ENCOUNTER — Telehealth: Payer: Self-pay | Admitting: Psychiatry

## 2023-12-22 DIAGNOSIS — F40240 Claustrophobia: Secondary | ICD-10-CM | POA: Diagnosis not present

## 2023-12-22 DIAGNOSIS — G3184 Mild cognitive impairment, so stated: Secondary | ICD-10-CM

## 2023-12-22 DIAGNOSIS — F4001 Agoraphobia with panic disorder: Secondary | ICD-10-CM

## 2023-12-22 DIAGNOSIS — F411 Generalized anxiety disorder: Secondary | ICD-10-CM

## 2023-12-22 DIAGNOSIS — F5105 Insomnia due to other mental disorder: Secondary | ICD-10-CM

## 2023-12-22 DIAGNOSIS — F3164 Bipolar disorder, current episode mixed, severe, with psychotic features: Secondary | ICD-10-CM

## 2023-12-22 NOTE — Progress Notes (Signed)
 [name] [id number] [name] 73 y.o.   Video Visit via My Chart  I connected with pt by video using My Chart and verified that I am speaking with the correct person using two identifiers.   I discussed the limitations, risks, security and privacy concerns of performing an evaluation and management service by My Chart  and the availability of in person appointments. I also discussed with the patient that there may be a patient responsible charge related to this service. The patient expressed understanding and agreed to proceed.  I discussed the assessment and treatment plan with the patient. The patient was provided an opportunity to ask questions and all were answered. The patient agreed with the plan and demonstrated an understanding of the instructions.   The patient was advised to call back or seek an in-person evaluation if the symptoms worsen or if the condition fails to improve as anticipated.  I provided 30 minutes of video time during this encounter.  The patient was located at home and the provider was located office. Session 330-400 P  Subjective:   Patient ID:  [name] is a 73 y.o. (DOB 1951/07/15) female.  Chief Complaint:  Chief Complaint  Patient presents with   Follow-up   Depression   Anxiety   Sleeping Problem    [name] presents to the office today for follow-up of anxiety and depression and poor cognition.  At her visit October 12, 2018.  Because of her poor cognition which did seem to get even worse lately with her memory and given a negative unremarkable medical work-up by her primary care doctor the decision was made to change her from Xanax  which she has been on for years to lorazepam .  Lorazepam  often has less cognitive side effects than does alprazolam .  She had a lot of difficulty with headaches and insomnia with the transition.  She called several times in that transition.  Her husband reported that her cognition was better after the  transition however.  She was satisfied with the Ativan .  There was an attempt to reduce her lamotrigine  to 150 mg also in hopes of improving cognition but it was uncertain at the last visit where there she had actually done so.  visit December 2020 without med changes.  She had continued to do cognitively better off alprazolam  and on lorazepam .  seen March 2021.   Better than December visit.  Feeling good and less down.  Adjusting time of med helped excessively sleep.  Brief hypomanic episode resolved where she cleaned all night.  Sleep [name] hours . No med changes.  12/23/19 appt.  Noted: More depression, crying and racing thoughts and distressed to the point of wondering if needed the hospital.  Sleep got worse and needed Seroquel  IR 200 also bc went 2 days without sleep.  It worked and helped sleep.  Worry a lot and melancholy this week.  Cries over what happens dealing with her daugher.[name] has prostate and kidney cancer again.  Had surgery June 10, 2019 and she's cried a lot.  He's incontinent and very uncomfortable.  He usually has positive attitude.   Worry over his health and how she'll do if something happens to him.  She's afraid of Covid.  Trusting in God usually.  She realizes she  Needs to drive occasionally bc of his health.  [name]'s father had prostate CA and lived to 24 yo.  Uncle died of it.  Not markedly depressed. Can't walk much dT plantar  fascitis. Also stressed over [name] yo not doing school work and causing problems.  Her father [name] is a bad parent and not helpful.  Stressed over [employer] and downs too and they have periodic conflict.   More down and anxious.   Guilt tendency.   Patient reports difficulty with sleep initiation or maintenance in part DT anxiety and racing thoughts.Intermittent sleep problems with days and nights mixed up.  Not napping. Not manic.[name] Denies appetite disturbance.  Patient reports that energy and motivation have been good.  Patient has some  difficulty with concentration.  Chronic memory problems.   Patient denies any suicidal ideation. Plan:   No changes in meds indicated except agree to take the extra Seroquel  200 IR HS for mixed manic sx that are worse right now.  May be having mixed seasonal sx.  02/20/2020 appointment with the following noted: Doing fairly good except chronic worry over D and Yolanda [name].  Some days cries a lot over it.  D having a hard time lately.  Still easily stressed but not more than usual. Stopped extra quetiapine  200 mg HS bc no longer needed it. Anxiety is chronic but at baseline as is depression and not manic now. H helps her take the meds.   Memory is still bad but has been worse when on Xanax  instead fo Ativan .  Sleep is fine from [name] to 9 which is much better than in the past.  Occ spells of staying up for 2 days and then needs to take quetiapine .   H [name] goes for FU soon for cancer check up. Plan: no med changes  05/21/20 appt with following noted: "A lot of problems".  Can't drive bc fear and anxiety.  Needed to drive H and couldn't. Fall off toilet twice. Dizzy and HA 2 days ago with a lot of crying and stress with daughter. Leaving home even coming here causes anxiety.    Cataract surgery twice. Fear of [name] being diagnosed with recurrent cancer. Chronic anxiety greather than depression.   Don't know what to do with daughter. Sleep, appetite and energy are OK. Tolerating meds. Sees [name] one day weekly. H helps with meds bc she's forgetful and easily confused.  Loses train of thoughts. Dog has separation anxiety and so they don't go out much.  Plan: no med changes  08/18/20 appt with following noted: 2 ER visitis with vertigo.  RX diazepam  and meclizine . Rarely takes former but takes latter regularly. Shakes so bad it made her cry and scream.  Started PT. Continues lorazepam  low dose 0.5 mg AM and 1.0 mg HS.  Still has dizziness and vertigo. Memory is still bad and H administers meds with  pill box. Chronic anxiety.  Depression manageable. Sleep OK and tolerates meds otherwise. Ethyl Hering driving her crazy. Plan: no med changes  11/17/20 appt with following noted: Has had periods of vertigo and meds for it.  Then had manic sx with cleaning and couldn't sleep for 24 hours. Stopped quetiapine  200 and continued quetiapine  XR 800. Also started diazepam  5 mg BID for vertigo and continued lorazepam  for anxiety.  No meclizine  now.  Vertigo stopped and manic sx stopped.  Tolerating meds now. Still feels Ethyl Hering is mean to her too often and she will cry over it. Mandy's ExH [name] in jail and is the father of Mandy's D [name] 52 yo.  Will be there for 3 mos. Not markedly depressed.  But hates herself some days.  Still memory problems about  the same.  Sleeping OK now.  Periods of mood swings. Easily anxious with relatives visiting.   Plan: Mixed manic sx resolved for now. No med changes today.  02/17/2021 appointment with the following noted: Not good.  "A whole lof of everything."  Shaking for mos. It comes and goes.  Shaking at times makes her think she'll have a nervous breakdown.   Went to ER and dx vertigo.  Has tried meclizine  and Valium  but scared to take it.  Big shakes again today.  Memory is not good. Sleep to escape anxiety.  Scared to leave home. Not sure what brings on the spells of shaking. Today sx hit her as soon as she awoke.  Plan: No med changes  03/12/2021 phone call: Patient with a lot of anxiety.  Husband reported the patient had been hallucinating and was paranoid that he had left her.  Some confusion over identity of people that were around her.  It was suggested that she have medical work-up because it sounded like she had delirium and may have a UTI causing that. 03/17/2021 psychiatric hospitalization. 03/22/2021 MD response:  Note Patient has been very unstable with bipolar disorder mixed symptoms lately also with some delirium of unknown cause.  She will be seeing her  primary care physician tomorrow.  Due to the severity of her symptoms it is suggested that she add haloperidol  2 mg nightly to her current quetiapine  800 mg nightly since we cannot increase the quetiapine  dose further.  This may help with her confusion and agitation.  It is okay to talk with her husband because he administers her medication and because she is too confused to understand the instructions.     04/16/21 H called with following:  Pt.'s husband called.  She is currently in [facility] waiting for an inpatient pyschiatric bed.  Pt advised her husband that Dr Toi Foster told her to stop taking the Lorazepam .  He said he is not aware of that chang in her prescription.  He would like someone to call him and advise if there were any changes to her meds so he stays aware and can let the hospital know if needed. Husband was informed we had not made any medication changes and would not have suggested she abruptly stopped lorazepam  which could cause withdrawal and other kinds of problems.  05/11/2021 appointment with the following noted:  spoke with H for info At hospital was taken off Ativan , buspirone , and Seroquel  reduced to 500 mg HS (as 1 of XR 400 mg  and 1/2 of 200 mg IR quetiapine ).  Had sedative SE at 200 mg queiapinte !R.  Off risperidone  and haloperidol .   Hospitalized at [facility] for 9 days.  They didn't feel communication was good with them. But she is better now than she was.  Had been psychotic PTH.  Last 2-3 days getting more hyper and can't wind down and hard time going to sleep.  Came home 05/03/21. She didn't like the restrictions at the hospital.  No panic lately and wants something to help her sleep. Plan; Mixed manic sx including psychosis recently but not psychotic now but insomnia.. Will get more psychotic if she doesn't sleep but apparently didn't tolerate Seroquel  IR 200 mg HS well so will increase to quetiapine  IR 150 mg HS and XR 400 mg nightly. Continue  lamotrigine .    [name]/21/22 appt:  [place]. H thinks she's doing goood. Doing very fine.  No trouble sleeping.  Sometimes in morning  feels sad bc she knows she will have racing thoughts, but it resolves after a little while.  Racing thoughts since hospital.  Worries over money chronically. To bed 1015-7:30.  Maybe longer which is normal. H administers meds.  Some wordfinding problems.  Does journal which helps. No SE with meds.  Yesterday was hard with anxiety but today is better.  On lamotrigine  150 BID, SERoquel  XR 400 + Seroquel  IR 150 mg HS. Plan: Much better but not fully resolved.  Mixed manic sx including psychosis recently but not psychotic but insomnia last visit resolved with the following... apparently didn't tolerate Seroquel  IR 200 mg HS well so increased quetiapine  IR 150 mg HS and XR 400 mg nightly and now she is sleeping but still having some racing thoughts. Continue lamotrigine  150 BID.    07/14/2021 appointment with the following noted: Bad day yesterday feeling very depressed without anxiety. Not sleeping well lately.  Thinks it's related to less Seroquel  than in the past.  Some EMA.  Always normal slept a lot. Compulsive cleaning and everything in it's place.  Arranges soap dispensers. Cleaning in the middle of the night. No recent psychotic sx but still racing thoughts and chronic $ worries. Still afraid to leave home.  09/20/2021 appt noted: Great overall.  Occ insomnia with EMA.  Still worry but tries not let it consume me.  Still agoraphobia and hates to go out to dentists and doctors.  Challenge to go to dentist.  Can still dread things. Brief nap in daytimes. No SE H administers meds. Seroquel  300 mg HS, XR 400 mg HS.  Lamotrigine  150 Patient reports stable mood and denies depressed or irritable moods.   Patient denies difficulty with sleep initiation or maintenance. Denies appetite disturbance.  Patient reports that energy and motivation have been good.  Patient  denies any difficulty with concentration.  Patient denies any suicidal ideation.  01/20/22 appt noted: also talked with [name] Doesn't like waiting 4 mos between appts. Was manic before increasing Seroquel  with irritability, not sleeping a couple of days and excessive cleaning. Seroquel  increased to IR 400 mg HS.  No SE I feel much Better now.  Doing better at time with anxiety.   Chronic worry over D and Yolanda. Yolanda with psych problems. Sleeping good again. Racing thoughts are better but not gone Tolerating meds.   No further changes desired.   Plan: Seroquel  IR 400 mg HS well and XR 400 mg nightly abc recent manic sx better with increase dose. Continue lamotrigine  150 mg twice daily  04/28/2022 appointment noted: Trouble sleeping without extra 100 mg quetiapine  HS and takes XR 400 mg and IR 300 mg at 730 pm.  Otherwise will be up until 3 AM.  Sleep from 11 until 830.  Good unless anxious. Doing fairly well except 2 weeks bad spell from July 31 related to [name] receiving call from woman he worked with 8 years ago and triggered fear ans suspiciousness and insecurity and overwhelmed her.  She cried and over reacted.  Rough 2 weeks but otherwise ok. Real worried about [name] who is self cutting.  Seeing therapist and psychiatrist.  Her father is terrible and inattentive.  She is 73 yo and never happy. I still have agoraphobia and doesn't want to go ut places.  Her dog also has severe anxiety and doesn't like to be alone too.  Still fearul about money.  08/03/22 appt noted: Doing pretty well overall.  Chronic worry over money.  Met old friend on  Facebook.  She would contact Leary Provencal a lot.  Was making her sad.  Would cry every time the woman called.  ,This was wearing her out bc woman was hyperverbal.  Cut the woman off.  Since stopped talking to her then no longer crying. When starts to get dark then racing thoughts.  Driving me crazy.  Chronic worry .  Chronic memorhy problems and tendency to be obsessed over  fear of dementia. Irritable and confused.  Might nap 30 min and rest her mind and awakens feeling better. Continues the same meds: lamotrigine  and quetiapine . Gained 20# last year. Plan: Have pushed Seroquel   as far as possible so start another mood stabilizer Trileptal  and increase to [name] mg AM and 300 mg pM  08/09/2022 phone call: She took oxcarbazepine  150 mg Friday and Saturday and developed severe abdominal pain, bowel and bladder incontinence, and impaired vision.  She skipped it on Sunday and symptoms resolved.  She took it again yesterday and the side effects returned.  States it did help with the racing thoughts but she is unable to tolerate it.  That was just on half of oxcarbazepine  tablet. MD response: DC oxcarbazepine  due to side effects.  10/17/22 appt noted: Started Monjauro today. Still racing thoughts and cleaning a lot.  More anxiety than depression.  Rough road.  Worries about everything.   Thoughts jump from one thing to another.  Can't get away from it.  Can't focus.  Hard to take instruction bc of this.    12/20/22 appt noted: Not good.  Kidney stone. More anxiety last couple of mos. Get something on her mind and she cannot get it off.  Obsesses on things that don't matter.  Trouble going to sleep.  Couldn't go into store the other day dT panic and agoraphobia.   Occ dep but nothing to worry about.  More panic.   Lost 15 # on Monjaro.   Plan start sertraline  for anxiety  03/03/23 appt noted: TC Been hosp for kidney px kidney stone.  Glad to be over it.  .   Lost 42 # since April 1 with Monjauro.  Eating better.   Anxiety is better with sertraline  50 mg marked benefit with very little anxiety.  Much happier and less crying.  Better acceptance and self talk better than ever before.   Still some dep which interferes with function at times but not as severe. I'm doing ok.   Tolerating psych meds without problems.  05/03/23 appt noted:  Psych med: lamotrigine  150 BID,  Quetiapine  ER 400 mg pm and IR 300-400 mg HS, sertraline  50 for anxiety. Real sleepy today after taking quetiapine  200 mg , more than usual.  Only takes it prn.  Usually getting enough sleep.  But sometimes keyed up and can't go to sleep.   Been feeling good overall.  Occ bad days.  For the most part doing fine.   Does "thrifting" which lifts her spirits about 1 time weekly.   It takes her mind off worries.   No marked dep and anxiety is manageable. Lost 55#.   Doing fine with [name].   [name] 73 yo.  Quiet girl. No med change desired.  07/05/23 appt noted:  In person  Had episode of anxiety with severe worry and obsessing over money at times and bad  episode a couple of weeks ago. Lost 63# on Monjaro.  SE hair thinning.  Brittle nails. Can report many potential SE.  Better mobility now.  "Very nice  emotionally".   Now 212# and goal is 199#.   Blood sugar runs in 70's fasting.   No insulin .  Exercising too.   No sig dep.  I don't have dep like I did years ago but have bad anxiety.  Still can't drive with anxiety and memory px.   Sleep at night is better.  STM is "so bad".   H prepares meds.  Forgets how to do tasks at times.   Terrible anxiety.   Psych med: lamotrigine  150 BID, Quetiapine  ER 400 mg pm and IR 300-400 mg HS, sertraline  50 for anxiety. Uses routine to help with memory.   Plan: Trial low dose propranolol  [name]-20 mg BID prn anxiety bc it helped D's anxiety.    09/06/23 apppt noted: Psych med: lamotrigine  150 BID, Quetiapine  ER 400 mg pm and IR 300-400 mg HS, sertraline  50 for anxiety. Tried propranolol  for anxiety up to 20 BID.  It does help some.  Will get stubborn and not take it.   Had a lot anxiety and trouble sleeping.  When bad I clean like the devil.  Only 1 hour sleep last night.    Hard to learn new things since childhood like technology.  Can't use tablet without his help.   Last night took extra 100 mg Seroquel  and didn't help. No trigger for more anxiety, tension,  scared.  Even real nervous before MD called.   Lost 62#  on Mounjaro.  Feels better physically.   Some people tell her she has dementia.    Worried Seroquel  might cause that.  No clear pcpt bc long term memory problems.  Can't deal with thinking I might have dementia.   Chronic anxiety since childhood.   Has a friend who calls and a lot and chronically is negative and complains all the time.  Sleep worse for 2-3 weeks.   Plan: DC sertraline  and start mirtazapine  15 mg HS for sleep and anxiety  11/15/23 appt noted:  H [name] on phone call Psych med: lamotrigine  150 BID, Quetiapine  ER 400 mg pm and IR 300-400 mg HS, mirtazapine  15 HS Tried propranolol  for anxiety up to 20 BID.  Having dep and anxiety bad.  Sleeping is terrrible.  Went up to 3 days without sleep.  About 2 weeks. No trigger.  Cannot go to sTrying to adapt to it and tolerate it.   Trouble focusing and memory issues worse.  Gets easily overwhelmed and stimulated.  Will get lost in stores and large areas. H notices.  Episodic cleaning episodes over the top.   Loses train of thought Plan: Stop  mirtazapine  DT NR Increase Seroquel  XR 400 mg and add additional IR Seroquel  500 mg HS  12/22/23 APPT NOTED: with [name] on phone call Med: lamotrigine  150 BID, Quetiapine  ER 400 mg pm and IR 500 mg HS,  Very well .  The med change helped.  Sleep is better with some brief awakening.   The med change very helpful.  Usually asleep in 45 mins.   No SE other than sleepiness.  But pleased with med changes. Not dep.  Not as anxious.  Cooking again and wasn't before.      Prior psychiatric medication trials include  paroxetine , Lexapro, sertraline  50 Sertraline  50 daily.   risperidone  4 mg which was sedating, aripiprazole,  perphenazine,  Seroquel  1000,  olanzapine for anxiety, lithium which was not tolerated even at very low dosages.  Topamax for anxiety, Oxcarbazepine  150 mg bowel and bladder incontinence and impaired vision.  She was  intolerant of both Namenda and Aricept.   Lamotrigine  300.   Xanax , Ativan , diazepam  5 mg BID   This is not an exhaustive list.   Psych hospitalization 04/2021 at [facility] for psychosis.  At the visit in February 2020 Estelita was more acutely confused recently for no clear reason.   She had a negative medical work-up for causes of short-term memory problems and confusion.  Therefore the decision was made to try switching her from alprazolam  to lorazepam  at the lowest possible dose in hopes of improving her cognition.  This was successful and her cognition was improved significantly and her husband verified this.    Yolanda [name] seeing Dr. Dominica Friends D noted sig anxiety benefit with propranolol   Neuropsych testing mid 2019 at [facility] but results not on chart.    Review of Systems:  Review of Systems  Eyes:  Positive for visual disturbance.  Cardiovascular:  Negative for chest pain and palpitations.  Musculoskeletal:  Positive for arthralgias, back pain and gait problem.  Neurological:  Positive for dizziness. Negative for weakness.  Psychiatric/Behavioral:  Positive for decreased concentration. Negative for agitation, behavioral problems, confusion, dysphoric mood, hallucinations, self-injury, sleep disturbance and suicidal ideas. The patient is nervous/anxious. The patient is not hyperactive.     Medications: I have reviewed the patient's current medications.  Current Outpatient Medications  Medication Sig Dispense Refill   acetaminophen  (TYLENOL ) 325 MG tablet Take 650 mg by mouth every 6 (six) hours as needed for mild pain or headache.     amLODipine  (NORVASC ) 5 MG tablet Take 5 mg by mouth every evening.     aspirin  81 MG tablet Take 81 mg by mouth daily.     ezetimibe  (ZETIA ) [name] MG tablet Take 1 tablet ([name] mg total) by mouth daily. 90 tablet 3   famotidine  (PEPCID ) 20 MG tablet Take 20 mg by mouth in the morning and at bedtime.     furosemide  (LASIX ) 20 MG tablet Take 20 mg by mouth  daily as needed (for severe swelling).     lamoTRIgine  (LAMICTAL ) 150 MG tablet TAKE 1 TABLET TWICE DAILY 180 tablet 3   levocetirizine (XYZAL ) 5 MG tablet Take 5 mg by mouth every evening.     levothyroxine  (SYNTHROID ) 100 MCG tablet Take 100 mcg by mouth daily before breakfast.     lisinopril  (ZESTRIL ) 20 MG tablet Take 1 tablet (20 mg total) by mouth daily.     metoprolol  succinate (TOPROL -XL) 50 MG 24 hr tablet Take 50 mg by mouth daily.     Multiple Vitamin (MULTIVITAMIN) tablet Take 1 tablet by mouth daily.     polyethylene glycol (MIRALAX  / GLYCOLAX ) packet Take 17 g by mouth 2 (two) times daily as needed for mild constipation.     potassium chloride  SA (K-DUR,KLOR-CON ) 20 MEQ tablet Take 20 mEq by mouth daily.     propranolol  (INDERAL ) [name] MG tablet TAKE 1 TO 2 TABLETS BY MOUTH TWICE A DAY AS NEEDED FOR ANXIETY 360 tablet 0   QUEtiapine  (SEROQUEL  XR) 400 MG 24 hr tablet TAKE 1 TABLET AT 7:30PM 90 tablet 0   QUEtiapine  (SEROQUEL ) 200 MG tablet Take 1 tablet (200 mg total) by mouth at bedtime. 90 tablet 0   QUEtiapine  (SEROQUEL ) 300 MG tablet TAKE 1 TABLET AT BEDTIME 90 tablet 0   simvastatin  (ZOCOR ) 40 MG tablet Take 1 tablet (40 mg total) by mouth every evening. 30 tablet 0   tirzepatide (MOUNJARO) 12.5 MG/0.5ML Pen Inject 12.5 mg into the  skin once a week.     No current facility-administered medications for this visit.    Medication Side Effects: None  Allergies:  Allergies  Allergen Reactions   Maxzide [Triamterene-Hctz] Other (See Comments)    Hurt patient's kidneys   Celexa [Citalopram Hydrobromide] Nausea Only   Metformin And Related Nausea And Vomiting   Other Nausea And Vomiting and Other (See Comments)    Unnamed medication hurt the patient's kidneys   Reglan  [Metoclopramide ] Nausea Only   Risperdal  [Risperidone ] Nausea Only and Other (See Comments)    Also caused hallucinations   Trazodone And Nefazodone Nausea Only   Zestril  [Lisinopril ] Nausea Only   Lithium  Palpitations and Other (See Comments)    Shakes and delusional pt started seeing things      Past Medical History:  Diagnosis Date   Anxiety    Arthritis    Asthma    Bipolar 1 disorder (HCC)    CHF (congestive heart failure) (HCC)    Complication of anesthesia    left a bad taste in my mouth it has lasted since january    Depression    Diabetes mellitus    Hernia    umbilical   Hyperlipidemia    Hypertension    Hypothyroidism    Pericarditis, viral 2010 October   Sleep apnea    uses cpap setting of 17    Family History  Problem Relation Age of Onset   Diabetes Mother    Hypertension Mother    Hyperlipidemia Mother    Diabetes Father    Hypertension Father    Hyperlipidemia Father    Hypertension Sister     Social History   Socioeconomic History   Marital status: Married    Spouse name: Not on file   Number of children: Not on file   Years of education: Not on file   Highest education level: Not on file  Occupational History   Not on file  Tobacco Use   Smoking status: Never   Smokeless tobacco: Never  Substance and Sexual Activity   Alcohol use: No   Drug use: No   Sexual activity: Not on file  Other Topics Concern   Not on file  Social History Narrative   Not on file   Social Drivers of Health   Financial Resource Strain: Not on file  Food Insecurity: No Food Insecurity (12/22/2022)   Hunger Vital Sign    Worried About Running Out of Food in the Last Year: Never true    Ran Out of Food in the Last Year: Never true  Transportation Needs: No Transportation Needs (12/22/2022)   PRAPARE - Administrator, Civil Service (Medical): No    Lack of Transportation (Non-Medical): No  Physical Activity: Not on file  Stress: Not on file  Social Connections: Not on file  Intimate Partner Violence: Not At Risk (12/22/2022)   Humiliation, Afraid, Rape, and Kick questionnaire    Fear of Current or Ex-Partner: No    Emotionally Abused: No    Physically  Abused: No    Sexually Abused: No    Past Medical History, Surgical history, Social history, and Family history were reviewed and updated as appropriate.   Please see review of systems for further details on the patient's review from today.   Objective:   Physical Exam:  There were no vitals taken for this visit.  Physical Exam Neurological:     Mental Status: She is alert and oriented to person, place,  and time.     Cranial Nerves: No dysarthria.  Psychiatric:        Attention and Perception: Perception normal. She is inattentive.        Mood and Affect: Mood is anxious. Mood is not depressed. Affect is not labile or tearful.        Speech: Speech is not rapid and pressured or slurred.        Behavior: Behavior is cooperative.        Thought Content: Thought content normal. Thought content is not paranoid or delusional. Thought content does not include homicidal or suicidal ideation. Thought content does not include suicidal plan.        Cognition and Memory: Cognition normal. She exhibits impaired recent memory.        Judgment: Judgment normal.     Comments: Insight intact Dep, anxiety and cogniton better Talkative.       Lab Review:     Component Value Date/Time   NA 140 12/25/2022 0719   K 4.0 12/25/2022 0719   CL 106 12/25/2022 0719   CO2 26 12/25/2022 0719   GLUCOSE 99 12/25/2022 0719   GLUCOSE 154 (H) 09/01/2006 1100   BUN 13 12/25/2022 0719   CREATININE 0.99 12/25/2022 0719   CREATININE 0.93 07/19/2011 1110   CALCIUM 9.9 12/25/2022 0719   PROT 6.8 12/22/2022 0300   ALBUMIN 3.6 12/22/2022 0300   AST 19 12/22/2022 0300   ALT 15 12/22/2022 0300   ALKPHOS 76 12/22/2022 0300   BILITOT 0.7 12/22/2022 0300   GFRNONAA >60 12/25/2022 0719   GFRAA >60 03/23/2016 1933       Component Value Date/Time   WBC 7.4 12/22/2022 0300   RBC 4.17 12/22/2022 0300   HGB 13.1 12/22/2022 0300   HCT 38.8 12/22/2022 0300   PLT 204 12/22/2022 0300   MCV 93.0 12/22/2022 0300    MCH 31.4 12/22/2022 0300   MCHC 33.8 12/22/2022 0300   RDW 13.6 12/22/2022 0300   LYMPHSABS 1.2 04/15/2021 1831   MONOABS 0.4 04/15/2021 1831   EOSABS 0.1 04/15/2021 1831   BASOSABS 0.1 04/15/2021 1831   Prior lamotrigine  level in 2018 on this dosage was 3.6.  Not particularly high.  Per Dr. Scherrie Curt: Clinical Impressions: Cognitive complaints are likely due to underlying psychiatric disorder (bipolar disorder/anxiety disorder with racing thoughts). Diagnostic impressions based on test performances are limited due to the patient's severe inability to fully attend to the tasks. However, based on clinical presentation, I highly doubt the presence of a neurodegenerative dementia. It is much more likely that her racing thoughts and psychiatric disorders are interfering with cognitive   No results found for: "POCLITH", "LITHIUM"   No results found for: "PHENYTOIN", "PHENOBARB", "VALPROATE", "CBMZ"   .res Assessment: Plan:    Bipolar affective disorder, mixed, severe, with psychotic behavior (HCC)  Generalized anxiety disorder  Panic disorder with agoraphobia  Insomnia due to mental condition  Mild cognitive impairment  Claustrophobia   Leola Raisin has chronic severe anxiety with panic attacks as well as bipolar disorder with a history of significant lability.  She has had more severe instability with psychotic sx and cognitive problems off and on lately.   Sx mixed bipolar and anxiety with insomnia resolved with increase Seroquel  as below.  She also has mild cognitive impairment as a complicating factor.  She needs her husband's assistance to manage her medications.    Continue Seroquel  XR 400 mg nightly but add IR 500 mg HS.  This is above  usual max but she has tolerated in past and prefers this to change.  Consider switch to Caplyta or Saphris bc lower SE and may help anxiety but probably would not be covered by insurance.  Discussed potential metabolic side effects associated with  atypical antipsychotics, as well as potential risk for movement side effects. Advised pt to contact office if movement side effects occur.  Disc family's fear of LT SE.  However current meds work and without them sx unmanageable.  Has been able to stop BZ   Continue MVI.    Pt is chronically needy and requires extended appts DT chronic anxiety and easily stressed but much better with sertraline  low dose for anxiety. Cannot drive now and wasn't much before.    No evidence of medical cause for STM px.  FHX dementia including mother.   Pt doesn't feel able to fix her meds by herself.  Gives up quickly on task demands.    Continue  Seroquel  XR 400 mg and add additional IR Seroquel  500 mg HS Continue lamotrigine  150 mg BID  FU 2 mos  Nori Beat, MD, DFAPA   Future Appointments  Date Time Provider Department Center  12/25/2023  3:45 PM Charity Conch, DPM [facility]  02/20/2024  1:40 PM Knox Perl, MD [facility]  03/28/2024  1:30 PM [facility] DEXA 1 [facility]       No orders of the defined types were placed in this encounter.      -------------------------------

## 2023-12-22 NOTE — Telephone Encounter (Signed)
 [name]'s jury summons was denied. Need MD note. Placed in Dr. CC office box.

## 2023-12-25 ENCOUNTER — Ambulatory Visit: Admitting: Podiatry

## 2023-12-25 ENCOUNTER — Encounter: Payer: Self-pay | Admitting: Podiatry

## 2023-12-25 DIAGNOSIS — B351 Tinea unguium: Secondary | ICD-10-CM | POA: Diagnosis not present

## 2023-12-25 DIAGNOSIS — M79674 Pain in right toe(s): Secondary | ICD-10-CM

## 2023-12-25 DIAGNOSIS — M79675 Pain in left toe(s): Secondary | ICD-10-CM

## 2023-12-25 NOTE — Progress Notes (Unsigned)
 Subjective:   Patient ID: [name], female   DOB: 73 y.o.   MRN: [id number]   HPI Chief Complaint  Patient presents with   RFC    RM#75 RFC    73 year old female presents the office today with above concerns.  She states her nails are thick elongated she cannot do them herself.  No open lesions that she reports.      Objective:  Physical Exam  General: AAO x3, NAD  Dermatological: Nails are hypertrophic, dystrophic, brittle, discolored, elongated 10. No surrounding redness or drainage. Tenderness nails 1-5 bilaterally. No open lesions.   Vascular: Dorsalis Pedis artery and Posterior Tibial artery pedal pulses are 2/4 bilateral with immedate capillary fill time. There is no pain with calf compression, swelling, warmth, erythema.   Neruologic: Sensation decreased with Semmes Weinstein monofilament.  Musculoskeletal: No other areas of discomfort.   Gait: Unassisted, Nonantalgic.       Assessment:   Symptomatic onychomycosis     Plan:  -Treatment options discussed including all alternatives, risks, and complications -Etiology of symptoms were discussed -Sharply debrided nails x 10 to any complications or bleeding -We discussed treatment for neuropathy.  Will start with topical medications such as Aspercreme lidocaine , capsaicin cream. -Daily foot inspection  Return in about 3 months (around 03/26/2024) for nail trim .  Charity Conch DPM

## 2024-01-05 ENCOUNTER — Other Ambulatory Visit: Payer: Self-pay

## 2024-01-05 ENCOUNTER — Emergency Department (HOSPITAL_COMMUNITY)

## 2024-01-05 ENCOUNTER — Encounter (HOSPITAL_COMMUNITY): Payer: Self-pay | Admitting: Emergency Medicine

## 2024-01-05 ENCOUNTER — Emergency Department (HOSPITAL_COMMUNITY)
Admission: EM | Admit: 2024-01-05 | Discharge: 2024-01-05 | Disposition: A | Discharge status: Home / Self Care | Attending: Emergency Medicine | Admitting: Emergency Medicine

## 2024-01-05 DIAGNOSIS — Z79899 Other long term (current) drug therapy: Secondary | ICD-10-CM | POA: Insufficient documentation

## 2024-01-05 DIAGNOSIS — R55 Syncope and collapse: Secondary | ICD-10-CM | POA: Diagnosis not present

## 2024-01-05 DIAGNOSIS — Z7982 Long term (current) use of aspirin: Secondary | ICD-10-CM | POA: Insufficient documentation

## 2024-01-05 DIAGNOSIS — W19XXXA Unspecified fall, initial encounter: Secondary | ICD-10-CM | POA: Diagnosis not present

## 2024-01-05 DIAGNOSIS — I509 Heart failure, unspecified: Secondary | ICD-10-CM | POA: Diagnosis not present

## 2024-01-05 DIAGNOSIS — I10 Essential (primary) hypertension: Secondary | ICD-10-CM

## 2024-01-05 DIAGNOSIS — J45909 Unspecified asthma, uncomplicated: Secondary | ICD-10-CM | POA: Insufficient documentation

## 2024-01-05 DIAGNOSIS — E119 Type 2 diabetes mellitus without complications: Secondary | ICD-10-CM | POA: Insufficient documentation

## 2024-01-05 DIAGNOSIS — R42 Dizziness and giddiness: Secondary | ICD-10-CM | POA: Diagnosis not present

## 2024-01-05 DIAGNOSIS — I110 Hypertensive heart disease with heart failure: Secondary | ICD-10-CM | POA: Diagnosis not present

## 2024-01-05 DIAGNOSIS — R450 Nervousness: Secondary | ICD-10-CM | POA: Diagnosis not present

## 2024-01-05 LAB — BASIC METABOLIC PANEL WITH GFR
Anion gap: 6 (ref 5–15)
BUN: 19 mg/dL (ref 8–23)
CO2: 23 mmol/L (ref 22–32)
Calcium: 9.9 mg/dL (ref 8.9–10.3)
Chloride: 108 mmol/L (ref 98–111)
Creatinine, Ser: 1.07 mg/dL — ABNORMAL HIGH (ref 0.44–1.00)
GFR, Estimated: 56 mL/min — ABNORMAL LOW (ref >60)
Glucose, Bld: 94 mg/dL (ref 70–99)
Potassium: 3.6 mmol/L (ref 3.5–5.1)
Sodium: 137 mmol/L (ref 135–145)

## 2024-01-05 LAB — TROPONIN I (HIGH SENSITIVITY)
Troponin I (High Sensitivity): 6 ng/L (ref <18)
Troponin I (High Sensitivity): 6 ng/L (ref <18)

## 2024-01-05 LAB — BRAIN NATRIURETIC PEPTIDE: B Natriuretic Peptide: 48.6 pg/mL (ref 0.0–100.0)

## 2024-01-05 LAB — CBC
HCT: 38.7 % (ref 36.0–46.0)
Hemoglobin: 12.7 g/dL (ref 12.0–15.0)
MCH: 31.6 pg (ref 26.0–34.0)
MCHC: 32.8 g/dL (ref 30.0–36.0)
MCV: 96.3 fL (ref 80.0–100.0)
Platelets: 172 10*3/uL (ref 150–400)
RBC: 4.02 MIL/uL (ref 3.87–5.11)
RDW: 13.6 % (ref 11.5–15.5)
WBC: 7 10*3/uL (ref 4.0–10.5)
nRBC: 0 % (ref 0.0–0.2)

## 2024-01-05 MED ORDER — LISINOPRIL 10 MG PO TABS: 20.0000 mg | ORAL_TABLET | Freq: Every day | ORAL | Status: DC

## 2024-01-05 MED ORDER — LORAZEPAM 2 MG/ML IJ SOLN
1.0000 mg | Freq: Once | INTRAMUSCULAR | Status: AC | PRN
  Administered 2024-01-05: 1 mg via INTRAVENOUS
  Filled 2024-01-05: qty 1

## 2024-01-05 MED ORDER — AMLODIPINE BESYLATE 5 MG PO TABS
5.0000 mg | ORAL_TABLET | Freq: Every evening | ORAL | Status: DC
Start: 2024-01-05 — End: 2024-01-05

## 2024-01-05 MED ORDER — AMLODIPINE BESYLATE 5 MG PO TABS
5.0000 mg | ORAL_TABLET | Freq: Once | ORAL | Status: AC
  Administered 2024-01-05: 5 mg via ORAL
  Filled 2024-01-05: qty 1

## 2024-01-05 NOTE — ED Provider Notes (Signed)
 [place] EMERGENCY DEPARTMENT AT [facility] Provider Note   CSN: [id number] Arrival date & time: 01/05/24  9562     History  Chief Complaint  Patient presents with   Dizziness   HPI [name] is a 73 y.o. female with CHF, hypertension, hyperlipidemia, diabetes, asthma presenting for hypertension.  States the last few days she has noticed her blood pressure has been more high than usual also states she has been intermittently dizzy with room spinning sensation.  Not presently dizzy at this time.  Also reporting visual disturbance, seeing spots and blurriness in both eyes that comes and goes as well.  Denies headache. She denies chest pain shortness of breath.  Reports 4 days of diarrhea but no nausea vomiting or abdominal pain. States she did not take her BP meds this morning but is compliant otherwise.  Reports that she does have a history of vertigo and her symptoms are very similar to when she has had vertigo in the past.  HPI     Home Medications Prior to Admission medications   Medication Sig Start Date End Date Taking? Authorizing Provider  acetaminophen  (TYLENOL ) 325 MG tablet Take 650 mg by mouth every 6 (six) hours as needed for mild pain or headache.    [provider]  amLODipine  (NORVASC ) 5 MG tablet Take 5 mg by mouth every evening. 01/28/19   [provider]  aspirin  81 MG tablet Take 81 mg by mouth daily.    [provider]  ezetimibe  (ZETIA ) 10 MG tablet Take 1 tablet (10 mg total) by mouth daily. 11/28/23 02/26/24  Knox Perl, MD  famotidine  (PEPCID ) 20 MG tablet Take 20 mg by mouth in the morning and at bedtime.    [provider]  furosemide  (LASIX ) 20 MG tablet Take 20 mg by mouth daily as needed (for severe swelling).    [provider]  lamoTRIgine  (LAMICTAL ) 150 MG tablet TAKE 1 TABLET TWICE DAILY 11/25/23   Cottle, Carey G Jr., MD  levocetirizine (XYZAL ) 5 MG tablet Take 5 mg by mouth every evening.     [provider]  levothyroxine  (SYNTHROID ) 100 MCG tablet Take 100 mcg by mouth daily before breakfast.    [provider]  lisinopril  (ZESTRIL ) 20 MG tablet Take 1 tablet (20 mg total) by mouth daily. 12/28/22   Macdonald Savoy, MD  metoprolol  succinate (TOPROL -XL) 50 MG 24 hr tablet Take 50 mg by mouth daily.    [provider]  Multiple Vitamin (MULTIVITAMIN) tablet Take 1 tablet by mouth daily.    [provider]  polyethylene glycol (MIRALAX  / GLYCOLAX ) packet Take 17 g by mouth 2 (two) times daily as needed for mild constipation.    [provider]  potassium chloride  SA (K-DUR,KLOR-CON ) 20 MEQ tablet Take 20 mEq by mouth daily.    [provider]  propranolol  (INDERAL ) 10 MG tablet TAKE 1 TO 2 TABLETS BY MOUTH TWICE A DAY AS NEEDED FOR ANXIETY 11/30/23   Cottle, Kennedy Peabody., MD  QUEtiapine  (SEROQUEL  XR) 400 MG 24 hr tablet TAKE 1 TABLET AT 7:30PM 10/25/23   Cottle, Kennedy Peabody., MD  QUEtiapine  (SEROQUEL ) 200 MG tablet Take 1 tablet (200 mg total) by mouth at bedtime. 11/22/23   Cottle, Kennedy Peabody., MD  QUEtiapine  (SEROQUEL ) 300 MG tablet TAKE 1 TABLET AT BEDTIME 09/11/23   Cottle, Kennedy Peabody., MD  simvastatin  (ZOCOR ) 40 MG tablet Take 1 tablet (40 mg total) by mouth every  evening. 05/11/21   Cottle, Kennedy Peabody., MD  tirzepatide [facility]) 12.5 MG/0.5ML Pen Inject 12.5 mg into the skin once a week. 11/14/22   [provider]      Allergies    Maxzide [triamterene-hctz], Celexa [citalopram hydrobromide], Metformin and related, Other, Reglan  [metoclopramide ], Risperdal  [risperidone ], Trazodone and nefazodone, Zestril  [lisinopril ], and Lithium    Review of Systems   See HPI   Physical Exam   Vitals:   01/05/24 1242 01/05/24 1244  BP: (!) 147/89   Pulse: 74   Resp: 18 18  Temp:  98 F (36.7 C)  SpO2: 97%     CONSTITUTIONAL:  well-appearing, NAD NEURO:  GCS 15. Speech is goal oriented. No deficits appreciated to CN III-XII;  symmetric eyebrow raise, no facial drooping, tongue midline. Patient has equal grip strength bilaterally with 5/5 strength against resistance in all major muscle groups bilaterally. Sensation to light touch intact. Patient moves extremities without ataxia. Normal finger-nose-finger.  EYES:  eyes equal and reactive ENT/NECK:  Supple, no stridor  CARDIO:  regular rate and rhythm, appears well-perfused  PULM:  No respiratory distress, CTAB GI/GU:  non-distended, soft MSK/SPINE:  No gross deformities, no edema, moves all extremities  SKIN:  no rash, atraumatic  *Additional and/or pertinent findings included in MDM below   ED Results / Procedures / Treatments   Labs (all labs ordered are listed, but only abnormal results are displayed) Labs Reviewed  BASIC METABOLIC PANEL WITH GFR - Abnormal; Notable for the following components:      Result Value   Creatinine, Ser 1.07 (*)    GFR, Estimated 56 (*)    All other components within normal limits  CBC  BRAIN NATRIURETIC PEPTIDE  TROPONIN I (HIGH SENSITIVITY)  TROPONIN I (HIGH SENSITIVITY)    EKG EKG Interpretation Date/Time:  Friday Jan 05 2024 09:36:38 EDT Ventricular Rate:  72 PR Interval:  206 QRS Duration:  99 QT Interval:  381 QTC Calculation: 417 R Axis:   133  Text Interpretation: Sinus rhythm Left posterior fascicular block Confirmed by Angela Kell [phone]) on 01/05/2024 12:20:56 PM  Radiology MR Brain Wo Contrast (neuro protocol) Result Date: 01/05/2024 CLINICAL DATA:  73 year old female with dizziness upon waking. Hypertensive, neurologic deficit. EXAM: MRI HEAD WITHOUT CONTRAST TECHNIQUE: Multiplanar, multiecho pulse sequences of the brain and surrounding structures were obtained without intravenous contrast. COMPARISON:  Head CT 0759 hours today. FINDINGS: Brain: No restricted diffusion to suggest acute infarction. No midline shift, mass effect, evidence of mass lesion, ventriculomegaly, extra-axial collection or acute  intracranial hemorrhage. Cervicomedullary junction and pituitary are within normal limits. No cortical encephalomalacia or chronic cerebral blood products identified. Scattered small mostly subcortical white matter T2 and FLAIR hyperintensity is mild for age. Deep gray nuclei, brainstem and cerebellum appear normal for age. Vascular: Major intracranial vascular flow voids are preserved. Skull and upper cervical spine: Negative for age visible cervical spine. Visualized bone marrow signal is within normal limits. Hyperostosis of the calvarium, normal variant. Sinuses/Orbits: Postoperative changes to both globes. Paranasal Visualized paranasal sinuses and mastoids are stable and well aerated. Other: Grossly normal visible internal auditory structures. Negative visible scalp and face. IMPRESSION: 1. No acute intracranial abnormality. 2. Mild for age nonspecific cerebral white matter changes, most commonly due to chronic small vessel disease. Electronically Signed   By: Marlise Simpers M.D.   On: 01/05/2024 10:50   CT Head Wo Contrast Result Date: 01/05/2024 CLINICAL DATA:  73 year old female with dizziness upon waking. Hypertensive. Neurologic deficit. EXAM: CT  HEAD WITHOUT CONTRAST TECHNIQUE: Contiguous axial images were obtained from the base of the skull through the vertex without intravenous contrast. RADIATION DOSE REDUCTION: This exam was performed according to the departmental dose-optimization program which includes automated exposure control, adjustment of the mA and/or kV according to patient size and/or use of iterative reconstruction technique. COMPARISON:  Head CT 12/22/2022. FINDINGS: Brain: Cerebral volume is stable and normal for age. No midline shift, ventriculomegaly, mass effect, evidence of mass lesion, intracranial hemorrhage or evidence of cortically based acute infarction. Gray-white differentiation is stable and within normal limits for age. Vascular: Calcified atherosclerosis at the skull base. No  suspicious intracranial vascular hyperdensity. Skull: Stable and intact.  Incidental calvarium hyperostosis. Sinuses/Orbits: Tympanic cavities, Visualized paranasal sinuses and mastoids are clear. Other: No acute orbit or scalp soft tissue finding. IMPRESSION: Stable since last year and negative for age noncontrast Head CT. Electronically Signed   By: Marlise Simpers M.D.   On: 01/05/2024 08:36   DG Chest 1 View Result Date: 01/05/2024 CLINICAL DATA:  Hypertension. EXAM: CHEST  1 VIEW COMPARISON:  06/12/2020 FINDINGS: The cardio pericardial silhouette is enlarged. The lungs are clear without focal pneumonia, edema, pneumothorax or pleural effusion. No acute bony abnormality. Telemetry leads overlie the chest. IMPRESSION: Enlargement of the cardiopericardial silhouette without acute cardiopulmonary findings. Electronically Signed   By: Donnal Fusi M.D.   On: 01/05/2024 07:57    Procedures Procedures    Medications Ordered in ED Medications  LORazepam  (ATIVAN ) injection 1 mg (1 mg Intravenous Given 01/05/24 0954)  amLODipine  (NORVASC ) tablet 5 mg (5 mg Oral Given 01/05/24 1240)    ED Course/ Medical Decision Making/ A&P                                 Medical Decision Making Amount and/or Complexity of Data Reviewed Labs: ordered. Radiology: ordered.  Risk Prescription drug management.   Initial Impression and Ddx 73 yo well appearing female presenting for hypertension and intermittent dizziness.  Exam was unremarkable she was notably hypertensive initially.  DDx includes stroke, hypertensive emergency, arrhythmia, ACS, CHF exacerbation, RAS, other. Patient PMH that increases complexity of ED encounter:  CHF, hypertension, hyperlipidemia, diabetes, asthma  Interpretation of Diagnostics - I independent reviewed and interpreted the labs as followed: Cr slightly elevated above baseline  - I independently visualized the following imaging with scope of interpretation limited to determining acute  life threatening conditions related to emergency care: MRI brain and CT head non con, which revealed no acute abnormalities, cxr did reveal interval enlargement of the cardiac silhouette but otherwise no acute findings  -I personally reviewed and interpreted EKG which revealed sinus rhythm  Patient Reassessment and Ultimate Disposition/Management On reassessment, patient remained without vertiginous symptoms, chest pain and stated she was not actively having any visual disturbance.  Workup overall was reassuring.  Does not suggest stroke or hypertensive emergency.  Her blood pressure improved without intervention but I did give her her home dose of amlodipine  as she missed it this morning.  Discussed return precautions but advised to follow-up with her PCP and continue to take her BP medications as prescribed.  Discharged in good condition.  Patient management required discussion with the following services or consulting groups:  None  Complexity of Problems Addressed Acute complicated illness or Injury  Additional Data Reviewed and Analyzed Further history obtained from: Past medical history and medications listed in the EMR and Prior ED visit notes  Patient Encounter Risk Assessment Consideration of hospitalization         Final Clinical Impression(s) / ED Diagnoses Final diagnoses:  Hypertension, unspecified type    Rx / DC Orders ED Discharge Orders     None         Rishik Tubby K, PA-C 01/05/24 1304    Albertus Hughs, DO 01/10/24 1502

## 2024-01-05 NOTE — ED Triage Notes (Signed)
 Pt BIB [facility] from home c/o dizziness upon awakening. Hypertensive with EMS. Hx of vertigo  PTA Bp- 184/94 CBG- 94

## 2024-01-05 NOTE — Discharge Instructions (Addendum)
 Evaluation today was overall reassuring.  Your blood pressure was notably elevated during this encounter but did improve without medicine.  I did give you your home dose of amlodipine .  Your MRI and CT scan were reassuring along with your chest x-ray.  Please follow-up with your PCP.  If you develop chest pain, visual disturbance or headache or dizzy nests or other please return to the emergency department for further evaluation.

## 2024-01-05 NOTE — ED Notes (Signed)
 Urine sent to lab to be held

## 2024-01-05 NOTE — ED Notes (Signed)
 Called spouse he will be here in 15 minutes approximately.

## 2024-01-05 NOTE — ED Notes (Signed)
 Patient transported to MRI

## 2024-01-12 DIAGNOSIS — F319 Bipolar disorder, unspecified: Secondary | ICD-10-CM | POA: Diagnosis not present

## 2024-01-12 DIAGNOSIS — R55 Syncope and collapse: Secondary | ICD-10-CM | POA: Diagnosis not present

## 2024-01-12 DIAGNOSIS — R42 Dizziness and giddiness: Secondary | ICD-10-CM | POA: Diagnosis not present

## 2024-01-19 ENCOUNTER — Encounter: Payer: Self-pay | Admitting: Psychiatry

## 2024-01-19 ENCOUNTER — Ambulatory Visit: Admitting: Psychiatry

## 2024-01-19 DIAGNOSIS — R7989 Other specified abnormal findings of blood chemistry: Secondary | ICD-10-CM

## 2024-01-19 DIAGNOSIS — F411 Generalized anxiety disorder: Secondary | ICD-10-CM | POA: Diagnosis not present

## 2024-01-19 DIAGNOSIS — G3184 Mild cognitive impairment, so stated: Secondary | ICD-10-CM | POA: Diagnosis not present

## 2024-01-19 DIAGNOSIS — F5105 Insomnia due to other mental disorder: Secondary | ICD-10-CM | POA: Diagnosis not present

## 2024-01-19 DIAGNOSIS — F4001 Agoraphobia with panic disorder: Secondary | ICD-10-CM

## 2024-01-19 DIAGNOSIS — F3164 Bipolar disorder, current episode mixed, severe, with psychotic features: Secondary | ICD-10-CM | POA: Diagnosis not present

## 2024-01-19 MED ORDER — OLANZAPINE 10 MG PO TABS: 10.0000 mg | ORAL_TABLET | Freq: Every evening | ORAL | 0 refills | Status: DC

## 2024-01-19 MED ORDER — QUETIAPINE FUMARATE 200 MG PO TABS: 100.0000 mg | ORAL_TABLET | Freq: Every day | ORAL | Status: DC

## 2024-01-19 NOTE — Progress Notes (Addendum)
 [name] [id number] 07-Mar-1951 73 y.o.   Video Visit via My Chart  I connected with pt by video using My Chart and verified that I am speaking with the correct person using two identifiers.   I discussed the limitations, risks, security and privacy concerns of performing an evaluation and management service by My Chart  and the availability of in person appointments. I also discussed with the patient that there may be a patient responsible charge related to this service. The patient expressed understanding and agreed to proceed.  I discussed the assessment and treatment plan with the patient. The patient was provided an opportunity to ask questions and all were answered. The patient agreed with the plan and demonstrated an understanding of the instructions.   The patient was advised to call back or seek an in-person evaluation if the symptoms worsen or if the condition fails to improve as anticipated.  I provided 30 minutes of video time during this encounter.  The patient was located at home and the provider was located office. Session 1130-1200  Subjective:   Patient ID:  [name] is a 73 y.o. (DOB 09-Mar-1951) female.  Chief Complaint:  Chief Complaint  Patient presents with   Follow-up   Depression   Anxiety   Hallucinations    [name] presents to the office today for follow-up of anxiety and depression and poor cognition.  At her visit October 12, 2018.  Because of her poor cognition which did seem to get even worse lately with her memory and given a negative unremarkable medical work-up by her primary care doctor the decision was made to change her from Xanax  which she has been on for years to lorazepam .  Lorazepam  often has less cognitive side effects than does alprazolam .  She had a lot of difficulty with headaches and insomnia with the transition.  She called several times in that transition.  Her husband reported that her cognition was better after the  transition however.  She was satisfied with the Ativan .  There was an attempt to reduce her lamotrigine  to 150 mg also in hopes of improving cognition but it was uncertain at the last visit where there she had actually done so.  visit December 2020 without med changes.  She had continued to do cognitively better off alprazolam  and on lorazepam .  seen March 2021.   Better than December visit.  Feeling good and less down.  Adjusting time of med helped excessively sleep.  Brief hypomanic episode resolved where she cleaned all night.  Sleep 10 hours . No med changes.  12/23/19 appt.  Noted: More depression, crying and racing thoughts and distressed to the point of wondering if needed the hospital.  Sleep got worse and needed Seroquel  IR 200 also bc went 2 days without sleep.  It worked and helped sleep.  Worry a lot and melancholy this week.  Cries over what happens dealing with her daugher.[name] has prostate and kidney cancer again.  Had surgery June 10, 2019 and she's cried a lot.  He's incontinent and very uncomfortable.  He usually has positive attitude.   Worry over his health and how she'll do if something happens to him.  She's afraid of Covid.  Trusting in God usually.  She realizes she  Needs to drive occasionally bc of his health.  [name]'s father had prostate CA and lived to 90 yo.  Uncle died of it.  Not markedly depressed. Can't walk much dT plantar fascitis. Also  stressed over [name] 71 yo not doing school work and causing problems.  Her father [name] is a bad parent and not helpful.  Stressed over [employer] and downs too and they have periodic conflict.   More down and anxious.   Guilt tendency.   Patient reports difficulty with sleep initiation or maintenance in part DT anxiety and racing thoughts.Intermittent sleep problems with days and nights mixed up.  Not napping. Not manic.[name] Denies appetite disturbance.  Patient reports that energy and motivation have been good.  Patient has some  difficulty with concentration.  Chronic memory problems.   Patient denies any suicidal ideation. Plan:   No changes in meds indicated except agree to take the extra Seroquel  200 IR HS for mixed manic sx that are worse right now.  May be having mixed seasonal sx.  02/20/2020 appointment with the following noted: Doing fairly good except chronic worry over D and Yolanda [name].  Some days cries a lot over it.  D having a hard time lately.  Still easily stressed but not more than usual. Stopped extra quetiapine  200 mg HS bc no longer needed it. Anxiety is chronic but at baseline as is depression and not manic now. H helps her take the meds.   Memory is still bad but has been worse when on Xanax  instead fo Ativan .  Sleep is fine from 10 to 9 which is much better than in the past.  Occ spells of staying up for 2 days and then needs to take quetiapine .   H [name] goes for FU soon for cancer check up. Plan: no med changes  05/21/20 appt with following noted: "A lot of problems".  Can't drive bc fear and anxiety.  Needed to drive H and couldn't. Fall off toilet twice. Dizzy and HA 2 days ago with a lot of crying and stress with daughter. Leaving home even coming here causes anxiety.    Cataract surgery twice. Fear of [name] being diagnosed with recurrent cancer. Chronic anxiety greather than depression.   Don't know what to do with daughter. Sleep, appetite and energy are OK. Tolerating meds. Sees [name] one day weekly. H helps with meds bc she's forgetful and easily confused.  Loses train of thoughts. Dog has separation anxiety and so they don't go out much.  Plan: no med changes  08/18/20 appt with following noted: 2 ER visitis with vertigo.  RX diazepam  and meclizine . Rarely takes former but takes latter regularly. Shakes so bad it made her cry and scream.  Started PT. Continues lorazepam  low dose 0.5 mg AM and 1.0 mg HS.  Still has dizziness and vertigo. Memory is still bad and H administers meds with  pill box. Chronic anxiety.  Depression manageable. Sleep OK and tolerates meds otherwise. [name] driving her crazy. Plan: no med changes  11/17/20 appt with following noted: Has had periods of vertigo and meds for it.  Then had manic sx with cleaning and couldn't sleep for 24 hours. Stopped quetiapine  200 and continued quetiapine  XR 800. Also started diazepam  5 mg BID for vertigo and continued lorazepam  for anxiety.  No meclizine  now.  Vertigo stopped and manic sx stopped.  Tolerating meds now. Still feels [name] is mean to her too often and she will cry over it. [name] in jail and is the father of Yolanda D [name] 67 yo.  Will be there for 3 mos. Not markedly depressed.  But hates herself some days.  Still memory problems about the same.  Sleeping OK now.  Periods of mood swings. Easily anxious with relatives visiting.   Plan: Mixed manic sx resolved for now. No med changes today.  02/17/2021 appointment with the following noted: Not good.  "A whole lof of everything."  Shaking for mos. It comes and goes.  Shaking at times makes her think she'll have a nervous breakdown.   Went to ER and dx vertigo.  Has tried meclizine  and Valium  but scared to take it.  Big shakes again today.  Memory is not good. Sleep to escape anxiety.  Scared to leave home. Not sure what brings on the spells of shaking. Today sx hit her as soon as she awoke.  Plan: No med changes  03/12/2021 phone call: Patient with a lot of anxiety.  Husband reported the patient had been hallucinating and was paranoid that he had left her.  Some confusion over identity of people that were around her.  It was suggested that she have medical work-up because it sounded like she had delirium and may have a UTI causing that. 03/17/2021 psychiatric hospitalization. 03/22/2021 MD response:  Note Patient has been very unstable with bipolar disorder mixed symptoms lately also with some delirium of unknown cause.  She will be seeing her  primary care physician tomorrow.  Due to the severity of her symptoms it is suggested that she add haloperidol  2 mg nightly to her current quetiapine  800 mg nightly since we cannot increase the quetiapine  dose further.  This may help with her confusion and agitation.  It is okay to talk with her husband because he administers her medication and because she is too confused to understand the instructions.     04/16/21 H called with following:  Pt.'s husband called.  She is currently in [facility] waiting for an inpatient pyschiatric bed.  Pt advised her husband that Dr Toi Foster told her to stop taking the Lorazepam .  He said he is not aware of that chang in her prescription.  He would like someone to call him and advise if there were any changes to her meds so he stays aware and can let the hospital know if needed. Husband was informed we had not made any medication changes and would not have suggested she abruptly stopped lorazepam  which could cause withdrawal and other kinds of problems.  05/11/2021 appointment with the following noted:  spoke with H for info At hospital was taken off Ativan , buspirone , and Seroquel  reduced to 500 mg HS (as 1 of XR 400 mg  and 1/2 of 200 mg IR quetiapine ).  Had sedative SE at 200 mg queiapinte !R.  Off risperidone  and haloperidol .   Hospitalized at [facility] for 9 days.  They didn't feel communication was good with them. But she is better now than she was.  Had been psychotic PTH.  Last 2-3 days getting more hyper and can't wind down and hard time going to sleep.  Came home 05/03/21. She didn't like the restrictions at the hospital.  No panic lately and wants something to help her sleep. Plan; Mixed manic sx including psychosis recently but not psychotic now but insomnia.. Will get more psychotic if she doesn't sleep but apparently didn't tolerate Seroquel  IR 200 mg HS well so will increase to quetiapine  IR 150 mg HS and XR 400 mg nightly. Continue  lamotrigine .    06/11/21 appt:  [facility]. H thinks she's doing goood. Doing very fine.  No trouble sleeping.  Sometimes in morning feels sad bc  she knows she will have racing thoughts, but it resolves after a little while.  Racing thoughts since hospital.  Worries over money chronically. To bed 1015-7:30.  Maybe longer which is normal. H administers meds.  Some wordfinding problems.  Does journal which helps. No SE with meds.  Yesterday was hard with anxiety but today is better.  On lamotrigine  150 BID, SERoquel  XR 400 + Seroquel  IR 150 mg HS. Plan: Much better but not fully resolved.  Mixed manic sx including psychosis recently but not psychotic but insomnia last visit resolved with the following... apparently didn't tolerate Seroquel  IR 200 mg HS well so increased quetiapine  IR 150 mg HS and XR 400 mg nightly and now she is sleeping but still having some racing thoughts. Continue lamotrigine  150 BID.    07/14/2021 appointment with the following noted: Bad day yesterday feeling very depressed without anxiety. Not sleeping well lately.  Thinks it's related to less Seroquel  than in the past.  Some EMA.  Always normal slept a lot. Compulsive cleaning and everything in it's place.  Arranges soap dispensers. Cleaning in the middle of the night. No recent psychotic sx but still racing thoughts and chronic $ worries. Still afraid to leave home.  09/20/2021 appt noted: Great overall.  Occ insomnia with EMA.  Still worry but tries not let it consume me.  Still agoraphobia and hates to go out to dentists and doctors.  Challenge to go to dentist.  Can still dread things. Brief nap in daytimes. No SE H administers meds. Seroquel  300 mg HS, XR 400 mg HS.  Lamotrigine  150 Patient reports stable mood and denies depressed or irritable moods.   Patient denies difficulty with sleep initiation or maintenance. Denies appetite disturbance.  Patient reports that energy and motivation have been good.  Patient  denies any difficulty with concentration.  Patient denies any suicidal ideation.  01/20/22 appt noted: also talked with [name] Doesn't like waiting 4 mos between appts. Was manic before increasing Seroquel  with irritability, not sleeping a couple of days and excessive cleaning. Seroquel  increased to IR 400 mg HS.  No SE I feel much Better now.  Doing better at time with anxiety.   Chronic worry over D and Yolanda. Yolanda with psych problems. Sleeping good again. Racing thoughts are better but not gone Tolerating meds.   No further changes desired.   Plan: Seroquel  IR 400 mg HS well and XR 400 mg nightly abc recent manic sx better with increase dose. Continue lamotrigine  150 mg twice daily  04/28/2022 appointment noted: Trouble sleeping without extra 100 mg quetiapine  HS and takes XR 400 mg and IR 300 mg at 730 pm.  Otherwise will be up until 3 AM.  Sleep from 11 until 830.  Good unless anxious. Doing fairly well except 2 weeks bad spell from July 31 related to [name] receiving call from woman he worked with 8 years ago and triggered fear ans suspiciousness and insecurity and overwhelmed her.  She cried and over reacted.  Rough 2 weeks but otherwise ok. Real worried about [name] who is self cutting.  Seeing therapist and psychiatrist.  Her father is terrible and inattentive.  She is 71 yo and never happy. I still have agoraphobia and doesn't want to go ut places.  Her dog also has severe anxiety and doesn't like to be alone too.  Still fearul about money.  08/03/22 appt noted: Doing pretty well overall.  Chronic worry over money.  Met old friend on Facebook.  She  would contact [name] a lot.  Was making her sad.  Would cry every time the woman called.  ,This was wearing her out bc woman was hyperverbal.  Cut the woman off.  Since stopped talking to her then no longer crying. When starts to get dark then racing thoughts.  Driving me crazy.  Chronic worry .  Chronic memorhy problems and tendency to be obsessed over  fear of dementia. Irritable and confused.  Might nap 30 min and rest her mind and awakens feeling better. Continues the same meds: lamotrigine  and quetiapine . Gained 20# last year. Plan: Have pushed Seroquel   as far as possible so start another mood stabilizer Trileptal  and increase to 10 mg AM and 300 mg pM  08/09/2022 phone call: She took oxcarbazepine  150 mg Friday and Saturday and developed severe abdominal pain, bowel and bladder incontinence, and impaired vision.  She skipped it on Sunday and symptoms resolved.  She took it again yesterday and the side effects returned.  States it did help with the racing thoughts but she is unable to tolerate it.  That was just on half of oxcarbazepine  tablet. MD response: DC oxcarbazepine  due to side effects.  10/17/22 appt noted: Started Monjauro today. Still racing thoughts and cleaning a lot.  More anxiety than depression.  Rough road.  Worries about everything.   Thoughts jump from one thing to another.  Can't get away from it.  Can't focus.  Hard to take instruction bc of this.    12/20/22 appt noted: Not good.  Kidney stone. More anxiety last couple of mos. Get something on her mind and she cannot get it off.  Obsesses on things that don't matter.  Trouble going to sleep.  Couldn't go into store the other day dT panic and agoraphobia.   Occ dep but nothing to worry about.  More panic.   Lost 15 # on Monjaro.   Plan start sertraline  for anxiety  03/03/23 appt noted: TC Been hosp for kidney px kidney stone.  Glad to be over it.  .   Lost 42 # since April 1 with Monjauro.  Eating better.   Anxiety is better with sertraline  50 mg marked benefit with very little anxiety.  Much happier and less crying.  Better acceptance and self talk better than ever before.   Still some dep which interferes with function at times but not as severe. I'm doing ok.   Tolerating psych meds without problems.  05/03/23 appt noted:  Psych med: lamotrigine  150 BID,  Quetiapine  ER 400 mg pm and IR 300-400 mg HS, sertraline  50 for anxiety. Real sleepy today after taking quetiapine  200 mg , more than usual.  Only takes it prn.  Usually getting enough sleep.  But sometimes keyed up and can't go to sleep.   Been feeling good overall.  Occ bad days.  For the most part doing fine.   Does "thrifting" which lifts her spirits about 1 time weekly.   It takes her mind off worries.   No marked dep and anxiety is manageable. Lost 55#.   Doing fine with [name].   [name] 73 yo.  Quiet girl. No med change desired.  07/05/23 appt noted:  In person  Had episode of anxiety with severe worry and obsessing over money at times and bad  episode a couple of weeks ago. Lost 63# on Monjaro.  SE hair thinning.  Brittle nails. Can report many potential SE.  Better mobility now.  "Very nice emotionally".  Now 212# and goal is 199#.   Blood sugar runs in 70's fasting.   No insulin .  Exercising too.   No sig dep.  I don't have dep like I did years ago but have bad anxiety.  Still can't drive with anxiety and memory px.   Sleep at night is better.  STM is "so bad".   H prepares meds.  Forgets how to do tasks at times.   Terrible anxiety.   Psych med: lamotrigine  150 BID, Quetiapine  ER 400 mg pm and IR 300-400 mg HS, sertraline  50 for anxiety. Uses routine to help with memory.   Plan: Trial low dose propranolol  10-20 mg BID prn anxiety bc it helped D's anxiety.    09/06/23 apppt noted: Psych med: lamotrigine  150 BID, Quetiapine  ER 400 mg pm and IR 300-400 mg HS, sertraline  50 for anxiety. Tried propranolol  for anxiety up to 20 BID.  It does help some.  Will get stubborn and not take it.   Had a lot anxiety and trouble sleeping.  When bad I clean like the devil.  Only 1 hour sleep last night.    Hard to learn new things since childhood like technology.  Can't use tablet without his help.   Last night took extra 100 mg Seroquel  and didn't help. No trigger for more anxiety, tension,  scared.  Even real nervous before MD called.   Lost 62#  on Mounjaro.  Feels better physically.   Some people tell her she has dementia.    Worried Seroquel  might cause that.  No clear pcpt bc long term memory problems.  Can't deal with thinking I might have dementia.   Chronic anxiety since childhood.   Has a friend who calls and a lot and chronically is negative and complains all the time.  Sleep worse for 2-3 weeks.   Plan: DC sertraline  and start mirtazapine  15 mg HS for sleep and anxiety  11/15/23 appt noted:  H [name] on phone call Psych med: lamotrigine  150 BID, Quetiapine  ER 400 mg pm and IR 300-400 mg HS, mirtazapine  15 HS Tried propranolol  for anxiety up to 20 BID.  Having dep and anxiety bad.  Sleeping is terrrible.  Went up to 3 days without sleep.  About 2 weeks. No trigger.  Cannot go to sTrying to adapt to it and tolerate it.   Trouble focusing and memory issues worse.  Gets easily overwhelmed and stimulated.  Will get lost in stores and large areas. H notices.  Episodic cleaning episodes over the top.   Loses train of thought Plan: Stop  mirtazapine  DT NR Increase Seroquel  XR 400 mg and add additional IR Seroquel  500 mg HS  12/22/23 APPT NOTED: with [name] on phone call Med: lamotrigine  150 BID, Quetiapine  ER 400 mg pm and IR 500 mg HS,  Very well .  The med change helped.  Sleep is better with some brief awakening.   The med change very helpful.  Usually asleep in 45 mins.   No SE other than sleepiness.  But pleased with med changes. Not dep.  Not as anxious.  Cooking again and wasn't before.    01/19/24 urgent appt: [name] on call Pt called yesterday complaining of increased distress and hallucinations and paranoia.  Med: lamotrigine  150 BID, Quetiapine  ER 400 mg pm and IR 500 mg HS,  [name] said I was imagining things.  I felt like I was having delusions.  Was emotional yesterday.  Lost 9# in a couple of weeks bc  couldn't eat.  Has been negative real bad.   [name] reports she  thought he was talking to people on the phone.   Was in ER after fall on 01/12/24.  Passed out with dizziness and N and couldn't eat.  Had MRI an CT scan.   Stressed from $ expense.  [name] fell a week before that.  Worried over how we are going to make it; worrying about [name].  He has leg wound with cellulitis.  Not getting better but is seeing doctor. Drinks plenty of fluids, but only Sprite Zero.   Dizziness resolved off ezetimibe . General fearfulness yesterday, I couldn't stop it.   Complains of seeing things that aren't actually happening; that he's on the phone to other women.  Wake during the night thinking about it.  Can't eat bc can't get that fear out of my mind.  Then says sleeping good.  "The fear is so bad."  Had a spell like this in 1983.    Prior psychiatric medication trials include  paroxetine , Lexapro, sertraline  50 Sertraline  50 daily.    risperidone  4 mg which was sedating, aripiprazole,  perphenazine,  Seroquel  1000,  olanzapine 5 mg 1 week SE drooling Lamotrigine  300.    lithium which was not tolerated even at very low dosages.  Oxcarbazepine  150 mg bowel and bladder incontinence and impaired vision. Topamax for anxiety,  She was intolerant of both Namenda and Aricept.    Xanax , Ativan , diazepam  5 mg BID   This is not an exhaustive list.   Psych hospitalization 04/2021 at [facility] for psychosis.  At the visit in February 2020 Mellony was more acutely confused recently for no clear reason.   She had a negative medical work-up for causes of short-term memory problems and confusion.  Therefore the decision was made to try switching her from alprazolam  to lorazepam  at the lowest possible dose in hopes of improving her cognition.  This was successful and her cognition was improved significantly and her husband verified this.    Yolanda [name] seeing Dr. Dominica Friends D noted sig anxiety benefit with propranolol   Neuropsych testing mid 2019 at [facility] but results not on chart.     Review of Systems:  Review of Systems  Eyes:  Positive for visual disturbance.  Cardiovascular:  Negative for chest pain and palpitations.  Musculoskeletal:  Positive for arthralgias, back pain and gait problem.  Neurological:  Positive for dizziness. Negative for weakness.  Psychiatric/Behavioral:  Positive for confusion and decreased concentration. Negative for agitation, behavioral problems, dysphoric mood, hallucinations, self-injury, sleep disturbance and suicidal ideas. The patient is nervous/anxious. The patient is not hyperactive.     Medications: I have reviewed the patient's current medications.  Current Outpatient Medications  Medication Sig Dispense Refill   acetaminophen  (TYLENOL ) 325 MG tablet Take 650 mg by mouth every 6 (six) hours as needed for mild pain or headache.     amLODipine  (NORVASC ) 5 MG tablet Take 5 mg by mouth every evening.     aspirin  81 MG tablet Take 81 mg by mouth daily.     ezetimibe  (ZETIA ) 10 MG tablet Take 1 tablet (10 mg total) by mouth daily. 90 tablet 3   famotidine  (PEPCID ) 20 MG tablet Take 20 mg by mouth in the morning and at bedtime.     furosemide  (LASIX ) 20 MG tablet Take 20 mg by mouth daily as needed (for severe swelling).     lamoTRIgine  (LAMICTAL ) 150 MG tablet TAKE 1 TABLET TWICE DAILY 180 tablet 3  levocetirizine (XYZAL ) 5 MG tablet Take 5 mg by mouth every evening.     levothyroxine  (SYNTHROID ) 100 MCG tablet Take 100 mcg by mouth daily before breakfast.     lisinopril  (ZESTRIL ) 20 MG tablet Take 1 tablet (20 mg total) by mouth daily.     metoprolol  succinate (TOPROL -XL) 50 MG 24 hr tablet Take 50 mg by mouth daily.     Multiple Vitamin (MULTIVITAMIN) tablet Take 1 tablet by mouth daily.     OLANZapine (ZYPREXA) 10 MG tablet Take 1 tablet (10 mg total) by mouth every evening. 30 tablet 0   polyethylene glycol (MIRALAX  / GLYCOLAX ) packet Take 17 g by mouth 2 (two) times daily as needed for mild constipation.     potassium chloride  SA  (K-DUR,KLOR-CON ) 20 MEQ tablet Take 20 mEq by mouth daily.     propranolol  (INDERAL ) 10 MG tablet TAKE 1 TO 2 TABLETS BY MOUTH TWICE A DAY AS NEEDED FOR ANXIETY 360 tablet 0   QUEtiapine  (SEROQUEL  XR) 400 MG 24 hr tablet TAKE 1 TABLET AT 7:30PM 90 tablet 0   simvastatin  (ZOCOR ) 40 MG tablet Take 1 tablet (40 mg total) by mouth every evening. 30 tablet 0   tirzepatide (MOUNJARO) 12.5 MG/0.5ML Pen Inject 12.5 mg into the skin once a week.     QUEtiapine  (SEROQUEL ) 200 MG tablet Take 0.5 tablets (100 mg total) by mouth at bedtime.     No current facility-administered medications for this visit.    Medication Side Effects: None  Allergies:  Allergies  Allergen Reactions   Maxzide [Triamterene-Hctz] Other (See Comments)    Hurt patient's kidneys   Celexa [Citalopram Hydrobromide] Nausea Only   Metformin And Related Nausea And Vomiting   Other Nausea And Vomiting and Other (See Comments)    Unnamed medication hurt the patient's kidneys   Reglan  [Metoclopramide ] Nausea Only   Risperdal  [Risperidone ] Nausea Only and Other (See Comments)    Also caused hallucinations   Trazodone And Nefazodone Nausea Only   Zestril  [Lisinopril ] Nausea Only   Lithium Palpitations and Other (See Comments)    Shakes and delusional pt started seeing things      Past Medical History:  Diagnosis Date   Anxiety    Arthritis    Asthma    Bipolar 1 disorder (HCC)    CHF (congestive heart failure) (HCC)    Complication of anesthesia    left a bad taste in my mouth it has lasted since january    Depression    Diabetes mellitus    Hernia    umbilical   Hyperlipidemia    Hypertension    Hypothyroidism    Pericarditis, viral 2010 October   Sleep apnea    uses cpap setting of 17    Family History  Problem Relation Age of Onset   Diabetes Mother    Hypertension Mother    Hyperlipidemia Mother    Diabetes Father    Hypertension Father    Hyperlipidemia Father    Hypertension Sister     Social  History   Socioeconomic History   Marital status: Married    Spouse name: Not on file   Number of children: Not on file   Years of education: Not on file   Highest education level: Not on file  Occupational History   Not on file  Tobacco Use   Smoking status: Never   Smokeless tobacco: Never  Substance and Sexual Activity   Alcohol use: No   Drug use: No  Sexual activity: Not on file  Other Topics Concern   Not on file  Social History Narrative   Not on file   Social Drivers of Health   Financial Resource Strain: Not on file  Food Insecurity: No Food Insecurity (12/22/2022)   Hunger Vital Sign    Worried About Running Out of Food in the Last Year: Never true    Ran Out of Food in the Last Year: Never true  Transportation Needs: No Transportation Needs (12/22/2022)   PRAPARE - Administrator, Civil Service (Medical): No    Lack of Transportation (Non-Medical): No  Physical Activity: Not on file  Stress: Not on file  Social Connections: Not on file  Intimate Partner Violence: Not At Risk (12/22/2022)   Humiliation, Afraid, Rape, and Kick questionnaire    Fear of Current or Ex-Partner: No    Emotionally Abused: No    Physically Abused: No    Sexually Abused: No    Past Medical History, Surgical history, Social history, and Family history were reviewed and updated as appropriate.   Please see review of systems for further details on the patient's review from today.   Objective:   Physical Exam:  There were no vitals taken for this visit.  Physical Exam Neurological:     Mental Status: She is alert and oriented to person, place, and time.     Cranial Nerves: No dysarthria.  Psychiatric:        Attention and Perception: Perception normal. She is inattentive.        Mood and Affect: Mood is anxious. Mood is not depressed. Affect is tearful. Affect is not labile.        Speech: Speech is not rapid and pressured or slurred.        Behavior: Behavior is  cooperative.        Thought Content: Thought content is paranoid and delusional. Thought content does not include homicidal or suicidal ideation. Thought content does not include suicidal plan.        Cognition and Memory: Cognition normal. Cognition is not impaired. She exhibits impaired recent memory.        Judgment: Judgment normal.     Comments: Insight intact Talkative.       Lab Review:     Component Value Date/Time   NA 137 01/05/2024 0728   K 3.6 01/05/2024 0728   CL 108 01/05/2024 0728   CO2 23 01/05/2024 0728   GLUCOSE 94 01/05/2024 0728   GLUCOSE 154 (H) 09/01/2006 1100   BUN 19 01/05/2024 0728   CREATININE 1.07 (H) 01/05/2024 0728   CREATININE 0.93 07/19/2011 1110   CALCIUM 9.9 01/05/2024 0728   PROT 6.8 12/22/2022 0300   ALBUMIN 3.6 12/22/2022 0300   AST 19 12/22/2022 0300   ALT 15 12/22/2022 0300   ALKPHOS 76 12/22/2022 0300   BILITOT 0.7 12/22/2022 0300   GFRNONAA 56 (L) 01/05/2024 0728   GFRAA >60 03/23/2016 1933       Component Value Date/Time   WBC 7.0 01/05/2024 0728   RBC 4.02 01/05/2024 0728   HGB 12.7 01/05/2024 0728   HCT 38.7 01/05/2024 0728   PLT 172 01/05/2024 0728   MCV 96.3 01/05/2024 0728   MCH 31.6 01/05/2024 0728   MCHC 32.8 01/05/2024 0728   RDW 13.6 01/05/2024 0728   LYMPHSABS 1.2 04/15/2021 1831   MONOABS 0.4 04/15/2021 1831   EOSABS 0.1 04/15/2021 1831   BASOSABS 0.1 04/15/2021 1831   Prior lamotrigine   level in 2018 on this dosage was 3.6.  Not particularly high.  Per Dr. Scherrie Curt: Clinical Impressions: Cognitive complaints are likely due to underlying psychiatric disorder (bipolar disorder/anxiety disorder with racing thoughts). Diagnostic impressions based on test performances are limited due to the patient's severe inability to fully attend to the tasks. However, based on clinical presentation, I highly doubt the presence of a neurodegenerative dementia. It is much more likely that her racing thoughts and psychiatric disorders  are interfering with cognitive   No results found for: "POCLITH", "LITHIUM"   No results found for: "PHENYTOIN", "PHENOBARB", "VALPROATE", "CBMZ"   .res Assessment: Plan:    Bipolar affective disorder, mixed, severe, with psychotic behavior (HCC) - Plan: QUEtiapine  (SEROQUEL ) 200 MG tablet, OLANZapine (ZYPREXA) 10 MG tablet  Generalized anxiety disorder  Panic disorder with agoraphobia  Insomnia due to mental condition  Mild cognitive impairment  Low vitamin D  level   Leola Raisin has chronic severe anxiety with panic attacks as well as bipolar disorder with a history of significant lability.  She has had more severe instability with psychotic sx and cognitive problems off and on lately.   Sx mixed bipolar and anxiety with insomnia had resolved with increase Seroquel , but unde rmore stress is severely anxious and paranoid and hallucinating again.  She also has mild cognitive impairment as a complicating factor.  She needs her husband's assistance to manage her medications.    Consider switch to Caplyta or Saphris bc lower SE and may help anxiety but probably would not be covered by insurance. Reviewed old chart re: med options.    Discussed potential metabolic side effects associated with atypical antipsychotics, as well as potential risk for movement side effects. Advised pt to contact office if movement side effects occur.  Disc family's fear of LT SE.  However current meds work and without them sx unmanageable.  Has been able to stop BZ   Continue MVI.    Pt is chronically needy and requires extended appts DT chronic anxiety and easily stressed but much better with sertraline  low dose for anxiety. Cannot drive now and wasn't much before.    No evidence of medical cause for STM px.  FHX dementia including mother.   Pt doesn't feel able to fix her meds by herself.  Gives up quickly on task demands.    Continue lamotrigine  150 mg BID  Disc potential hospitalization for paranoia  and severe anxiety and difficulty eating.  Will try to prevent it by starting process of switching to olanzapine. Reduce quetiapine  to XR 400 mg pm with quetiapine  100 mg IR HS In it's place retry olanzapine 10 mg HS.  Will try to transition off quetiapine  but needs to be done gradually.  FU as sched.  If not better or if anything worse then call.    Nori Beat, MD, DFAPA   Future Appointments  Date Time Provider Department Center  02/20/2024  1:40 PM Knox Perl, MD [facility]  03/05/2024  3:00 PM Cottle, Kennedy Peabody., MD [facility] None  03/26/2024  2:45 PM Charity Conch, DPM [facility]  03/28/2024  1:30 PM [facility] DEXA 1 [facility]       No orders of the defined types were placed in this encounter.      -------------------------------

## 2024-01-23 ENCOUNTER — Telehealth: Payer: Self-pay

## 2024-01-23 NOTE — Telephone Encounter (Signed)
 Patient had a visit 5/30.    Reduce quetiapine  to XR 400 mg pm with quetiapine  100 mg IR HS In it's place retry olanzapine  10 mg HS.  Will try to transition off quetiapine  but needs to be done gradually.  Pt said she was told to call and update us  on how she was doing. Pt asked me to talk to her husband as she reported memory issues.   He said they are following the instructions above. He is reporting patient still having dizziness, trouble sleeping, hallucinating frequently, and she is stressed out.

## 2024-01-24 NOTE — Telephone Encounter (Signed)
 If she is not better then reduce quetiapine  XR to 1/2 of 400 mg tablet and reduce quetiapine  IR to 1/2 of 100 mg tablet at night and increase olanzapine  to 1 and 1/2 of 10 mg tablets about 2 hours before bedtime.

## 2024-01-24 NOTE — Telephone Encounter (Signed)
 Reviewed recommendations with husband. I also sent recommendations via MyChart.

## 2024-01-30 ENCOUNTER — Telehealth: Payer: Self-pay | Admitting: Psychiatry

## 2024-01-30 NOTE — Telephone Encounter (Signed)
 Pt's husband reporting she has increased anxiety. He is asking if she can increase the propranolol .  Currently taking 10 mg 1-2 tablets BID PRN.

## 2024-01-30 NOTE — Telephone Encounter (Signed)
 Ok to increase propranolol  to 30 mg twice daily, AM and late afternoon.

## 2024-01-30 NOTE — Telephone Encounter (Signed)
 I called pt to schedule an appt.  Husband answered the phone.  He is on DPR.  He wants to know if Dr Toi Foster will appove an increase of the Propranolol .  He said pt doesn't need a refill at the moment but would like to increase the dosage due to increased anxiety.  Next appt 7/3

## 2024-01-31 ENCOUNTER — Other Ambulatory Visit: Payer: Self-pay | Admitting: Psychiatry

## 2024-01-31 DIAGNOSIS — F3164 Bipolar disorder, current episode mixed, severe, with psychotic features: Secondary | ICD-10-CM

## 2024-01-31 NOTE — Telephone Encounter (Signed)
 Husband notified of propranolol  dosing.

## 2024-02-02 ENCOUNTER — Telehealth: Payer: Self-pay

## 2024-02-02 ENCOUNTER — Other Ambulatory Visit: Payer: Self-pay | Admitting: Psychiatry

## 2024-02-02 NOTE — Progress Notes (Signed)
 [facility]:  weaning quetiapine .  Don't refill this.

## 2024-02-02 NOTE — Telephone Encounter (Signed)
 [facility] Pharm[facility]:  weaning quetiapine .  Don't refill this from mail order. If she needs more to wean, we'll send in lower dose to local pharmacy.

## 2024-02-02 NOTE — Telephone Encounter (Signed)
 [facility] is trying to clarify Rx's for Quetiapine  XR 400 mg and Quetiapine  IR 200 mg and if she is additionally taking Olanzapine  10 mg for a 90 day filled 01/19/24.    Per last phone message this was noted quetiapine  XR to 1/2 of 400 mg tablet and reduce quetiapine  IR to 1/2 of 100 mg tablet at night and increase olanzapine  to 1 and 1/2 of 10 mg tablets about 2 hours before bedtime    The Rx dated 02/01/24 is for Quetiapine  200 mg 0.5 tablet at hs but the note has 1/2 of a 100 mg?

## 2024-02-04 ENCOUNTER — Other Ambulatory Visit: Payer: Self-pay | Admitting: Psychiatry

## 2024-02-04 DIAGNOSIS — F3164 Bipolar disorder, current episode mixed, severe, with psychotic features: Secondary | ICD-10-CM

## 2024-02-05 NOTE — Telephone Encounter (Signed)
 I think she increased to 15 mg HS.  Please call H to verify .  Make sure she is tolerating it.  If so increase to 15mg  tablet, 1 at night #30, no RF

## 2024-02-06 MED ORDER — OLANZAPINE 15 MG PO TABS: 15.0000 mg | ORAL_TABLET | Freq: Every day | ORAL | 0 refills | Status: DC

## 2024-02-06 NOTE — Telephone Encounter (Signed)
 Pt is taking 15 mg and tolerating it per husband.

## 2024-02-07 DIAGNOSIS — E1129 Type 2 diabetes mellitus with other diabetic kidney complication: Secondary | ICD-10-CM | POA: Diagnosis not present

## 2024-02-07 DIAGNOSIS — I129 Hypertensive chronic kidney disease with stage 1 through stage 4 chronic kidney disease, or unspecified chronic kidney disease: Secondary | ICD-10-CM | POA: Diagnosis not present

## 2024-02-07 DIAGNOSIS — I350 Nonrheumatic aortic (valve) stenosis: Secondary | ICD-10-CM | POA: Diagnosis not present

## 2024-02-07 DIAGNOSIS — N183 Chronic kidney disease, stage 3 unspecified: Secondary | ICD-10-CM | POA: Diagnosis not present

## 2024-02-12 DIAGNOSIS — M79605 Pain in left leg: Secondary | ICD-10-CM | POA: Diagnosis not present

## 2024-02-12 DIAGNOSIS — M79604 Pain in right leg: Secondary | ICD-10-CM | POA: Diagnosis not present

## 2024-02-14 DIAGNOSIS — E1129 Type 2 diabetes mellitus with other diabetic kidney complication: Secondary | ICD-10-CM | POA: Diagnosis not present

## 2024-02-14 DIAGNOSIS — R55 Syncope and collapse: Secondary | ICD-10-CM | POA: Diagnosis not present

## 2024-02-15 DIAGNOSIS — H43813 Vitreous degeneration, bilateral: Secondary | ICD-10-CM | POA: Diagnosis not present

## 2024-02-15 DIAGNOSIS — H52223 Regular astigmatism, bilateral: Secondary | ICD-10-CM | POA: Diagnosis not present

## 2024-02-15 DIAGNOSIS — H5203 Hypermetropia, bilateral: Secondary | ICD-10-CM | POA: Diagnosis not present

## 2024-02-15 DIAGNOSIS — H35373 Puckering of macula, bilateral: Secondary | ICD-10-CM | POA: Diagnosis not present

## 2024-02-15 DIAGNOSIS — E119 Type 2 diabetes mellitus without complications: Secondary | ICD-10-CM | POA: Diagnosis not present

## 2024-02-15 DIAGNOSIS — H35342 Macular cyst, hole, or pseudohole, left eye: Secondary | ICD-10-CM | POA: Diagnosis not present

## 2024-02-19 DIAGNOSIS — R32 Unspecified urinary incontinence: Secondary | ICD-10-CM | POA: Diagnosis not present

## 2024-02-19 DIAGNOSIS — R296 Repeated falls: Secondary | ICD-10-CM | POA: Diagnosis not present

## 2024-02-19 DIAGNOSIS — I350 Nonrheumatic aortic (valve) stenosis: Secondary | ICD-10-CM | POA: Diagnosis not present

## 2024-02-19 DIAGNOSIS — J45909 Unspecified asthma, uncomplicated: Secondary | ICD-10-CM | POA: Diagnosis not present

## 2024-02-19 DIAGNOSIS — I129 Hypertensive chronic kidney disease with stage 1 through stage 4 chronic kidney disease, or unspecified chronic kidney disease: Secondary | ICD-10-CM | POA: Diagnosis not present

## 2024-02-19 DIAGNOSIS — R413 Other amnesia: Secondary | ICD-10-CM | POA: Diagnosis not present

## 2024-02-19 DIAGNOSIS — E1129 Type 2 diabetes mellitus with other diabetic kidney complication: Secondary | ICD-10-CM | POA: Diagnosis not present

## 2024-02-19 DIAGNOSIS — N183 Chronic kidney disease, stage 3 unspecified: Secondary | ICD-10-CM | POA: Diagnosis not present

## 2024-02-19 DIAGNOSIS — F319 Bipolar disorder, unspecified: Secondary | ICD-10-CM | POA: Diagnosis not present

## 2024-02-20 ENCOUNTER — Encounter: Payer: Self-pay | Admitting: Physician Assistant

## 2024-02-20 ENCOUNTER — Ambulatory Visit: Attending: Cardiology | Admitting: Cardiology

## 2024-02-20 ENCOUNTER — Encounter: Payer: Self-pay | Admitting: Cardiology

## 2024-02-20 VITALS — BP 118/74 | HR 76 | Resp 16 | Ht 65.0 in | Wt 210.0 lb

## 2024-02-20 DIAGNOSIS — H8113 Benign paroxysmal vertigo, bilateral: Secondary | ICD-10-CM

## 2024-02-20 DIAGNOSIS — I350 Nonrheumatic aortic (valve) stenosis: Secondary | ICD-10-CM | POA: Diagnosis not present

## 2024-02-20 DIAGNOSIS — E7800 Pure hypercholesterolemia, unspecified: Secondary | ICD-10-CM | POA: Diagnosis not present

## 2024-02-20 DIAGNOSIS — I10 Essential (primary) hypertension: Secondary | ICD-10-CM

## 2024-02-20 NOTE — Progress Notes (Signed)
 Cardiology Office Note:  .   Date:  02/20/2024  ID:  [name], DOB 09/16/1950, MRN [id number] PCP: Leonel Cole, MD   [facility] Providers Cardiologist:  Gordy Bergamo, MD   History of Present Illness: .   [name] is a 73 y.o. Caucasian female patient with hypertension, hypercholesterolemia, morbid obesity with OSA not using CPAP, diabetes mellitus type 2 with stage IIIa chronic kidney disease who was seen by me on 11/28/2023 for precordial pain, symptoms with both typical and atypical for angina pectoris.  In view of her underlying risk factors, she underwent myocardial perfusion imaging scan on 12/01/2023 which revealed no evidence of ischemia with normal LVEF considered to be low risk.  Previously she has had an echocardiogram on 07/26/2023 revealing normal LVEF with mild to moderate aortic stenosis and mild aortic regurgitation.  She has well-controlled lipids as of 10/31/2023 hide mildly reduced EGFR at 59 mL.  She now presents for follow-up.  Discussed the use of AI scribe software for clinical note transcription with the patient, who gave verbal consent to proceed.  History of Present Illness [name] is a 73 year old female with a history of high blood pressure who presents with dizziness and vertigo and follow up of chest pain. She is accompanied by her husband.  She experiences dizziness and vertigo, particularly when changing positions such as standing up or turning her head. The dizziness is described as a sensation of the room spinning and is worse when getting up from a chair or bed. She uses a walker for stability during severe episodes. On Jan 05, 2024, she had a syncopal episode while walking, witnessed by her husband, and was taken to the emergency room where her blood pressure was high.  She experiences intense leg pain, particularly when walking from the parking lot to the clinic, and took four doses of Tylenol  the previous night for pain management. No  recent chest pain or heart palpitations.  Labs   External Labs:  Previously reviewed 10/31/2023, normal lipids, EGFR 49 mL, normal CBC.  ROS  Review of Systems  Cardiovascular:  Negative for chest pain, dyspnea on exertion and leg swelling.  Neurological:  Positive for disturbances in coordination and dizziness.    Physical Exam:   VS:  BP 118/74 (BP Location: Left Arm, Patient Position: Sitting, Cuff Size: Large)   Pulse 76   Resp 16   Ht 5' 5 (1.651 m)   Wt 210 lb (95.3 kg)   SpO2 98%   BMI 34.95 kg/m    Wt Readings from Last 3 Encounters:  02/20/24 210 lb (95.3 kg)  12/01/23 217 lb (98.4 kg)  11/28/23 217 lb 9.6 oz (98.7 kg)    Physical Exam Neck:     Vascular: Carotid bruit (soft aortic valve sclerotic murmur conducted) present. No JVD.  Cardiovascular:     Rate and Rhythm: Normal rate and regular rhythm.     Heart sounds: S1 normal and S2 normal. Murmur heard.     Early systolic murmur is present with a grade of 2/6 at the upper right sternal border.     No gallop.  Pulmonary:     Effort: Pulmonary effort is normal.     Breath sounds: Normal breath sounds.  Abdominal:     General: Bowel sounds are normal.     Palpations: Abdomen is soft.  Musculoskeletal:     Right lower leg: No edema.     Left lower leg: No edema.  Studies Reviewed: .    Echocardiogram 07/26/2023: Normal LVEF, mild to moderate aortic stenosis with mean gradient of 13 and peak gradient of 25 mmHg valve area of 1.84 cm, mild aortic regurgitation. No significant change from 12/23/2022  Lexiscan  nuclear stress test 12/01/2023: Normal perfusion without ischemia.  Normal LV systolic function.  Low risk.  Carotid artery duplex 12/23/2022: Bilateral ICA smooth with mild heterogenous plaque without stenosis.  EKG:         Medications ordered    No orders of the defined types were placed in this encounter.    ASSESSMENT AND PLAN: .      ICD-10-CM   1. Nonrheumatic aortic valve stenosis   I35.0     2. Primary hypertension  I10     3. Hypercholesteremia  E78.00     4. Benign paroxysmal positional vertigo due to bilateral vestibular disorder  H81.13      Assessment & Plan  Previously seen by me for evaluation of precordial pain, hard to differentiate between angina and nonanginal pain.  In view of hypertension, hypercholesterolemia, diabetes mellitus underwent stress testing which is low risk and nonischemic fortunately also has not had any recurrence of chest discomfort.  Continue present medical management.  Benign Paroxysmal Positional Vertigo Suspected due to dizziness and vertigo triggered by changes in head position, with symptoms including intense vertigo and room spinning sensations, likely related to inner ear issues. - Consider refer to ENT for evaluation and management of vertigo/BPH.  Dizziness/syncope/vertigo Episode on Jan 05, 2024, characterized by brief loss of consciousness and fall, associated with dizziness and hypertension. CT and MRI of the brain were unremarkable. No bladder or bowel incontinence reported.  Initially thought her symptoms were related to low blood pressure and orthostasis however trying to make the patient lay down in the bed led to severe vertigo and similarly when I met her to get back up as I did not need orthostasis, patient had significant vertigo as well.  She clearly has benign positional vertigo even with head movements.  Symptoms are severe and may need further evaluation.  Hypertension Hypertension was elevated during the syncope episode but is currently well-controlled with normal blood pressure during recent visits.  Office visit on a as needed basis.  Reviewed her stress test and echocardiogram and carotid artery duplex.  Continue primary prevention. Signed,  Gordy Bergamo, MD, [facility] 02/20/2024, 9:52 PM [facility] [address] [place], [place] [address] Phone: [phone]. Fax:  [phone]

## 2024-02-20 NOTE — Patient Instructions (Signed)

## 2024-02-22 ENCOUNTER — Encounter: Payer: Self-pay | Admitting: Psychiatry

## 2024-02-22 ENCOUNTER — Ambulatory Visit: Admitting: Psychiatry

## 2024-02-22 DIAGNOSIS — F5105 Insomnia due to other mental disorder: Secondary | ICD-10-CM | POA: Diagnosis not present

## 2024-02-22 DIAGNOSIS — G3184 Mild cognitive impairment, so stated: Secondary | ICD-10-CM

## 2024-02-22 DIAGNOSIS — F3164 Bipolar disorder, current episode mixed, severe, with psychotic features: Secondary | ICD-10-CM | POA: Diagnosis not present

## 2024-02-22 DIAGNOSIS — F4001 Agoraphobia with panic disorder: Secondary | ICD-10-CM

## 2024-02-22 DIAGNOSIS — F411 Generalized anxiety disorder: Secondary | ICD-10-CM

## 2024-02-22 DIAGNOSIS — R7989 Other specified abnormal findings of blood chemistry: Secondary | ICD-10-CM | POA: Diagnosis not present

## 2024-02-22 DIAGNOSIS — F40240 Claustrophobia: Secondary | ICD-10-CM | POA: Diagnosis not present

## 2024-02-22 MED ORDER — QUETIAPINE FUMARATE 50 MG PO TABS: 50.0000 mg | ORAL_TABLET | Freq: Every day | ORAL | 1 refills | Status: DC

## 2024-02-22 MED ORDER — OLANZAPINE 15 MG PO TABS: 15.0000 mg | ORAL_TABLET | Freq: Every day | ORAL | 1 refills | Status: DC

## 2024-02-22 NOTE — Progress Notes (Signed)
 [name] [id number] 19-Mar-1951 73 y.o.   Video Visit via My Chart  I connected with pt by video using My Chart and verified that I am speaking with the correct person using two identifiers.   I discussed the limitations, risks, security and privacy concerns of performing an evaluation and management service by My Chart  and the availability of in person appointments. I also discussed with the patient that there may be a patient responsible charge related to this service. The patient expressed understanding and agreed to proceed.  I discussed the assessment and treatment plan with the patient. The patient was provided an opportunity to ask questions and all were answered. The patient agreed with the plan and demonstrated an understanding of the instructions.   The patient was advised to call back or seek an in-person evaluation if the symptoms worsen or if the condition fails to improve as anticipated.  I provided 30 minutes of video time during this encounter.  The patient was located at home and the provider was located office. Session 200-230  Subjective:   Patient ID:  [name] is a 73 y.o. (DOB [birth year]) female.  Chief Complaint:  Chief Complaint  Patient presents with   Follow-up   Depression   Anxiety   Manic Behavior    [name] presents to the office today for follow-up of anxiety and depression and poor cognition.  At her visit October 12, 2018.  Because of her poor cognition which did seem to get even worse lately with her memory and given a negative unremarkable medical work-up by her primary care doctor the decision was made to change her from Xanax  which she has been on for years to lorazepam .  Lorazepam  often has less cognitive side effects than does alprazolam .  She had a lot of difficulty with headaches and insomnia with the transition.  She called several times in that transition.  Her husband reported that her cognition was better after the  transition however.  She was satisfied with the Ativan .  There was an attempt to reduce her lamotrigine  to 150 mg also in hopes of improving cognition but it was uncertain at the last visit where there she had actually done so.  visit December 2020 without med changes.  She had continued to do cognitively better off alprazolam  and on lorazepam .  seen March 2021.   Better than December visit.  Feeling good and less down.  Adjusting time of med helped excessively sleep.  Brief hypomanic episode resolved where she cleaned all night.  Sleep 10 hours . No med changes.  12/23/19 appt.  Noted: More depression, crying and racing thoughts and distressed to the point of wondering if needed the hospital.  Sleep got worse and needed Seroquel  IR 200 also bc went 2 days without sleep.  It worked and helped sleep.  Worry a lot and melancholy this week.  Cries over what happens dealing with her daugher.[name] has prostate and kidney cancer again.  Had surgery June 10, 2019 and she's cried a lot.  He's incontinent and very uncomfortable.  He usually has positive attitude.   Worry over his health and how she'll do if something happens to him.  She's afraid of Covid.  Trusting in God usually.  She realizes she  Needs to drive occasionally bc of his health.  [name]'s father had prostate CA and lived to 90 yo.  Uncle died of it.  Not markedly depressed. Can't walk much dT plantar fascitis.  Also stressed over [name] 71 yo not doing school work and causing problems.  Her father [name] is a bad parent and not helpful.  Stressed over [name] and downs too and they have periodic conflict.   More down and anxious.   Guilt tendency.   Patient reports difficulty with sleep initiation or maintenance in part DT anxiety and racing thoughts.Intermittent sleep problems with days and nights mixed up.  Not napping. Not manic.Yolanda Denies appetite disturbance.  Patient reports that energy and motivation have been good.  Patient has some  difficulty with concentration.  Chronic memory problems.   Patient denies any suicidal ideation. Plan:   No changes in meds indicated except agree to take the extra Seroquel  200 IR HS for mixed manic sx that are worse right now.  May be having mixed seasonal sx.  02/20/2020 appointment with the following noted: Doing fairly good except chronic worry over [name] and Yolanda [name].  Some days cries a lot over it.  [name] having a hard time lately.  Still easily stressed but not more than usual. Stopped extra quetiapine  200 mg HS bc no longer needed it. Anxiety is chronic but at baseline as is depression and not manic now. H helps her take the meds.   Memory is still bad but has been worse when on Xanax  instead fo Ativan .  Sleep is fine from 10 to 9 which is much better than in the past.  Occ spells of staying up for 2 days and then needs to take quetiapine .   H [name] goes for FU soon for cancer check up. Plan: no med changes  05/21/20 appt with following noted: A lot of problems.  Can't drive bc fear and anxiety.  Needed to drive H and couldn't. Fall off toilet twice. Dizzy and HA 2 days ago with a lot of crying and stress with daughter. Leaving home even coming here causes anxiety.    Cataract surgery twice. Fear of [name] being diagnosed with recurrent cancer. Chronic anxiety greather than depression.   Don't know what to do with daughter. Sleep, appetite and energy are OK. Tolerating meds. Sees [name] one day weekly. H helps with meds bc she's forgetful and easily confused.  Loses train of thoughts. Dog has separation anxiety and so they don't go out much.  Plan: no med changes  08/18/20 appt with following noted: 2 ER visitis with vertigo.  RX diazepam  and meclizine . Rarely takes former but takes latter regularly. Shakes so bad it made her cry and scream.  Started PT. Continues lorazepam  low dose 0.5 mg AM and 1.0 mg HS.  Still has dizziness and vertigo. Memory is still bad and H administers meds with  pill box. Chronic anxiety.  Depression manageable. Sleep OK and tolerates meds otherwise. Lorn driving her crazy. Plan: no med changes  11/17/20 appt with following noted: Has had periods of vertigo and meds for it.  Then had manic sx with cleaning and couldn't sleep for 24 hours. Stopped quetiapine  200 and continued quetiapine  XR 800. Also started diazepam  5 mg BID for vertigo and continued lorazepam  for anxiety.  No meclizine  now.  Vertigo stopped and manic sx stopped.  Tolerating meds now. Still feels Lorn is mean to her too often and she will cry over it. Mandy's ExH [name] in jail and is the father of Mandy's [name] [name] 71 yo.  Will be there for 3 mos. Not markedly depressed.  But hates herself some days.  Still memory problems about the  same.  Sleeping OK now.  Periods of mood swings. Easily anxious with relatives visiting.   Plan: Mixed manic sx resolved for now. No med changes today.  02/17/2021 appointment with the following noted: Not good.  A whole lof of everything.  Shaking for mos. It comes and goes.  Shaking at times makes her think she'll have a nervous breakdown.   Went to ER and dx vertigo.  Has tried meclizine  and Valium  but scared to take it.  Big shakes again today.  Memory is not good. Sleep to escape anxiety.  Scared to leave home. Not sure what brings on the spells of shaking. Today sx hit her as soon as she awoke.  Plan: No med changes  03/12/2021 phone call: Patient with a lot of anxiety.  Husband reported the patient had been hallucinating and was paranoid that he had left her.  Some confusion over identity of people that were around her.  It was suggested that she have medical work-up because it sounded like she had delirium and may have a UTI causing that. 03/17/2021 psychiatric hospitalization. 03/22/2021 MD response:  Note Patient has been very unstable with bipolar disorder mixed symptoms lately also with some delirium of unknown cause.  She will be seeing her  primary care physician tomorrow.  Due to the severity of her symptoms it is suggested that she add haloperidol  2 mg nightly to her current quetiapine  800 mg nightly since we cannot increase the quetiapine  dose further.  This may help with her confusion and agitation.  It is okay to talk with her husband because he administers her medication and because she is too confused to understand the instructions.     04/16/21 H called with following:  Pt.'s husband called.  She is currently in [facility] waiting for an inpatient pyschiatric bed.  Pt advised her husband that Dr Geoffry told her to stop taking the Lorazepam .  He said he is not aware of that chang in her prescription.  He would like someone to call him and advise if there were any changes to her meds so he stays aware and can let the hospital know if needed. Husband was informed we had not made any medication changes and would not have suggested she abruptly stopped lorazepam  which could cause withdrawal and other kinds of problems.  05/11/2021 appointment with the following noted:  spoke with H for info At hospital was taken off Ativan , buspirone , and Seroquel  reduced to 500 mg HS (as 1 of XR 400 mg  and 1/2 of 200 mg IR quetiapine ).  Had sedative SE at 200 mg queiapinte !R.  Off risperidone  and haloperidol .   Hospitalized at [facility] for 9 days.  They didn't feel communication was good with them. But she is better now than she was.  Had been psychotic PTH.  Last 2-3 days getting more hyper and can't wind down and hard time going to sleep.  Came home 05/03/21. She didn't like the restrictions at the hospital.  No panic lately and wants something to help her sleep. Plan; Mixed manic sx including psychosis recently but not psychotic now but insomnia.. Will get more psychotic if she doesn't sleep but apparently didn't tolerate Seroquel  IR 200 mg HS well so will increase to quetiapine  IR 150 mg HS and XR 400 mg nightly. Continue  lamotrigine .    06/11/21 appt:  H [name]. H thinks she's doing goood. Doing very fine.  No trouble sleeping.  Sometimes in morning feels  sad bc she knows she will have racing thoughts, but it resolves after a little while.  Racing thoughts since hospital.  Worries over money chronically. To bed 1015-7:30.  Maybe longer which is normal. H administers meds.  Some wordfinding problems.  Does journal which helps. No SE with meds.  Yesterday was hard with anxiety but today is better.  On lamotrigine  150 BID, SERoquel  XR 400 + Seroquel  IR 150 mg HS. Plan: Much better but not fully resolved.  Mixed manic sx including psychosis recently but not psychotic but insomnia last visit resolved with the following... apparently didn't tolerate Seroquel  IR 200 mg HS well so increased quetiapine  IR 150 mg HS and XR 400 mg nightly and now she is sleeping but still having some racing thoughts. Continue lamotrigine  150 BID.    07/14/2021 appointment with the following noted: Bad day yesterday feeling very depressed without anxiety. Not sleeping well lately.  Thinks it's related to less Seroquel  than in the past.  Some EMA.  Always normal slept a lot. Compulsive cleaning and everything in it's place.  Arranges soap dispensers. Cleaning in the middle of the night. No recent psychotic sx but still racing thoughts and chronic $ worries. Still afraid to leave home.  09/20/2021 appt noted: Great overall.  Occ insomnia with EMA.  Still worry but tries not let it consume me.  Still agoraphobia and hates to go out to dentists and doctors.  Challenge to go to dentist.  Can still dread things. Brief nap in daytimes. No SE H administers meds. Seroquel  300 mg HS, XR 400 mg HS.  Lamotrigine  150 Patient reports stable mood and denies depressed or irritable moods.   Patient denies difficulty with sleep initiation or maintenance. Denies appetite disturbance.  Patient reports that energy and motivation have been good.  Patient  denies any difficulty with concentration.  Patient denies any suicidal ideation.  01/20/22 appt noted: also talked with Zell Doesn't like waiting 4 mos between appts. Was manic before increasing Seroquel  with irritability, not sleeping a couple of days and excessive cleaning. Seroquel  increased to IR 400 mg HS.  No SE I feel much Better now.  Doing better at time with anxiety.   Chronic worry over [name] and Yolanda. Yolanda with psych problems. Sleeping good again. Racing thoughts are better but not gone Tolerating meds.   No further changes desired.   Plan: Seroquel  IR 400 mg HS well and XR 400 mg nightly abc recent manic sx better with increase dose. Continue lamotrigine  150 mg twice daily  04/28/2022 appointment noted: Trouble sleeping without extra 100 mg quetiapine  HS and takes XR 400 mg and IR 300 mg at 730 pm.  Otherwise will be up until 3 AM.  Sleep from 11 until 830.  Good unless anxious. Doing fairly well except 2 weeks bad spell from July 31 related to [name] receiving call from woman he worked with 8 years ago and triggered fear ans suspiciousness and insecurity and overwhelmed her.  She cried and over reacted.  Rough 2 weeks but otherwise ok. Real worried about [name] who is self cutting.  Seeing therapist and psychiatrist.  Her father is terrible and inattentive.  She is 71 yo and never happy. I still have agoraphobia and doesn't want to go ut places.  Her dog also has severe anxiety and doesn't like to be alone too.  Still fearul about money.  08/03/22 appt noted: Doing pretty well overall.  Chronic worry over money.  Met old friend on Facebook.  She would contact Clarita a lot.  Was making her sad.  Would cry every time the woman called.  ,This was wearing her out bc woman was hyperverbal.  Cut the woman off.  Since stopped talking to her then no longer crying. When starts to get dark then racing thoughts.  Driving me crazy.  Chronic worry .  Chronic memorhy problems and tendency to be obsessed over  fear of dementia. Irritable and confused.  Might nap 30 min and rest her mind and awakens feeling better. Continues the same meds: lamotrigine  and quetiapine . Gained 20# last year. Plan: Have pushed Seroquel   as far as possible so start another mood stabilizer Trileptal  and increase to 10 mg AM and 300 mg pM  08/09/2022 phone call: She took oxcarbazepine  150 mg Friday and Saturday and developed severe abdominal pain, bowel and bladder incontinence, and impaired vision.  She skipped it on Sunday and symptoms resolved.  She took it again yesterday and the side effects returned.  States it did help with the racing thoughts but she is unable to tolerate it.  That was just on half of oxcarbazepine  tablet. MD response: DC oxcarbazepine  due to side effects.  10/17/22 appt noted: Started Monjauro today. Still racing thoughts and cleaning a lot.  More anxiety than depression.  Rough road.  Worries about everything.   Thoughts jump from one thing to another.  Can't get away from it.  Can't focus.  Hard to take instruction bc of this.    12/20/22 appt noted: Not good.  Kidney stone. More anxiety last couple of mos. Get something on her mind and she cannot get it off.  Obsesses on things that don't matter.  Trouble going to sleep.  Couldn't go into store the other day dT panic and agoraphobia.   Occ dep but nothing to worry about.  More panic.   Lost 15 # on Monjaro.   Plan start sertraline  for anxiety  03/03/23 appt noted: TC Been hosp for kidney px kidney stone.  Glad to be over it.  .   Lost 42 # since April 1 with Monjauro.  Eating better.   Anxiety is better with sertraline  50 mg marked benefit with very little anxiety.  Much happier and less crying.  Better acceptance and self talk better than ever before.   Still some dep which interferes with function at times but not as severe. I'm doing ok.   Tolerating psych meds without problems.  05/03/23 appt noted:  Psych med: lamotrigine  150 BID,  Quetiapine  ER 400 mg pm and IR 300-400 mg HS, sertraline  50 for anxiety. Real sleepy today after taking quetiapine  200 mg , more than usual.  Only takes it prn.  Usually getting enough sleep.  But sometimes keyed up and can't go to sleep.   Been feeling good overall.  Occ bad days.  For the most part doing fine.   Does thrifting which lifts her spirits about 1 time weekly.   It takes her mind off worries.   No marked dep and anxiety is manageable. Lost 55#.   Doing fine with [name].   [name] 73 yo.  Quiet girl. No med change desired.  07/05/23 appt noted:  In person  Had episode of anxiety with severe worry and obsessing over money at times and bad  episode a couple of weeks ago. Lost 63# on Monjaro.  SE hair thinning.  Brittle nails. Can report many potential SE.  Better mobility now.  Very nice emotionally.  Now 212# and goal is 199#.   Blood sugar runs in 70's fasting.   No insulin .  Exercising too.   No sig dep.  I don't have dep like I did years ago but have bad anxiety.  Still can't drive with anxiety and memory px.   Sleep at night is better.  STM is so bad.   H prepares meds.  Forgets how to do tasks at times.   Terrible anxiety.   Psych med: lamotrigine  150 BID, Quetiapine  ER 400 mg pm and IR 300-400 mg HS, sertraline  50 for anxiety. Uses routine to help with memory.   Plan: Trial low dose propranolol  10-20 mg BID prn anxiety bc it helped [name]'s anxiety.    09/06/23 apppt noted: Psych med: lamotrigine  150 BID, Quetiapine  ER 400 mg pm and IR 300-400 mg HS, sertraline  50 for anxiety. Tried propranolol  for anxiety up to 20 BID.  It does help some.  Will get stubborn and not take it.   Had a lot anxiety and trouble sleeping.  When bad I clean like the devil.  Only 1 hour sleep last night.    Hard to learn new things since childhood like technology.  Can't use tablet without his help.   Last night took extra 100 mg Seroquel  and didn't help. No trigger for more anxiety, tension,  scared.  Even real nervous before MD called.   Lost 62#  on Mounjaro.  Feels better physically.   Some people tell her she has dementia.    Worried Seroquel  might cause that.  No clear pcpt bc long term memory problems.  Can't deal with thinking I might have dementia.   Chronic anxiety since childhood.   Has a friend who calls and a lot and chronically is negative and complains all the time.  Sleep worse for 2-3 weeks.   Plan: DC sertraline  and start mirtazapine  15 mg HS for sleep and anxiety  11/15/23 appt noted:  H [name] on phone call Psych med: lamotrigine  150 BID, Quetiapine  ER 400 mg pm and IR 300-400 mg HS, mirtazapine  15 HS Tried propranolol  for anxiety up to 20 BID.  Having dep and anxiety bad.  Sleeping is terrrible.  Went up to 3 days without sleep.  About 2 weeks. No trigger.  Cannot go to sTrying to adapt to it and tolerate it.   Trouble focusing and memory issues worse.  Gets easily overwhelmed and stimulated.  Will get lost in stores and large areas. H notices.  Episodic cleaning episodes over the top.   Loses train of thought Plan: Stop  mirtazapine  DT NR Increase Seroquel  XR 400 mg and add additional IR Seroquel  500 mg HS  12/22/23 APPT NOTED: with [name] on phone call Med: lamotrigine  150 BID, Quetiapine  ER 400 mg pm and IR 500 mg HS,  Very well .  The med change helped.  Sleep is better with some brief awakening.   The med change very helpful.  Usually asleep in 45 mins.   No SE other than sleepiness.  But pleased with med changes. Not dep.  Not as anxious.  Cooking again and wasn't before.    01/19/24 urgent appt: [name] on call Pt called yesterday complaining of increased distress and hallucinations and paranoia.  Med: lamotrigine  150 BID, Quetiapine  ER 400 mg pm and IR 500 mg HS,  [name] said I was imagining things.  I felt like I was having delusions.  Was emotional yesterday.  Lost 9# in a couple of weeks bc  couldn't eat.  Has been negative real bad.   [name] reports she  thought he was talking to people on the phone.   Was in ER after fall on 01/12/24.  Passed out with dizziness and N and couldn't eat.  Had MRI an CT scan.   Stressed from $ expense.  [name] fell a week before that.  Worried over how we are going to make it; worrying about [name].  He has leg wound with cellulitis.  Not getting better but is seeing doctor. Drinks plenty of fluids, but only Sprite Zero.   Dizziness resolved off ezetimibe . General fearfulness yesterday, I couldn't stop it.   Complains of seeing things that aren't actually happening; that he's on the phone to other women.  Wake during the night thinking about it.  Can't eat bc can't get that fear out of my mind.  Then says sleeping good.  The fear is so bad.  Had a spell like this in 1983. Plan: Reduce quetiapine  to XR 400 mg pm with quetiapine  100 mg IR HS In it's place retry olanzapine  10 mg HS.  Will try to transition off quetiapine  but needs to be done gradually.  02/22/24 appt noted;  with [name] Med: Lamotrigine  150 mg twice daily, OFF metoprolol  XL 50 mg daily, propranolol  20 mg twice daily PRN Anxiety, olanzapine  15 mg nightly, quetiapine  ER 200 + IR 50-100 mg HS Sleep better. But pain interferes.  Likes to sleep to escape her anxiety.   Been to a lot of doctors for diffuse body pain interfering with sleep. I still have delusions but able to work through them fairly well.  Mind is better but every day is different.  Not 100%.  Can tell a difference between anxiety and depression.   No SE.  Her PCP doesn't agree with her psych meds.   Chronic memory issues.  Trouble doing complicated things like operating cell phone. Had MRI Chronically worries about family and esp about H's health and his future.  No longer excessive cleaning nor other manic sx.   Appreciates her psychiatric care and glad to try something else besides quetiapine .  H agrees she's less anxious and hyper and not generally paranoid.  But still periods of confusion and  pending neuro appt.    Prior psychiatric medication trials include  paroxetine , Lexapro, sertraline  50 Sertraline  50 daily.    risperidone  4 mg which was sedating, aripiprazole,  perphenazine,  Seroquel  1000,  olanzapine  15 Lamotrigine  300.    lithium which was not tolerated even at very low dosages.  Oxcarbazepine  150 mg bowel and bladder incontinence and impaired vision. Topamax for anxiety,  She was intolerant of both Namenda and Aricept.    Xanax , Ativan , diazepam  5 mg BID    Propranolol  20 prn helped anxiety  This is not an exhaustive list.   Psych hospitalization 04/2021 at [facility] for psychosis.  At the visit in February 2020 Bruchy was more acutely confused recently for no clear reason.   She had a negative medical work-up for causes of short-term memory problems and confusion.  Therefore the decision was made to try switching her from alprazolam  to lorazepam  at the lowest possible dose in hopes of improving her cognition.  This was successful and her cognition was improved significantly and her husband verified this.    Yolanda [name] seeing Dr. Vicenta [name] noted sig anxiety benefit with propranolol   Neuropsych testing mid 2019 at [facility] but results not on chart.    Review of Systems:  Review of Systems  Eyes:  Positive for visual disturbance.  Cardiovascular:  Negative for chest pain and palpitations.  Musculoskeletal:  Positive for arthralgias, back pain and gait problem.  Neurological:  Positive for dizziness. Negative for weakness.  Psychiatric/Behavioral:  Positive for confusion and decreased concentration. Negative for agitation, behavioral problems, dysphoric mood, hallucinations, self-injury, sleep disturbance and suicidal ideas. The patient is nervous/anxious. The patient is not hyperactive.     Medications: I have reviewed the patient's current medications.  Current Outpatient Medications  Medication Sig Dispense Refill   amLODipine  (NORVASC ) 5 MG tablet  Take 5 mg by mouth every evening.     ezetimibe  (ZETIA ) 10 MG tablet Take 1 tablet (10 mg total) by mouth daily. 90 tablet 3   famotidine  (PEPCID ) 20 MG tablet Take 20 mg by mouth in the morning and at bedtime.     furosemide  (LASIX ) 20 MG tablet Take 20 mg by mouth daily as needed (for severe swelling).     lamoTRIgine  (LAMICTAL ) 150 MG tablet TAKE 1 TABLET TWICE DAILY 180 tablet 3   levocetirizine (XYZAL ) 5 MG tablet Take 5 mg by mouth every evening.     levothyroxine  (SYNTHROID ) 100 MCG tablet Take 100 mcg by mouth daily before breakfast.     lisinopril  (ZESTRIL ) 20 MG tablet Take 1 tablet (20 mg total) by mouth daily.     meloxicam  (MOBIC ) 15 MG tablet Take 15 mg by mouth daily.     Multiple Vitamin (MULTIVITAMIN) tablet Take 1 tablet by mouth daily.     polyethylene glycol (MIRALAX  / GLYCOLAX ) packet Take 17 g by mouth 2 (two) times daily as needed for mild constipation.     potassium chloride  SA (K-DUR,KLOR-CON ) 20 MEQ tablet Take 20 mEq by mouth daily.     propranolol  (INDERAL ) 10 MG tablet TAKE 1 TO 2 TABLETS BY MOUTH TWICE A DAY AS NEEDED FOR ANXIETY 360 tablet 0   simvastatin  (ZOCOR ) 40 MG tablet Take 1 tablet (40 mg total) by mouth every evening. 30 tablet 0   tirzepatide (MOUNJARO) 12.5 MG/0.5ML Pen Inject 12.5 mg into the skin once a week.     acetaminophen  (TYLENOL ) 325 MG tablet Take 650 mg by mouth every 6 (six) hours as needed for mild pain or headache.     aspirin  81 MG tablet Take 81 mg by mouth daily.     cephALEXin  (KEFLEX ) 250 MG capsule Take 250 mg by mouth 2 (two) times daily.     metoprolol  succinate (TOPROL -XL) 50 MG 24 hr tablet Take 50 mg by mouth daily. (Patient not taking: Reported on 02/22/2024)     OLANZapine  (ZYPREXA ) 15 MG tablet Take 1 tablet (15 mg total) by mouth at bedtime. 30 tablet 1   QUEtiapine  (SEROQUEL ) 50 MG tablet Take 1-2 tablets (50-100 mg total) by mouth at bedtime. 60 tablet 1   No current facility-administered medications for this visit.     Medication Side Effects: None  Allergies:  Allergies  Allergen Reactions   Maxzide [Triamterene-Hctz] Other (See Comments)    Hurt patient's kidneys   Celexa [Citalopram Hydrobromide] Nausea Only   Metformin And Related Nausea And Vomiting   Other Nausea And Vomiting and Other (See Comments)    Unnamed medication hurt the patient's kidneys   Reglan  [Metoclopramide ] Nausea Only   Risperdal  [Risperidone ] Nausea Only and Other (See Comments)    Also caused hallucinations   Trazodone And Nefazodone Nausea Only   Zestril  [Lisinopril ] Nausea Only   Lithium Palpitations and Other (See  Comments)    Shakes and delusional pt started seeing things      Past Medical History:  Diagnosis Date   Anxiety    Arthritis    Asthma    Bipolar 1 disorder (HCC)    CHF (congestive heart failure) (HCC)    Complication of anesthesia    left a bad taste in my mouth it has lasted since january    Depression    Diabetes mellitus    Hernia    umbilical   Hyperlipidemia    Hypertension    Hypothyroidism    Pericarditis, viral 2010 October   Sleep apnea    uses cpap setting of 15    Family History  Problem Relation Age of Onset   Diabetes Mother    Hypertension Mother    Hyperlipidemia Mother    Diabetes Father    Hypertension Father    Hyperlipidemia Father    Hypertension Sister     Social History   Socioeconomic History   Marital status: Married    Spouse name: Not on file   Number of children: Not on file   Years of education: Not on file   Highest education level: Not on file  Occupational History   Not on file  Tobacco Use   Smoking status: Never   Smokeless tobacco: Never  Substance and Sexual Activity   Alcohol use: No   Drug use: No   Sexual activity: Not on file  Other Topics Concern   Not on file  Social History Narrative   Not on file   Social Drivers of Health   Financial Resource Strain: Not on file  Food Insecurity: No Food Insecurity (12/22/2022)    Hunger Vital Sign    Worried About Running Out of Food in the Last Year: Never true    Ran Out of Food in the Last Year: Never true  Transportation Needs: No Transportation Needs (12/22/2022)   PRAPARE - Administrator, Civil Service (Medical): No    Lack of Transportation (Non-Medical): No  Physical Activity: Not on file  Stress: Not on file  Social Connections: Not on file  Intimate Partner Violence: Not At Risk (12/22/2022)   Humiliation, Afraid, Rape, and Kick questionnaire    Fear of Current or Ex-Partner: No    Emotionally Abused: No    Physically Abused: No    Sexually Abused: No    Past Medical History, Surgical history, Social history, and Family history were reviewed and updated as appropriate.   Please see review of systems for further details on the patient's review from today.   Objective:   Physical Exam:  There were no vitals taken for this visit.  Physical Exam Neurological:     Mental Status: She is alert and oriented to person, place, and time.     Cranial Nerves: No dysarthria.  Psychiatric:        Attention and Perception: Perception normal. She is inattentive.        Mood and Affect: Mood is anxious. Mood is not depressed. Affect is not labile or tearful.        Speech: Speech is not rapid and pressured or slurred.        Behavior: Behavior is cooperative.        Thought Content: Thought content is not paranoid or delusional. Thought content does not include homicidal or suicidal ideation. Thought content does not include suicidal plan.        Cognition and Memory:  Cognition normal. Cognition is not impaired. She exhibits impaired recent memory.        Judgment: Judgment normal.     Comments: Insight intact Talkative.   No psychosis noted     Lab Review:     Component Value Date/Time   NA 137 01/05/2024 0728   K 3.6 01/05/2024 0728   CL 108 01/05/2024 0728   CO2 23 01/05/2024 0728   GLUCOSE 94 01/05/2024 0728   GLUCOSE 154 (H)  09/01/2006 1100   BUN 19 01/05/2024 0728   CREATININE 1.07 (H) 01/05/2024 0728   CREATININE 0.93 07/19/2011 1110   CALCIUM 9.9 01/05/2024 0728   PROT 6.8 12/22/2022 0300   ALBUMIN 3.6 12/22/2022 0300   AST 19 12/22/2022 0300   ALT 15 12/22/2022 0300   ALKPHOS 76 12/22/2022 0300   BILITOT 0.7 12/22/2022 0300   GFRNONAA 56 (L) 01/05/2024 0728   GFRAA >60 03/23/2016 1933       Component Value Date/Time   WBC 7.0 01/05/2024 0728   RBC 4.02 01/05/2024 0728   HGB 12.7 01/05/2024 0728   HCT 38.7 01/05/2024 0728   PLT 172 01/05/2024 0728   MCV 96.3 01/05/2024 0728   MCH 31.6 01/05/2024 0728   MCHC 32.8 01/05/2024 0728   RDW 13.6 01/05/2024 0728   LYMPHSABS 1.2 04/15/2021 1831   MONOABS 0.4 04/15/2021 1831   EOSABS 0.1 04/15/2021 1831   BASOSABS 0.1 04/15/2021 1831   Prior lamotrigine  level in 2018 on this dosage was 3.6.  Not particularly high.  Per Dr. Mattie: Clinical Impressions: Cognitive complaints are likely due to underlying psychiatric disorder (bipolar disorder/anxiety disorder with racing thoughts). Diagnostic impressions based on test performances are limited due to the patient's severe inability to fully attend to the tasks. However, based on clinical presentation, I highly doubt the presence of a neurodegenerative dementia. It is much more likely that her racing thoughts and psychiatric disorders are interfering with cognitive   .res Assessment: Plan:    Bipolar affective disorder, mixed, severe, with psychotic behavior (HCC) - Plan: QUEtiapine  (SEROQUEL ) 50 MG tablet, OLANZapine  (ZYPREXA ) 15 MG tablet  Generalized anxiety disorder  Panic disorder with agoraphobia  Insomnia due to mental condition - Plan: QUEtiapine  (SEROQUEL ) 50 MG tablet  Mild cognitive impairment  Low vitamin [name]  level  Claustrophobia   Romero has chronic severe anxiety with panic attacks as well as bipolar disorder with a history of significant lability.  She has had more severe instability  with psychotic sx and cognitive problems off and on lately.   Sx mixed bipolar and anxiety with insomnia had resolved with increase Seroquel , but unde rmore stress was severely anxious and paranoid and hallucinating again. No hallucinations now.  Will ruminate some but better not gone.  She also has mild cognitive impairment as a complicating factor.  She needs her husband's assistance to manage her medications.    Consider switch to Caplyta or Saphris bc lower SE and may help anxiety but probably would not be covered by insurance. Reviewed old chart re: med options.    Discussed potential metabolic side effects associated with atypical antipsychotics, as well as potential risk for movement side effects. Advised pt to contact office if movement side effects occur.  Disc family's fear of LT SE.  However current meds work and without them sx unmanageable.  Has been able to stop BZ   Continue MVI.    Pt is chronically needy and requires extended appts DT chronic anxiety and easily stressed but much  better with sertraline  low dose for anxiety. Cannot drive now and wasn't much before.    No evidence of medical cause for STM px.  FHX dementia including mother.   Pt doesn't feel able to fix her meds by herself.  Gives up quickly on task demands.    Continue lamotrigine  150 mg BID  Disc potential hospitalization for paranoia and severe anxiety and difficulty eating.  Will try to prevent it by starting process of switching to olanzapine . Reduce quetiapine  to  IR 150 mg HS and stop XR.  Will try to transition off quetiapine  but needs to be done gradually. Continue olanzapine  15 mg HS  FU as sched.  If not better or if anything worse then call.    Lorene Macintosh, MD, DFAPA   Future Appointments  Date Time Provider Department Center  03/26/2024  2:45 PM Gershon Donnice [facility]UNDERS, [place] [facility]  06/07/2024  1:15 PM [facility] NURSE [facility] None  06/07/2024  1:30 PM Dina Camie BRAVO, PA-C  [facility] None       No orders of the defined types were placed in this encounter.      -------------------------------

## 2024-03-05 ENCOUNTER — Telehealth: Admitting: Psychiatry

## 2024-03-15 DIAGNOSIS — N183 Chronic kidney disease, stage 3 unspecified: Secondary | ICD-10-CM | POA: Diagnosis not present

## 2024-03-15 DIAGNOSIS — I129 Hypertensive chronic kidney disease with stage 1 through stage 4 chronic kidney disease, or unspecified chronic kidney disease: Secondary | ICD-10-CM | POA: Diagnosis not present

## 2024-03-15 DIAGNOSIS — I350 Nonrheumatic aortic (valve) stenosis: Secondary | ICD-10-CM | POA: Diagnosis not present

## 2024-03-15 DIAGNOSIS — E1129 Type 2 diabetes mellitus with other diabetic kidney complication: Secondary | ICD-10-CM | POA: Diagnosis not present

## 2024-03-20 ENCOUNTER — Other Ambulatory Visit: Payer: Self-pay | Admitting: Psychiatry

## 2024-03-20 DIAGNOSIS — F5105 Insomnia due to other mental disorder: Secondary | ICD-10-CM

## 2024-03-20 DIAGNOSIS — F3164 Bipolar disorder, current episode mixed, severe, with psychotic features: Secondary | ICD-10-CM

## 2024-03-21 DIAGNOSIS — J45909 Unspecified asthma, uncomplicated: Secondary | ICD-10-CM | POA: Diagnosis not present

## 2024-03-21 DIAGNOSIS — I350 Nonrheumatic aortic (valve) stenosis: Secondary | ICD-10-CM | POA: Diagnosis not present

## 2024-03-21 DIAGNOSIS — F319 Bipolar disorder, unspecified: Secondary | ICD-10-CM | POA: Diagnosis not present

## 2024-03-21 DIAGNOSIS — E1129 Type 2 diabetes mellitus with other diabetic kidney complication: Secondary | ICD-10-CM | POA: Diagnosis not present

## 2024-03-21 DIAGNOSIS — I129 Hypertensive chronic kidney disease with stage 1 through stage 4 chronic kidney disease, or unspecified chronic kidney disease: Secondary | ICD-10-CM | POA: Diagnosis not present

## 2024-03-21 DIAGNOSIS — N183 Chronic kidney disease, stage 3 unspecified: Secondary | ICD-10-CM | POA: Diagnosis not present

## 2024-03-26 ENCOUNTER — Ambulatory Visit: Admitting: Podiatry

## 2024-03-26 ENCOUNTER — Encounter: Payer: Self-pay | Admitting: Podiatry

## 2024-03-26 DIAGNOSIS — B351 Tinea unguium: Secondary | ICD-10-CM | POA: Diagnosis not present

## 2024-03-26 DIAGNOSIS — M79675 Pain in left toe(s): Secondary | ICD-10-CM

## 2024-03-26 DIAGNOSIS — M79674 Pain in right toe(s): Secondary | ICD-10-CM | POA: Diagnosis not present

## 2024-03-26 NOTE — Progress Notes (Signed)
 Subjective:   Patient ID: [name], female   DOB: 73 y.o.   MRN: [id number]   HPI Chief Complaint  Patient presents with   [facility]    Rm11 Diabetic foot care last A1C 5/patient says she is doing well.    73 year old female presents the office today with above concerns.  She states her nails are thick elongated she cannot do them herself and they become painful. No open lesions. No new concerns.      Objective:  Physical Exam  General: AAO x3, NAD  Dermatological: Nails are hypertrophic, dystrophic, brittle, discolored, elongated 10. No surrounding redness or drainage. Tenderness nails 1-5 bilaterally. No open lesions.   Vascular: Dorsalis Pedis artery and Posterior Tibial artery pedal pulses are 2/4 bilateral with immedate capillary fill time. There is no pain with calf compression, swelling, warmth, erythema.   Neruologic: Sensation decreased with Semmes Weinstein monofilament.  Musculoskeletal: No other areas of discomfort.   Gait: Unassisted, Nonantalgic.       Assessment:   Symptomatic onychomycosis     Plan:  -Treatment options discussed including all alternatives, risks, and complications -Etiology of symptoms were discussed -Sharply debrided nails x 10 to any complications or bleeding -Daily foot inspection  Return in about 3 months (around 06/26/2024).  Donnice JONELLE Fees DPM

## 2024-03-28 ENCOUNTER — Other Ambulatory Visit: Payer: Medicare HMO

## 2024-03-28 ENCOUNTER — Other Ambulatory Visit: Payer: Self-pay | Admitting: Psychiatry

## 2024-03-28 DIAGNOSIS — F3164 Bipolar disorder, current episode mixed, severe, with psychotic features: Secondary | ICD-10-CM

## 2024-04-14 DIAGNOSIS — E1129 Type 2 diabetes mellitus with other diabetic kidney complication: Secondary | ICD-10-CM | POA: Diagnosis not present

## 2024-04-14 DIAGNOSIS — N183 Chronic kidney disease, stage 3 unspecified: Secondary | ICD-10-CM | POA: Diagnosis not present

## 2024-04-14 DIAGNOSIS — I350 Nonrheumatic aortic (valve) stenosis: Secondary | ICD-10-CM | POA: Diagnosis not present

## 2024-04-14 DIAGNOSIS — I129 Hypertensive chronic kidney disease with stage 1 through stage 4 chronic kidney disease, or unspecified chronic kidney disease: Secondary | ICD-10-CM | POA: Diagnosis not present

## 2024-04-17 ENCOUNTER — Telehealth: Admitting: Psychiatry

## 2024-04-18 ENCOUNTER — Ambulatory Visit: Admitting: Psychiatry

## 2024-04-18 ENCOUNTER — Encounter: Payer: Self-pay | Admitting: Psychiatry

## 2024-04-18 DIAGNOSIS — F3164 Bipolar disorder, current episode mixed, severe, with psychotic features: Secondary | ICD-10-CM

## 2024-04-18 DIAGNOSIS — F411 Generalized anxiety disorder: Secondary | ICD-10-CM

## 2024-04-18 DIAGNOSIS — F40240 Claustrophobia: Secondary | ICD-10-CM | POA: Diagnosis not present

## 2024-04-18 DIAGNOSIS — R7989 Other specified abnormal findings of blood chemistry: Secondary | ICD-10-CM | POA: Diagnosis not present

## 2024-04-18 DIAGNOSIS — G3184 Mild cognitive impairment, so stated: Secondary | ICD-10-CM

## 2024-04-18 DIAGNOSIS — F4001 Agoraphobia with panic disorder: Secondary | ICD-10-CM | POA: Diagnosis not present

## 2024-04-18 DIAGNOSIS — F5105 Insomnia due to other mental disorder: Secondary | ICD-10-CM

## 2024-04-18 MED ORDER — QUETIAPINE FUMARATE 100 MG PO TABS: 100.0000 mg | ORAL_TABLET | Freq: Every day | ORAL | 0 refills | Status: DC

## 2024-04-18 NOTE — Progress Notes (Signed)
 [name] [id number] September 12, 1950 73 y.o.   Virtual Visit via Telephone Note  I connected with pt by telephone and verified that I am speaking with the correct person using two identifiers.   I discussed the limitations, risks, security and privacy concerns of performing an evaluation and management service by telephone and the availability of in person appointments. I also discussed with the patient that there may be a patient responsible charge related to this service. The patient expressed understanding and agreed to proceed.  I discussed the assessment and treatment plan with the patient. The patient was provided an opportunity to ask questions and all were answered. The patient agreed with the plan and demonstrated an understanding of the instructions.   The patient was advised to call back or seek an in-person evaluation if the symptoms worsen or if the condition fails to improve as anticipated.  I provided 30 minutes of non-face-to-face time during this encounter. The call started at 930 and ended at 1000. The patient was located at home and the provider was located office.   Subjective:   Patient ID:  [name] is a 73 y.o. (DOB May 16, 1951) female.  Chief Complaint:  Chief Complaint  Patient presents with   Follow-up   Anxiety   Medication Reaction    [name] presents to the office today for follow-up of anxiety and depression and poor cognition.  At her visit October 12, 2018.  Because of her poor cognition which did seem to get even worse lately with her memory and given a negative unremarkable medical work-up by her primary care doctor the decision was made to change her from Xanax  which she has been on for years to lorazepam .  Lorazepam  often has less cognitive side effects than does alprazolam .  She had a lot of difficulty with headaches and insomnia with the transition.  She called several times in that transition.  Her husband reported that her cognition  was better after the transition however.  She was satisfied with the Ativan .  There was an attempt to reduce her lamotrigine  to 150 mg also in hopes of improving cognition but it was uncertain at the last visit where there she had actually done so.  visit December 2020 without med changes.  She had continued to do cognitively better off alprazolam  and on lorazepam .  seen March 2021.   Better than December visit.  Feeling good and less down.  Adjusting time of med helped excessively sleep.  Brief hypomanic episode resolved where she cleaned all night.  Sleep 10 hours . No med changes.  12/23/19 appt.  Noted: More depression, crying and racing thoughts and distressed to the point of wondering if needed the hospital.  Sleep got worse and needed Seroquel  IR 200 also bc went 2 days without sleep.  It worked and helped sleep.  Worry a lot and melancholy this week.  Cries over what happens dealing with her daugher.[name] Peter has prostate and kidney cancer again.  Had surgery June 10, 2019 and she's cried a lot.  He's incontinent and very uncomfortable.  He usually has positive attitude.   Worry over his health and how she'll do if something happens to him.  She's afraid of Covid.  Trusting in God usually.  She realizes she  Needs to drive occasionally bc of his health.  [name] had prostate CA and lived to 90 yo.  Uncle died of it.  Not markedly depressed. Can't walk much dT plantar fascitis. Also  stressed over [name] 71 yo not doing school work and causing problems.  Her father [name] is a bad parent and not helpful.  Stressed over [employer] and downs too and they have periodic conflict.   More down and anxious.   Guilt tendency.   Patient reports difficulty with sleep initiation or maintenance in part DT anxiety and racing thoughts.Intermittent sleep problems with days and nights mixed up.  Not napping. Not manic.[name] Denies appetite disturbance.  Patient reports that energy and motivation have been good.   Patient has some difficulty with concentration.  Chronic memory problems.   Patient denies any suicidal ideation. Plan:   No changes in meds indicated except agree to take the extra Seroquel  200 IR HS for mixed manic sx that are worse right now.  May be having mixed seasonal sx.  02/20/2020 appointment with the following noted: Doing fairly good except chronic worry over [name].  Some days cries a lot over it.  D having a hard time lately.  Still easily stressed but not more than usual. Stopped extra quetiapine  200 mg HS bc no longer needed it. Anxiety is chronic but at baseline as is depression and not manic now. H helps her take the meds.   Memory is still bad but has been worse when on Xanax  instead fo Ativan .  Sleep is fine from 10 to 9 which is much better than in the past.  Occ spells of staying up for 2 days and then needs to take quetiapine .   [name] goes for FU soon for cancer check up. Plan: no med changes  05/21/20 appt with following noted: A lot of problems.  Can't drive bc fear and anxiety.  Needed to drive H and couldn't. Fall off toilet twice. Dizzy and HA 2 days ago with a lot of crying and stress with daughter. Leaving home even coming here causes anxiety.    Cataract surgery twice. Fear of Bill being diagnosed with recurrent cancer. Chronic anxiety greather than depression.   Don't know what to do with daughter. Sleep, appetite and energy are OK. Tolerating meds. Sees [name] one day weekly. H helps with meds bc she's forgetful and easily confused.  Loses train of thoughts. Dog has separation anxiety and so they don't go out much.  Plan: no med changes  08/18/20 appt with following noted: 2 ER visitis with vertigo.  RX diazepam  and meclizine . Rarely takes former but takes latter regularly. Shakes so bad it made her cry and scream.  Started PT. Continues lorazepam  low dose 0.5 mg AM and 1.0 mg HS.  Still has dizziness and vertigo. Memory is still bad and H  administers meds with pill box. Chronic anxiety.  Depression manageable. Sleep OK and tolerates meds otherwise. Lorn driving her crazy. Plan: no med changes  11/17/20 appt with following noted: Has had periods of vertigo and meds for it.  Then had manic sx with cleaning and couldn't sleep for 24 hours. Stopped quetiapine  200 and continued quetiapine  XR 800. Also started diazepam  5 mg BID for vertigo and continued lorazepam  for anxiety.  No meclizine  now.  Vertigo stopped and manic sx stopped.  Tolerating meds now. Still feels Lorn is mean to her too often and she will cry over it. Mandy's ExH [name] in jail and is the father of Mandy's D [name] 38 yo.  Will be there for 3 mos. Not markedly depressed.  But hates herself some days.  Still memory problems about the same.  Sleeping OK now.  Periods of mood swings. Easily anxious with relatives visiting.   Plan: Mixed manic sx resolved for now. No med changes today.  02/17/2021 appointment with the following noted: Not good.  A whole lof of everything.  Shaking for mos. It comes and goes.  Shaking at times makes her think she'll have a nervous breakdown.   Went to ER and dx vertigo.  Has tried meclizine  and Valium  but scared to take it.  Big shakes again today.  Memory is not good. Sleep to escape anxiety.  Scared to leave home. Not sure what brings on the spells of shaking. Today sx hit her as soon as she awoke.  Plan: No med changes  03/12/2021 phone call: Patient with a lot of anxiety.  Husband reported the patient had been hallucinating and was paranoid that he had left her.  Some confusion over identity of people that were around her.  It was suggested that she have medical work-up because it sounded like she had delirium and may have a UTI causing that. 03/17/2021 psychiatric hospitalization. 03/22/2021 MD response:  Note Patient has been very unstable with bipolar disorder mixed symptoms lately also with some delirium of unknown cause.  She  will be seeing her primary care physician tomorrow.  Due to the severity of her symptoms it is suggested that she add haloperidol  2 mg nightly to her current quetiapine  800 mg nightly since we cannot increase the quetiapine  dose further.  This may help with her confusion and agitation.  It is okay to talk with her husband because he administers her medication and because she is too confused to understand the instructions.     04/16/21 H called with following:  Pt.'s husband called.  She is currently in [facility] waiting for an inpatient pyschiatric bed.  Pt advised her husband that Dr Geoffry told her to stop taking the Lorazepam .  He said he is not aware of that chang in her prescription.  He would like someone to call him and advise if there were any changes to her meds so he stays aware and can let the hospital know if needed. Husband was informed we had not made any medication changes and would not have suggested she abruptly stopped lorazepam  which could cause withdrawal and other kinds of problems.  05/11/2021 appointment with the following noted:  spoke with H for info At hospital was taken off Ativan , buspirone , and Seroquel  reduced to 500 mg HS (as 1 of XR 400 mg  and 1/2 of 200 mg IR quetiapine ).  Had sedative SE at 200 mg queiapinte !R.  Off risperidone  and haloperidol .   Hospitalized at [facility] for 9 days.  They didn't feel communication was good with them. But she is better now than she was.  Had been psychotic PTH.  Last 2-3 days getting more hyper and can't wind down and hard time going to sleep.  Came home 05/03/21. She didn't like the restrictions at the hospital.  No panic lately and wants something to help her sleep. Plan; Mixed manic sx including psychosis recently but not psychotic now but insomnia.. Will get more psychotic if she doesn't sleep but apparently didn't tolerate Seroquel  IR 200 mg HS well so will increase to quetiapine  IR 150 mg HS and XR 400 mg  nightly. Continue lamotrigine .    06/11/21 appt:  VEAR Peter. H thinks she's doing goood. Doing very fine.  No trouble sleeping.  Sometimes in morning feels sad bc  she knows she will have racing thoughts, but it resolves after a little while.  Racing thoughts since hospital.  Worries over money chronically. To bed 1015-7:30.  Maybe longer which is normal. H administers meds.  Some wordfinding problems.  Does journal which helps. No SE with meds.  Yesterday was hard with anxiety but today is better.  On lamotrigine  150 BID, SERoquel  XR 400 + Seroquel  IR 150 mg HS. Plan: Much better but not fully resolved.  Mixed manic sx including psychosis recently but not psychotic but insomnia last visit resolved with the following... apparently didn't tolerate Seroquel  IR 200 mg HS well so increased quetiapine  IR 150 mg HS and XR 400 mg nightly and now she is sleeping but still having some racing thoughts. Continue lamotrigine  150 BID.    07/14/2021 appointment with the following noted: Bad day yesterday feeling very depressed without anxiety. Not sleeping well lately.  Thinks it's related to less Seroquel  than in the past.  Some EMA.  Always normal slept a lot. Compulsive cleaning and everything in it's place.  Arranges soap dispensers. Cleaning in the middle of the night. No recent psychotic sx but still racing thoughts and chronic $ worries. Still afraid to leave home.  09/20/2021 appt noted: Great overall.  Occ insomnia with EMA.  Still worry but tries not let it consume me.  Still agoraphobia and hates to go out to dentists and doctors.  Challenge to go to dentist.  Can still dread things. Brief nap in daytimes. No SE H administers meds. Seroquel  300 mg HS, XR 400 mg HS.  Lamotrigine  150 Patient reports stable mood and denies depressed or irritable moods.   Patient denies difficulty with sleep initiation or maintenance. Denies appetite disturbance.  Patient reports that energy and motivation have been  good.  Patient denies any difficulty with concentration.  Patient denies any suicidal ideation.  01/20/22 appt noted: also talked with Zell Doesn't like waiting 4 mos between appts. Was manic before increasing Seroquel  with irritability, not sleeping a couple of days and excessive cleaning. Seroquel  increased to IR 400 mg HS.  No SE I feel much Better now.  Doing better at time with anxiety.   Chronic worry over D and GD. GD with psych problems. Sleeping good again. Racing thoughts are better but not gone Tolerating meds.   No further changes desired.   Plan: Seroquel  IR 400 mg HS well and XR 400 mg nightly abc recent manic sx better with increase dose. Continue lamotrigine  150 mg twice daily  04/28/2022 appointment noted: Trouble sleeping without extra 100 mg quetiapine  HS and takes XR 400 mg and IR 300 mg at 730 pm.  Otherwise will be up until 3 AM.  Sleep from 11 until 830.  Good unless anxious. Doing fairly well except 2 weeks bad spell from July 31 related to Bill receiving call from woman he worked with 8 years ago and triggered fear ans suspiciousness and insecurity and overwhelmed her.  She cried and over reacted.  Rough 2 weeks but otherwise ok. Real worried about [name] who is self cutting.  Seeing therapist and psychiatrist.  Her father is terrible and inattentive.  She is 73 yo and never happy. I still have agoraphobia and doesn't want to go ut places.  Her dog also has severe anxiety and doesn't like to be alone too.  Still fearul about money.  08/03/22 appt noted: Doing pretty well overall.  Chronic worry over money.  Met old friend on Facebook.  She  would contact Clarita a lot.  Was making her sad.  Would cry every time the woman called.  ,This was wearing her out bc woman was hyperverbal.  Cut the woman off.  Since stopped talking to her then no longer crying. When starts to get dark then racing thoughts.  Driving me crazy.  Chronic worry .  Chronic memorhy problems and tendency to be  obsessed over fear of dementia. Irritable and confused.  Might nap 30 min and rest her mind and awakens feeling better. Continues the same meds: lamotrigine  and quetiapine . Gained 20# last year. Plan: Have pushed Seroquel   as far as possible so start another mood stabilizer Trileptal  and increase to 10 mg AM and 300 mg pM  08/09/2022 phone call: She took oxcarbazepine  150 mg Friday and Saturday and developed severe abdominal pain, bowel and bladder incontinence, and impaired vision.  She skipped it on Sunday and symptoms resolved.  She took it again yesterday and the side effects returned.  States it did help with the racing thoughts but she is unable to tolerate it.  That was just on half of oxcarbazepine  tablet. MD response: DC oxcarbazepine  due to side effects.  10/17/22 appt noted: Started Monjauro today. Still racing thoughts and cleaning a lot.  More anxiety than depression.  Rough road.  Worries about everything.   Thoughts jump from one thing to another.  Can't get away from it.  Can't focus.  Hard to take instruction bc of this.    12/20/22 appt noted: Not good.  Kidney stone. More anxiety last couple of mos. Get something on her mind and she cannot get it off.  Obsesses on things that don't matter.  Trouble going to sleep.  Couldn't go into store the other day dT panic and agoraphobia.   Occ dep but nothing to worry about.  More panic.   Lost 15 # on Monjaro.   Plan start sertraline  for anxiety  03/03/23 appt noted: TC Been hosp for kidney px kidney stone.  Glad to be over it.  .   Lost 42 # since April 1 with Monjauro.  Eating better.   Anxiety is better with sertraline  50 mg marked benefit with very little anxiety.  Much happier and less crying.  Better acceptance and self talk better than ever before.   Still some dep which interferes with function at times but not as severe. I'm doing ok.   Tolerating psych meds without problems.  05/03/23 appt noted:  Psych med: lamotrigine   150 BID, Quetiapine  ER 400 mg pm and IR 300-400 mg HS, sertraline  50 for anxiety. Real sleepy today after taking quetiapine  200 mg , more than usual.  Only takes it prn.  Usually getting enough sleep.  But sometimes keyed up and can't go to sleep.   Been feeling good overall.  Occ bad days.  For the most part doing fine.   Does thrifting which lifts her spirits about 1 time weekly.   It takes her mind off worries.   No marked dep and anxiety is manageable. Lost 55#.   Doing fine with Bill.   [name] 71 yo.  Quiet girl. No med change desired.  07/05/23 appt noted:  In person  Had episode of anxiety with severe worry and obsessing over money at times and bad  episode a couple of weeks ago. Lost 63# on Monjaro.  SE hair thinning.  Brittle nails. Can report many potential SE.  Better mobility now.  Very nice emotionally.  Now 212# and goal is 199#.   Blood sugar runs in 70's fasting.   No insulin .  Exercising too.   No sig dep.  I don't have dep like I did years ago but have bad anxiety.  Still can't drive with anxiety and memory px.   Sleep at night is better.  STM is so bad.   H prepares meds.  Forgets how to do tasks at times.   Terrible anxiety.   Psych med: lamotrigine  150 BID, Quetiapine  ER 400 mg pm and IR 300-400 mg HS, sertraline  50 for anxiety. Uses routine to help with memory.   Plan: Trial low dose propranolol  10-20 mg BID prn anxiety bc it helped D's anxiety.    09/06/23 apppt noted: Psych med: lamotrigine  150 BID, Quetiapine  ER 400 mg pm and IR 300-400 mg HS, sertraline  50 for anxiety. Tried propranolol  for anxiety up to 20 BID.  It does help some.  Will get stubborn and not take it.   Had a lot anxiety and trouble sleeping.  When bad I clean like the devil.  Only 1 hour sleep last night.    Hard to learn new things since childhood like technology.  Can't use tablet without his help.   Last night took extra 100 mg Seroquel  and didn't help. No trigger for more anxiety,  tension, scared.  Even real nervous before MD called.   Lost 62#  on Mounjaro.  Feels better physically.   Some people tell her she has dementia.    Worried Seroquel  might cause that.  No clear pcpt bc long term memory problems.  Can't deal with thinking I might have dementia.   Chronic anxiety since childhood.   Has a friend who calls and a lot and chronically is negative and complains all the time.  Sleep worse for 2-3 weeks.   Plan: DC sertraline  and start mirtazapine  15 mg HS for sleep and anxiety  11/15/23 appt noted:  [name] on phone call Psych med: lamotrigine  150 BID, Quetiapine  ER 400 mg pm and IR 300-400 mg HS, mirtazapine  15 HS Tried propranolol  for anxiety up to 20 BID.  Having dep and anxiety bad.  Sleeping is terrrible.  Went up to 3 days without sleep.  About 2 weeks. No trigger.  Cannot go to sTrying to adapt to it and tolerate it.   Trouble focusing and memory issues worse.  Gets easily overwhelmed and stimulated.  Will get lost in stores and large areas. H notices.  Episodic cleaning episodes over the top.   Loses train of thought Plan: Stop  mirtazapine  DT NR Increase Seroquel  XR 400 mg and add additional IR Seroquel  500 mg HS  12/22/23 APPT NOTED: with Bill on phone call Med: lamotrigine  150 BID, Quetiapine  ER 400 mg pm and IR 500 mg HS,  Very well .  The med change helped.  Sleep is better with some brief awakening.   The med change very helpful.  Usually asleep in 45 mins.   No SE other than sleepiness.  But pleased with med changes. Not dep.  Not as anxious.  Cooking again and wasn't before.    01/19/24 urgent appt: Bill on call Pt called yesterday complaining of increased distress and hallucinations and paranoia.  Med: lamotrigine  150 BID, Quetiapine  ER 400 mg pm and IR 500 mg HS,  Bill said I was imagining things.  I felt like I was having delusions.  Was emotional yesterday.  Lost 9# in a couple of weeks bc  couldn't eat.  Has been negative real bad.   Bill reports  she thought he was talking to people on the phone.   Was in ER after fall on 01/12/24.  Passed out with dizziness and N and couldn't eat.  Had MRI an CT scan.   Stressed from $ expense.  Bill fell a week before that.  Worried over how we are going to make it; worrying about Bill.  He has leg wound with cellulitis.  Not getting better but is seeing doctor. Drinks plenty of fluids, but only Sprite Zero.   Dizziness resolved off ezetimibe . General fearfulness yesterday, I couldn't stop it.   Complains of seeing things that aren't actually happening; that he's on the phone to other women.  Wake during the night thinking about it.  Can't eat bc can't get that fear out of my mind.  Then says sleeping good.  The fear is so bad.  Had a spell like this in 1983. Plan: Reduce quetiapine  to XR 400 mg pm with quetiapine  100 mg IR HS In it's place retry olanzapine  10 mg HS.  Will try to transition off quetiapine  but needs to be done gradually.  02/22/24 appt noted;  with Bill Med: Lamotrigine  150 mg twice daily, OFF metoprolol  XL 50 mg daily, propranolol  20 mg twice daily PRN Anxiety, olanzapine  15 mg nightly, quetiapine  ER 200 + IR 50-100 mg HS Sleep better. But pain interferes.  Likes to sleep to escape her anxiety.   Been to a lot of doctors for diffuse body pain interfering with sleep. I still have delusions but able to work through them fairly well.  Mind is better but every day is different.  Not 100%.  Can tell a difference between anxiety and depression.   No SE.  Her PCP doesn't agree with her psych meds.   Chronic memory issues.  Trouble doing complicated things like operating cell phone. Had MRI Chronically worries about family and esp about H's health and his future.  No longer excessive cleaning nor other manic sx.   Appreciates her psychiatric care and glad to try something else besides quetiapine .  H agrees she's less anxious and hyper and not generally paranoid.  But still periods of confusion  and pending neuro appt.   Plan: Disc potential hospitalization for paranoia and severe anxiety and difficulty eating.  Will try to prevent it by starting process of switching to olanzapine . Reduce quetiapine  to  IR 150 mg HS and stop XR.  Will try to transition off quetiapine  but needs to be done gradually. Continue olanzapine  15 mg HS  04/18/24 appt noted:  with Bill Med:  Lamotrigine  150 mg twice daily,  propranolol  20 mg twice daily PRN Anxiety, olanzapine  15 mg nightly, quetiapine   IR 100 mg HS Doing fine.  Sleep fine with current dose of meds except arthritic pain interferes with sleep.  Usually sleeps until 1030.   Lately anxiety fine.  But worry about children.  Sometimes intrusive or paranoid thoughts but able to shake it off.   She is doing better with switch to olanzapine .   Ongoing memory problems.  PCP referring her for neurology in October.  Some anxiety about that.   No SE with meds.   Bill agrees better with olanzapine .  Not paranoid.    Prior psychiatric medication trials include  paroxetine , Lexapro, sertraline  50 Sertraline  50 daily.    risperidone  4 mg which was sedating, aripiprazole,  perphenazine,  Seroquel  1000,  olanzapine  15 Lamotrigine  300.  lithium which was not tolerated even at very low dosages.  Oxcarbazepine  150 mg bowel and bladder incontinence and impaired vision. Topamax for anxiety,  She was intolerant of both Namenda and Aricept.    Xanax , Ativan , diazepam  5 mg BID    Propranolol  20 prn helped anxiety  This is not an exhaustive list.   Psych hospitalization 04/2021 at [facility] for psychosis.  At the visit in February 2020 Nadiyah was more acutely confused recently for no clear reason.   She had a negative medical work-up for causes of short-term memory problems and confusion.  Therefore the decision was made to try switching her from alprazolam  to lorazepam  at the lowest possible dose in hopes of improving her cognition.  This was  successful and her cognition was improved significantly and her husband verified this.    [name] seeing Dr. Vicenta D noted sig anxiety benefit with propranolol   Neuropsych testing mid 2019 at [facility] but results not on chart.    Review of Systems:  Review of Systems  Eyes:  Positive for visual disturbance.  Cardiovascular:  Negative for chest pain and palpitations.  Musculoskeletal:  Positive for arthralgias, back pain and gait problem.  Neurological:  Positive for dizziness. Negative for weakness.  Psychiatric/Behavioral:  Positive for confusion and decreased concentration. Negative for agitation, behavioral problems, dysphoric mood, hallucinations, self-injury, sleep disturbance and suicidal ideas. The patient is nervous/anxious. The patient is not hyperactive.     Medications: I have reviewed the patient's current medications.  Current Outpatient Medications  Medication Sig Dispense Refill   acetaminophen  (TYLENOL ) 325 MG tablet Take 650 mg by mouth every 6 (six) hours as needed for mild pain or headache.     amLODipine  (NORVASC ) 5 MG tablet Take 5 mg by mouth every evening.     aspirin  81 MG tablet Take 81 mg by mouth daily.     cephALEXin  (KEFLEX ) 250 MG capsule Take 250 mg by mouth 2 (two) times daily.     famotidine  (PEPCID ) 20 MG tablet Take 20 mg by mouth in the morning and at bedtime.     furosemide  (LASIX ) 20 MG tablet Take 20 mg by mouth daily as needed (for severe swelling).     lamoTRIgine  (LAMICTAL ) 150 MG tablet TAKE 1 TABLET TWICE DAILY 180 tablet 3   levocetirizine (XYZAL ) 5 MG tablet Take 5 mg by mouth every evening.     levothyroxine  (SYNTHROID ) 100 MCG tablet Take 100 mcg by mouth daily before breakfast.     lisinopril  (ZESTRIL ) 20 MG tablet Take 1 tablet (20 mg total) by mouth daily.     meloxicam  (MOBIC ) 15 MG tablet Take 15 mg by mouth daily.     metoprolol  succinate (TOPROL -XL) 50 MG 24 hr tablet Take 50 mg by mouth daily.     Multiple Vitamin (MULTIVITAMIN)  tablet Take 1 tablet by mouth daily.     OLANZapine  (ZYPREXA ) 15 MG tablet TAKE 1 TABLET BY MOUTH EVERYDAY AT BEDTIME 90 tablet 0   polyethylene glycol (MIRALAX  / GLYCOLAX ) packet Take 17 g by mouth 2 (two) times daily as needed for mild constipation.     potassium chloride  SA (K-DUR,KLOR-CON ) 20 MEQ tablet Take 20 mEq by mouth daily.     propranolol  (INDERAL ) 10 MG tablet TAKE 1 TO 2 TABLETS BY MOUTH TWICE A DAY AS NEEDED FOR ANXIETY 360 tablet 0   simvastatin  (ZOCOR ) 40 MG tablet Take 1 tablet (40 mg total) by mouth every evening. 30 tablet 0   tirzepatide (  MOUNJARO) 12.5 MG/0.5ML Pen Inject 12.5 mg into the skin once a week.     ezetimibe  (ZETIA ) 10 MG tablet Take 1 tablet (10 mg total) by mouth daily. 90 tablet 3   QUEtiapine  (SEROQUEL ) 100 MG tablet Take 1 tablet (100 mg total) by mouth at bedtime. 90 tablet 0   No current facility-administered medications for this visit.    Medication Side Effects: None  Allergies:  Allergies  Allergen Reactions   Maxzide [Triamterene-Hctz] Other (See Comments)    Hurt patient's kidneys   Celexa [Citalopram Hydrobromide] Nausea Only   Metformin And Related Nausea And Vomiting   Other Nausea And Vomiting and Other (See Comments)    Unnamed medication hurt the patient's kidneys   Reglan  [Metoclopramide ] Nausea Only   Risperdal  [Risperidone ] Nausea Only and Other (See Comments)    Also caused hallucinations   Trazodone And Nefazodone Nausea Only   Zestril  [Lisinopril ] Nausea Only   Lithium Palpitations and Other (See Comments)    Shakes and delusional pt started seeing things      Past Medical History:  Diagnosis Date   Anxiety    Arthritis    Asthma    Bipolar 1 disorder (HCC)    CHF (congestive heart failure) (HCC)    Complication of anesthesia    left a bad taste in my mouth it has lasted since january    Depression    Diabetes mellitus    Hernia    umbilical   Hyperlipidemia    Hypertension    Hypothyroidism    Pericarditis,  viral 2010 October   Sleep apnea    uses cpap setting of 15    Family History  Problem Relation Age of Onset   Diabetes Mother    Hypertension Mother    Hyperlipidemia Mother    Diabetes Father    Hypertension Father    Hyperlipidemia Father    Hypertension Sister     Social History   Socioeconomic History   Marital status: Married    Spouse name: Not on file   Number of children: Not on file   Years of education: Not on file   Highest education level: Not on file  Occupational History   Not on file  Tobacco Use   Smoking status: Never   Smokeless tobacco: Never  Substance and Sexual Activity   Alcohol use: No   Drug use: No   Sexual activity: Not on file  Other Topics Concern   Not on file  Social History Narrative   Not on file   Social Drivers of Health   Financial Resource Strain: Not on file  Food Insecurity: No Food Insecurity (12/22/2022)   Hunger Vital Sign    Worried About Running Out of Food in the Last Year: Never true    Ran Out of Food in the Last Year: Never true  Transportation Needs: No Transportation Needs (12/22/2022)   PRAPARE - Administrator, Civil Service (Medical): No    Lack of Transportation (Non-Medical): No  Physical Activity: Not on file  Stress: Not on file  Social Connections: Not on file  Intimate Partner Violence: Not At Risk (12/22/2022)   Humiliation, Afraid, Rape, and Kick questionnaire    Fear of Current or Ex-Partner: No    Emotionally Abused: No    Physically Abused: No    Sexually Abused: No    Past Medical History, Surgical history, Social history, and Family history were reviewed and updated as appropriate.   Please  see review of systems for further details on the patient's review from today.   Objective:   Physical Exam:  There were no vitals taken for this visit.  Physical Exam Neurological:     Mental Status: She is alert and oriented to person, place, and time.     Cranial Nerves: No dysarthria.   Psychiatric:        Attention and Perception: Perception normal. She is inattentive.        Mood and Affect: Mood is anxious. Mood is not depressed. Affect is not labile or tearful.        Speech: Speech is not rapid and pressured or slurred.        Behavior: Behavior is cooperative.        Thought Content: Thought content is not paranoid or delusional. Thought content does not include homicidal or suicidal ideation. Thought content does not include suicidal plan.        Cognition and Memory: Cognition normal. Cognition is not impaired. She exhibits impaired recent memory.        Judgment: Judgment normal.     Comments: Insight intact Talkative.   No psychosis noted     Lab Review:     Component Value Date/Time   NA 137 01/05/2024 0728   K 3.6 01/05/2024 0728   CL 108 01/05/2024 0728   CO2 23 01/05/2024 0728   GLUCOSE 94 01/05/2024 0728   GLUCOSE 154 (H) 09/01/2006 1100   BUN 19 01/05/2024 0728   CREATININE 1.07 (H) 01/05/2024 0728   CREATININE 0.93 07/19/2011 1110   CALCIUM 9.9 01/05/2024 0728   PROT 6.8 12/22/2022 0300   ALBUMIN 3.6 12/22/2022 0300   AST 19 12/22/2022 0300   ALT 15 12/22/2022 0300   ALKPHOS 76 12/22/2022 0300   BILITOT 0.7 12/22/2022 0300   GFRNONAA 56 (L) 01/05/2024 0728   GFRAA >60 03/23/2016 1933       Component Value Date/Time   WBC 7.0 01/05/2024 0728   RBC 4.02 01/05/2024 0728   HGB 12.7 01/05/2024 0728   HCT 38.7 01/05/2024 0728   PLT 172 01/05/2024 0728   MCV 96.3 01/05/2024 0728   MCH 31.6 01/05/2024 0728   MCHC 32.8 01/05/2024 0728   RDW 13.6 01/05/2024 0728   LYMPHSABS 1.2 04/15/2021 1831   MONOABS 0.4 04/15/2021 1831   EOSABS 0.1 04/15/2021 1831   BASOSABS 0.1 04/15/2021 1831   Prior lamotrigine  level in 2018 on this dosage was 3.6.  Not particularly high.  Per Dr. Mattie: Clinical Impressions: Cognitive complaints are likely due to underlying psychiatric disorder (bipolar disorder/anxiety disorder with racing  thoughts). Diagnostic impressions based on test performances are limited due to the patient's severe inability to fully attend to the tasks. However, based on clinical presentation, I highly doubt the presence of a neurodegenerative dementia. It is much more likely that her racing thoughts and psychiatric disorders are interfering with cognitive   .res Assessment: Plan:    Bipolar affective disorder, mixed, severe, with psychotic behavior (HCC) - Plan: QUEtiapine  (SEROQUEL ) 100 MG tablet  Generalized anxiety disorder  Insomnia due to mental condition - Plan: QUEtiapine  (SEROQUEL ) 100 MG tablet  Panic disorder with agoraphobia  Mild cognitive impairment  Claustrophobia  Low vitamin D  level   Romero has chronic severe anxiety with panic attacks as well as bipolar disorder with a history of significant lability.  She has had more severe instability with psychotic sx and cognitive problems off and on lately.    No hallucinations  now.  Much less paranoid and anxiety with switch to olanzapine .   She also has mild cognitive impairment as a complicating factor.  She needs her husband's assistance to manage her medications.    Discussed potential metabolic side effects associated with atypical antipsychotics, as well as potential risk for movement side effects. Advised pt to contact office if movement side effects occur.  Disc family's fear of LT SE.  However current meds work and without them sx unmanageable.  Has been able to stop BZ   Continue MVI.    Pt is chronically needy and requires extended appts DT chronic anxiety and easily stressed but much better with sertraline  low dose for anxiety. Cannot drive now and wasn't much before.    No evidence of medical cause for STM px.  No change in memory with switch from Seroquel  to Zyprexa .  FHX dementia including mother.   Pt doesn't feel able to fix her meds by herself.  Gives up quickly on task demands.    Continue lamotrigine  150 mg  BID  Successful switch from Seroquel  to olanzapine  with resolution of paranoia and reduction in anxiety.  Unable to completely stop Seroquel  DT insomnia.  Continue olanzapine  15 mg HS  FU 2 mos  Lorene Macintosh, MD, DFAPA   Future Appointments  Date Time Provider Department Center  05/20/2024  3:00 PM Cottle, Lorene KANDICE Raddle., MD [facility] None  06/07/2024  1:15 PM [facility] NURSE [facility] None  06/07/2024  1:30 PM Dina Camie BRAVO, PA-C [facility] None  06/27/2024  3:15 PM Gershon Donnice SAUNDERS, DPM [facility]       No orders of the defined types were placed in this encounter.      -------------------------------

## 2024-04-19 DIAGNOSIS — R42 Dizziness and giddiness: Secondary | ICD-10-CM | POA: Diagnosis not present

## 2024-04-19 DIAGNOSIS — E1129 Type 2 diabetes mellitus with other diabetic kidney complication: Secondary | ICD-10-CM | POA: Diagnosis not present

## 2024-04-19 DIAGNOSIS — E7800 Pure hypercholesterolemia, unspecified: Secondary | ICD-10-CM | POA: Diagnosis not present

## 2024-04-19 DIAGNOSIS — E039 Hypothyroidism, unspecified: Secondary | ICD-10-CM | POA: Diagnosis not present

## 2024-04-21 DIAGNOSIS — F319 Bipolar disorder, unspecified: Secondary | ICD-10-CM | POA: Diagnosis not present

## 2024-04-21 DIAGNOSIS — I350 Nonrheumatic aortic (valve) stenosis: Secondary | ICD-10-CM | POA: Diagnosis not present

## 2024-04-21 DIAGNOSIS — J45909 Unspecified asthma, uncomplicated: Secondary | ICD-10-CM | POA: Diagnosis not present

## 2024-04-24 DIAGNOSIS — I350 Nonrheumatic aortic (valve) stenosis: Secondary | ICD-10-CM | POA: Diagnosis not present

## 2024-04-24 DIAGNOSIS — Z0000 Encounter for general adult medical examination without abnormal findings: Secondary | ICD-10-CM | POA: Diagnosis not present

## 2024-04-24 DIAGNOSIS — R42 Dizziness and giddiness: Secondary | ICD-10-CM | POA: Diagnosis not present

## 2024-04-24 DIAGNOSIS — I5189 Other ill-defined heart diseases: Secondary | ICD-10-CM | POA: Diagnosis not present

## 2024-04-24 DIAGNOSIS — E1129 Type 2 diabetes mellitus with other diabetic kidney complication: Secondary | ICD-10-CM | POA: Diagnosis not present

## 2024-04-24 DIAGNOSIS — G4730 Sleep apnea, unspecified: Secondary | ICD-10-CM | POA: Diagnosis not present

## 2024-04-24 DIAGNOSIS — E039 Hypothyroidism, unspecified: Secondary | ICD-10-CM | POA: Diagnosis not present

## 2024-04-24 DIAGNOSIS — E7800 Pure hypercholesterolemia, unspecified: Secondary | ICD-10-CM | POA: Diagnosis not present

## 2024-04-26 ENCOUNTER — Other Ambulatory Visit: Payer: Self-pay | Admitting: Family Medicine

## 2024-04-26 DIAGNOSIS — Z1231 Encounter for screening mammogram for malignant neoplasm of breast: Secondary | ICD-10-CM

## 2024-04-30 ENCOUNTER — Other Ambulatory Visit: Payer: Self-pay | Admitting: Family Medicine

## 2024-04-30 DIAGNOSIS — E2839 Other primary ovarian failure: Secondary | ICD-10-CM

## 2024-05-14 DIAGNOSIS — N183 Chronic kidney disease, stage 3 unspecified: Secondary | ICD-10-CM | POA: Diagnosis not present

## 2024-05-14 DIAGNOSIS — E1129 Type 2 diabetes mellitus with other diabetic kidney complication: Secondary | ICD-10-CM | POA: Diagnosis not present

## 2024-05-14 DIAGNOSIS — I350 Nonrheumatic aortic (valve) stenosis: Secondary | ICD-10-CM | POA: Diagnosis not present

## 2024-05-14 DIAGNOSIS — I129 Hypertensive chronic kidney disease with stage 1 through stage 4 chronic kidney disease, or unspecified chronic kidney disease: Secondary | ICD-10-CM | POA: Diagnosis not present

## 2024-05-20 ENCOUNTER — Telehealth: Admitting: Psychiatry

## 2024-05-21 DIAGNOSIS — E1129 Type 2 diabetes mellitus with other diabetic kidney complication: Secondary | ICD-10-CM | POA: Diagnosis not present

## 2024-05-21 DIAGNOSIS — F319 Bipolar disorder, unspecified: Secondary | ICD-10-CM | POA: Diagnosis not present

## 2024-05-21 DIAGNOSIS — I129 Hypertensive chronic kidney disease with stage 1 through stage 4 chronic kidney disease, or unspecified chronic kidney disease: Secondary | ICD-10-CM | POA: Diagnosis not present

## 2024-05-21 DIAGNOSIS — J45909 Unspecified asthma, uncomplicated: Secondary | ICD-10-CM | POA: Diagnosis not present

## 2024-05-21 DIAGNOSIS — N183 Chronic kidney disease, stage 3 unspecified: Secondary | ICD-10-CM | POA: Diagnosis not present

## 2024-05-21 DIAGNOSIS — I350 Nonrheumatic aortic (valve) stenosis: Secondary | ICD-10-CM | POA: Diagnosis not present

## 2024-05-28 DIAGNOSIS — N1831 Chronic kidney disease, stage 3a: Secondary | ICD-10-CM | POA: Diagnosis not present

## 2024-05-28 DIAGNOSIS — I10 Essential (primary) hypertension: Secondary | ICD-10-CM | POA: Diagnosis not present

## 2024-05-28 DIAGNOSIS — Z23 Encounter for immunization: Secondary | ICD-10-CM | POA: Diagnosis not present
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
## 2024-06-07 ENCOUNTER — Ambulatory Visit

## 2024-06-07 ENCOUNTER — Ambulatory Visit: Admitting: Physician Assistant

## 2024-06-07 ENCOUNTER — Encounter: Payer: Self-pay | Admitting: Physician Assistant

## 2024-06-07 VITALS — BP 143/80 | HR 68

## 2024-06-07 DIAGNOSIS — R4189 Other symptoms and signs involving cognitive functions and awareness: Secondary | ICD-10-CM | POA: Diagnosis not present

## 2024-06-07 MED ORDER — DONEPEZIL HCL 5 MG PO TABS: 5.0000 mg | ORAL_TABLET | Freq: Every day | ORAL | 3 refills | Status: DC

## 2024-06-07 NOTE — Patient Instructions (Addendum)
 It was a pleasure to see you today at our office.   Recommendations:   No follow up is indicated  Recommend hearing evaluation to improve comprehension    [email or web address]   RECOMMENDATIONS FOR ALL PATIENTS WITH MEMORY PROBLEMS: 1. Continue to exercise (Recommend 30 minutes of walking everyday, or 3 hours every week) 2. Increase social interactions - continue going to [place] and enjoy social gatherings with friends and family 3. Eat healthy, avoid fried foods and eat more fruits and vegetables 4. Maintain adequate blood pressure, blood sugar, and blood cholesterol level. Reducing the risk of stroke and cardiovascular disease also helps promoting better memory. 5. Avoid stressful situations. Live a simple life and avoid aggravations. Organize your time and prepare for the next day in anticipation. 6. Sleep well, avoid any interruptions of sleep and avoid any distractions in the bedroom that may interfere with adequate sleep quality 7. Avoid sugar, avoid sweets as there is a strong link between excessive sugar intake, diabetes, and cognitive impairment We discussed the Mediterranean diet, which has been shown to help patients reduce the risk of progressive memory disorders and reduces cardiovascular risk. This includes eating fish, eat fruits and green leafy vegetables, nuts like almonds and hazelnuts, walnuts, and also use olive oil. Avoid fast foods and fried foods as much as possible. Avoid sweets and sugar as sugar use has been linked to worsening of memory function.  There is always a concern of gradual progression of memory problems. If this is the case, then we may need to adjust level of care according to patient needs. Support, both to the patient and caregiver, should then be put into place.           DRIVING: Regarding driving, in patients with progressive memory problems, driving will be impaired. We advise to have  someone else do the driving if trouble finding directions or if minor accidents are reported. Independent driving assessment is available to determine safety of driving.   If you are interested in the driving assessment, you can contact the following:  [facility] [employer] [facility] in [place] [phone]  [facility] [phone]  [facility] [phone]  [facility] [phone] or [phone]   FALL PRECAUTIONS: Be cautious when walking. Scan the area for obstacles that may increase the risk of trips and falls. When getting up in the mornings, sit up at the edge of the bed for a few minutes before getting out of bed. Consider elevating the bed at the head end to avoid drop of blood pressure when getting up. Walk always in a well-lit room (use night lights in the walls). Avoid area rugs or power cords from appliances in the middle of the walkways. Use a walker or a cane if necessary and consider physical therapy for balance exercise. Get your eyesight checked regularly.  FINANCIAL OVERSIGHT: Supervision, especially oversight when making financial decisions or transactions is also recommended.  HOME SAFETY: Consider the safety of the kitchen when operating appliances like stoves, microwave oven, and blender. Consider having supervision and share cooking responsibilities until no longer able to participate in those. Accidents with firearms and other hazards in the house should be identified and addressed as well.   ABILITY TO BE LEFT ALONE: If patient is unable to contact 911 operator, consider using LifeLine, or when the need is there, arrange for someone to stay with patients. Smoking is a fire hazard, consider supervision or cessation. Risk of wandering should be assessed by caregiver and if detected at  any point, supervision and safe proof recommendations should be instituted.  MEDICATION SUPERVISION: Inability to self-administer medication needs to be  constantly addressed. Implement a mechanism to ensure safe administration of the medications.      Mediterranean Diet A Mediterranean diet refers to food and lifestyle choices that are based on the traditions of countries located on the [employer] [place]. This way of eating has been shown to help prevent certain conditions and improve outcomes for people who have chronic diseases, like kidney disease and heart disease. What are tips for following this plan? Lifestyle  Cook and eat meals together with your family, when possible. Drink enough fluid to keep your urine clear or pale yellow. Be physically active every day. This includes: Aerobic exercise like running or swimming. Leisure activities like gardening, walking, or housework. Get 7-8 hours of sleep each night. If recommended by your health care provider, drink red wine in moderation. This means 1 glass a day for nonpregnant women and 2 glasses a day for men. A glass of wine equals 5 oz (150 mL). Reading food labels  Check the serving size of packaged foods. For foods such as rice and pasta, the serving size refers to the amount of cooked product, not dry. Check the total fat in packaged foods. Avoid foods that have saturated fat or trans fats. Check the ingredients list for added sugars, such as corn syrup. Shopping  At the grocery store, buy most of your food from the areas near the walls of the store. This includes: Fresh fruits and vegetables (produce). Grains, beans, nuts, and seeds. Some of these may be available in unpackaged forms or large amounts (in bulk). Fresh seafood. Poultry and eggs. Low-fat dairy products. Buy whole ingredients instead of prepackaged foods. Buy fresh fruits and vegetables in-season from local farmers markets. Buy frozen fruits and vegetables in resealable bags. If you do not have access to quality fresh seafood, buy precooked frozen shrimp or canned fish, such as tuna, salmon, or sardines. Buy  small amounts of raw or cooked vegetables, salads, or olives from the deli or salad bar at your store. Stock your pantry so you always have certain foods on hand, such as olive oil, canned tuna, canned tomatoes, rice, pasta, and beans. Cooking  Cook foods with extra-virgin olive oil instead of using butter or other vegetable oils. Have meat as a side dish, and have vegetables or grains as your main dish. This means having meat in small portions or adding small amounts of meat to foods like pasta or stew. Use beans or vegetables instead of meat in common dishes like chili or lasagna. Experiment with different cooking methods. Try roasting or broiling vegetables instead of steaming or sauteing them. Add frozen vegetables to soups, stews, pasta, or rice. Add nuts or seeds for added healthy fat at each meal. You can add these to yogurt, salads, or vegetable dishes. Marinate fish or vegetables using olive oil, lemon juice, garlic, and fresh herbs. Meal planning  Plan to eat 1 vegetarian meal one day each week. Try to work up to 2 vegetarian meals, if possible. Eat seafood 2 or more times a week. Have healthy snacks readily available, such as: Vegetable sticks with hummus. Greek yogurt. Fruit and nut trail mix. Eat balanced meals throughout the week. This includes: Fruit: 2-3 servings a day Vegetables: 4-5 servings a day Low-fat dairy: 2 servings a day Fish, poultry, or lean meat: 1 serving a day Beans and legumes: 2 or more servings a week  Nuts and seeds: 1-2 servings a day Whole grains: 6-8 servings a day Extra-virgin olive oil: 3-4 servings a day Limit red meat and sweets to only a few servings a month What are my food choices? Mediterranean diet Recommended Grains: Whole-grain pasta. Brown rice. Bulgar wheat. Polenta. Couscous. Whole-wheat bread. [place] Made[place]. Vegetables: Artichokes. Beets. Broccoli. Cabbage. Carrots. Eggplant. Green beans. Chard. Kale. Spinach. Onions. Leeks. Peas.  Squash. Tomatoes. Peppers. Radishes. Fruits: Apples. Apricots. Avocado. Berries. Bananas. Cherries. Dates. Figs. Grapes. Lemons. Melon. Oranges. Peaches. Plums. Pomegranate. Meats and other protein foods: Beans. Almonds. Sunflower seeds. Pine nuts. Peanuts. Cod. Salmon. Scallops. Shrimp. Tuna. Tilapia. Clams. Oysters. Eggs. Dairy: Low-fat milk. Cheese. Greek yogurt. Beverages: Water. Red wine. Herbal tea. Fats and oils: Extra virgin olive oil. Avocado oil. Grape seed oil. Sweets and desserts: [place] yogurt with honey. Baked apples. Poached pears. Trail mix. Seasoning and other foods: Basil. Cilantro. Coriander. Cumin. Mint. Parsley. Sage. Rosemary. Tarragon. Garlic. Oregano. Thyme. Pepper. Balsalmic vinegar. Tahini. Hummus. Tomato sauce. Olives. Mushrooms. Limit these Grains: Prepackaged pasta or rice dishes. Prepackaged cereal with added sugar. Vegetables: Deep fried potatoes (french fries). Fruits: Fruit canned in syrup. Meats and other protein foods: Beef. Pork. Lamb. Poultry with skin. Hot dogs. [place]. Dairy: Ice cream. Sour cream. Whole milk. Beverages: Juice. Sugar-sweetened soft drinks. Beer. Liquor and spirits. Fats and oils: Butter. Canola oil. Vegetable oil. Beef fat (tallow). Lard. Sweets and desserts: Cookies. Cakes. Pies. Candy. Seasoning and other foods: Mayonnaise. Premade sauces and marinades. The items listed may not be a complete list. Talk with your dietitian about what dietary choices are right for you. Summary The Mediterranean diet includes both food and lifestyle choices. Eat a variety of fresh fruits and vegetables, beans, nuts, seeds, and whole grains. Limit the amount of red meat and sweets that you eat. Talk with your health care provider about whether it is safe for you to drink red wine in moderation. This means 1 glass a day for nonpregnant women and 2 glasses a day for men. A glass of wine equals 5 oz (150 mL). This information is not intended to replace advice  given to you by your health care provider. Make sure you discuss any questions you have with your health care provider. Document Released: 03/31/2016 Document Revised: 05/03/2016 Document Reviewed: 03/31/2016 Elsevier Interactive Patient Education  2017 [employer].

## 2024-06-13 DIAGNOSIS — I350 Nonrheumatic aortic (valve) stenosis: Secondary | ICD-10-CM | POA: Diagnosis not present

## 2024-06-13 DIAGNOSIS — N183 Chronic kidney disease, stage 3 unspecified: Secondary | ICD-10-CM | POA: Diagnosis not present

## 2024-06-13 DIAGNOSIS — I129 Hypertensive chronic kidney disease with stage 1 through stage 4 chronic kidney disease, or unspecified chronic kidney disease: Secondary | ICD-10-CM | POA: Diagnosis not present

## 2024-06-13 DIAGNOSIS — E1129 Type 2 diabetes mellitus with other diabetic kidney complication: Secondary | ICD-10-CM | POA: Diagnosis not present

## 2024-06-20 ENCOUNTER — Ambulatory Visit (INDEPENDENT_AMBULATORY_CARE_PROVIDER_SITE_OTHER): Admitting: Psychiatry

## 2024-06-20 ENCOUNTER — Encounter: Payer: Self-pay | Admitting: Psychiatry

## 2024-06-20 DIAGNOSIS — F5105 Insomnia due to other mental disorder: Secondary | ICD-10-CM

## 2024-06-20 DIAGNOSIS — F40240 Claustrophobia: Secondary | ICD-10-CM | POA: Diagnosis not present

## 2024-06-20 DIAGNOSIS — G3184 Mild cognitive impairment, so stated: Secondary | ICD-10-CM | POA: Diagnosis not present

## 2024-06-20 DIAGNOSIS — F4001 Agoraphobia with panic disorder: Secondary | ICD-10-CM | POA: Diagnosis not present

## 2024-06-20 DIAGNOSIS — F3164 Bipolar disorder, current episode mixed, severe, with psychotic features: Secondary | ICD-10-CM

## 2024-06-20 DIAGNOSIS — R7989 Other specified abnormal findings of blood chemistry: Secondary | ICD-10-CM | POA: Diagnosis not present

## 2024-06-20 DIAGNOSIS — F411 Generalized anxiety disorder: Secondary | ICD-10-CM | POA: Diagnosis not present

## 2024-06-20 MED ORDER — OLANZAPINE 10 MG PO TABS: 10.0000 mg | ORAL_TABLET | Freq: Every day | ORAL | 0 refills | Status: DC

## 2024-06-20 MED ORDER — QUETIAPINE FUMARATE 50 MG PO TABS: ORAL_TABLET | ORAL | 0 refills | Status: DC

## 2024-06-20 NOTE — Progress Notes (Signed)
 [name] [id number] May 07, 1951 73 y.o.   Virtual Visit via Telephone Note  I connected with pt by telephone and verified that I am speaking with the correct person using two identifiers.   I discussed the limitations, risks, security and privacy concerns of performing an evaluation and management service by telephone and the availability of in person appointments. I also discussed with the patient that there may be a patient responsible charge related to this service. The patient expressed understanding and agreed to proceed.  I discussed the assessment and treatment plan with the patient. The patient was provided an opportunity to ask questions and all were answered. The patient agreed with the plan and demonstrated an understanding of the instructions.   The patient was advised to call back or seek an in-person evaluation if the symptoms worsen or if the condition fails to improve as anticipated.  I provided 30 minutes of non-face-to-face time during this encounter. The call started at 315 and ended at 99. The patient was located at home and the provider was located office.   Subjective:   Patient ID:  [name] is a 73 y.o. (DOB August 08, 1951) female.  Chief Complaint:  Chief Complaint  Patient presents with   Follow-up   Depression   Anxiety   Memory Loss    [name] presents to the office today for follow-up of anxiety and depression and poor cognition.  At her visit October 12, 2018.  Because of her poor cognition which did seem to get even worse lately with her memory and given a negative unremarkable medical work-up by her primary care doctor the decision was made to change her from Xanax  which she has been on for years to lorazepam .  Lorazepam  often has less cognitive side effects than does alprazolam .  She had a lot of difficulty with headaches and insomnia with the transition.  She called several times in that transition.  Her husband reported that her  cognition was better after the transition however.  She was satisfied with the Ativan .  There was an attempt to reduce her lamotrigine  to 150 mg also in hopes of improving cognition but it was uncertain at the last visit where there she had actually done so.  visit December 2020 without med changes.  She had continued to do cognitively better off alprazolam  and on lorazepam .  seen March 2021.   Better than December visit.  Feeling good and less down.  Adjusting time of med helped excessively sleep.  Brief hypomanic episode resolved where she cleaned all night.  Sleep 10 hours . No med changes.  12/23/19 appt.  Noted: More depression, crying and racing thoughts and distressed to the point of wondering if needed the hospital.  Sleep got worse and needed Seroquel  IR 200 also bc went 2 days without sleep.  It worked and helped sleep.  Worry a lot and melancholy this week.  Cries over what happens dealing with her daugher.[name] has prostate and kidney cancer again.  Had surgery June 10, 2019 and she's cried a lot.  He's incontinent and very uncomfortable.  He usually has positive attitude.   Worry over his health and how she'll do if something happens to him.  She's afraid of Covid.  Trusting in God usually.  She realizes she  Needs to drive occasionally bc of his health.  [name]'s father had prostate CA and lived to 90 yo.  Uncle died of it.  Not markedly depressed. Can't walk much dT  plantar fascitis. Also stressed over [name] 73 yo not doing school work and causing problems.  Her father [name] is a bad parent and not helpful.  Stressed over [employer] and downs too and they have periodic conflict.   More down and anxious.   Guilt tendency.   Patient reports difficulty with sleep initiation or maintenance in part DT anxiety and racing thoughts.Intermittent sleep problems with days and nights mixed up.  Not napping. Not manic.Yolanda Denies appetite disturbance.  Patient reports that energy and motivation have  been good.  Patient has some difficulty with concentration.  Chronic memory problems.   Patient denies any suicidal ideation. Plan:   No changes in meds indicated except agree to take the extra Seroquel  200 IR HS for mixed manic sx that are worse right now.  May be having mixed seasonal sx.  02/20/2020 appointment with the following noted: Doing fairly good except chronic worry over [name].  Some days cries a lot over it.  D having a hard time lately.  Still easily stressed but not more than usual. Stopped extra quetiapine  200 mg HS bc no longer needed it. Anxiety is chronic but at baseline as is depression and not manic now. H helps her take the meds.   Memory is still bad but has been worse when on Xanax  instead fo Ativan .  Sleep is fine from 10 to 9 which is much better than in the past.  Occ spells of staying up for 2 days and then needs to take quetiapine .   H [name] goes for FU soon for cancer check up. Plan: no med changes  05/21/20 appt with following noted: A lot of problems.  Can't drive bc fear and anxiety.  Needed to drive H and couldn't. Fall off toilet twice. Dizzy and HA 2 days ago with a lot of crying and stress with daughter. Leaving home even coming here causes anxiety.    Cataract surgery twice. Fear of [name] being diagnosed with recurrent cancer. Chronic anxiety greather than depression.   Don't know what to do with daughter. Sleep, appetite and energy are OK. Tolerating meds. Sees GD [name] one day weekly. H helps with meds bc she's forgetful and easily confused.  Loses train of thoughts. Dog has separation anxiety and so they don't go out much.  Plan: no med changes  08/18/20 appt with following noted: 2 ER visitis with vertigo.  RX diazepam  and meclizine . Rarely takes former but takes latter regularly. Shakes so bad it made her cry and scream.  Started PT. Continues lorazepam  low dose 0.5 mg AM and 1.0 mg HS.  Still has dizziness and vertigo. Memory is still bad  and H administers meds with pill box. Chronic anxiety.  Depression manageable. Sleep OK and tolerates meds otherwise. [name] driving her crazy. Plan: no med changes  11/17/20 appt with following noted: Has had periods of vertigo and meds for it.  Then had manic sx with cleaning and couldn't sleep for 24 hours. Stopped quetiapine  200 and continued quetiapine  XR 800. Also started diazepam  5 mg BID for vertigo and continued lorazepam  for anxiety.  No meclizine  now.  Vertigo stopped and manic sx stopped.  Tolerating meds now. Still feels [name] is mean to her too often and she will cry over it. [name]'s ExH [name] in jail and is the father of [name]'s D [name] 55 yo.  Will be there for 3 mos. Not markedly depressed.  But hates herself some days.  Still memory problems  about the same.  Sleeping OK now.  Periods of mood swings. Easily anxious with relatives visiting.   Plan: Mixed manic sx resolved for now. No med changes today.  02/17/2021 appointment with the following noted: Not good.  A whole lof of everything.  Shaking for mos. It comes and goes.  Shaking at times makes her think she'll have a nervous breakdown.   Went to ER and dx vertigo.  Has tried meclizine  and Valium  but scared to take it.  Big shakes again today.  Memory is not good. Sleep to escape anxiety.  Scared to leave home. Not sure what brings on the spells of shaking. Today sx hit her as soon as she awoke.  Plan: No med changes  03/12/2021 phone call: Patient with a lot of anxiety.  Husband reported the patient had been hallucinating and was paranoid that he had left her.  Some confusion over identity of people that were around her.  It was suggested that she have medical work-up because it sounded like she had delirium and may have a UTI causing that. 03/17/2021 psychiatric hospitalization. 03/22/2021 MD response:  Note Patient has been very unstable with bipolar disorder mixed symptoms lately also with some delirium of unknown cause.   She will be seeing her primary care physician tomorrow.  Due to the severity of her symptoms it is suggested that she add haloperidol  2 mg nightly to her current quetiapine  800 mg nightly since we cannot increase the quetiapine  dose further.  This may help with her confusion and agitation.  It is okay to talk with her husband because he administers her medication and because she is too confused to understand the instructions.     04/16/21 H called with following:  Pt.'s husband called.  She is currently in [facility] waiting for an inpatient pyschiatric bed.  Pt advised her husband that Dr Geoffry told her to stop taking the Lorazepam .  He said he is not aware of that chang in her prescription.  He would like someone to call him and advise if there were any changes to her meds so he stays aware and can let the hospital know if needed. Husband was informed we had not made any medication changes and would not have suggested she abruptly stopped lorazepam  which could cause withdrawal and other kinds of problems.  05/11/2021 appointment with the following noted:  spoke with H for info At hospital was taken off Ativan , buspirone , and Seroquel  reduced to 500 mg HS (as 1 of XR 400 mg  and 1/2 of 200 mg IR quetiapine ).  Had sedative SE at 200 mg queiapinte !R.  Off risperidone  and haloperidol .   Hospitalized at [facility] for 9 days.  They didn't feel communication was good with them. But she is better now than she was.  Had been psychotic PTH.  Last 2-3 days getting more hyper and can't wind down and hard time going to sleep.  Came home 05/03/21. She didn't like the restrictions at the hospital.  No panic lately and wants something to help her sleep. Plan; Mixed manic sx including psychosis recently but not psychotic now but insomnia.. Will get more psychotic if she doesn't sleep but apparently didn't tolerate Seroquel  IR 200 mg HS well so will increase to quetiapine  IR 150 mg HS and XR 400 mg  nightly. Continue lamotrigine .    06/11/21 appt:  H [name]. H thinks she's doing goood. Doing very fine.  No trouble sleeping.  Sometimes in  morning feels sad bc she knows she will have racing thoughts, but it resolves after a little while.  Racing thoughts since hospital.  Worries over money chronically. To bed 1015-7:30.  Maybe longer which is normal. H administers meds.  Some wordfinding problems.  Does journal which helps. No SE with meds.  Yesterday was hard with anxiety but today is better.  On lamotrigine  150 BID, SERoquel  XR 400 + Seroquel  IR 150 mg HS. Plan: Much better but not fully resolved.  Mixed manic sx including psychosis recently but not psychotic but insomnia last visit resolved with the following... apparently didn't tolerate Seroquel  IR 200 mg HS well so increased quetiapine  IR 150 mg HS and XR 400 mg nightly and now she is sleeping but still having some racing thoughts. Continue lamotrigine  150 BID.    07/14/2021 appointment with the following noted: Bad day yesterday feeling very depressed without anxiety. Not sleeping well lately.  Thinks it's related to less Seroquel  than in the past.  Some EMA.  Always normal slept a lot. Compulsive cleaning and everything in it's place.  Arranges soap dispensers. Cleaning in the middle of the night. No recent psychotic sx but still racing thoughts and chronic $ worries. Still afraid to leave home.  09/20/2021 appt noted: Great overall.  Occ insomnia with EMA.  Still worry but tries not let it consume me.  Still agoraphobia and hates to go out to dentists and doctors.  Challenge to go to dentist.  Can still dread things. Brief nap in daytimes. No SE H administers meds. Seroquel  300 mg HS, XR 400 mg HS.  Lamotrigine  150 Patient reports stable mood and denies depressed or irritable moods.   Patient denies difficulty with sleep initiation or maintenance. Denies appetite disturbance.  Patient reports that energy and motivation have been  good.  Patient denies any difficulty with concentration.  Patient denies any suicidal ideation.  01/20/22 appt noted: also talked with [name] Doesn't like waiting 4 mos between appts. Was manic before increasing Seroquel  with irritability, not sleeping a couple of days and excessive cleaning. Seroquel  increased to IR 400 mg HS.  No SE I feel much Better now.  Doing better at time with anxiety.   Chronic worry over D and GD. GD with psych problems. Sleeping good again. Racing thoughts are better but not gone Tolerating meds.   No further changes desired.   Plan: Seroquel  IR 400 mg HS well and XR 400 mg nightly abc recent manic sx better with increase dose. Continue lamotrigine  150 mg twice daily  04/28/2022 appointment noted: Trouble sleeping without extra 100 mg quetiapine  HS and takes XR 400 mg and IR 300 mg at 730 pm.  Otherwise will be up until 3 AM.  Sleep from 11 until 830.  Good unless anxious. Doing fairly well except 2 weeks bad spell from July 31 related to [name] receiving call from woman he worked with 8 years ago and triggered fear ans suspiciousness and insecurity and overwhelmed her.  She cried and over reacted.  Rough 2 weeks but otherwise ok. Real worried about [name] who is self cutting.  Seeing therapist and psychiatrist.  Her father is terrible and inattentive.  She is 73 yo and never happy. I still have agoraphobia and doesn't want to go ut places.  Her dog also has severe anxiety and doesn't like to be alone too.  Still fearul about money.  08/03/22 appt noted: Doing pretty well overall.  Chronic worry over money.  Met old friend  on Facebook.  She would contact [name] a lot.  Was making her sad.  Would cry every time the woman called.  ,This was wearing her out bc woman was hyperverbal.  Cut the woman off.  Since stopped talking to her then no longer crying. When starts to get dark then racing thoughts.  Driving me crazy.  Chronic worry .  Chronic memorhy problems and tendency to be  obsessed over fear of dementia. Irritable and confused.  Might nap 30 min and rest her mind and awakens feeling better. Continues the same meds: lamotrigine  and quetiapine . Gained 20# last year. Plan: Have pushed Seroquel   as far as possible so start another mood stabilizer Trileptal  and increase to 10 mg AM and 300 mg pM  08/09/2022 phone call: She took oxcarbazepine  150 mg Friday and Saturday and developed severe abdominal pain, bowel and bladder incontinence, and impaired vision.  She skipped it on Sunday and symptoms resolved.  She took it again yesterday and the side effects returned.  States it did help with the racing thoughts but she is unable to tolerate it.  That was just on half of oxcarbazepine  tablet. MD response: DC oxcarbazepine  due to side effects.  10/17/22 appt noted: Started Monjauro today. Still racing thoughts and cleaning a lot.  More anxiety than depression.  Rough road.  Worries about everything.   Thoughts jump from one thing to another.  Can't get away from it.  Can't focus.  Hard to take instruction bc of this.    12/20/22 appt noted: Not good.  Kidney stone. More anxiety last couple of mos. Get something on her mind and she cannot get it off.  Obsesses on things that don't matter.  Trouble going to sleep.  Couldn't go into store the other day dT panic and agoraphobia.   Occ dep but nothing to worry about.  More panic.   Lost 15 # on Monjaro.   Plan start sertraline  for anxiety  03/03/23 appt noted: TC Been hosp for kidney px kidney stone.  Glad to be over it.  .   Lost 42 # since April 1 with Monjauro.  Eating better.   Anxiety is better with sertraline  50 mg marked benefit with very little anxiety.  Much happier and less crying.  Better acceptance and self talk better than ever before.   Still some dep which interferes with function at times but not as severe. I'm doing ok.   Tolerating psych meds without problems.  05/03/23 appt noted:  Psych med: lamotrigine   150 BID, Quetiapine  ER 400 mg pm and IR 300-400 mg HS, sertraline  50 for anxiety. Real sleepy today after taking quetiapine  200 mg , more than usual.  Only takes it prn.  Usually getting enough sleep.  But sometimes keyed up and can't go to sleep.   Been feeling good overall.  Occ bad days.  For the most part doing fine.   Does thrifting which lifts her spirits about 1 time weekly.   It takes her mind off worries.   No marked dep and anxiety is manageable. Lost 55#.   Doing fine with [name].   [name] 73 yo.  Quiet girl. No med change desired.  07/05/23 appt noted:  In person  Had episode of anxiety with severe worry and obsessing over money at times and bad  episode a couple of weeks ago. Lost 63# on Monjaro.  SE hair thinning.  Brittle nails. Can report many potential SE.  Better mobility now.  Very  nice emotionally.   Now 212# and goal is 199#.   Blood sugar runs in 70's fasting.   No insulin .  Exercising too.   No sig dep.  I don't have dep like I did years ago but have bad anxiety.  Still can't drive with anxiety and memory px.   Sleep at night is better.  STM is so bad.   H prepares meds.  Forgets how to do tasks at times.   Terrible anxiety.   Psych med: lamotrigine  150 BID, Quetiapine  ER 400 mg pm and IR 300-400 mg HS, sertraline  50 for anxiety. Uses routine to help with memory.   Plan: Trial low dose propranolol  10-20 mg BID prn anxiety bc it helped D's anxiety.    09/06/23 apppt noted: Psych med: lamotrigine  150 BID, Quetiapine  ER 400 mg pm and IR 300-400 mg HS, sertraline  50 for anxiety. Tried propranolol  for anxiety up to 20 BID.  It does help some.  Will get stubborn and not take it.   Had a lot anxiety and trouble sleeping.  When bad I clean like the devil.  Only 1 hour sleep last night.    Hard to learn new things since childhood like technology.  Can't use tablet without his help.   Last night took extra 100 mg Seroquel  and didn't help. No trigger for more anxiety,  tension, scared.  Even real nervous before MD called.   Lost 62#  on Mounjaro.  Feels better physically.   Some people tell her she has dementia.    Worried Seroquel  might cause that.  No clear pcpt bc long term memory problems.  Can't deal with thinking I might have dementia.   Chronic anxiety since childhood.   Has a friend who calls and a lot and chronically is negative and complains all the time.  Sleep worse for 2-3 weeks.   Plan: DC sertraline  and start mirtazapine  15 mg HS for sleep and anxiety  11/15/23 appt noted:  H [name] on phone call Psych med: lamotrigine  150 BID, Quetiapine  ER 400 mg pm and IR 300-400 mg HS, mirtazapine  15 HS Tried propranolol  for anxiety up to 20 BID.  Having dep and anxiety bad.  Sleeping is terrrible.  Went up to 3 days without sleep.  About 2 weeks. No trigger.  Cannot go to sTrying to adapt to it and tolerate it.   Trouble focusing and memory issues worse.  Gets easily overwhelmed and stimulated.  Will get lost in stores and large areas. H notices.  Episodic cleaning episodes over the top.   Loses train of thought Plan: Stop  mirtazapine  DT NR Increase Seroquel  XR 400 mg and add additional IR Seroquel  500 mg HS  12/22/23 APPT NOTED: with [name] on phone call Med: lamotrigine  150 BID, Quetiapine  ER 400 mg pm and IR 500 mg HS,  Very well .  The med change helped.  Sleep is better with some brief awakening.   The med change very helpful.  Usually asleep in 45 mins.   No SE other than sleepiness.  But pleased with med changes. Not dep.  Not as anxious.  Cooking again and wasn't before.    01/19/24 urgent appt: [name] on call Pt called yesterday complaining of increased distress and hallucinations and paranoia.  Med: lamotrigine  150 BID, Quetiapine  ER 400 mg pm and IR 500 mg HS,  [name] said I was imagining things.  I felt like I was having delusions.  Was emotional yesterday.  Lost 9# in a  couple of weeks bc couldn't eat.  Has been negative real bad.   [name] reports  she thought he was talking to people on the phone.   Was in ER after fall on 01/12/24.  Passed out with dizziness and N and couldn't eat.  Had MRI an CT scan.   Stressed from $ expense.  [name] fell a week before that.  Worried over how we are going to make it; worrying about [name].  He has leg wound with cellulitis.  Not getting better but is seeing doctor. Drinks plenty of fluids, but only Sprite Zero.   Dizziness resolved off ezetimibe . General fearfulness yesterday, I couldn't stop it.   Complains of seeing things that aren't actually happening; that he's on the phone to other women.  Wake during the night thinking about it.  Can't eat bc can't get that fear out of my mind.  Then says sleeping good.  The fear is so bad.  Had a spell like this in 1983. Plan: Reduce quetiapine  to XR 400 mg pm with quetiapine  100 mg IR HS In it's place retry olanzapine  10 mg HS.  Will try to transition off quetiapine  but needs to be done gradually.  02/22/24 appt noted;  with [name] Med: Lamotrigine  150 mg twice daily, OFF metoprolol  XL 50 mg daily, propranolol  20 mg twice daily PRN Anxiety, olanzapine  15 mg nightly, quetiapine  ER 200 + IR 50-100 mg HS Sleep better. But pain interferes.  Likes to sleep to escape her anxiety.   Been to a lot of doctors for diffuse body pain interfering with sleep. I still have delusions but able to work through them fairly well.  Mind is better but every day is different.  Not 100%.  Can tell a difference between anxiety and depression.   No SE.  Her PCP doesn't agree with her psych meds.   Chronic memory issues.  Trouble doing complicated things like operating cell phone. Had MRI Chronically worries about family and esp about H's health and his future.  No longer excessive cleaning nor other manic sx.   Appreciates her psychiatric care and glad to try something else besides quetiapine .  H agrees she's less anxious and hyper and not generally paranoid.  But still periods of confusion  and pending neuro appt.   Plan: Disc potential hospitalization for paranoia and severe anxiety and difficulty eating.  Will try to prevent it by starting process of switching to olanzapine . Reduce quetiapine  to  IR 150 mg HS and stop XR.  Will try to transition off quetiapine  but needs to be done gradually. Continue olanzapine  15 mg HS  04/18/24 appt noted:  with [name] Med:  Lamotrigine  150 mg twice daily,  propranolol  20 mg twice daily PRN Anxiety, olanzapine  15 mg nightly, quetiapine   IR 100 mg HS Doing fine.  Sleep fine with current dose of meds except arthritic pain interferes with sleep.  Usually sleeps until 1030.   Lately anxiety fine.  But worry about children.  Sometimes intrusive or paranoid thoughts but able to shake it off.   She is doing better with switch to olanzapine .   Ongoing memory problems.  PCP referring her for neurology in October.  Some anxiety about that.   No SE with meds.   [name] agrees better with olanzapine .  Not paranoid.   Plan: Continue lamotrigine  150 mg BID Successful switch from Seroquel  to olanzapine  with resolution of paranoia and reduction in anxiety.  Unable to completely stop Seroquel  DT insomnia.  Continue olanzapine   15 mg HS  06/20/24 appt noted: with [name] Med:  Lamotrigine  150 mg twice daily,  propranolol  20 mg twice daily PRN Anxiety, olanzapine  15 mg nightly, quetiapine   IR 100 mg HS Didn't work out bc too much of quetiapine  100 mg DT sedating.  Usually doing ok with quetiapine  50 mg HS.   No SE with 50 mg quetiapine .  PCP Dr. Leonel wants her to see neurologist to see if she has dementia.   She RX donepezil 5 mg and H concerned bc pt already has N and diarrhea and the med can make it worse.   I have good days and bad days but nothing major is different .  Same old stuff.   RLS is ongoing and affects her sleep.  Been a problems the last couple of days.  Avg 5 days per week.   If takes 2 Seroquel  then RLS is worse.  Sometimes will dread going to  sleep.  Used to take Seroquel  without RLS.  No sig leg cramps.     Prior psychiatric medication trials include  paroxetine , Lexapro, sertraline  50 Sertraline  50 daily.    risperidone  4 mg which was sedating, aripiprazole,  perphenazine,  Seroquel  1000,  olanzapine  15 Lamotrigine  300.    lithium which was not tolerated even at very low dosages.  Oxcarbazepine  150 mg bowel and bladder incontinence and impaired vision. Topamax for anxiety,  She was intolerant of both Namenda and Aricept.    Xanax , Ativan , diazepam  5 mg BID    Propranolol  20 prn helped anxiety  This is not an exhaustive list.   Psych hospitalization 04/2021 at [facility] for psychosis.  At the visit in February 2020 [name] was more acutely confused recently for no clear reason.   She had a negative medical work-up for causes of short-term memory problems and confusion.  Therefore the decision was made to try switching her from alprazolam  to lorazepam  at the lowest possible dose in hopes of improving her cognition.  This was successful and her cognition was improved significantly and her husband verified this.    [name] seeing Dr. Vicenta D noted sig anxiety benefit with propranolol   Neuropsych testing mid 2019 at [facility] but results not on chart.    Review of Systems:  Review of Systems  Eyes:  Positive for visual disturbance.  Cardiovascular:  Negative for chest pain and palpitations.  Gastrointestinal:  Positive for constipation and diarrhea.  Musculoskeletal:  Positive for arthralgias, back pain and gait problem.  Neurological:  Positive for dizziness. Negative for weakness.  Psychiatric/Behavioral:  Positive for confusion and decreased concentration. Negative for agitation, behavioral problems, dysphoric mood, hallucinations, self-injury, sleep disturbance and suicidal ideas. The patient is nervous/anxious. The patient is not hyperactive.     Medications: I have reviewed the patient's current  medications.  Current Outpatient Medications  Medication Sig Dispense Refill   acetaminophen  (TYLENOL ) 325 MG tablet Take 650 mg by mouth every 6 (six) hours as needed for mild pain or headache.     amLODipine  (NORVASC ) 5 MG tablet Take 5 mg by mouth every evening.     aspirin  81 MG tablet Take 81 mg by mouth daily.     cephALEXin  (KEFLEX ) 250 MG capsule Take 250 mg by mouth 2 (two) times daily.     famotidine  (PEPCID ) 20 MG tablet Take 20 mg by mouth in the morning and at bedtime.     furosemide  (LASIX ) 20 MG tablet Take 20 mg by mouth daily as needed (for severe  swelling).     lamoTRIgine  (LAMICTAL ) 150 MG tablet TAKE 1 TABLET TWICE DAILY 180 tablet 3   levocetirizine (XYZAL ) 5 MG tablet Take 5 mg by mouth every evening.     levothyroxine  (SYNTHROID ) 100 MCG tablet Take 100 mcg by mouth daily before breakfast.     lisinopril  (ZESTRIL ) 20 MG tablet Take 1 tablet (20 mg total) by mouth daily.     meloxicam  (MOBIC ) 15 MG tablet Take 15 mg by mouth daily.     metoprolol  succinate (TOPROL -XL) 50 MG 24 hr tablet Take 50 mg by mouth daily.     Multiple Vitamin (MULTIVITAMIN) tablet Take 1 tablet by mouth daily.     polyethylene glycol (MIRALAX  / GLYCOLAX ) packet Take 17 g by mouth 2 (two) times daily as needed for mild constipation.     potassium chloride  SA (K-DUR,KLOR-CON ) 20 MEQ tablet Take 20 mEq by mouth daily.     propranolol  (INDERAL ) 10 MG tablet TAKE 1 TO 2 TABLETS BY MOUTH TWICE A DAY AS NEEDED FOR ANXIETY 360 tablet 0   simvastatin  (ZOCOR ) 40 MG tablet Take 1 tablet (40 mg total) by mouth every evening. 30 tablet 0   tirzepatide (MOUNJARO) 12.5 MG/0.5ML Pen Inject 12.5 mg into the skin once a week.     ezetimibe  (ZETIA ) 10 MG tablet Take 1 tablet (10 mg total) by mouth daily. (Patient not taking: Reported on 06/07/2024) 90 tablet 3   OLANZapine  (ZYPREXA ) 10 MG tablet Take 1 tablet (10 mg total) by mouth at bedtime. 90 tablet 0   QUEtiapine  (SEROQUEL ) 50 MG tablet 1-2 tablets at night as  needed for severe insomnia 100 tablet 0   No current facility-administered medications for this visit.    Medication Side Effects: None  Allergies:  Allergies  Allergen Reactions   Maxzide [Triamterene-Hctz] Other (See Comments)    Hurt patient's kidneys   Celexa [Citalopram Hydrobromide] Nausea Only   Metformin And Related Nausea And Vomiting   Other Nausea And Vomiting and Other (See Comments)    Unnamed medication hurt the patient's kidneys   Reglan  [Metoclopramide ] Nausea Only   Risperdal  [Risperidone ] Nausea Only and Other (See Comments)    Also caused hallucinations   Trazodone And Nefazodone Nausea Only   Zestril  [Lisinopril ] Nausea Only   Lithium Palpitations and Other (See Comments)    Shakes and delusional pt started seeing things      Past Medical History:  Diagnosis Date   Anxiety    Arthritis    Asthma    Bipolar 1 disorder (HCC)    CHF (congestive heart failure) (HCC)    Complication of anesthesia    left a bad taste in my mouth it has lasted since january    Depression    Diabetes mellitus    Hernia    umbilical   Hyperlipidemia    Hypertension    Hypothyroidism    Pericarditis, viral 2010 October   Sleep apnea    uses cpap setting of 15    Family History  Problem Relation Age of Onset   Diabetes Mother    Hypertension Mother    Hyperlipidemia Mother    Diabetes Father    Hypertension Father    Hyperlipidemia Father    Hypertension Sister    Dementia Natural Parent     Social History   Socioeconomic History   Marital status: Married    Spouse name: Not on file   Number of children: Not on file   Years of education: Not  on file   Highest education level: Not on file  Occupational History   Not on file  Tobacco Use   Smoking status: Never   Smokeless tobacco: Never  Substance and Sexual Activity   Alcohol use: No   Drug use: No   Sexual activity: Yes    Birth control/protection: Surgical  Other Topics Concern   Not on file   Social History Narrative   Not on file   Social Drivers of Health   Financial Resource Strain: Not on file  Food Insecurity: No Food Insecurity (12/22/2022)   Hunger Vital Sign    Worried About Running Out of Food in the Last Year: Never true    Ran Out of Food in the Last Year: Never true  Transportation Needs: No Transportation Needs (12/22/2022)   PRAPARE - Administrator, Civil Service (Medical): No    Lack of Transportation (Non-Medical): No  Physical Activity: Not on file  Stress: Not on file  Social Connections: Not on file  Intimate Partner Violence: Not At Risk (12/22/2022)   Humiliation, Afraid, Rape, and Kick questionnaire    Fear of Current or Ex-Partner: No    Emotionally Abused: No    Physically Abused: No    Sexually Abused: No    Past Medical History, Surgical history, Social history, and Family history were reviewed and updated as appropriate.   Please see review of systems for further details on the patient's review from today.   Objective:   Physical Exam:  There were no vitals taken for this visit.  Physical Exam Neurological:     Mental Status: She is alert and oriented to person, place, and time.     Cranial Nerves: No dysarthria.  Psychiatric:        Attention and Perception: Perception normal. She is inattentive.        Mood and Affect: Mood is anxious. Mood is not depressed. Affect is not labile or tearful.        Speech: Speech is not rapid and pressured or slurred.        Behavior: Behavior is cooperative.        Thought Content: Thought content is not paranoid or delusional. Thought content does not include homicidal or suicidal ideation. Thought content does not include suicidal plan.        Cognition and Memory: Cognition normal. Cognition is not impaired. She exhibits impaired recent memory.        Judgment: Judgment normal.     Comments: Insight intact Talkative.   No psychosis noted     Lab Review:     Component Value  Date/Time   NA 137 01/05/2024 0728   K 3.6 01/05/2024 0728   CL 108 01/05/2024 0728   CO2 23 01/05/2024 0728   GLUCOSE 94 01/05/2024 0728   GLUCOSE 154 (H) 09/01/2006 1100   BUN 19 01/05/2024 0728   CREATININE 1.07 (H) 01/05/2024 0728   CREATININE 0.93 07/19/2011 1110   CALCIUM 9.9 01/05/2024 0728   PROT 6.8 12/22/2022 0300   ALBUMIN 3.6 12/22/2022 0300   AST 19 12/22/2022 0300   ALT 15 12/22/2022 0300   ALKPHOS 76 12/22/2022 0300   BILITOT 0.7 12/22/2022 0300   GFRNONAA 56 (L) 01/05/2024 0728   GFRAA >60 03/23/2016 1933       Component Value Date/Time   WBC 7.0 01/05/2024 0728   RBC 4.02 01/05/2024 0728   HGB 12.7 01/05/2024 0728   HCT 38.7 01/05/2024 0728  PLT 172 01/05/2024 0728   MCV 96.3 01/05/2024 0728   MCH 31.6 01/05/2024 0728   MCHC 32.8 01/05/2024 0728   RDW 13.6 01/05/2024 0728   LYMPHSABS 1.2 04/15/2021 1831   MONOABS 0.4 04/15/2021 1831   EOSABS 0.1 04/15/2021 1831   BASOSABS 0.1 04/15/2021 1831   Prior lamotrigine  level in 2018 on this dosage was 3.6.  Not particularly high.  Per Dr. Mattie: Clinical Impressions: Cognitive complaints are likely due to underlying psychiatric disorder (bipolar disorder/anxiety disorder with racing thoughts). Diagnostic impressions based on test performances are limited due to the patient's severe inability to fully attend to the tasks. However, based on clinical presentation, I highly doubt the presence of a neurodegenerative dementia. It is much more likely that her racing thoughts and psychiatric disorders are interfering with cognitive   .res Assessment: Plan:    Bipolar affective disorder, mixed, severe, with psychotic behavior (HCC) - Plan: OLANZapine  (ZYPREXA ) 10 MG tablet, QUEtiapine  (SEROQUEL ) 50 MG tablet  Generalized anxiety disorder  Insomnia due to mental condition - Plan: QUEtiapine  (SEROQUEL ) 50 MG tablet  Panic disorder with agoraphobia  Mild cognitive impairment  Claustrophobia  Low vitamin D  level    [name] has chronic severe anxiety with panic attacks as well as bipolar disorder with a history of significant lability.  She has had more severe instability with psychotic sx and cognitive problems off and on lately.    No hallucinations now.  Much less paranoid and anxiety with switch to olanzapine .   She also has mild cognitive impairment as a complicating factor.  She needs her husband's assistance to manage her medications.    Discussed potential metabolic side effects associated with atypical antipsychotics, as well as potential risk for movement side effects. Advised pt to contact office if movement side effects occur.  Disc family's fear of LT SE.  However current meds work and without them sx unmanageable.  Has been able to stop BZ   Continue MVI.    Pt is chronically needy and requires extended appts DT chronic anxiety and easily stressed but much better with sertraline  low dose for anxiety. Cannot drive now and wasn't much before.    No evidence of medical cause for STM px.  No change in memory with switch from Seroquel  to Zyprexa .  FHX dementia including mother.   Pt doesn't feel able to fix her meds by herself.  Gives up quickly on task demands.    Continue lamotrigine  150 mg BID  Successful switch from Seroquel  to olanzapine  with resolution of paranoia and reduction in anxiety.  Unable to completely stop Seroquel  DT insomnia needs 50-100 mg to sleep otherwise can't sleep.   Reduce  olanzapine  10 mg HS to see if RLS is better. Would rather not have to add antoher med for RLS if possible.  Given cog concerns not ideal to start gabapentin for RLS but RLS is more of a problem lately.    FU 2 mos  Lorene Macintosh, MD, DFAPA   Future Appointments  Date Time Provider Department Center  06/27/2024  3:15 PM Gershon Donnice SAUNDERS, DPM [facility]       No orders of the defined types were placed in this encounter.      -------------------------------

## 2024-06-21 DIAGNOSIS — N183 Chronic kidney disease, stage 3 unspecified: Secondary | ICD-10-CM | POA: Diagnosis not present

## 2024-06-21 DIAGNOSIS — I129 Hypertensive chronic kidney disease with stage 1 through stage 4 chronic kidney disease, or unspecified chronic kidney disease: Secondary | ICD-10-CM | POA: Diagnosis not present

## 2024-06-21 DIAGNOSIS — I350 Nonrheumatic aortic (valve) stenosis: Secondary | ICD-10-CM | POA: Diagnosis not present

## 2024-06-21 DIAGNOSIS — E1129 Type 2 diabetes mellitus with other diabetic kidney complication: Secondary | ICD-10-CM | POA: Diagnosis not present

## 2024-06-21 DIAGNOSIS — J45909 Unspecified asthma, uncomplicated: Secondary | ICD-10-CM | POA: Diagnosis not present

## 2024-06-21 DIAGNOSIS — F319 Bipolar disorder, unspecified: Secondary | ICD-10-CM | POA: Diagnosis not present

## 2024-06-27 ENCOUNTER — Ambulatory Visit: Admitting: Podiatry

## 2024-06-28 DIAGNOSIS — I10 Essential (primary) hypertension: Secondary | ICD-10-CM | POA: Diagnosis not present

## 2024-07-13 DIAGNOSIS — N183 Chronic kidney disease, stage 3 unspecified: Secondary | ICD-10-CM | POA: Diagnosis not present

## 2024-07-13 DIAGNOSIS — E1129 Type 2 diabetes mellitus with other diabetic kidney complication: Secondary | ICD-10-CM | POA: Diagnosis not present

## 2024-07-13 DIAGNOSIS — I129 Hypertensive chronic kidney disease with stage 1 through stage 4 chronic kidney disease, or unspecified chronic kidney disease: Secondary | ICD-10-CM | POA: Diagnosis not present

## 2024-07-13 DIAGNOSIS — I350 Nonrheumatic aortic (valve) stenosis: Secondary | ICD-10-CM | POA: Diagnosis not present

## 2024-07-21 DIAGNOSIS — I129 Hypertensive chronic kidney disease with stage 1 through stage 4 chronic kidney disease, or unspecified chronic kidney disease: Secondary | ICD-10-CM | POA: Diagnosis not present

## 2024-07-21 DIAGNOSIS — F319 Bipolar disorder, unspecified: Secondary | ICD-10-CM | POA: Diagnosis not present

## 2024-07-21 DIAGNOSIS — J45909 Unspecified asthma, uncomplicated: Secondary | ICD-10-CM | POA: Diagnosis not present

## 2024-07-21 DIAGNOSIS — N183 Chronic kidney disease, stage 3 unspecified: Secondary | ICD-10-CM | POA: Diagnosis not present

## 2024-07-21 DIAGNOSIS — I350 Nonrheumatic aortic (valve) stenosis: Secondary | ICD-10-CM | POA: Diagnosis not present

## 2024-07-21 DIAGNOSIS — E1129 Type 2 diabetes mellitus with other diabetic kidney complication: Secondary | ICD-10-CM | POA: Diagnosis not present

## 2024-07-29 ENCOUNTER — Ambulatory Visit: Admitting: Podiatry

## 2024-08-06 ENCOUNTER — Other Ambulatory Visit: Payer: Self-pay | Admitting: Psychiatry

## 2024-08-06 DIAGNOSIS — F3164 Bipolar disorder, current episode mixed, severe, with psychotic features: Secondary | ICD-10-CM

## 2024-08-06 DIAGNOSIS — F5105 Insomnia due to other mental disorder: Secondary | ICD-10-CM

## 2024-08-19 ENCOUNTER — Ambulatory Visit: Admitting: Psychiatry

## 2024-08-19 ENCOUNTER — Encounter: Payer: Self-pay | Admitting: Psychiatry

## 2024-08-19 DIAGNOSIS — F5105 Insomnia due to other mental disorder: Secondary | ICD-10-CM | POA: Diagnosis not present

## 2024-08-19 DIAGNOSIS — F40240 Claustrophobia: Secondary | ICD-10-CM

## 2024-08-19 DIAGNOSIS — G2581 Restless legs syndrome: Secondary | ICD-10-CM

## 2024-08-19 DIAGNOSIS — G3184 Mild cognitive impairment, so stated: Secondary | ICD-10-CM | POA: Diagnosis not present

## 2024-08-19 DIAGNOSIS — F4001 Agoraphobia with panic disorder: Secondary | ICD-10-CM

## 2024-08-19 DIAGNOSIS — F3164 Bipolar disorder, current episode mixed, severe, with psychotic features: Secondary | ICD-10-CM

## 2024-08-19 DIAGNOSIS — F411 Generalized anxiety disorder: Secondary | ICD-10-CM

## 2024-08-19 MED ORDER — QUETIAPINE FUMARATE 50 MG PO TABS: 50.0000 mg | ORAL_TABLET | Freq: Every day | ORAL | 0 refills | Status: DC

## 2024-08-19 MED ORDER — OLANZAPINE 10 MG PO TABS: 10.0000 mg | ORAL_TABLET | Freq: Every day | ORAL | 0 refills | Status: AC

## 2024-08-19 MED ORDER — PROPRANOLOL HCL 10 MG PO TABS: ORAL_TABLET | ORAL | 0 refills | Status: AC

## 2024-08-19 MED ORDER — LAMOTRIGINE 150 MG PO TABS: 150.0000 mg | ORAL_TABLET | Freq: Two times a day (BID) | ORAL | 3 refills | Status: AC

## 2024-08-19 NOTE — Progress Notes (Signed)
 Sharde H Menendez [id number] 11-May-1951 73 y.o.   Virtual Visit via Telephone Note  I connected with pt by telephone and verified that I am speaking with the correct person using two identifiers.   I discussed the limitations, risks, security and privacy concerns of performing an evaluation and management service by telephone and the availability of in person appointments. I also discussed with the patient that there may be a patient responsible charge related to this service. The patient expressed understanding and agreed to proceed.  I discussed the assessment and treatment plan with the patient. The patient was provided an opportunity to ask questions and all were answered. The patient agreed with the plan and demonstrated an understanding of the instructions.   The patient was advised to call back or seek an in-person evaluation if the symptoms worsen or if the condition fails to improve as anticipated.  I provided 30 minutes of non-face-to-face time during this encounter.  The patient was located at home and the provider was located office.  Session 130-200   Subjective:   Patient ID:  Abbiegail H Werber is a 73 y.o. (DOB 08/30/1950) female.  Chief Complaint:  Chief Complaint  Patient presents with   Follow-up   Anxiety   Sleeping Problem   Depression   Medication Reaction    Shresta H Dickerman presents to the office today for follow-up of anxiety and depression and poor cognition.  At her visit October 12, 2018.  Because of her poor cognition which did seem to get even worse lately with her memory and given a negative unremarkable medical work-up by her primary care doctor the decision was made to change her from Xanax  which she has been on for years to lorazepam .  Lorazepam  often has less cognitive side effects than does alprazolam .  She had a lot of difficulty with headaches and insomnia with the transition.  She called several times in that transition.  Her husband reported that her  cognition was better after the transition however.  She was satisfied with the Ativan .  There was an attempt to reduce her lamotrigine  to 150 mg also in hopes of improving cognition but it was uncertain at the last visit where there she had actually done so.  visit December 2020 without med changes.  She had continued to do cognitively better off alprazolam  and on lorazepam .  seen March 2021.   Better than December visit.  Feeling good and less down.  Adjusting time of med helped excessively sleep.  Brief hypomanic episode resolved where she cleaned all night.  Sleep 10 hours . No med changes.  12/23/19 appt.  Noted: More depression, crying and racing thoughts and distressed to the point of wondering if needed the hospital.  Sleep got worse and needed Seroquel  IR 200 also bc went 2 days without sleep.  It worked and helped sleep.  Worry a lot and melancholy this week.  Cries over what happens dealing with her daugher.[name] has prostate and kidney cancer again.  Had surgery June 10, 2019 and she's cried a lot.  He's incontinent and very uncomfortable.  He usually has positive attitude.   Worry over his health and how she'll do if something happens to him.  She's afraid of Covid.  Trusting in God usually.  She realizes she  Needs to drive occasionally bc of his health.  [name] had prostate CA and lived to 90 yo.  Uncle died of it.  Not markedly depressed. Can't walk much dT plantar  fascitis. Also stressed over [name] 71 yo not doing school work and causing problems.  Her father [name] is a bad parent and not helpful.  Stressed over [employer] and downs too and they have periodic conflict.   More down and anxious.   Guilt tendency.   Patient reports difficulty with sleep initiation or maintenance in part DT anxiety and racing thoughts.Intermittent sleep problems with days and nights mixed up.  Not napping. Not manic.[name] Denies appetite disturbance.  Patient reports that energy and motivation have  been good.  Patient has some difficulty with concentration.  Chronic memory problems.   Patient denies any suicidal ideation. Plan:   No changes in meds indicated except agree to take the extra Seroquel  200 IR HS for mixed manic sx that are worse right now.  May be having mixed seasonal sx.  02/20/2020 appointment with the following noted: Doing fairly good except chronic worry over [name].  Some days cries a lot over it.  D having a hard time lately.  Still easily stressed but not more than usual. Stopped extra quetiapine  200 mg HS bc no longer needed it. Anxiety is chronic but at baseline as is depression and not manic now. H helps her take the meds.   Memory is still bad but has been worse when on Xanax  instead fo Ativan .  Sleep is fine from 10 to 9 which is much better than in the past.  Occ spells of staying up for 2 days and then needs to take quetiapine .   [name] goes for FU soon for cancer check up. Plan: no med changes  05/21/20 appt with following noted: A lot of problems.  Can't drive bc fear and anxiety.  Needed to drive H and couldn't. Fall off toilet twice. Dizzy and HA 2 days ago with a lot of crying and stress with daughter. Leaving home even coming here causes anxiety.    Cataract surgery twice. Fear of Bill being diagnosed with recurrent cancer. Chronic anxiety greather than depression.   Don't know what to do with daughter. Sleep, appetite and energy are OK. Tolerating meds. Sees [name] one day weekly. H helps with meds bc she's forgetful and easily confused.  Loses train of thoughts. Dog has separation anxiety and so they don't go out much.  Plan: no med changes  08/18/20 appt with following noted: 2 ER visitis with vertigo.  RX diazepam  and meclizine . Rarely takes former but takes latter regularly. Shakes so bad it made her cry and scream.  Started PT. Continues lorazepam  low dose 0.5 mg AM and 1.0 mg HS.  Still has dizziness and vertigo. Memory is still bad  and H administers meds with pill box. Chronic anxiety.  Depression manageable. Sleep OK and tolerates meds otherwise. Lorn driving her crazy. Plan: no med changes  11/17/20 appt with following noted: Has had periods of vertigo and meds for it.  Then had manic sx with cleaning and couldn't sleep for 24 hours. Stopped quetiapine  200 and continued quetiapine  XR 800. Also started diazepam  5 mg BID for vertigo and continued lorazepam  for anxiety.  No meclizine  now.  Vertigo stopped and manic sx stopped.  Tolerating meds now. Still feels Lorn is mean to her too often and she will cry over it. Mandy's ExH [name] in jail and is the father of Mandy's D [name] 73 yo.  Will be there for 3 mos. Not markedly depressed.  But hates herself some days.  Still memory problems about  the same.  Sleeping OK now.  Periods of mood swings. Easily anxious with relatives visiting.   Plan: Mixed manic sx resolved for now. No med changes today.  02/17/2021 appointment with the following noted: Not good.  A whole lof of everything.  Shaking for mos. It comes and goes.  Shaking at times makes her think she'll have a nervous breakdown.   Went to ER and dx vertigo.  Has tried meclizine  and Valium  but scared to take it.  Big shakes again today.  Memory is not good. Sleep to escape anxiety.  Scared to leave home. Not sure what brings on the spells of shaking. Today sx hit her as soon as she awoke.  Plan: No med changes  03/12/2021 phone call: Patient with a lot of anxiety.  Husband reported the patient had been hallucinating and was paranoid that he had left her.  Some confusion over identity of people that were around her.  It was suggested that she have medical work-up because it sounded like she had delirium and may have a UTI causing that. 03/17/2021 psychiatric hospitalization. 03/22/2021 MD response:  Note Patient has been very unstable with bipolar disorder mixed symptoms lately also with some delirium of unknown cause.   She will be seeing her primary care physician tomorrow.  Due to the severity of her symptoms it is suggested that she add haloperidol  2 mg nightly to her current quetiapine  800 mg nightly since we cannot increase the quetiapine  dose further.  This may help with her confusion and agitation.  It is okay to talk with her husband because he administers her medication and because she is too confused to understand the instructions.     04/16/21 H called with following:  Pt.'s husband called.  She is currently in [facility] waiting for an inpatient pyschiatric bed.  Pt advised her husband that Dr Geoffry told her to stop taking the Lorazepam .  He said he is not aware of that chang in her prescription.  He would like someone to call him and advise if there were any changes to her meds so he stays aware and can let the hospital know if needed. Husband was informed we had not made any medication changes and would not have suggested she abruptly stopped lorazepam  which could cause withdrawal and other kinds of problems.  05/11/2021 appointment with the following noted:  spoke with H for info At hospital was taken off Ativan , buspirone , and Seroquel  reduced to 500 mg HS (as 1 of XR 400 mg  and 1/2 of 200 mg IR quetiapine ).  Had sedative SE at 200 mg queiapinte !R.  Off risperidone  and haloperidol .   Hospitalized at [facility] for 9 days.  They didn't feel communication was good with them. But she is better now than she was.  Had been psychotic PTH.  Last 2-3 days getting more hyper and can't wind down and hard time going to sleep.  Came home 05/03/21. She didn't like the restrictions at the hospital.  No panic lately and wants something to help her sleep. Plan; Mixed manic sx including psychosis recently but not psychotic now but insomnia.. Will get more psychotic if she doesn't sleep but apparently didn't tolerate Seroquel  IR 200 mg HS well so will increase to quetiapine  IR 150 mg HS and XR 400 mg  nightly. Continue lamotrigine .    06/11/21 appt:  VEAR Peter. H thinks she's doing goood. Doing very fine.  No trouble sleeping.  Sometimes in morning  feels sad bc she knows she will have racing thoughts, but it resolves after a little while.  Racing thoughts since hospital.  Worries over money chronically. To bed 1015-7:30.  Maybe longer which is normal. H administers meds.  Some wordfinding problems.  Does journal which helps. No SE with meds.  Yesterday was hard with anxiety but today is better.  On lamotrigine  150 BID, SERoquel  XR 400 + Seroquel  IR 150 mg HS. Plan: Much better but not fully resolved.  Mixed manic sx including psychosis recently but not psychotic but insomnia last visit resolved with the following... apparently didn't tolerate Seroquel  IR 200 mg HS well so increased quetiapine  IR 150 mg HS and XR 400 mg nightly and now she is sleeping but still having some racing thoughts. Continue lamotrigine  150 BID.    07/14/2021 appointment with the following noted: Bad day yesterday feeling very depressed without anxiety. Not sleeping well lately.  Thinks it's related to less Seroquel  than in the past.  Some EMA.  Always normal slept a lot. Compulsive cleaning and everything in it's place.  Arranges soap dispensers. Cleaning in the middle of the night. No recent psychotic sx but still racing thoughts and chronic $ worries. Still afraid to leave home.  09/20/2021 appt noted: Great overall.  Occ insomnia with EMA.  Still worry but tries not let it consume me.  Still agoraphobia and hates to go out to dentists and doctors.  Challenge to go to dentist.  Can still dread things. Brief nap in daytimes. No SE H administers meds. Seroquel  300 mg HS, XR 400 mg HS.  Lamotrigine  150 Patient reports stable mood and denies depressed or irritable moods.   Patient denies difficulty with sleep initiation or maintenance. Denies appetite disturbance.  Patient reports that energy and motivation have been  good.  Patient denies any difficulty with concentration.  Patient denies any suicidal ideation.  01/20/22 appt noted: also talked with Zell Doesn't like waiting 4 mos between appts. Was manic before increasing Seroquel  with irritability, not sleeping a couple of days and excessive cleaning. Seroquel  increased to IR 400 mg HS.  No SE I feel much Better now.  Doing better at time with anxiety.   Chronic worry over D and GD. GD with psych problems. Sleeping good again. Racing thoughts are better but not gone Tolerating meds.   No further changes desired.   Plan: Seroquel  IR 400 mg HS well and XR 400 mg nightly abc recent manic sx better with increase dose. Continue lamotrigine  150 mg twice daily  04/28/2022 appointment noted: Trouble sleeping without extra 100 mg quetiapine  HS and takes XR 400 mg and IR 300 mg at 730 pm.  Otherwise will be up until 3 AM.  Sleep from 11 until 830.  Good unless anxious. Doing fairly well except 2 weeks bad spell from July 31 related to Bill receiving call from woman he worked with 8 years ago and triggered fear ans suspiciousness and insecurity and overwhelmed her.  She cried and over reacted.  Rough 2 weeks but otherwise ok. Real worried about [name] who is self cutting.  Seeing therapist and psychiatrist.  Her father is terrible and inattentive.  She is 73 yo and never happy. I still have agoraphobia and doesn't want to go ut places.  Her dog also has severe anxiety and doesn't like to be alone too.  Still fearul about money.  08/03/22 appt noted: Doing pretty well overall.  Chronic worry over money.  Met old friend on  Facebook.  She would contact Clarita a lot.  Was making her sad.  Would cry every time the woman called.  ,This was wearing her out bc woman was hyperverbal.  Cut the woman off.  Since stopped talking to her then no longer crying. When starts to get dark then racing thoughts.  Driving me crazy.  Chronic worry .  Chronic memorhy problems and tendency to be  obsessed over fear of dementia. Irritable and confused.  Might nap 30 min and rest her mind and awakens feeling better. Continues the same meds: lamotrigine  and quetiapine . Gained 20# last year. Plan: Have pushed Seroquel   as far as possible so start another mood stabilizer Trileptal  and increase to 10 mg AM and 300 mg pM  08/09/2022 phone call: She took oxcarbazepine  150 mg Friday and Saturday and developed severe abdominal pain, bowel and bladder incontinence, and impaired vision.  She skipped it on Sunday and symptoms resolved.  She took it again yesterday and the side effects returned.  States it did help with the racing thoughts but she is unable to tolerate it.  That was just on half of oxcarbazepine  tablet. MD response: DC oxcarbazepine  due to side effects.  10/17/22 appt noted: Started Monjauro today. Still racing thoughts and cleaning a lot.  More anxiety than depression.  Rough road.  Worries about everything.   Thoughts jump from one thing to another.  Can't get away from it.  Can't focus.  Hard to take instruction bc of this.    12/20/22 appt noted: Not good.  Kidney stone. More anxiety last couple of mos. Get something on her mind and she cannot get it off.  Obsesses on things that don't matter.  Trouble going to sleep.  Couldn't go into store the other day dT panic and agoraphobia.   Occ dep but nothing to worry about.  More panic.   Lost 15 # on Monjaro.   Plan start sertraline  for anxiety  03/03/23 appt noted: TC Been hosp for kidney px kidney stone.  Glad to be over it.  .   Lost 42 # since April 1 with Monjauro.  Eating better.   Anxiety is better with sertraline  50 mg marked benefit with very little anxiety.  Much happier and less crying.  Better acceptance and self talk better than ever before.   Still some dep which interferes with function at times but not as severe. I'm doing ok.   Tolerating psych meds without problems.  05/03/23 appt noted:  Psych med: lamotrigine   150 BID, Quetiapine  ER 400 mg pm and IR 300-400 mg HS, sertraline  50 for anxiety. Real sleepy today after taking quetiapine  200 mg , more than usual.  Only takes it prn.  Usually getting enough sleep.  But sometimes keyed up and can't go to sleep.   Been feeling good overall.  Occ bad days.  For the most part doing fine.   Does thrifting which lifts her spirits about 1 time weekly.   It takes her mind off worries.   No marked dep and anxiety is manageable. Lost 55#.   Doing fine with Bill.   [name] 72 yo.  Quiet girl. No med change desired.  07/05/23 appt noted:  In person  Had episode of anxiety with severe worry and obsessing over money at times and bad  episode a couple of weeks ago. Lost 63# on Monjaro.  SE hair thinning.  Brittle nails. Can report many potential SE.  Better mobility now.  Very nice  emotionally.   Now 212# and goal is 199#.   Blood sugar runs in 70's fasting.   No insulin .  Exercising too.   No sig dep.  I don't have dep like I did years ago but have bad anxiety.  Still can't drive with anxiety and memory px.   Sleep at night is better.  STM is so bad.   H prepares meds.  Forgets how to do tasks at times.   Terrible anxiety.   Psych med: lamotrigine  150 BID, Quetiapine  ER 400 mg pm and IR 300-400 mg HS, sertraline  50 for anxiety. Uses routine to help with memory.   Plan: Trial low dose propranolol  10-20 mg BID prn anxiety bc it helped D's anxiety.    09/06/23 apppt noted: Psych med: lamotrigine  150 BID, Quetiapine  ER 400 mg pm and IR 300-400 mg HS, sertraline  50 for anxiety. Tried propranolol  for anxiety up to 20 BID.  It does help some.  Will get stubborn and not take it.   Had a lot anxiety and trouble sleeping.  When bad I clean like the devil.  Only 1 hour sleep last night.    Hard to learn new things since childhood like technology.  Can't use tablet without his help.   Last night took extra 100 mg Seroquel  and didn't help. No trigger for more anxiety,  tension, scared.  Even real nervous before MD called.   Lost 62#  on Mounjaro.  Feels better physically.   Some people tell her she has dementia.    Worried Seroquel  might cause that.  No clear pcpt bc long term memory problems.  Can't deal with thinking I might have dementia.   Chronic anxiety since childhood.   Has a friend who calls and a lot and chronically is negative and complains all the time.  Sleep worse for 2-3 weeks.   Plan: DC sertraline  and start mirtazapine  15 mg HS for sleep and anxiety  11/15/23 appt noted:  [name] on phone call Psych med: lamotrigine  150 BID, Quetiapine  ER 400 mg pm and IR 300-400 mg HS, mirtazapine  15 HS Tried propranolol  for anxiety up to 20 BID.  Having dep and anxiety bad.  Sleeping is terrrible.  Went up to 3 days without sleep.  About 2 weeks. No trigger.  Cannot go to sTrying to adapt to it and tolerate it.   Trouble focusing and memory issues worse.  Gets easily overwhelmed and stimulated.  Will get lost in stores and large areas. H notices.  Episodic cleaning episodes over the top.   Loses train of thought Plan: Stop  mirtazapine  DT NR Increase Seroquel  XR 400 mg and add additional IR Seroquel  500 mg HS  12/22/23 APPT NOTED: with Bill on phone call Med: lamotrigine  150 BID, Quetiapine  ER 400 mg pm and IR 500 mg HS,  Very well .  The med change helped.  Sleep is better with some brief awakening.   The med change very helpful.  Usually asleep in 45 mins.   No SE other than sleepiness.  But pleased with med changes. Not dep.  Not as anxious.  Cooking again and wasn't before.    01/19/24 urgent appt: Bill on call Pt called yesterday complaining of increased distress and hallucinations and paranoia.  Med: lamotrigine  150 BID, Quetiapine  ER 400 mg pm and IR 500 mg HS,  Bill said I was imagining things.  I felt like I was having delusions.  Was emotional yesterday.  Lost 9# in a couple  of weeks bc couldn't eat.  Has been negative real bad.   Bill reports  she thought he was talking to people on the phone.   Was in ER after fall on 01/12/24.  Passed out with dizziness and N and couldn't eat.  Had MRI an CT scan.   Stressed from $ expense.  Bill fell a week before that.  Worried over how we are going to make it; worrying about Bill.  He has leg wound with cellulitis.  Not getting better but is seeing doctor. Drinks plenty of fluids, but only Sprite Zero.   Dizziness resolved off ezetimibe . General fearfulness yesterday, I couldn't stop it.   Complains of seeing things that aren't actually happening; that he's on the phone to other women.  Wake during the night thinking about it.  Can't eat bc can't get that fear out of my mind.  Then says sleeping good.  The fear is so bad.  Had a spell like this in 1983. Plan: Reduce quetiapine  to XR 400 mg pm with quetiapine  100 mg IR HS In it's place retry olanzapine  10 mg HS.  Will try to transition off quetiapine  but needs to be done gradually.  02/22/24 appt noted;  with Bill Med: Lamotrigine  150 mg twice daily, OFF metoprolol  XL 50 mg daily, propranolol  20 mg twice daily PRN Anxiety, olanzapine  15 mg nightly, quetiapine  ER 200 + IR 50-100 mg HS Sleep better. But pain interferes.  Likes to sleep to escape her anxiety.   Been to a lot of doctors for diffuse body pain interfering with sleep. I still have delusions but able to work through them fairly well.  Mind is better but every day is different.  Not 100%.  Can tell a difference between anxiety and depression.   No SE.  Her PCP doesn't agree with her psych meds.   Chronic memory issues.  Trouble doing complicated things like operating cell phone. Had MRI Chronically worries about family and esp about H's health and his future.  No longer excessive cleaning nor other manic sx.   Appreciates her psychiatric care and glad to try something else besides quetiapine .  H agrees she's less anxious and hyper and not generally paranoid.  But still periods of confusion  and pending neuro appt.   Plan: Disc potential hospitalization for paranoia and severe anxiety and difficulty eating.  Will try to prevent it by starting process of switching to olanzapine . Reduce quetiapine  to  IR 150 mg HS and stop XR.  Will try to transition off quetiapine  but needs to be done gradually. Continue olanzapine  15 mg HS  04/18/24 appt noted:  with Bill Med:  Lamotrigine  150 mg twice daily,  propranolol  20 mg twice daily PRN Anxiety, olanzapine  15 mg nightly, quetiapine   IR 100 mg HS Doing fine.  Sleep fine with current dose of meds except arthritic pain interferes with sleep.  Usually sleeps until 1030.   Lately anxiety fine.  But worry about children.  Sometimes intrusive or paranoid thoughts but able to shake it off.   She is doing better with switch to olanzapine .   Ongoing memory problems.  PCP referring her for neurology in October.  Some anxiety about that.   No SE with meds.   Bill agrees better with olanzapine .  Not paranoid.   Plan: Continue lamotrigine  150 mg BID Successful switch from Seroquel  to olanzapine  with resolution of paranoia and reduction in anxiety.  Unable to completely stop Seroquel  DT insomnia.  Continue olanzapine  15  mg HS  06/20/24 appt noted: with Bill Med:  Lamotrigine  150 mg twice daily,  propranolol  20 mg twice daily PRN Anxiety, olanzapine  15 mg nightly, quetiapine   IR 100 mg HS Didn't work out bc too much of quetiapine  100 mg DT sedating.  Usually doing ok with quetiapine  50 mg HS.   No SE with 50 mg quetiapine .  PCP Dr. Leonel wants her to see neurologist to see if she has dementia.   She RX donepezil  5 mg and H concerned bc pt already has N and diarrhea and the med can make it worse.   I have good days and bad days but nothing major is different .  Same old stuff.   RLS is ongoing and affects her sleep.  Been a problems the last couple of days.  Avg 5 days per week.   If takes 2 Seroquel  then RLS is worse.  Sometimes will dread going to  sleep.  Used to take Seroquel  without RLS.  No sig leg cramps.   Plan: Reduce  olanzapine  10 mg HS to see if RLS is better.  08/19/24 appt noted: with Bill Med:  Lamotrigine  150 mg twice daily,  propranolol  20 mg twice daily PRN Anxiety, red to olanzapine  10 mg nightly, quetiapine   IR 50-100 mg HS She's done fine with red olanzapine  as far as she knows and Bill agrees.   Sleeep erratic from too little to too much.  Not sure why it is happening.   Had some dep without reason.   Got nervous knowing I was calling which is unususal and she doesn't kno wwhy.   Some dep px yesterday without anxiety but varies and doesn't know why. Sees PCP Dr Leonel early Feb.  Previously dx RLS and doesn't want to take more.   Naps in the morning.   RLS doesn't bother her on couch but does in the bed intermittently. Last 2 days with dep EFA.   Might sleep until 230 in the afternoon often if not enough at night.   Memory still getting worse without reason.  Concerns her.  Doesn't want to know if she has dementia but reportedly stable neuropsych testing over the last 5 years.   Will lose thoughts.  Neither kids nor H complain about her memory.   Sill afraid to go out places.   No sig caffeine.   Overall thinks she's doing well with the meds.      Prior psychiatric medication trials include  paroxetine , Lexapro, sertraline  50 Sertraline  50 daily.    risperidone  4 mg which was sedating, aripiprazole,  perphenazine,  Seroquel  1000,  olanzapine  15 Lamotrigine  300.    lithium which was not tolerated even at very low dosages.  Oxcarbazepine  150 mg bowel and bladder incontinence and impaired vision. Topamax for anxiety,  She was intolerant of both Namenda and Aricept .    Xanax , Ativan , diazepam  5 mg BID    Propranolol  20 prn helped anxiety  This is not an exhaustive list.   Psych hospitalization 04/2021 at [facility] for psychosis.  At the visit in February 2020 Deona was more acutely confused  recently for no clear reason.   She had a negative medical work-up for causes of short-term memory problems and confusion.  Therefore the decision was made to try switching her from alprazolam  to lorazepam  at the lowest possible dose in hopes of improving her cognition.  This was successful and her cognition was improved significantly and her husband verified this.    GD  [name] seeing Dr. Vicenta D noted sig anxiety benefit with propranolol   Neuropsych testing mid 2019 at [facility] but results not on chart.    Review of Systems:  Review of Systems  Eyes:  Positive for visual disturbance.  Cardiovascular:  Negative for chest pain and palpitations.  Gastrointestinal:  Positive for constipation and diarrhea.  Musculoskeletal:  Positive for arthralgias, back pain and gait problem.  Neurological:  Positive for dizziness. Negative for weakness.  Psychiatric/Behavioral:  Positive for confusion and decreased concentration. Negative for agitation, behavioral problems, dysphoric mood, hallucinations, self-injury, sleep disturbance and suicidal ideas. The patient is nervous/anxious. The patient is not hyperactive.     Medications: I have reviewed the patient's current medications.  Current Outpatient Medications  Medication Sig Dispense Refill   acetaminophen  (TYLENOL ) 325 MG tablet Take 650 mg by mouth every 6 (six) hours as needed for mild pain or headache.     amLODipine  (NORVASC ) 5 MG tablet Take 5 mg by mouth every evening.     aspirin  81 MG tablet Take 81 mg by mouth daily.     famotidine  (PEPCID ) 20 MG tablet Take 20 mg by mouth in the morning and at bedtime.     furosemide  (LASIX ) 20 MG tablet Take 20 mg by mouth daily as needed (for severe swelling).     levocetirizine (XYZAL ) 5 MG tablet Take 5 mg by mouth every evening.     levothyroxine  (SYNTHROID ) 100 MCG tablet Take 100 mcg by mouth daily before breakfast.     lisinopril  (ZESTRIL ) 20 MG tablet Take 1 tablet (20 mg total) by mouth daily.      meloxicam  (MOBIC ) 15 MG tablet Take 15 mg by mouth daily.     metoprolol  succinate (TOPROL -XL) 50 MG 24 hr tablet Take 50 mg by mouth daily.     Multiple Vitamin (MULTIVITAMIN) tablet Take 1 tablet by mouth daily.     polyethylene glycol (MIRALAX  / GLYCOLAX ) packet Take 17 g by mouth 2 (two) times daily as needed for mild constipation.     potassium chloride  SA (K-DUR,KLOR-CON ) 20 MEQ tablet Take 20 mEq by mouth daily.     simvastatin  (ZOCOR ) 40 MG tablet Take 1 tablet (40 mg total) by mouth every evening. 30 tablet 0   tirzepatide (MOUNJARO) 12.5 MG/0.5ML Pen Inject 12.5 mg into the skin once a week.     cephALEXin  (KEFLEX ) 250 MG capsule Take 250 mg by mouth 2 (two) times daily. (Patient not taking: Reported on 08/19/2024)     ezetimibe  (ZETIA ) 10 MG tablet Take 1 tablet (10 mg total) by mouth daily. (Patient not taking: Reported on 06/07/2024) 90 tablet 3   lamoTRIgine  (LAMICTAL ) 150 MG tablet Take 1 tablet (150 mg total) by mouth 2 (two) times daily. 180 tablet 3   OLANZapine  (ZYPREXA ) 10 MG tablet Take 1 tablet (10 mg total) by mouth at bedtime. 90 tablet 0   propranolol  (INDERAL ) 10 MG tablet TAKE 1 TO 2 TABLETS BY MOUTH TWICE A DAY AS NEEDED FOR ANXIETY 360 tablet 0   QUEtiapine  (SEROQUEL ) 50 MG tablet Take 1 tablet (50 mg total) by mouth at bedtime. 90 tablet 0   No current facility-administered medications for this visit.    Medication Side Effects: None  Allergies:  Allergies  Allergen Reactions   Maxzide [Triamterene-Hctz] Other (See Comments)    Hurt patient's kidneys   Celexa [Citalopram Hydrobromide] Nausea Only   Metformin And Related Nausea And Vomiting   Other Nausea And Vomiting and Other (See Comments)  Unnamed medication hurt the patient's kidneys   Reglan  [Metoclopramide ] Nausea Only   Risperdal  [Risperidone ] Nausea Only and Other (See Comments)    Also caused hallucinations   Trazodone And Nefazodone Nausea Only   Zestril  [Lisinopril ] Nausea Only   Lithium  Palpitations and Other (See Comments)    Shakes and delusional pt started seeing things      Past Medical History:  Diagnosis Date   Anxiety    Arthritis    Asthma    Bipolar 1 disorder (HCC)    CHF (congestive heart failure) (HCC)    Complication of anesthesia    left a bad taste in my mouth it has lasted since january    Depression    Diabetes mellitus    Hernia    umbilical   Hyperlipidemia    Hypertension    Hypothyroidism    Pericarditis, viral 2010 October   Sleep apnea    uses cpap setting of 15    Family History  Problem Relation Age of Onset   Diabetes Mother    Hypertension Mother    Hyperlipidemia Mother    Diabetes Father    Hypertension Father    Hyperlipidemia Father    Hypertension Sister    Dementia Natural Parent     Social History   Socioeconomic History   Marital status: Married    Spouse name: Not on file   Number of children: Not on file   Years of education: Not on file   Highest education level: Not on file  Occupational History   Not on file  Tobacco Use   Smoking status: Never   Smokeless tobacco: Never  Substance and Sexual Activity   Alcohol use: No   Drug use: No   Sexual activity: Yes    Birth control/protection: Surgical  Other Topics Concern   Not on file  Social History Narrative   Not on file   Social Drivers of Health   Tobacco Use: Low Risk (08/19/2024)   Patient History    Smoking Tobacco Use: Never    Smokeless Tobacco Use: Never    Passive Exposure: Not on file  Financial Resource Strain: Not on file  Food Insecurity: No Food Insecurity (12/22/2022)   Hunger Vital Sign    Worried About Running Out of Food in the Last Year: Never true    Ran Out of Food in the Last Year: Never true  Transportation Needs: No Transportation Needs (12/22/2022)   PRAPARE - Administrator, Civil Service (Medical): No    Lack of Transportation (Non-Medical): No  Physical Activity: Not on file  Stress: Not on file   Social Connections: Not on file  Intimate Partner Violence: Not At Risk (12/22/2022)   Humiliation, Afraid, Rape, and Kick questionnaire    Fear of Current or Ex-Partner: No    Emotionally Abused: No    Physically Abused: No    Sexually Abused: No  Depression (PHQ2-9): Not on file  Alcohol Screen: Not on file  Housing: Low Risk (12/22/2022)   Housing    Last Housing Risk Score: 0  Utilities: Not At Risk (12/22/2022)   AHC Utilities    Threatened with loss of utilities: No  Health Literacy: Not on file    Past Medical History, Surgical history, Social history, and Family history were reviewed and updated as appropriate.   Please see review of systems for further details on the patient's review from today.   Objective:   Physical Exam:  There  were no vitals taken for this visit.  Physical Exam Neurological:     Mental Status: She is alert and oriented to person, place, and time.     Cranial Nerves: No dysarthria.  Psychiatric:        Attention and Perception: Perception normal. She is inattentive.        Mood and Affect: Mood is anxious. Mood is not depressed. Affect is not labile or tearful.        Speech: Speech is not rapid and pressured or slurred.        Behavior: Behavior is cooperative.        Thought Content: Thought content is not paranoid or delusional. Thought content does not include homicidal or suicidal ideation. Thought content does not include suicidal plan.        Cognition and Memory: Cognition normal. Cognition is not impaired. She exhibits impaired recent memory.        Judgment: Judgment normal.     Comments: Insight intact Talkative.   No psychosis noted No problems with less olanzapine      Lab Review:     Component Value Date/Time   NA 137 01/05/2024 0728   K 3.6 01/05/2024 0728   CL 108 01/05/2024 0728   CO2 23 01/05/2024 0728   GLUCOSE 94 01/05/2024 0728   GLUCOSE 154 (H) 09/01/2006 1100   BUN 19 01/05/2024 0728   CREATININE 1.07 (H)  01/05/2024 0728   CREATININE 0.93 07/19/2011 1110   CALCIUM 9.9 01/05/2024 0728   PROT 6.8 12/22/2022 0300   ALBUMIN 3.6 12/22/2022 0300   AST 19 12/22/2022 0300   ALT 15 12/22/2022 0300   ALKPHOS 76 12/22/2022 0300   BILITOT 0.7 12/22/2022 0300   GFRNONAA 56 (L) 01/05/2024 0728   GFRAA >60 03/23/2016 1933       Component Value Date/Time   WBC 7.0 01/05/2024 0728   RBC 4.02 01/05/2024 0728   HGB 12.7 01/05/2024 0728   HCT 38.7 01/05/2024 0728   PLT 172 01/05/2024 0728   MCV 96.3 01/05/2024 0728   MCH 31.6 01/05/2024 0728   MCHC 32.8 01/05/2024 0728   RDW 13.6 01/05/2024 0728   LYMPHSABS 1.2 04/15/2021 1831   MONOABS 0.4 04/15/2021 1831   EOSABS 0.1 04/15/2021 1831   BASOSABS 0.1 04/15/2021 1831   Prior lamotrigine  level in 2018 on this dosage was 3.6.  Not particularly high.  Per Dr. Mattie: Clinical Impressions: Cognitive complaints are likely due to underlying psychiatric disorder (bipolar disorder/anxiety disorder with racing thoughts). Diagnostic impressions based on test performances are limited due to the patient's severe inability to fully attend to the tasks. However, based on clinical presentation, I highly doubt the presence of a neurodegenerative dementia. It is much more likely that her racing thoughts and psychiatric disorders are interfering with cognitive   .res Assessment: Plan:    Bipolar affective disorder, mixed, severe, with psychotic behavior (HCC) - Plan: QUEtiapine  (SEROQUEL ) 50 MG tablet, OLANZapine  (ZYPREXA ) 10 MG tablet, lamoTRIgine  (LAMICTAL ) 150 MG tablet  Generalized anxiety disorder - Plan: propranolol  (INDERAL ) 10 MG tablet  Panic disorder with agoraphobia - Plan: propranolol  (INDERAL ) 10 MG tablet  Insomnia due to mental condition - Plan: QUEtiapine  (SEROQUEL ) 50 MG tablet  Mild cognitive impairment  Claustrophobia  Restless legs syndrome (RLS)   Romero has chronic severe anxiety with panic attacks as well as bipolar disorder with a  history of significant lability.  She has had more severe instability with psychotic sx and cognitive problems off and  on lately.    No hallucinations now.  Much less paranoid and anxiety with switch to olanzapine .   She also has mild cognitive impairment as a complicating factor.  She needs her husband's assistance to manage her medications.    Discussed potential metabolic side effects associated with atypical antipsychotics, as well as potential risk for movement side effects. Advised pt to contact office if movement side effects occur.  Disc family's fear of LT SE.  However current meds work and without them sx unmanageable.  Has been able to stop BZ   Continue MVI.    Pt is chronically needy and requires extended appts DT chronic anxiety and easily stressed but much better with sertraline  low dose for anxiety. Cannot drive now and wasn't much before.    No evidence of medical cause for STM px.  No change in memory with switch from Seroquel  to Zyprexa .  FHX dementia including mother.   Pt doesn't feel able to fix her meds by herself.  Gives up quickly on task demands.    Continue lamotrigine  150 mg BID  Successful switch from Seroquel  to olanzapine  with resolution of paranoia and reduction in anxiety.  Unable to completely stop Seroquel  DT insomnia needs 50-100 mg to sleep otherwise can't sleep.   Reduced  olanzapine  10 mg HS to see if RLS is better but apparently no change.   But also emotional state nor anxiety worse so no changes.. Would rather not have to add antoher med for RLS if possible.  Given cog concerns not ideal to start gabapentin for RLS but RLS is more of a problem lately.  Consider .  She will discuss with PCP soon.  FU 2 mos  Lorene Macintosh, MD, DFAPA   Future Appointments  Date Time Provider Department Center  08/29/2024  2:45 PM Gershon Donnice SAUNDERS, DPM [facility]       No orders of the defined types were placed in this encounter.       -------------------------------

## 2024-08-29 ENCOUNTER — Ambulatory Visit: Admitting: Podiatry

## 2024-08-29 DIAGNOSIS — M79675 Pain in left toe(s): Secondary | ICD-10-CM

## 2024-08-29 DIAGNOSIS — M79674 Pain in right toe(s): Secondary | ICD-10-CM | POA: Diagnosis not present

## 2024-08-29 DIAGNOSIS — B351 Tinea unguium: Secondary | ICD-10-CM | POA: Diagnosis not present

## 2024-08-30 NOTE — Progress Notes (Signed)
 Subjective:   Patient ID: [name], female   DOB: 74 y.o.   MRN: [id number]   HPI Chief Complaint  Patient presents with   [facility]    Patient presents today for [facility] patient relates no changes since last medical visit      74 year old female presents the office today with above concerns.  She states her nails are thick elongated she cannot do them herself and they become painful. No open lesions. No new concerns.      Objective:  Physical Exam  General: AAO x3, NAD  Dermatological: Nails are hypertrophic, dystrophic, brittle, discolored, elongated 10. No surrounding redness or drainage. Tenderness nails 1-5 bilaterally. No open lesions.   Vascular: Dorsalis Pedis artery and Posterior Tibial artery pedal pulses are 2/4 bilateral with immedate capillary fill time. There is no pain with calf compression, swelling, warmth, erythema.   Neruologic: Sensation decreased with Semmes Weinstein monofilament.  Musculoskeletal: No other areas of discomfort.   Gait: Unassisted, Nonantalgic.       Assessment:   Symptomatic onychomycosis     Plan:  -Treatment options discussed including all alternatives, risks, and complications -Etiology of symptoms were discussed -Sharply debrided nails x 10 to any complications or bleeding -Daily foot inspection  Return in about 2 months (around 10/29/2024).   Donnice JONELLE Fees DPM

## 2024-09-09 ENCOUNTER — Other Ambulatory Visit: Payer: Self-pay | Admitting: Psychiatry

## 2024-09-09 ENCOUNTER — Telehealth: Payer: Self-pay | Admitting: Psychiatry

## 2024-09-09 DIAGNOSIS — F5105 Insomnia due to other mental disorder: Secondary | ICD-10-CM

## 2024-09-09 DIAGNOSIS — F3164 Bipolar disorder, current episode mixed, severe, with psychotic features: Secondary | ICD-10-CM

## 2024-09-09 MED ORDER — QUETIAPINE FUMARATE 50 MG PO TABS: 50.0000 mg | ORAL_TABLET | Freq: Every day | ORAL | 0 refills | Status: AC

## 2024-09-09 NOTE — Telephone Encounter (Addendum)
 Husband called reporting last Rx sent to [facility] was for 1-50 mg tablet #90 of Quetiapine .  Pt takes (she takes 2 nightly, he stated) 50 mg tablets at night. Will run out in couple of weeks. Requesting new Rx for #180 for 1-2 50 mg tablets @ night. Apt  2/26

## 2024-09-09 NOTE — Telephone Encounter (Signed)
 Husband called about quetiapine . He reports patient takes 50 mg, takes 1 at 7:30 and if she can't get to sleep takes another at 9:30. He said she almost always takes two. Last Rx sent to [facility] was for 50 mg, 1 tablet. H said she is almost out. Will send new Rx as appropriate.   From 12/29 visit:  Successful switch from Seroquel  to olanzapine  with resolution of paranoia and reduction in anxiety.  Unable to completely stop Seroquel  DT insomnia needs 50-100 mg to sleep otherwise can't sleep

## 2024-09-09 NOTE — Telephone Encounter (Signed)
Sent update.

## 2024-09-11 ENCOUNTER — Other Ambulatory Visit: Payer: Self-pay | Admitting: Psychiatry

## 2024-09-11 DIAGNOSIS — F3164 Bipolar disorder, current episode mixed, severe, with psychotic features: Secondary | ICD-10-CM

## 2024-10-17 ENCOUNTER — Telehealth: Admitting: Psychiatry

## 2024-10-31 ENCOUNTER — Ambulatory Visit: Admitting: Podiatry
